# Patient Record
Sex: Female | Born: 1950
Health system: Southern US, Community
[De-identification: ages and names within clinical notes are randomized; demographics above are authoritative.]

## PROBLEM LIST (undated history)

## (undated) DIAGNOSIS — K529 Noninfective gastroenteritis and colitis, unspecified: Secondary | ICD-10-CM

## (undated) DIAGNOSIS — G4730 Sleep apnea, unspecified: Secondary | ICD-10-CM

## (undated) DIAGNOSIS — E079 Disorder of thyroid, unspecified: Secondary | ICD-10-CM

## (undated) DIAGNOSIS — K589 Irritable bowel syndrome without diarrhea: Secondary | ICD-10-CM

## (undated) DIAGNOSIS — T4145XA Adverse effect of unspecified anesthetic, initial encounter: Secondary | ICD-10-CM

## (undated) DIAGNOSIS — K219 Gastro-esophageal reflux disease without esophagitis: Secondary | ICD-10-CM

## (undated) DIAGNOSIS — E119 Type 2 diabetes mellitus without complications: Secondary | ICD-10-CM

## (undated) DIAGNOSIS — F32A Depression, unspecified: Secondary | ICD-10-CM

## (undated) DIAGNOSIS — F419 Anxiety disorder, unspecified: Secondary | ICD-10-CM

## (undated) DIAGNOSIS — J45909 Unspecified asthma, uncomplicated: Secondary | ICD-10-CM

## (undated) DIAGNOSIS — M542 Cervicalgia: Secondary | ICD-10-CM

## (undated) DIAGNOSIS — Z9889 Other specified postprocedural states: Secondary | ICD-10-CM

## (undated) DIAGNOSIS — R51 Headache: Secondary | ICD-10-CM

## (undated) DIAGNOSIS — G2581 Restless legs syndrome: Secondary | ICD-10-CM

## (undated) DIAGNOSIS — M1990 Unspecified osteoarthritis, unspecified site: Secondary | ICD-10-CM

## (undated) DIAGNOSIS — T8859XA Other complications of anesthesia, initial encounter: Secondary | ICD-10-CM

## (undated) DIAGNOSIS — G8929 Other chronic pain: Secondary | ICD-10-CM

## (undated) DIAGNOSIS — E785 Hyperlipidemia, unspecified: Secondary | ICD-10-CM

## (undated) DIAGNOSIS — I209 Angina pectoris, unspecified: Secondary | ICD-10-CM

## (undated) DIAGNOSIS — R112 Nausea with vomiting, unspecified: Secondary | ICD-10-CM

## (undated) DIAGNOSIS — M549 Dorsalgia, unspecified: Secondary | ICD-10-CM

## (undated) DIAGNOSIS — F329 Major depressive disorder, single episode, unspecified: Secondary | ICD-10-CM

## (undated) HISTORY — DX: Noninfective gastroenteritis and colitis, unspecified: K52.9

## (undated) HISTORY — PX: HERNIA REPAIR: SHX51

## (undated) HISTORY — PX: ABDOMINAL HYSTERECTOMY: SHX81

## (undated) HISTORY — PX: SPINE SURGERY: SHX786

## (undated) HISTORY — DX: Irritable bowel syndrome, unspecified: K58.9

## (undated) HISTORY — PX: DILATION AND CURETTAGE OF UTERUS: SHX78

## (undated) HISTORY — PX: BILATERAL CARPAL TUNNEL RELEASE: SHX6508

## (undated) HISTORY — PX: KNEE SURGERY: SHX244

## (undated) HISTORY — DX: Restless legs syndrome: G25.81

## (undated) HISTORY — PX: CHOLECYSTECTOMY: SHX55

## (undated) HISTORY — DX: Sleep apnea, unspecified: G47.30

## (undated) HISTORY — DX: Disorder of thyroid, unspecified: E07.9

## (undated) HISTORY — PX: CARDIAC CATHETERIZATION: SHX172

---

## 2004-10-16 ENCOUNTER — Ambulatory Visit: Payer: Self-pay | Admitting: Family Medicine

## 2004-12-19 ENCOUNTER — Inpatient Hospital Stay: Payer: Self-pay | Admitting: Internal Medicine

## 2004-12-19 ENCOUNTER — Other Ambulatory Visit: Payer: Self-pay

## 2005-07-04 ENCOUNTER — Ambulatory Visit: Payer: Self-pay | Admitting: Family Medicine

## 2005-11-13 ENCOUNTER — Emergency Department: Payer: Self-pay | Admitting: Emergency Medicine

## 2006-01-16 ENCOUNTER — Emergency Department: Payer: Self-pay | Admitting: Emergency Medicine

## 2006-09-07 ENCOUNTER — Emergency Department: Payer: Self-pay | Admitting: Emergency Medicine

## 2006-09-07 ENCOUNTER — Other Ambulatory Visit: Payer: Self-pay

## 2006-10-27 ENCOUNTER — Ambulatory Visit: Payer: Self-pay

## 2007-04-22 ENCOUNTER — Emergency Department: Payer: Self-pay | Admitting: Emergency Medicine

## 2007-05-21 ENCOUNTER — Ambulatory Visit: Payer: Self-pay | Admitting: Family Medicine

## 2007-06-03 ENCOUNTER — Ambulatory Visit: Payer: Self-pay | Admitting: Gastroenterology

## 2007-06-25 ENCOUNTER — Ambulatory Visit: Payer: Self-pay | Admitting: Unknown Physician Specialty

## 2008-04-27 ENCOUNTER — Ambulatory Visit: Payer: Self-pay | Admitting: Unknown Physician Specialty

## 2008-04-28 ENCOUNTER — Inpatient Hospital Stay: Payer: Self-pay | Admitting: Unknown Physician Specialty

## 2008-05-20 ENCOUNTER — Ambulatory Visit: Payer: Self-pay | Admitting: Unknown Physician Specialty

## 2008-06-14 ENCOUNTER — Emergency Department: Payer: Self-pay | Admitting: Emergency Medicine

## 2009-04-27 ENCOUNTER — Ambulatory Visit: Payer: Self-pay | Admitting: Family Medicine

## 2009-05-02 ENCOUNTER — Ambulatory Visit: Payer: Self-pay | Admitting: Family Medicine

## 2009-08-15 ENCOUNTER — Ambulatory Visit: Payer: Self-pay | Admitting: Unknown Physician Specialty

## 2009-08-21 ENCOUNTER — Ambulatory Visit: Payer: Self-pay | Admitting: Unknown Physician Specialty

## 2009-12-02 ENCOUNTER — Ambulatory Visit: Payer: Self-pay | Admitting: Unknown Physician Specialty

## 2009-12-08 ENCOUNTER — Ambulatory Visit: Payer: Self-pay | Admitting: Family Medicine

## 2010-03-18 LAB — HM COLONOSCOPY

## 2010-05-16 ENCOUNTER — Observation Stay: Payer: Self-pay | Admitting: Internal Medicine

## 2010-07-01 ENCOUNTER — Emergency Department: Payer: Self-pay | Admitting: Emergency Medicine

## 2010-07-16 ENCOUNTER — Emergency Department: Payer: Self-pay | Admitting: Emergency Medicine

## 2010-11-07 ENCOUNTER — Other Ambulatory Visit: Payer: Self-pay | Admitting: Orthopedic Surgery

## 2010-11-07 ENCOUNTER — Other Ambulatory Visit (HOSPITAL_COMMUNITY): Payer: Self-pay | Admitting: Orthopedic Surgery

## 2010-11-07 ENCOUNTER — Encounter (HOSPITAL_COMMUNITY)

## 2010-11-07 ENCOUNTER — Ambulatory Visit (HOSPITAL_COMMUNITY)
Admission: RE | Admit: 2010-11-07 | Discharge: 2010-11-07 | Disposition: A | Discharge status: Home / Self Care | Source: Ambulatory Visit | Attending: Orthopedic Surgery | Admitting: Orthopedic Surgery

## 2010-11-07 DIAGNOSIS — Z01811 Encounter for preprocedural respiratory examination: Secondary | ICD-10-CM | POA: Insufficient documentation

## 2010-11-07 DIAGNOSIS — Z01812 Encounter for preprocedural laboratory examination: Secondary | ICD-10-CM | POA: Insufficient documentation

## 2010-11-07 DIAGNOSIS — Z01810 Encounter for preprocedural cardiovascular examination: Secondary | ICD-10-CM | POA: Insufficient documentation

## 2010-11-07 DIAGNOSIS — Z01818 Encounter for other preprocedural examination: Secondary | ICD-10-CM

## 2010-11-07 LAB — CBC
HCT: 37.9 % (ref 36.0–46.0)
MCHC: 32.7 g/dL (ref 30.0–36.0)
MCV: 80.3 fL (ref 78.0–100.0)
Platelets: 219 10*3/uL (ref 150–400)
RDW: 15.8 % — ABNORMAL HIGH (ref 11.5–15.5)
WBC: 10.1 10*3/uL (ref 4.0–10.5)

## 2010-11-07 LAB — BASIC METABOLIC PANEL
BUN: 19 mg/dL (ref 6–23)
Creatinine, Ser: 0.93 mg/dL (ref 0.50–1.10)
GFR calc Af Amer: >60 mL/min (ref >60)
GFR calc non Af Amer: >60 mL/min (ref >60)
Glucose, Bld: 241 mg/dL — ABNORMAL HIGH (ref 70–99)

## 2010-11-07 LAB — URINALYSIS, ROUTINE W REFLEX MICROSCOPIC
Hgb urine dipstick: NEGATIVE
Nitrite: NEGATIVE
Urobilinogen, UA: 0.2 mg/dL (ref 0.0–1.0)

## 2010-11-07 LAB — SURGICAL PCR SCREEN: MRSA, PCR: NEGATIVE

## 2010-11-07 LAB — DIFFERENTIAL
Eosinophils Absolute: 0.7 10*3/uL (ref 0.0–0.7)
Eosinophils Relative: 7 % — ABNORMAL HIGH (ref 0–5)
Lymphs Abs: 2.4 10*3/uL (ref 0.7–4.0)
Monocytes Absolute: 0.4 10*3/uL (ref 0.1–1.0)

## 2010-11-07 LAB — PROTIME-INR
INR: 0.93 (ref 0.00–1.49)
Prothrombin Time: 12.7 seconds (ref 11.6–15.2)

## 2010-11-07 LAB — URINE MICROSCOPIC-ADD ON

## 2010-11-07 LAB — ABO/RH: ABO/RH(D): A NEG

## 2010-11-12 ENCOUNTER — Inpatient Hospital Stay (HOSPITAL_COMMUNITY)
Admission: RE | Admit: 2010-11-12 | Discharge: 2010-11-15 | DRG: 467 | Disposition: A | Discharge status: Skilled Nursing Facility | Source: Ambulatory Visit | Attending: Orthopedic Surgery | Admitting: Orthopedic Surgery

## 2010-11-12 DIAGNOSIS — Z01812 Encounter for preprocedural laboratory examination: Secondary | ICD-10-CM

## 2010-11-12 DIAGNOSIS — F3289 Other specified depressive episodes: Secondary | ICD-10-CM | POA: Diagnosis present

## 2010-11-12 DIAGNOSIS — L299 Pruritus, unspecified: Secondary | ICD-10-CM | POA: Diagnosis not present

## 2010-11-12 DIAGNOSIS — T84099A Other mechanical complication of unspecified internal joint prosthesis, initial encounter: Principal | ICD-10-CM | POA: Diagnosis present

## 2010-11-12 DIAGNOSIS — Y831 Surgical operation with implant of artificial internal device as the cause of abnormal reaction of the patient, or of later complication, without mention of misadventure at the time of the procedure: Secondary | ICD-10-CM | POA: Diagnosis present

## 2010-11-12 DIAGNOSIS — I10 Essential (primary) hypertension: Secondary | ICD-10-CM | POA: Diagnosis present

## 2010-11-12 DIAGNOSIS — Z6841 Body Mass Index (BMI) 40.0 and over, adult: Secondary | ICD-10-CM

## 2010-11-12 DIAGNOSIS — Z96659 Presence of unspecified artificial knee joint: Secondary | ICD-10-CM

## 2010-11-12 DIAGNOSIS — M1710 Unilateral primary osteoarthritis, unspecified knee: Secondary | ICD-10-CM | POA: Diagnosis present

## 2010-11-12 DIAGNOSIS — F329 Major depressive disorder, single episode, unspecified: Secondary | ICD-10-CM | POA: Diagnosis present

## 2010-11-12 DIAGNOSIS — K219 Gastro-esophageal reflux disease without esophagitis: Secondary | ICD-10-CM | POA: Diagnosis present

## 2010-11-12 DIAGNOSIS — E119 Type 2 diabetes mellitus without complications: Secondary | ICD-10-CM | POA: Diagnosis present

## 2010-11-12 LAB — GLUCOSE, CAPILLARY
Glucose-Capillary: 167 mg/dL — ABNORMAL HIGH (ref 70–99)
Glucose-Capillary: 164 mg/dL — ABNORMAL HIGH (ref 70–99)
Glucose-Capillary: 200 mg/dL — ABNORMAL HIGH (ref 70–99)
Glucose-Capillary: 136 mg/dL — ABNORMAL HIGH (ref 70–99)

## 2010-11-12 LAB — TYPE AND SCREEN
ABO/RH(D): A NEG
Antibody Screen: NEGATIVE

## 2010-11-13 LAB — CBC
Hemoglobin: 10.5 g/dL — ABNORMAL LOW (ref 12.0–15.0)
MCH: 26.3 pg (ref 26.0–34.0)
MCHC: 32.1 g/dL (ref 30.0–36.0)
RDW: 15.9 % — ABNORMAL HIGH (ref 11.5–15.5)

## 2010-11-13 LAB — GLUCOSE, CAPILLARY: Glucose-Capillary: 156 mg/dL — ABNORMAL HIGH (ref 70–99)

## 2010-11-13 LAB — BASIC METABOLIC PANEL
BUN: 16 mg/dL (ref 6–23)
Calcium: 8.8 mg/dL (ref 8.4–10.5)
GFR calc Af Amer: >60 mL/min (ref >60)
GFR calc non Af Amer: 54 mL/min — ABNORMAL LOW (ref >60)
Glucose, Bld: 157 mg/dL — ABNORMAL HIGH (ref 70–99)
Sodium: 137 mEq/L (ref 135–145)

## 2010-11-14 LAB — BASIC METABOLIC PANEL
BUN: 12 mg/dL (ref 6–23)
CO2: 32 mEq/L (ref 19–32)
Calcium: 9 mg/dL (ref 8.4–10.5)
Chloride: 100 mEq/L (ref 96–112)
Creatinine, Ser: 1.02 mg/dL (ref 0.50–1.10)
GFR calc Af Amer: >60 mL/min (ref >60)
GFR calc non Af Amer: 55 mL/min — ABNORMAL LOW (ref >60)
Glucose, Bld: 74 mg/dL (ref 70–99)
Potassium: 4 mEq/L (ref 3.5–5.1)
Sodium: 139 mEq/L (ref 135–145)

## 2010-11-14 LAB — CBC
HCT: 33.2 % — ABNORMAL LOW (ref 36.0–46.0)
Hemoglobin: 10.6 g/dL — ABNORMAL LOW (ref 12.0–15.0)
MCH: 26.3 pg (ref 26.0–34.0)
MCHC: 31.9 g/dL (ref 30.0–36.0)
MCV: 82.4 fL (ref 78.0–100.0)
Platelets: 238 10*3/uL (ref 150–400)
RBC: 4.03 MIL/uL (ref 3.87–5.11)
RDW: 15.9 % — ABNORMAL HIGH (ref 11.5–15.5)
WBC: 13.8 10*3/uL — ABNORMAL HIGH (ref 4.0–10.5)

## 2010-11-15 LAB — GLUCOSE, CAPILLARY
Glucose-Capillary: 70 mg/dL (ref 70–99)
Glucose-Capillary: 72 mg/dL (ref 70–99)

## 2010-11-16 NOTE — Discharge Summary (Signed)
Alyssa Perkins, Alyssa Perkins           ACCOUNT NO.:  [id number]  MEDICAL RECORD NO.:  [id number]  LOCATION:  1620                         FACILITY:  [facility]  PHYSICIAN:  Madlyn Frankel. Charlann Boxer, M.D.  DATE OF BIRTH:  1950/12/27  DATE OF ADMISSION:  11/12/2010 DATE OF DISCHARGE:  11/15/2010                        DISCHARGE SUMMARY - REFERRING   PROCEDURE:  Conversion of the left unilateral knee arthroplasty to a left total knee arthroplasty.  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  DISCHARGE DIAGNOSES: 1. Status post conversion of left unilateral knee arthroplasty to a     total knee arthroplasty. 2. Diabetes. 3. High blood pressure. 4. Gastroesophageal reflux disease. 5. Depression.  HISTORY OF PRESENT ILLNESS:  The patient is a 60 year old lady in no acute distress but the patient states that in February 2010 she had unilateral knee replacement done by Dr. Gavin Potters.  Since that time, the patient states that she has actually had more pain after surgery was done prior to.  The patient had seen Dr. Charlann Boxer in the clinic, various options were discussed.  The patient wishes to proceed with surgery. Risks, benefits, and expectations of the procedure were discussed with the patient.  The patient understands the risks, benefits, and expectations and wishes to proceed with conversion of the left unilateral knee arthroplasty to a total knee arthroplasty on November 12, 2010.  HOSPITAL COURSE:  The patient underwent the above stated procedure on November 12, 2010.  The patient tolerated the procedure well, was brought to the recovery room in good condition and subsequently to the floor.  Postop day #1, November 13, 2010, the patient was doing well, was complaining of some itching.  She was placed on Vistaril, which is helping.  The patient is afebrile, vital signs are stable, hemoglobin and hematocrit is 10.5 and 32.7.  Dressings are clean, dry, and intact. She is  distally and neurovascularly intact.  Hemovac was removed and the patient had physical therapy.  Postop day #2, November 14, 2010, the patient doing well complaining of less itching than prior.  The medicine was helping.  She was afebrile, vital signs stable, hemoglobin and hematocrit 10.6 and 33.2.  Dressing is good, clean, dry, and intact.  She is distally and neurovascularly in tact.  The patient again had physical therapy.  Postop day #3, November 15, 2010, the patient doing well, no events.  Pain was well-controlled, afebrile, vital signs stable.  No new labs were done.  The dressing is still good, clean, dry, and intact.  She is distally and neurovascularly intact.  The patient had physical therapy and was felt to be doing well enough to be discharged to a skilled nursing facility.  DISCHARGE CONDITION:  Good.  DISCHARGE INSTRUCTIONS:  The patient will be discharged to a skilled nursing facility today after physical therapy.  The patient will be weightbearing as tolerated.  She is to maintain a surgical dressing for 7 days after which time she will replace the dressing with gauze and tape.  This area should be kept dry and clean until follow up.  The patient follow up with Dr. Charlann Boxer, [facility] in 2 weeks.  The patient is to call with any questions or  concerns.  DISCHARGE MEDICATIONS: 1. Aspirin enteric-coated 325 mg 1 p.o. b.i.d. for 4 weeks. 2. Benadryl 25 mg 1 p.o. q.4 h. p.r.n. 3. Colace 100 mg 1 p.o. b.i.d., constipation. 4. Iron sulfate 325 mg 1 p.o. t.i.d. for 2-3 weeks. 5. Norco 7.5/325 one to two p.o. q.4-6 h p.r.n. pain. 6. Robaxin 500 mg 1 p.o. q.6 h. p.r.n. pain. 7. MiraLax 17 g 1 p.o. b.i.d., constipation. 8. Glipizide 10 mg 2 p.o. b.i.d. 9. Hydroxyzine 25 mg 1 p.o. p.r.n. itching. 10.Lantus 55 units subcu b.i.d. 11.Omeprazole 20 mg 1 p.o. q.a.m. 12.Quinapril 20 mg 1 p.o. q.a.m. 13.Sertraline 100 mg 1 p.o. q.a.m. 14.Simvastatin 40 mg 1 p.o.  q.h.s.    ______________________________ Lanney Gins, PA   ______________________________ Madlyn Frankel. Charlann Boxer, M.D.    MB/MEDQ  D:  11/15/2010  T:  11/15/2010  Job:  [id number]  Electronically Signed by Lanney Gins PA on 11/15/2010 11:26:30 AM Electronically Signed by Durene Romans M.D. on 11/16/2010 09:03:58 AM

## 2010-11-16 NOTE — Op Note (Signed)
NAMETASHONDA, PINKUS           ACCOUNT NO.:  [id number]  MEDICAL RECORD NO.:  [id number]  LOCATION:  1620                         FACILITY:  [facility]  PHYSICIAN:  Madlyn Frankel. Charlann Boxer, M.D.  DATE OF BIRTH:  06/25/1950  DATE OF PROCEDURE:  11/12/2010 DATE OF DISCHARGE:                              OPERATIVE REPORT   PREOPERATIVE DIAGNOSIS:  Failed left partial knee replacement aseptically with probable progression of degenerative changes in other parts of the knee.  POSTOPERATIVE DIAGNOSIS:  Failed left partial knee replacement aseptically with probable progression of degenerative changes in other parts of the knee.  PROCEDURE:  Conversion of previous left partial knee replacement to a total knee replacement revision utilizing DePuy component size 2 femoral component size 2.5 MBT revision tray, 12.5 insert, 35 patellar button.  SURGEON:  Madlyn Frankel. Charlann Boxer, M.D.  ASSISTANT:  Lanney Gins, PA  ANESTHESIA:  General plus a preoperative regional femoral nerve block.  SPECIMENS:  None.  DRAINS:  One Hemovac.  COMPLICATIONS:  None.  TOURNIQUET TIME:  44 minutes 250 mmHg.  ESTIMATED BLOOD LOSS:  Minimal.  INDICATIONS FOR PROCEDURE:  Ms. Elizarraraz is a 60 year old female who presented office for second opinion evaluation of the left knee.  She had a partial knee replacement done in February 2010, with persistent pain.  She had no wound complications.  Workup was negative for infection.  She had failed conservative measures including injections. Prior to my evaluation, she had significant reduction in overall quality of life  and wished to have her knee converted to a total knee replacement.  Risks and benefits were discussed including persistent pain, progression of her infection, DVT, need for revision surgery as well as a postoperative course and expectations.  Consent was obtained for benefit of pain relief.  PROCEDURE IN DETAIL:  The patient was brought to operative  theater. Once adequate anesthesia, preoperative antibiotics, Ancef were administered, she was positioned in supine with left thigh tourniquet placed.  Left lower extremity was then prepped and draped in sterile fashion with left foot placed in the [facility] leg holder.  A time-out was performed identifying the patient, planned procedure and extremity.  The patient's old medial based incision was used and extended slightly distally and then proximally above the patella.  Soft tissue plane was created, a median arthrotomy then made.  Following initial debridement of the proximal medial peel and exposure of the knee, attention was first directed to the patella.  Precut measurement was 22 mm, resected down to about 14 to 15 mm using 35 patellar button to restore height and cover the cut surface.  Lug holes were drilled.  A metal shim was placed to protect the patella from retractors and saw blades.  Attention was now directed to the femur.  Femoral canal was opened, drilled, irrigated to try to prevent fat emboli.  An intramedullary rod was passed and at 3 degrees of valgus resected 11 mm bone off the distal femur.  The lateral bone resected easily.  The medial bone resected around the prosthesis, then used an osteotome to remove the femoral component.  I then finished off this distal medial cut.  There was no significant bone loss in the distal aspect, posterior  defect noted.  At this point, the tibia was subluxated anteriorly and using extramedullary guide a measured resection of 10 mm of the proximal lateral tibia was carried out, this appeared to be just underneath the previously placed component.  Once this cut was made, the old component and bone were removed with only a residual amount of cement into the tibia noted.  This cut was confirmed to be perpendicular in the coronal plane and the gap was stable with at least a 10 mm insert medially and laterally.  At this point, I sized the  femur and initially it was a size 2.5 in the anterior and posterior dimension.  The rotation block was pinned in position, anterior reference using the C clamp set rotation.  Initially, the anterior and posterior chamfer cuts were made without difficulty, no notching.  No augments necessary.  The final box cut was made off the lateral aspect of the distal femur.  Once this was done, the tibia was subluxated again anteriorly.  The MBT revision tibial tray was pinned into position and drilled and then keel punched.  I did use an oscillating saw to help create the area of the keel to prevent fracture on the sclerotic bone medially.  Trial reduction was now carried out with 2.5 femur, 2.5 tibia, MBT revision tray and a 12.5 insert.  With this the knee came to full extension, was stable from extension to flexion.  I was a little bit worried about the overhang on the lateral side of the femur, based on the narrowness of her metaphyseal region. This downsized to a size 2.  The size 2 block was then pinned into position.  Using the joint to set the anterior reference I then dropped it down 2 mm to balance off the cut of the anterior and posterior. There was no evidence of any notching.  The chamfer cuts were revisited.  At this point, final components were opened, the cement was mixed.  I drilled the sclerotic bone on the medial side to accept more cement and digitation.  The knee was irrigated with normal saline solution pulse lavage.  The final components were cemented into position.  Using the 12.5 insert the knee was brought to extension and extruded cement was removed.  Once the cement had fully cured, excessive cement was removed throughout the knee, the final 12.5 insert was chosen and placed.  The knee came to full extension with excellent flexion.  The knee was re- irrigated, tourniquet had been let down after 44 minutes.  A medium Hemovac drain was placed deep.  The extensor mechanism  was then reapproximated with the knee in flexion using #1 Vicryl.  The remaining wound was closed with 2-0 Vicryl and running 4-0 Monocryl.  The knee was cleaned, dried, dressed sterilely using Dermabond and Aquacel dressing.  Drain site was dressed separately.  The patient was then brought to recovery room in stable condition tolerating the procedure well.     Madlyn Frankel Charlann Boxer, M.D.     MDO/MEDQ  D:  11/12/2010  T:  11/12/2010  Job:  [id number]  Electronically Signed by Durene Romans M.D. on 11/16/2010 09:03:54 AM

## 2010-11-21 LAB — GLUCOSE, CAPILLARY: Glucose-Capillary: 108 mg/dL — ABNORMAL HIGH (ref 70–99)

## 2011-06-25 ENCOUNTER — Other Ambulatory Visit: Payer: Self-pay | Admitting: Physical Medicine and Rehabilitation

## 2011-06-25 DIAGNOSIS — M1990 Unspecified osteoarthritis, unspecified site: Secondary | ICD-10-CM

## 2011-06-30 ENCOUNTER — Ambulatory Visit
Admission: RE | Admit: 2011-06-30 | Discharge: 2011-06-30 | Disposition: A | Discharge status: Home / Self Care | Source: Ambulatory Visit | Attending: Physical Medicine and Rehabilitation | Admitting: Physical Medicine and Rehabilitation

## 2011-06-30 DIAGNOSIS — M1990 Unspecified osteoarthritis, unspecified site: Secondary | ICD-10-CM

## 2011-12-05 ENCOUNTER — Emergency Department: Payer: Self-pay | Admitting: Emergency Medicine

## 2011-12-24 ENCOUNTER — Encounter (HOSPITAL_COMMUNITY): Payer: Self-pay | Admitting: Pharmacy Technician

## 2011-12-30 ENCOUNTER — Encounter (HOSPITAL_COMMUNITY)
Admission: RE | Admit: 2011-12-30 | Discharge: 2011-12-30 | Disposition: A | Discharge status: Home / Self Care | Source: Ambulatory Visit | Attending: Orthopedic Surgery | Admitting: Orthopedic Surgery

## 2011-12-30 ENCOUNTER — Ambulatory Visit (HOSPITAL_COMMUNITY)
Admission: RE | Admit: 2011-12-30 | Discharge: 2011-12-30 | Disposition: A | Discharge status: Home / Self Care | Source: Ambulatory Visit | Attending: Orthopedic Surgery | Admitting: Orthopedic Surgery

## 2011-12-30 ENCOUNTER — Encounter (HOSPITAL_COMMUNITY): Payer: Self-pay

## 2011-12-30 DIAGNOSIS — J45909 Unspecified asthma, uncomplicated: Secondary | ICD-10-CM | POA: Insufficient documentation

## 2011-12-30 DIAGNOSIS — Z01818 Encounter for other preprocedural examination: Secondary | ICD-10-CM | POA: Insufficient documentation

## 2011-12-30 DIAGNOSIS — M5030 Other cervical disc degeneration, unspecified cervical region: Secondary | ICD-10-CM | POA: Insufficient documentation

## 2011-12-30 DIAGNOSIS — Z01812 Encounter for preprocedural laboratory examination: Secondary | ICD-10-CM | POA: Insufficient documentation

## 2011-12-30 DIAGNOSIS — Z01810 Encounter for preprocedural cardiovascular examination: Secondary | ICD-10-CM | POA: Insufficient documentation

## 2011-12-30 DIAGNOSIS — M5412 Radiculopathy, cervical region: Secondary | ICD-10-CM | POA: Diagnosis present

## 2011-12-30 HISTORY — DX: Major depressive disorder, single episode, unspecified: F32.9

## 2011-12-30 HISTORY — DX: Unspecified asthma, uncomplicated: J45.909

## 2011-12-30 HISTORY — DX: Adverse effect of unspecified anesthetic, initial encounter: T41.45XA

## 2011-12-30 HISTORY — DX: Hyperlipidemia, unspecified: E78.5

## 2011-12-30 HISTORY — DX: Angina pectoris, unspecified: I20.9

## 2011-12-30 HISTORY — DX: Nausea with vomiting, unspecified: R11.2

## 2011-12-30 HISTORY — DX: Other specified postprocedural states: Z98.890

## 2011-12-30 HISTORY — DX: Gastro-esophageal reflux disease without esophagitis: K21.9

## 2011-12-30 HISTORY — DX: Anxiety disorder, unspecified: F41.9

## 2011-12-30 HISTORY — DX: Unspecified osteoarthritis, unspecified site: M19.90

## 2011-12-30 HISTORY — DX: Cervicalgia: M54.2

## 2011-12-30 HISTORY — DX: Other chronic pain: G89.29

## 2011-12-30 HISTORY — DX: Dorsalgia, unspecified: M54.9

## 2011-12-30 HISTORY — DX: Type 2 diabetes mellitus without complications: E11.9

## 2011-12-30 HISTORY — DX: Other complications of anesthesia, initial encounter: T88.59XA

## 2011-12-30 HISTORY — DX: Headache: R51

## 2011-12-30 HISTORY — DX: Depression, unspecified: F32.A

## 2011-12-30 LAB — CBC
HCT: 38 % (ref 36.0–46.0)
Hemoglobin: 12.3 g/dL (ref 12.0–15.0)
MCH: 25.7 pg — ABNORMAL LOW (ref 26.0–34.0)
MCHC: 32.4 g/dL (ref 30.0–36.0)
MCV: 79.3 fL (ref 78.0–100.0)

## 2011-12-30 LAB — BASIC METABOLIC PANEL
BUN: 17 mg/dL (ref 6–23)
Chloride: 102 mEq/L (ref 96–112)
Glucose, Bld: 219 mg/dL — ABNORMAL HIGH (ref 70–99)
Potassium: 4.2 mEq/L (ref 3.5–5.1)

## 2011-12-30 NOTE — Pre-Procedure Instructions (Signed)
[address] Rody Suderman  12/30/2011   Your procedure is scheduled on:  Thursday January 02, 2012  Report to [facility] at 10:30AM.  Call this number if you have problems the morning of surgery: [phone]   Remember:   Do not eat food or drink :After Midnight.      Take these medicines the morning of surgery with A SIP OF WATER: hydroxyzine, omeprazole   Do not wear jewelry, make-up or nail polish.  Do not wear lotions, powders, or perfumes.  Do not shave 48 hours prior to surgery. Men may shave face and neck.  Do not bring valuables to the hospital.  Contacts, dentures or bridgework may not be worn into surgery.  Leave suitcase in the car. After surgery it may be brought to your room.  For patients admitted to the hospital, checkout time is 11:00 AM the day of discharge.   Patients discharged the day of surgery will not be allowed to drive home.  Name and phone number of your driver: family / friend  Special Instructions: Shower using CHG 2 nights before surgery and the night before surgery.  If you shower the day of surgery use CHG.  Use special wash - you have one bottle of CHG for all showers.  You should use approximately 1/3 of the bottle for each shower.   Please read over the following fact sheets that you were given: Pain Booklet, Coughing and Deep Breathing, MRSA Information and Surgical Site Infection Prevention

## 2011-12-30 NOTE — H&P (Signed)
Alyssa Perkins 12/30/2011 8:21 AM Location: [facility] Patient #: [id number] DOB: 12/05/1950 Single / Language: [place] / Race: White Female   History of Present Illness(Randie Bloodgood J Marcelline Mates, PA-C; 12/30/2011 8:39 AM) The patient is a 61 year old female who comes in today for a preoperative History and Physical. The patient is scheduled for a ACDF C5-6, C6-7 (for cervical spondylotic radiculopathy) to be performed by Dr. Debria Garret D. Shon Baton, MD at [facility] on Thursday, January 02, 2012 at 1230 . Please see the hospital record for complete dictated history and physical.    Problem List/Past Medical(Rutha Melgoza J [facility], PA-C; 12/30/2011 8:40 AM) Cervical pain (723.1) Osteoarthritis, Lumbar (715.98) Radiculitis, Thoracic or Lumbar (724.4) Status post revision of Left total knee replacement (V43.65)   Allergies(Wylee Dorantes J Idora Brosious, PA-C; 12/30/2011 8:40 AM) Local Anesthetics (Amide - Procaine) No Known Drug Allergies. 09/20/2011   Family History(Izora Benn J Alexea Blase, PA-C; 12/30/2011 8:40 AM) Cerebrovascular Accident. grandmother fathers side Cancer. mother and brother Congestive Heart Failure. mother and brother Heart Disease. brother and grandmother mothers side Diabetes Mellitus. sister, brother and grandmother fathers side Heart disease in female family member before age 4 Hypertension. sister, brother and grandmother fathers side Heart disease in female family member before age 20   Social History(Jamail Cullers J [facility], PA-C; 12/30/2011 8:40 AM) Alcohol use. never consumed alcohol Children. 4 Drug/Alcohol Rehab (Currently). no Current work status. unemployed Pain Contract. no Exercise. Exercises weekly; does running / walking Drug/Alcohol Rehab (Previously). no Illicit drug use. no Marital status. married Living situation. live with spouse Tobacco use. Never smoker. former smoker; smoke(d) 1/2 pack(s) per day No alcohol use   Medication  History(Kaetlyn Noa J Theressa Piedra, PA-C; 12/30/2011 11:36 AM) Meloxicam (7.5MG  Tablet, 1 Oral two times daily, as needed, Taken starting 11/28/2010) Active. HydrOXYzine HCl (25MG  Tablet, 1 Oral prn) Active. GlipiZIDE (10MG  Tablet, 1 Oral daily) Active. Lantus (100UNIT/ML Solution, 60 units Subcutaneous BID) Active. Omeprazole (40MG  Capsule ER, 1 Oral daily) Active. Simvastatin ( Oral) Specific dose unknown - Active. Quinapril HCl (10MG  Tablet, 1 Oral daily) Active.   Pregnancy / Birth History(Nikalas Bramel J [facility], PA-C; 12/30/2011 8:40 AM) Pregnant. no   Past Surgical History(Marquel Pottenger J [facility], PA-C; 12/30/2011 8:40 AM) Hysterectomy. complete (cancerous) Gallbladder Surgery. laporoscopic Total Knee Replacement. left Carpal Tunnel Repair. bilateral Arthroscopy of Knee. left   Other Problems(Talley Kreiser J Jeffie Widdowson, PA-C; 12/30/2011 8:40 AM) Peripheral Neuropathy Osteoporosis Irritable bowel syndrome Hypercholesterolemia Migraine Headache Osteoarthritis Oophorectomy. bilateral High blood pressure Asthma Allergic Urticaria Chronic Pain Gastroesophageal Reflux Disease Diabetes Mellitus, Type II   Review of Systems(Anthone Prieur J Bruna Dills, PA-C; 12/30/2011 8:40 AM) General:Present- Fatigue. Not Present- Chills, Fever, Night Sweats, Appetite Loss, Feeling sick, Weight Gain and Weight Loss. Skin:Present- Itching and Psoriasis. Not Present- Rash, Skin Color Changes, Ulcer and Change in Hair or Nails. HEENT:Present- Sensitivity to light and Hearing problems. Not Present- Nose Bleed and Ringing in the Ears. Respiratory:Present- Snoring. Not Present- Chronic Cough, Bloody sputum and Dyspnea. Cardiovascular:Present- Swelling of Extremities and Leg Cramps. Not Present- Shortness of Breath, Chest Pain and Palpitations. Gastrointestinal:Present- Heartburn and Incontinence of Stool. Not Present- Bloody Stool, Abdominal Pain, Vomiting and Nausea. Female Genitourinary:Present- Frequency. Not Present-  Blood in Urine, Menstrual Irregularities, Incontinence and Nocturia. Musculoskeletal:Present- Muscle Pain, Joint Swelling, Joint Pain and Back Pain. Not Present- Muscle Weakness and Joint Stiffness. Neurological:Present- Tingling, Numbness, Burning and Headaches. Not Present- Tremor and Dizziness. Psychiatric:Present- Memory Loss. Not Present- Anxiety and Depression. Endocrine:Present- Heat Intolerance, Excessive hunger and Excessive Thirst. Not Present- Cold Intolerance. Hematology:Not Present- Abnormal Bleeding, Anemia, Blood  Clots and Easy Bruising.   Vitals(Lori W Lamb; 12/30/2011 10:34 AM) 12/30/2011 10:28 AM Weight: 231 lb Height: 63.5 in Body Surface Area: 2.17 m Body Mass Index: 40.28 kg/m Pulse: 81 (Regular) BP: 132/78 (Sitting, Left Arm, Standard)    Physical Exam([place] J Emily Forse, PA-C; 12/30/2011 11:55 AM) The physical exam findings are as follows:  Note: She is alert and oriented in NAD. She ambulates without assistance with a normal gait pattern. She is able to heel-walk, toe-walk and heel-toe-walk without difficulty. On visual inspection there is no obvious deformity or rash. Neck is supple without lymphadenopathy. Heart and lungs are clear to auscultation. Abdomen is soft and without tenderness. Positive bowel sounds. She has decreased and painful ROM of the cervical spine. Strength is equal in the bilateral UE. DTR reflexes are symmetric. Sensation and peripheral pulses are intact. Negative Babinski. Negative Hoffman's. No shoulder pain with joint ROM.   Assessment & Plan(Bodi Palmeri J [facility], PA-C; 12/30/2011 11:46 AM) Cervical pain (723.1)  Note: Unfortunately conservative measures consisting of observation, activity modification, physical therapy, oral pain medications and injections have failed to alleviate her symptoms and given the ongoing nature of her pain and the significant decrease in her quality of life, she wishes to proceed with  surgery. Risks/benefits/alternatives to the procedure/expectations following the procedure have been reviewed with the patient by Dr. Shon Baton. She understands that the goal is to reduce not eliminate her pain. She understands.  MRI of the cervical spine dated 11/14/11 demonstrates C5-6: mild flattening of the ventral thecal sac and minimal flattening of the ventral cord C6-7: central, right paracentral and right lateral recess disc extrusion. Mild to moderate flattening of the ventral thecal sac and mild flattening of the ventral cord. Mild central canal stenosis. Moderate to severe right foraminal stenosis. Please see the report for the remaining specifics.   She has been medically cleared for the procedure by Dr. Marlana Salvage (please see the scanned documentation in the patients office chart). She has been fitted for an ASPEN collar and she knows to bring this with her the morning of surgery. All of her questions have been encouraged, addressed and answered. Plan, at this time is to proceed with surgery as scheduled.   Signed electronically by Gwinda Maine, PA-C (12/30/2011 11:56 AM)   Alyssa Perkins 12/20/2011 9:08 AM Location: [facility] Patient #: [id number] DOB: 05-Apr-1950 Single / Language: [place] / Race: White Female   History of Present Illness(Sharon Gillian Shields; 12/20/2011 9:10 AM) The patient is a 61 year old female who presents today for follow up of their neck. The patient is being followed for their right-sided neck pain. The patient feels that they are doing poorly and report their pain level to be 6 / 10. The following medication has been used for pain control: Ultram. The patient presents today following ESI. The patient has not gotten any relief of their symptoms with Cortisone injections.    Subjective Transcription(DAHARI Sheela Stack, MD; 12/23/2011 3:53 PM)  Alyssa Perkins returns today for a followup. She states that after the cervical injection  she had about four days of significant improvement in her neck and right arm (C7) radicular pain. However, after the injection she started to notice significant low back, right buttock and right leg pain. That pain got so severe that she went to the emergency room at [facility] and was given pain injections. She states the neck and arm pain have returned, the low back and buttock pain has somewhat improved but is still an  issue. At this point she has also noticed she is having symptoms on the left side. She describes this somewhat more on the anterior biceps and in the flexor surface of the forearm. That has gotten significantly worse since the last time I saw her.      At this point in time, after reviewing the MRI I am concerned the 5-6 level, which has a left sided hard disk osteophyte may now be symptomatic. The patient has expressed a desire to have both levels addressed.    Allergies(Sharon Gillian Shields; 12/20/2011 9:11 AM) No Known Drug Allergies. 09/20/2011 Local Anesthetics (Amide - Procaine)   Social History(Sharon Gillian Shields; 12/20/2011 9:11 AM) Tobacco use. Never smoker. former smoker; smoke(d) 1/2 pack(s) per day   Medication History(Sharon Gillian Shields; 12/20/2011 9:11 AM) TraMADol HCl (50MG  Tablet, 1 (one) Tablet Oral q 8hrs prn pain, Taken starting 11/22/2011) Active. Valium (5MG  Tablet, 1 Tablet Oral daily, Taken starting 09/20/2011) Active. (take 1 tablet 1 hour prior to MRI; take second tablet 30 minutes prior to MRI) Lidoderm (5% Patch, External APPLY PATCH TO AREA FOR 12 HRS ON THEN OFF FOR 12 HRS, Taken starting 12/28/2010) Active. Meloxicam (7.5MG  Tablet, 1 Oral two times daily, as needed, Taken starting 11/28/2010) Active. Hydrocodone-Acetaminophen (5-325MG  Tablet, Oral) Active. Robaxin (500MG  Tablet, Oral) Active. Aspirin EC (325MG  Tablet DR, Oral) Active. HydrOXYzine HCl (10MG /5ML Syrup, Oral) Active. Sertraline HCl ( Oral) Specific dose unknown -  Active. GlipiZIDE ( Oral) Specific dose unknown - Active. HydrOXYzine HCl ( Oral) Specific dose unknown - Active. Lantus (100UNIT/ML Solution, Subcutaneous) Active. Omeprazole ( Oral) Specific dose unknown - Active. Quinapril HCl ( Oral) Specific dose unknown - Active. Simvastatin ( Oral) Specific dose unknown - Active.   Objective Transcription(DAHARI D BROOKS, MD; 12/23/2011 3:53 PM)  I do think the right arm pain, which is still in the C7 distribution needs to be addressed surgically. We had a long talk about the adjacent segment. Even though radiographically there is a bone spur, and it is causing some mild to moderate disease, clinically, she is indicating to me that it is a significant issue.    Assessment & Plan(DAHARI D BROOKS, MD; 12/20/2011 9:43 AM) Cervical pain (723.1)   Plans Transcription(DAHARI D BROOKS, MD; 12/23/2011 3:53 PM)  Given the fact that she is having worsening clinical findings consistent with left C6 radiculopathy, I do not think it is unreasonable to proceed with an ACDF of both levels. I have reviewed the risks with the patient and her sister. These include infection, bleeding, nerve damage, death, stroke, paralysis ongoing or worsening pain. The level above can break down and require more surgery. Throat pain, swallowing difficulties and hoarseness in her voice were discussed. She has expressed an understanding of the risks. She is aware that the goal of surgery is reduction and not elimination of her pain and improvement in her quality of life.    We will plan on proceeding with a 2-level ACDF in the very near future at her request.    The patient was present for the dictation.    Miscellaneous Transcription(DAHARI Sheela Stack, MD; 12/23/2011 3:53 PM)  Debria Garret D. Shon Baton, MD/jgc    T: 12-23-11  D: 12-20-11    Signed electronically by Alvy Beal, MD (12/23/2011 11:32 AM)

## 2012-01-01 MED ORDER — CEFAZOLIN SODIUM-DEXTROSE 2-3 GM-% IV SOLR
2.0000 g | INTRAVENOUS | Status: AC
  Administered 2012-01-02: 2 g via INTRAVENOUS
  Filled 2012-01-01: qty 50

## 2012-01-01 MED ORDER — LACTATED RINGERS IV SOLN
INTRAVENOUS | Status: DC
  Administered 2012-01-02: 12:00:00 via INTRAVENOUS

## 2012-01-02 ENCOUNTER — Encounter (HOSPITAL_COMMUNITY)
Admission: RE | Disposition: A | Discharge status: Home / Self Care | Payer: Self-pay | Source: Ambulatory Visit | Attending: Orthopedic Surgery

## 2012-01-02 ENCOUNTER — Inpatient Hospital Stay (HOSPITAL_COMMUNITY)
Admission: RE | Admit: 2012-01-02 | Discharge: 2012-01-03 | DRG: 473 | Disposition: A | Discharge status: Home / Self Care | Source: Ambulatory Visit | Attending: Orthopedic Surgery | Admitting: Orthopedic Surgery

## 2012-01-02 ENCOUNTER — Encounter (HOSPITAL_COMMUNITY): Payer: Self-pay | Admitting: *Deleted

## 2012-01-02 ENCOUNTER — Ambulatory Visit (HOSPITAL_COMMUNITY): Admitting: Anesthesiology

## 2012-01-02 ENCOUNTER — Encounter (HOSPITAL_COMMUNITY): Payer: Self-pay | Admitting: Anesthesiology

## 2012-01-02 ENCOUNTER — Inpatient Hospital Stay (HOSPITAL_COMMUNITY)

## 2012-01-02 ENCOUNTER — Ambulatory Visit (HOSPITAL_COMMUNITY)

## 2012-01-02 DIAGNOSIS — E785 Hyperlipidemia, unspecified: Secondary | ICD-10-CM | POA: Diagnosis present

## 2012-01-02 DIAGNOSIS — Z794 Long term (current) use of insulin: Secondary | ICD-10-CM

## 2012-01-02 DIAGNOSIS — K589 Irritable bowel syndrome without diarrhea: Secondary | ICD-10-CM | POA: Diagnosis present

## 2012-01-02 DIAGNOSIS — I2510 Atherosclerotic heart disease of native coronary artery without angina pectoris: Secondary | ICD-10-CM | POA: Diagnosis present

## 2012-01-02 DIAGNOSIS — F3289 Other specified depressive episodes: Secondary | ICD-10-CM | POA: Diagnosis present

## 2012-01-02 DIAGNOSIS — M1990 Unspecified osteoarthritis, unspecified site: Secondary | ICD-10-CM | POA: Diagnosis present

## 2012-01-02 DIAGNOSIS — Z87891 Personal history of nicotine dependence: Secondary | ICD-10-CM

## 2012-01-02 DIAGNOSIS — M47812 Spondylosis without myelopathy or radiculopathy, cervical region: Principal | ICD-10-CM | POA: Diagnosis present

## 2012-01-02 DIAGNOSIS — M810 Age-related osteoporosis without current pathological fracture: Secondary | ICD-10-CM | POA: Diagnosis present

## 2012-01-02 DIAGNOSIS — Z96659 Presence of unspecified artificial knee joint: Secondary | ICD-10-CM

## 2012-01-02 DIAGNOSIS — Z79899 Other long term (current) drug therapy: Secondary | ICD-10-CM

## 2012-01-02 DIAGNOSIS — E7800 Pure hypercholesterolemia, unspecified: Secondary | ICD-10-CM | POA: Diagnosis present

## 2012-01-02 DIAGNOSIS — M5412 Radiculopathy, cervical region: Secondary | ICD-10-CM | POA: Diagnosis present

## 2012-01-02 DIAGNOSIS — F329 Major depressive disorder, single episode, unspecified: Secondary | ICD-10-CM | POA: Diagnosis present

## 2012-01-02 DIAGNOSIS — K219 Gastro-esophageal reflux disease without esophagitis: Secondary | ICD-10-CM | POA: Diagnosis present

## 2012-01-02 DIAGNOSIS — G609 Hereditary and idiopathic neuropathy, unspecified: Secondary | ICD-10-CM | POA: Diagnosis present

## 2012-01-02 DIAGNOSIS — E119 Type 2 diabetes mellitus without complications: Secondary | ICD-10-CM | POA: Diagnosis present

## 2012-01-02 DIAGNOSIS — J45909 Unspecified asthma, uncomplicated: Secondary | ICD-10-CM | POA: Diagnosis present

## 2012-01-02 HISTORY — PX: ANTERIOR CERVICAL DECOMP/DISCECTOMY FUSION: SHX1161

## 2012-01-02 LAB — GLUCOSE, CAPILLARY
Glucose-Capillary: 161 mg/dL — ABNORMAL HIGH (ref 70–99)
Glucose-Capillary: 200 mg/dL — ABNORMAL HIGH (ref 70–99)
Glucose-Capillary: 271 mg/dL — ABNORMAL HIGH (ref 70–99)

## 2012-01-02 SURGERY — ANTERIOR CERVICAL DECOMPRESSION/DISCECTOMY FUSION 2 LEVELS
Anesthesia: General | Site: Neck | Wound class: Clean

## 2012-01-02 MED ORDER — ACETAMINOPHEN 10 MG/ML IV SOLN
1000.0000 mg | Freq: Once | INTRAVENOUS | Status: DC
  Filled 2012-01-02: qty 100

## 2012-01-02 MED ORDER — THROMBIN 20000 UNITS EX SOLR
CUTANEOUS | Status: AC
  Filled 2012-01-02: qty 20000

## 2012-01-02 MED ORDER — INSULIN ASPART 100 UNIT/ML ~~LOC~~ SOLN
0.0000 [IU] | SUBCUTANEOUS | Status: DC
  Administered 2012-01-02: 8 [IU] via SUBCUTANEOUS
  Administered 2012-01-03 (×2): 2 [IU] via SUBCUTANEOUS
  Administered 2012-01-03: 5 [IU] via SUBCUTANEOUS

## 2012-01-02 MED ORDER — CEFAZOLIN SODIUM 1-5 GM-% IV SOLN
1.0000 g | Freq: Three times a day (TID) | INTRAVENOUS | Status: AC
  Administered 2012-01-03: 1 g via INTRAVENOUS
  Filled 2012-01-02 (×2): qty 50

## 2012-01-02 MED ORDER — LIDOCAINE HCL (CARDIAC) 20 MG/ML IV SOLN
INTRAVENOUS | Status: DC | PRN
  Administered 2012-01-02: 60 mg via INTRAVENOUS

## 2012-01-02 MED ORDER — PHENOL 1.4 % MT LIQD: 1.0000 | OROMUCOSAL | Status: DC | PRN

## 2012-01-02 MED ORDER — ARTIFICIAL TEARS OP OINT
TOPICAL_OINTMENT | OPHTHALMIC | Status: DC | PRN
  Administered 2012-01-02: 1 via OPHTHALMIC

## 2012-01-02 MED ORDER — ACETAMINOPHEN 10 MG/ML IV SOLN
1000.0000 mg | Freq: Once | INTRAVENOUS | Status: AC
  Administered 2012-01-02: 1000 mg via INTRAVENOUS
  Filled 2012-01-02: qty 100

## 2012-01-02 MED ORDER — THROMBIN 20000 UNITS EX SOLR
OROMUCOSAL | Status: DC | PRN
  Administered 2012-01-02: 14:00:00 via TOPICAL

## 2012-01-02 MED ORDER — MIDAZOLAM HCL 2 MG/2ML IJ SOLN: 1.0000 mg | INTRAMUSCULAR | Status: DC | PRN

## 2012-01-02 MED ORDER — NEOSTIGMINE METHYLSULFATE 1 MG/ML IJ SOLN
INTRAMUSCULAR | Status: DC | PRN
  Administered 2012-01-02: 5 mg via INTRAVENOUS

## 2012-01-02 MED ORDER — LIDOCAINE HCL 4 % MT SOLN
OROMUCOSAL | Status: DC | PRN
  Administered 2012-01-02: 4 mL via TOPICAL

## 2012-01-02 MED ORDER — EPHEDRINE SULFATE 50 MG/ML IJ SOLN
INTRAMUSCULAR | Status: DC | PRN
  Administered 2012-01-02: 5 mg via INTRAVENOUS
  Administered 2012-01-02: 10 mg via INTRAVENOUS

## 2012-01-02 MED ORDER — GLYCOPYRROLATE 0.2 MG/ML IJ SOLN
INTRAMUSCULAR | Status: DC | PRN
  Administered 2012-01-02: 0.6 mg via INTRAVENOUS

## 2012-01-02 MED ORDER — MORPHINE SULFATE 2 MG/ML IJ SOLN
1.0000 mg | INTRAMUSCULAR | Status: DC | PRN
  Administered 2012-01-03: 2 mg via INTRAVENOUS
  Filled 2012-01-02: qty 1

## 2012-01-02 MED ORDER — METHOCARBAMOL 100 MG/ML IJ SOLN
500.0000 mg | Freq: Four times a day (QID) | INTRAVENOUS | Status: DC | PRN
  Filled 2012-01-02: qty 5

## 2012-01-02 MED ORDER — SODIUM CHLORIDE 0.9 % IJ SOLN
3.0000 mL | Freq: Two times a day (BID) | INTRAMUSCULAR | Status: DC
  Administered 2012-01-02: 3 mL via INTRAVENOUS

## 2012-01-02 MED ORDER — HYDROMORPHONE HCL PF 1 MG/ML IJ SOLN
INTRAMUSCULAR | Status: AC
  Administered 2012-01-02: 0.5 mg via INTRAVENOUS
  Filled 2012-01-02: qty 1

## 2012-01-02 MED ORDER — ONDANSETRON HCL 4 MG/2ML IJ SOLN
INTRAMUSCULAR | Status: DC | PRN
  Administered 2012-01-02: 4 mg via INTRAVENOUS

## 2012-01-02 MED ORDER — ACETAMINOPHEN 10 MG/ML IV SOLN
INTRAVENOUS | Status: AC
  Filled 2012-01-02: qty 100

## 2012-01-02 MED ORDER — MIDAZOLAM HCL 5 MG/5ML IJ SOLN
INTRAMUSCULAR | Status: DC | PRN
  Administered 2012-01-02: 2 mg via INTRAVENOUS

## 2012-01-02 MED ORDER — HYDROMORPHONE HCL PF 1 MG/ML IJ SOLN
0.2500 mg | INTRAMUSCULAR | Status: DC | PRN
  Administered 2012-01-02 (×2): 0.5 mg via INTRAVENOUS

## 2012-01-02 MED ORDER — ZOLPIDEM TARTRATE 5 MG PO TABS: 5.0000 mg | ORAL_TABLET | Freq: Every evening | ORAL | Status: DC | PRN

## 2012-01-02 MED ORDER — PROPOFOL 10 MG/ML IV BOLUS
INTRAVENOUS | Status: DC | PRN
  Administered 2012-01-02: 150 mg via INTRAVENOUS

## 2012-01-02 MED ORDER — ACETAMINOPHEN 10 MG/ML IV SOLN
1000.0000 mg | Freq: Four times a day (QID) | INTRAVENOUS | Status: DC
  Administered 2012-01-03 (×3): 1000 mg via INTRAVENOUS
  Filled 2012-01-02 (×4): qty 100

## 2012-01-02 MED ORDER — SODIUM CHLORIDE 0.9 % IJ SOLN: 3.0000 mL | INTRAMUSCULAR | Status: DC | PRN

## 2012-01-02 MED ORDER — SUFENTANIL CITRATE 50 MCG/ML IV SOLN
INTRAVENOUS | Status: DC | PRN
  Administered 2012-01-02: 5 ug via INTRAVENOUS
  Administered 2012-01-02: 15 ug via INTRAVENOUS
  Administered 2012-01-02: 5 ug via INTRAVENOUS
  Administered 2012-01-02 (×2): 10 ug via INTRAVENOUS

## 2012-01-02 MED ORDER — LACTATED RINGERS IV SOLN
INTRAVENOUS | Status: DC
  Administered 2012-01-03: 85 mL/h via INTRAVENOUS

## 2012-01-02 MED ORDER — LACTATED RINGERS IV SOLN
INTRAVENOUS | Status: DC | PRN
  Administered 2012-01-02 (×2): via INTRAVENOUS

## 2012-01-02 MED ORDER — DOCUSATE SODIUM 100 MG PO CAPS
100.0000 mg | ORAL_CAPSULE | Freq: Two times a day (BID) | ORAL | Status: DC
  Administered 2012-01-02 – 2012-01-03 (×2): 100 mg via ORAL
  Filled 2012-01-02 (×2): qty 1

## 2012-01-02 MED ORDER — ONDANSETRON HCL 4 MG/2ML IJ SOLN: 4.0000 mg | INTRAMUSCULAR | Status: DC | PRN

## 2012-01-02 MED ORDER — FENTANYL CITRATE 0.05 MG/ML IJ SOLN: 50.0000 ug | Freq: Once | INTRAMUSCULAR | Status: DC

## 2012-01-02 MED ORDER — ROCURONIUM BROMIDE 100 MG/10ML IV SOLN
INTRAVENOUS | Status: DC | PRN
  Administered 2012-01-02: 50 mg via INTRAVENOUS

## 2012-01-02 MED ORDER — MENTHOL 3 MG MT LOZG: 1.0000 | LOZENGE | OROMUCOSAL | Status: DC | PRN

## 2012-01-02 MED ORDER — VECURONIUM BROMIDE 10 MG IV SOLR
INTRAVENOUS | Status: DC | PRN
  Administered 2012-01-02: 2 mg via INTRAVENOUS
  Administered 2012-01-02: 1 mg via INTRAVENOUS

## 2012-01-02 MED ORDER — OXYCODONE HCL 5 MG PO TABS: 10.0000 mg | ORAL_TABLET | ORAL | Status: DC | PRN

## 2012-01-02 MED ORDER — GLIPIZIDE 10 MG PO TABS
10.0000 mg | ORAL_TABLET | Freq: Two times a day (BID) | ORAL | Status: DC
  Administered 2012-01-03: 10 mg via ORAL
  Filled 2012-01-02 (×3): qty 1

## 2012-01-02 MED ORDER — METHOCARBAMOL 500 MG PO TABS: 500.0000 mg | ORAL_TABLET | Freq: Four times a day (QID) | ORAL | Status: DC | PRN

## 2012-01-02 MED ORDER — DEXAMETHASONE SODIUM PHOSPHATE 4 MG/ML IJ SOLN
INTRAMUSCULAR | Status: DC | PRN
  Administered 2012-01-02: 4 mg via INTRAVENOUS

## 2012-01-02 MED ORDER — OXYCODONE HCL 5 MG PO TABS: 5.0000 mg | ORAL_TABLET | Freq: Once | ORAL | Status: DC | PRN

## 2012-01-02 MED ORDER — PROMETHAZINE HCL 25 MG/ML IJ SOLN: 6.2500 mg | INTRAMUSCULAR | Status: DC | PRN

## 2012-01-02 MED ORDER — FLEET ENEMA 7-19 GM/118ML RE ENEM: 1.0000 | ENEMA | Freq: Once | RECTAL | Status: AC | PRN

## 2012-01-02 MED ORDER — OXYCODONE HCL 5 MG/5ML PO SOLN: 5.0000 mg | Freq: Once | ORAL | Status: DC | PRN

## 2012-01-02 MED ORDER — SODIUM CHLORIDE 0.9 % IV SOLN: 250.0000 mL | INTRAVENOUS | Status: DC

## 2012-01-02 MED ORDER — BUPIVACAINE-EPINEPHRINE 0.25% -1:200000 IJ SOLN
INTRAMUSCULAR | Status: DC | PRN
  Administered 2012-01-02: 3 mL

## 2012-01-02 MED ORDER — INSULIN GLARGINE 100 UNIT/ML ~~LOC~~ SOLN
60.0000 [IU] | Freq: Two times a day (BID) | SUBCUTANEOUS | Status: DC
  Administered 2012-01-02 – 2012-01-03 (×2): 60 [IU] via SUBCUTANEOUS

## 2012-01-02 MED ORDER — BUPIVACAINE-EPINEPHRINE PF 0.25-1:200000 % IJ SOLN
INTRAMUSCULAR | Status: AC
  Filled 2012-01-02: qty 30

## 2012-01-02 SURGICAL SUPPLY — 70 items
BLADE SURG ROTATE 9660 (MISCELLANEOUS) IMPLANT
BUR EGG ELITE 4.0 (BURR) IMPLANT
BUR MATCHSTICK NEURO 3.0 LAGG (BURR) IMPLANT
CANISTER SUCTION 2500CC (MISCELLANEOUS) ×2 IMPLANT
CLOTH BEACON ORANGE TIMEOUT ST (SAFETY) ×2 IMPLANT
CLSR STERI-STRIP ANTIMIC 1/2X4 (GAUZE/BANDAGES/DRESSINGS) ×2 IMPLANT
COLLAR CERV LO CONTOUR FIRM DE (SOFTGOODS) IMPLANT
CORDS BIPOLAR (ELECTRODE) ×2 IMPLANT
COVER SURGICAL LIGHT HANDLE (MISCELLANEOUS) ×4 IMPLANT
CRADLE DONUT ADULT HEAD (MISCELLANEOUS) ×2 IMPLANT
DERMABOND ADVANCED (GAUZE/BANDAGES/DRESSINGS) ×1
DERMABOND ADVANCED .7 DNX12 (GAUZE/BANDAGES/DRESSINGS) ×1 IMPLANT
DEVICE ENDSKLTN MED 6 7MM (Orthopedic Implant) ×1 IMPLANT
DEVICE ENDSKLTN TC MED 8MM (Orthopedic Implant) ×1 IMPLANT
DRAPE C-ARM 42X72 X-RAY (DRAPES) ×2 IMPLANT
DRAPE POUCH INSTRU U-SHP 10X18 (DRAPES) ×2 IMPLANT
DRAPE SURG 17X23 STRL (DRAPES) ×2 IMPLANT
DRAPE U-SHAPE 47X51 STRL (DRAPES) ×2 IMPLANT
DRILL BIT VECTRA 12MM (BIT) ×2 IMPLANT
DRSG MEPILEX BORDER 4X4 (GAUZE/BANDAGES/DRESSINGS) ×2 IMPLANT
DURAPREP 26ML APPLICATOR (WOUND CARE) ×2 IMPLANT
ELECT COATED BLADE 2.86 ST (ELECTRODE) ×2 IMPLANT
ELECT REM PT RETURN 9FT ADLT (ELECTROSURGICAL) ×2
ELECTRODE REM PT RTRN 9FT ADLT (ELECTROSURGICAL) ×1 IMPLANT
ENDOSKELETON MED 6 7MM (Orthopedic Implant) ×2 IMPLANT
ENDOSKELTON TC IMPLANT 8MM MED (Orthopedic Implant) ×2 IMPLANT
GLOVE BIOGEL PI IND STRL 6.5 (GLOVE) ×1 IMPLANT
GLOVE BIOGEL PI IND STRL 8.5 (GLOVE) ×1 IMPLANT
GLOVE BIOGEL PI INDICATOR 6.5 (GLOVE) ×1
GLOVE BIOGEL PI INDICATOR 8.5 (GLOVE) ×1
GLOVE ECLIPSE 6.0 STRL STRAW (GLOVE) ×2 IMPLANT
GLOVE ECLIPSE 8.5 STRL (GLOVE) ×2 IMPLANT
GOWN PREVENTION PLUS XXLARGE (GOWN DISPOSABLE) ×2 IMPLANT
GOWN STRL NON-REIN LRG LVL3 (GOWN DISPOSABLE) ×4 IMPLANT
KIT BASIN OR (CUSTOM PROCEDURE TRAY) ×2 IMPLANT
KIT ROOM TURNOVER OR (KITS) ×2 IMPLANT
NEEDLE SPNL 18GX3.5 QUINCKE PK (NEEDLE) ×2 IMPLANT
NS IRRIG 1000ML POUR BTL (IV SOLUTION) ×2 IMPLANT
PACK ORTHO CERVICAL (CUSTOM PROCEDURE TRAY) ×2 IMPLANT
PACK UNIVERSAL I (CUSTOM PROCEDURE TRAY) ×2 IMPLANT
PAD ARMBOARD 7.5X6 YLW CONV (MISCELLANEOUS) ×4 IMPLANT
PIN DISTRACTION 14 (PIN) ×4 IMPLANT
PIN RETAINER PRODISC 14 MM (PIN) ×4 IMPLANT
PLATE VECTRA 34MM (Plate) ×2 IMPLANT
PUTTY BONE DBX 5CC MIX (Putty) ×2 IMPLANT
SCREW 14MMX25 (Screw) ×4 IMPLANT
SCREW 4.0X14MM (Screw) ×1 IMPLANT
SCREW 4.0X16MM (Screw) ×2 IMPLANT
SCREW 4.5X16MM (Screw) ×3 IMPLANT
SCREW BN 14X4XSLF DRL VA SLF (Screw) ×1 IMPLANT
SCREW BN 16X4.5XSLF DRL VA (Screw) ×3 IMPLANT
SPONGE INTESTINAL PEANUT (DISPOSABLE) ×2 IMPLANT
SPONGE LAP 4X18 X RAY DECT (DISPOSABLE) ×2 IMPLANT
SPONGE SURGIFOAM ABS GEL 100 (HEMOSTASIS) ×2 IMPLANT
STRIP CLOSURE SKIN 1/2X4 (GAUZE/BANDAGES/DRESSINGS) ×2 IMPLANT
SURGIFLO TRUKIT (HEMOSTASIS) IMPLANT
SUT MNCRL AB 3-0 PS2 18 (SUTURE) ×2 IMPLANT
SUT SILK 2 0 (SUTURE) ×1
SUT SILK 2-0 18XBRD TIE 12 (SUTURE) ×1 IMPLANT
SUT VIC AB 2-0 CT1 18 (SUTURE) ×2 IMPLANT
SUT VIC AB 3-0 54X BRD REEL (SUTURE) IMPLANT
SUT VIC AB 3-0 BRD 54 (SUTURE)
SYR BULB IRRIGATION 50ML (SYRINGE) ×2 IMPLANT
SYR CONTROL 10ML LL (SYRINGE) ×2 IMPLANT
TAPE CLOTH 4X10 WHT NS (GAUZE/BANDAGES/DRESSINGS) ×2 IMPLANT
TAPE UMBILICAL COTTON 1/8X30 (MISCELLANEOUS) ×2 IMPLANT
TOWEL OR 17X24 6PK STRL BLUE (TOWEL DISPOSABLE) ×2 IMPLANT
TOWEL OR 17X26 10 PK STRL BLUE (TOWEL DISPOSABLE) ×2 IMPLANT
TRAY FOLEY CATH 14FR (SET/KITS/TRAYS/PACK) IMPLANT
WATER STERILE IRR 1000ML POUR (IV SOLUTION) ×2 IMPLANT

## 2012-01-02 NOTE — H&P (Signed)
H+P REVIEWED NO CHANGE IN CLINICAL EXAM 

## 2012-01-02 NOTE — Preoperative (Signed)
Beta Blockers   Reason not to administer Beta Blockers:Not Applicable 

## 2012-01-02 NOTE — Anesthesia Postprocedure Evaluation (Signed)
  Anesthesia Post-op Note  Patient: Alyssa Perkins  Procedure(s) Performed: Procedure(s) (LRB) with comments: ANTERIOR CERVICAL DECOMPRESSION/DISCECTOMY FUSION 2 LEVELS (N/A) - ACDF C5-7  Patient Location: PACU  Anesthesia Type: General  Level of Consciousness: awake and alert   Airway and Oxygen Therapy: Patient Spontanous Breathing  Post-op Pain: mild  Post-op Assessment: Post-op Vital signs reviewed, Patient's Cardiovascular Status Stable, Respiratory Function Stable, Patent Airway, No signs of Nausea or vomiting and Pain level controlled  Post-op Vital Signs: stable  Complications: No apparent anesthesia complications

## 2012-01-02 NOTE — Brief Op Note (Signed)
01/02/2012  4:17 PM  PATIENT:  Alyssa Perkins  61 y.o. female  PRE-OPERATIVE DIAGNOSIS:  CERVICAL SPONDYLOTIC RADICULOPATHY  POST-OPERATIVE DIAGNOSIS:  CERVICAL SPONDYLOTIC RADICULOPATHY  PROCEDURE:  Procedure(s) (LRB) with comments: ANTERIOR CERVICAL DECOMPRESSION/DISCECTOMY FUSION 2 LEVELS (N/A) - ACDF C5-7  SURGEON:  Surgeon(s) and Role:    * Venita Lick, MD - Primary  PHYSICIAN ASSISTANT:   ASSISTANTS: Norval Gable   ANESTHESIA:   general  EBL:  Total I/O In: 1500 [I.V.:1500] Out: -   BLOOD ADMINISTERED: none  DRAINS: none   LOCAL MEDICATIONS USED:  MARCAINE     SPECIMEN:  No Specimen  DISPOSITION OF SPECIMEN:  N/A  COUNTS:  YES  TOURNIQUET:  * No tourniquets in log *  DICTATION: .Other Dictation: Dictation Number [id number]  PLAN OF CARE: Admit to inpatient   PATIENT DISPOSITION:  PACU - hemodynamically stable.

## 2012-01-02 NOTE — Anesthesia Procedure Notes (Signed)
Procedure Name: Intubation Date/Time: 01/02/2012 1:01 PM Performed by: Lovie Chol Pre-anesthesia Checklist: Patient identified, Emergency Drugs available, Suction available, Patient being monitored and Timeout performed Patient Re-evaluated:Patient Re-evaluated prior to inductionOxygen Delivery Method: Circle system utilized Preoxygenation: Pre-oxygenation with 100% oxygen Intubation Type: IV induction Ventilation: Mask ventilation without difficulty Laryngoscope Size: Miller and 2 Grade View: Grade I Tube type: Oral Tube size: 7.5 mm Number of attempts: 1 Airway Equipment and Method: Stylet and LTA kit utilized Placement Confirmation: ETT inserted through vocal cords under direct vision,  positive ETCO2 and breath sounds checked- equal and bilateral Secured at: 21 cm Tube secured with: Tape Dental Injury: Teeth and Oropharynx as per pre-operative assessment

## 2012-01-02 NOTE — Anesthesia Preprocedure Evaluation (Addendum)
Anesthesia Evaluation  Patient identified by MRN, date of birth, ID band Patient awake    Reviewed: Allergy & Precautions, H&P , NPO status , Patient's Chart, lab work & pertinent test results  History of Anesthesia Complications (+) PONV  Airway Mallampati: II TM Distance: >3 FB Neck ROM: Full    Dental  (+) Teeth Intact and Dental Advisory Given   Pulmonary asthma , former smoker,  breath sounds clear to auscultation        Cardiovascular + angina + CAD Rhythm:Regular Rate:Normal     Neuro/Psych  Headaches, Anxiety Depression  Neuromuscular disease    GI/Hepatic GERD-  Medicated and Controlled,  Endo/Other  diabetes, Type 2, Insulin Dependent and Oral Hypoglycemic AgentsMorbid obesity  Renal/GU      Musculoskeletal   Abdominal (+) + obese,   Peds  Hematology   Anesthesia Other Findings   Reproductive/Obstetrics                          Anesthesia Physical Anesthesia Plan  ASA: III  Anesthesia Plan: General   Post-op Pain Management:    Induction: Intravenous  Airway Management Planned: Oral ETT  Additional Equipment:   Intra-op Plan:   Post-operative Plan: Extubation in OR  Informed Consent: I have reviewed the patients History and Physical, chart, labs and discussed the procedure including the risks, benefits and alternatives for the proposed anesthesia with the patient or authorized representative who has indicated his/her understanding and acceptance.     Plan Discussed with: CRNA and Surgeon  Anesthesia Plan Comments:         Anesthesia Quick Evaluation

## 2012-01-02 NOTE — Transfer of Care (Signed)
Immediate Anesthesia Transfer of Care Note  Patient: Alyssa Perkins  Procedure(s) Performed: Procedure(s) (LRB) with comments: ANTERIOR CERVICAL DECOMPRESSION/DISCECTOMY FUSION 2 LEVELS (N/A) - ACDF C5-7  Patient Location: PACU  Anesthesia Type: General  Level of Consciousness: awake, oriented and sedated  Airway & Oxygen Therapy: Patient Spontanous Breathing and Patient connected to nasal cannula oxygen  Post-op Assessment: Report given to PACU RN and Post -op Vital signs reviewed and stable  Post vital signs: Reviewed  Complications: No apparent anesthesia complications

## 2012-01-03 ENCOUNTER — Encounter (HOSPITAL_COMMUNITY): Payer: Self-pay | Admitting: Orthopedic Surgery

## 2012-01-03 LAB — GLUCOSE, CAPILLARY
Glucose-Capillary: 98 mg/dL (ref 70–99)
Glucose-Capillary: 128 mg/dL — ABNORMAL HIGH (ref 70–99)

## 2012-01-03 LAB — HEMOGLOBIN A1C
Hgb A1c MFr Bld: 7 % — ABNORMAL HIGH (ref <5.7)
Mean Plasma Glucose: 154 mg/dL — ABNORMAL HIGH (ref <117)

## 2012-01-03 MED ORDER — ONDANSETRON HCL 4 MG PO TABS: 4.0000 mg | ORAL_TABLET | Freq: Three times a day (TID) | ORAL | Status: DC | PRN

## 2012-01-03 MED ORDER — METHOCARBAMOL 500 MG PO TABS: 500.0000 mg | ORAL_TABLET | Freq: Three times a day (TID) | ORAL | Status: DC

## 2012-01-03 MED ORDER — OXYCODONE-ACETAMINOPHEN 10-325 MG PO TABS: 1.0000 | ORAL_TABLET | Freq: Four times a day (QID) | ORAL | Status: DC | PRN

## 2012-01-03 MED ORDER — POLYETHYLENE GLYCOL 3350 17 G PO PACK: 17.0000 g | PACK | Freq: Every day | ORAL | Status: DC

## 2012-01-03 NOTE — Discharge Summary (Signed)
Patient ID: Alyssa Perkins MRN: [id number] DOB/AGE: Apr 28, 1950 61 y.o.  Admit date: 01/02/2012 Discharge date: 01/03/2012  Admission Diagnoses:  Active Problems:  Cervical radiculopathy   Discharge Diagnoses:  Active Problems:  Cervical radiculopathy  status post Procedure(s): ANTERIOR CERVICAL DECOMPRESSION/DISCECTOMY FUSION 2 LEVELS  Past Medical History  Diagnosis Date  . Complication of anesthesia     "difficulty waking up from surgery"  . PONV (postoperative nausea and vomiting)   . Anginal pain   . Depression     has OCD, states counts constantly  . Anxiety     not on any medication at this time  . Pneumonia     hx of  . Bronchitis     hx of  . Diabetes mellitus without complication   . Asthma     "sports asthma or exercised induced"seen at [facility] for primary  . GERD (gastroesophageal reflux disease)   . Headache     hx of migraines  . Neuromuscular disorder     past hx of carpal tunnel bilaterally  . Arthritis   . Chronic neck pain   . Chronic back pain   . Hyperlipidemia     Surgeries: Procedure(s): ANTERIOR CERVICAL DECOMPRESSION/DISCECTOMY FUSION 2 LEVELS on 01/02/2012   Consultants: none  Discharged Condition: Improved  Hospital Course: Alyssa Perkins is an 61 y.o. female who was admitted 01/02/2012 for operative treatment of cervical radiculopathy. Patient failed conservative treatments (please see the history and physical for the specifics) and had severe unremitting pain that affects sleep, daily activities and work/hobbies. After pre-op clearance, the patient was taken to the operating room on 01/02/2012 and underwent  Procedure(s): ANTERIOR CERVICAL DECOMPRESSION/DISCECTOMY FUSION 2 LEVELS.    Patient was given perioperative antibiotics: Anti-infectives     Start     Dose/Rate Route Frequency Ordered Stop   01/02/12 1845   ceFAZolin (ANCEF) IVPB 1 g/50 mL premix        1 g 100 mL/hr over 30 Minutes  Intravenous Every 8 hours 01/02/12 1836 01/03/12 1044   01/01/12 1409   ceFAZolin (ANCEF) IVPB 2 g/50 mL premix        2 g 100 mL/hr over 30 Minutes Intravenous 60 min pre-op 01/01/12 1409 01/02/12 1255           Patient was given sequential compression devices and early ambulation to prevent DVT.   Patient benefited maximally from hospital stay and there were no complications. At the time of discharge, the patient was urinating/moving their bowels without difficulty, tolerating a regular diet, pain is controlled with oral pain medications and they have been cleared by PT/OT.   Recent vital signs: Patient Vitals for the past 24 hrs:  BP Temp Temp src Pulse Resp SpO2 Height  01/03/12 0600 126/56 mmHg 98.4 F (36.9 C) - 59  16  96 % -  01/02/12 2200 132/60 mmHg 97.9 F (36.6 C) - 86  16  97 % -  01/02/12 1800 140/66 mmHg 98.4 F (36.9 C) Oral 66  18  98 % 5\' 3"  (1.6 m)  01/02/12 1740 139/69 mmHg 98 F (36.7 C) - 58  22  98 % -  01/02/12 1725 148/75 mmHg - - 64  21  99 % -  01/02/12 1715 - - - 71  19  98 % -  01/02/12 1700 146/67 mmHg - - 72  28  97 % -  01/02/12 1640 148/79 mmHg 98.2 F (36.8 C) - 74  29  100 % -  01/02/12 0958 149/74 mmHg 97.5 F (36.4 C) Oral 72  16  98 % -     Recent laboratory studies: No results found for this basename: WBC:2,HGB:2,HCT:2,PLT:2,NA:2,K:2,CL:2,CO2:2,BUN:2,CREATININE:2,GLUCOSE:2,PT:2,INR:2,CALCIUM,2: in the last 72 hours   Discharge Medications:     Medication List     As of 01/03/2012  7:32 AM    STOP taking these medications         meloxicam 15 MG tablet   Commonly known as: MOBIC      TAKE these medications         glipiZIDE 10 MG tablet   Commonly known as: GLUCOTROL   Take 10 mg by mouth 2 (two) times daily before a meal.      hydrOXYzine 25 MG tablet   Commonly known as: ATARAX/VISTARIL   Take 25 mg by mouth daily as needed. For itching      insulin glargine 100 UNIT/ML injection   Commonly known as: LANTUS   Inject  60 Units into the skin 2 (two) times daily. Takes twice daily per patient      methocarbamol 500 MG tablet   Commonly known as: ROBAXIN   Take 1 tablet (500 mg total) by mouth 3 (three) times daily. MAX 3 pills daily      omeprazole 20 MG capsule   Commonly known as: PRILOSEC   Take 20 mg by mouth daily.      ondansetron 4 MG tablet   Commonly known as: ZOFRAN   Take 1 tablet (4 mg total) by mouth every 8 (eight) hours as needed for nausea. MAX 3 pills daily      oxyCODONE-acetaminophen 10-325 MG per tablet   Commonly known as: PERCOCET   Take 1 tablet by mouth every 6 (six) hours as needed for pain. MAX 4 pills daily      polyethylene glycol packet   Commonly known as: MIRALAX / GLYCOLAX   Take 17 g by mouth daily. Take 1 packet daily until bowels become regular      quinapril 20 MG tablet   Commonly known as: ACCUPRIL   Take 20 mg by mouth at bedtime.      simvastatin 40 MG tablet   Commonly known as: ZOCOR   Take 40 mg by mouth at bedtime.        Diagnostic Studies: Dg Chest 2 View  12/30/2011  *RADIOLOGY REPORT*  Clinical Data: Preop neck surgery.  History of asthma.  CHEST - 2 VIEW  Comparison: 11/07/2010  Findings: Heart and mediastinal contours are within normal limits. No focal opacities or effusions.  No acute bony abnormality.  IMPRESSION: No active cardiopulmonary disease.   Original Report Authenticated By: Cyndie Chime, M.D.    Dg Cervical Spine 2-3 Views  01/02/2012  *RADIOLOGY REPORT*  Clinical Data: Status post spinal fusion.  CERVICAL SPINE - 2-3 VIEW  Comparison: Intraoperative radiographs from 01/02/2012.  Findings: Three views of the cervical spine demonstrates postoperative changes of a ACDF at C5-C7 with interbody cages at C5- C6 and C6-C7.  Alignment appears anatomic.  IMPRESSION: 1.  Status post ACDF fat C5-C7, as above.   Original Report Authenticated By: Florencia Reasons, M.D.    Dg Cervical Spine 2-3 Views  01/02/2012  *RADIOLOGY REPORT*   Clinical Data: ACDF.  DG C-ARM GT 120 MIN,CERVICAL SPINE - 2-3 VIEW  Comparison: 12/30/2011  Findings: Three intraoperative spot images demonstrate changes of two level fusion within the mid and lower cervical spine.  Lower cervical spine  is difficult to visualize on the lateral view due to overlying shoulders.  On the AP view, the surgical levels appear to be C5-C7.  IMPRESSION: ACDF as above.   Original Report Authenticated By: Cyndie Chime, M.D.    Dg Cervical Spine 2-3 Views  12/30/2011  *RADIOLOGY REPORT*  Clinical Data: Neck pain  CERVICAL SPINE - 2-3 VIEW  Comparison: None.  Findings: There is advanced disc space narrowing at C6-C7 with anterior spurring.  The alignment is anatomic.  No worrisome osseous destructive lesions.  IMPRESSION: C6-C7 degenerative disc disease.   Original Report Authenticated By: Elsie Stain, M.D.    Dg C-arm Gt 120 Min  01/02/2012  *RADIOLOGY REPORT*  Clinical Data: ACDF.  DG C-ARM GT 120 MIN,CERVICAL SPINE - 2-3 VIEW  Comparison: 12/30/2011  Findings: Three intraoperative spot images demonstrate changes of two level fusion within the mid and lower cervical spine.  Lower cervical spine is difficult to visualize on the lateral view due to overlying shoulders.  On the AP view, the surgical levels appear to be C5-C7.  IMPRESSION: ACDF as above.   Original Report Authenticated By: Cyndie Chime, M.D.         Discharge Orders    Future Orders Please Complete By Expires   Diet - low sodium heart healthy      Call MD / Call 911      Comments:   If you experience chest pain or shortness of breath, CALL 911 and be transported to the hospital emergency room.  If you develope a fever above 101 F, pus (white drainage) or increased drainage or redness at the wound, or calf pain, call your surgeon's office.   Constipation Prevention      Comments:   Drink plenty of fluids.  Prune juice may be helpful.  You may use a stool softener, such as Colace (over the counter) 100  mg twice a day.  Use MiraLax (over the counter) for constipation as needed.   Increase activity slowly as tolerated      Discharge instructions      Comments:   Keep incision clean and dry.  Leave steri strips in place.  May shower 5 days from surgery; pat to dry following shower.  May redress with clean, dry dressing if you would like.  Do not apply any lotion/cream/ointment to the incision.   Driving restrictions      Comments:   No driving for 2 weeks.  Dr Shon Baton will discuss addition driving restrictions at your first post-op visit in 2 weeks.   Lifting restrictions      Comments:   No lifting anything greater than 5 pounds.  DO NOT reach overhead (above shoulder height).  NO bending, stooping or squatting.  Dr. Shon Baton will discuss additional lifting restrictions at your first post-op visit in 2 weeks.      Follow-up Information    Follow up with Alvy Beal, MD. In 2 weeks.   Contact information:   [facility] [address] [place] [address] [phone]          Discharge Plan:  discharge to home  Disposition: stable at the time of discharge    Signed: Gwinda Maine for Dr. Venita Lick [facility] [phone] 01/03/2012, 7:32 AM

## 2012-01-03 NOTE — Progress Notes (Signed)
UR COMPLETED  

## 2012-01-03 NOTE — Progress Notes (Signed)
Orthopedic Tech Progress Note Patient Details:  Alyssa Perkins 1951-01-07 [id number] Upon verification of Ortho orders Tech discovered patient had already been provided with Aspen collar. No action needed at this time.  Ortho Devices Type of Ortho Device: Aspen cervical collar Ortho Device/Splint Interventions: Ordered   Asia R Thompson 01/03/2012, 9:54 AM

## 2012-01-03 NOTE — Evaluation (Signed)
I agree with the following treatment note after reviewing documentation.   Johnston, Buell Parcel Brynn   OTR/L Pager: [phone] Office: [phone] .   

## 2012-01-03 NOTE — Progress Notes (Signed)
Occupational Therapy Discharge Patient Details Name: Alyssa Perkins MRN: [id number] DOB: Jan 06, 1951 Today's Date: 01/03/2012 Time: 9629-5284 OT Time Calculation (min): 25 min  Patient discharged from OT services secondary to Pt. educated on cervical precautions, don/doff brace, and AE equipment to (A) with LB bathing and dressing. Pt. independent to don/doff brace and demonstrates ADL's at supervision level. No further acute OT needs at this time.   Please see latest therapy progress note for current level of functioning and progress toward goals.    Progress and discharge plan discussed with patient and/or caregiver: Patient/Caregiver agrees with plan  GO     Cleora Fleet 01/03/2012, 9:01 AM

## 2012-01-03 NOTE — Progress Notes (Addendum)
Referral received for SNF. Chart reviewed and CSW has spoken with RNCM who indicates that patient is for DC to home with no HH/DME needs identified.  Patient is 62 years old- will not require SNF placement. DC home today.  CSW to sign off. Please re-consult if CSW needs arise.  Lorri Frederick. [place]  [phone]

## 2012-01-03 NOTE — Op Note (Signed)
NAMECACHE, Alyssa Perkins           ACCOUNT NO.:  [id number]  MEDICAL RECORD NO.:  [id number]  LOCATION:  5N06C                        FACILITY:  [facility]  PHYSICIAN:  Alvy Beal, MD    DATE OF BIRTH:  1951/01/13  DATE OF PROCEDURE: DATE OF DISCHARGE:                              OPERATIVE REPORT   PREOPERATIVE DIAGNOSIS:  Cervical spondylotic radiculopathy.  POSTOPERATIVE DIAGNOSIS:  Cervical spondylotic radiculopathy.  OPERATIVE PROCEDURE:  Anterior cervical diskectomy and fusion, C5-6, C6- 7.  COMPLICATIONS:  None.  CONDITION:  Stable.  INSTRUMENTATION SYSTEM USED:  Synthes anterior cervical Vectra plate with 16 mm Rescue screws into the body of C5, 14 mm screws into the bodies of 6 and 7, 8 mm lordotic titanium medium cage at C6-7, 7 mm lordotic Titan intervertebral cage at C5-6, both packed with DBX mixture.  FIRST ASSISTANT:  Norval Gable, PA  HISTORY:  This is a very pleasant elderly woman who has been having progressive debilitating neck and radicular right arm pain.  Attempts at conservative management have failed to alleviate her symptoms.  As a result of this, she elected to proceed with a surgical solution to her pain.  All appropriate risks, benefits, and alternatives were discussed with the patient.  Consent was obtained.  OPERATIVE NOTE:  The patient was brought to the operating room, placed supine on the operating table.  After successful induction of general anesthesia and endotracheal intubation, TEDs, SCDs were applied, and rolled towel was placed between the shoulder blades and the shoulders themselves were taped down for visualization.  The anterior cervical spine was prepped and draped in a standard fashion.  Because of her body habitus, it was quite difficult to visualize the C6-7 disk space. However, I was able in an oblique shot visualize the appropriate levels. Once I knew I could see the appropriate levels, the neck was then prepped and draped  in a standard fashion.  Appropriate time-out was done to confirm patient, procedure, and all other pertinent important data. Once this was complete, a transverse incision site was mapped starting from the midline, going to the left and it was infiltrated with 0.25% Marcaine.  Incision was made and a standard Smith-Robinson approach was taken to the anterior cervical spine via the left transverse incision. I dissected along the internervous plane.  I dissected down to and through the platysma.  I then identified the sternocleidomastoid.  I then mobilized along the medial border of this, sweeping the omohyoid medially and dissecting in the internervous plane.  I then identified the trachea and the esophagus and protected them and retracted them to the right with an appendiceal retractor.  I palpated and visualized the carotid sheath and protected this with my finger.  At this point, I was able to dissect bluntly through the remainder of the deep cervical and prevertebral fascia down to the anterior longitudinal ligament.  Once this was done, I then exposed the C5-6 and C6-7 disk spaces.  I then placed a needle into the 5-6 disk space and took a lateral x-ray to confirm that I was at the appropriate level.  Once this was done, bipolar electrocautery was used to mobilize the longus coli muscles from the midbody of  C5 to the midbody of C8.  Double-action Leksell rongeur as well as a Theatre manager was used to remove the significant anterior osteophytes (exostosis) at the 5-6 and 6-7 levels.  Once this was completed, I then placed self-retaining retractors underneath the longus coli muscles bilaterally, deflated the endotracheal cuff, expanded the retractors to the appropriate width and then reinflated the endotracheal cuff.  I then placed distraction pins into the bodies of C6 and C7 and gently distracted the space.  A #15 blade scalpel was used to perform an annulotomy.  Using  pituitary rongeurs, curettes, and Kerrison rongeurs, I removed all the disk material from C6-7 disk space.  I then used a micro nerve hook to develop a plane underneath the posterior anulus and posterior longitudinal ligament.  I then used a small nerve curette to exploit this plane and release the posterior longitudinal ligament.  Once this was completed, I was able to get my 1 mm Kerrison underneath the posterior longitudinal ligament and resected.  This allowed me to directly decompress the thecal sac underneath the uncovertebral joints to decompress the nerve root.  At this point, I rasped the endplates of bleeding subchondral bone and then I trialed a 7 and 8 mm intervertebral device.  I felt as though the 8 medium provided the best fit and so this was obtained.  I packed it with DBS mix and malleted it to the appropriate depth.  I had excellent fixation with the intervertebral graft.  Distraction pins were removed from C7 and repositioned to C5 and I repositioned the self-retaining retractors.  Using the same technique that I had used at the 6-7 level, I performed a diskectomy at the 5-6 level.  Again the posterior longitudinal ligament was taken down in order to adequately decompress the thecal sac and the nerve root.  I rasped the endplates and at this time I used a 7 mm lordotic medium cage.  Once both cages were in place, I removed the distraction pins and then contoured an anterior cervical plate.  I fixed it with 16 mm screws into the body of C5, 14 mm screws into the body of C6, and 14 mm screws into the body of C6.  All screws had excellent purchase.  I then irrigated the wound copiously with normal saline.  I then checked to ensure all screws were locked to the plate and the esophagus did not become entrapped beneath the plate.  Once I confirmed this, I returned the trachea and esophagus to midline after I obtained hemostasis using bipolar electrocautery.  The platysma  was reapproximated using 2-0 Vicryl and then I closed the skin with 3-0 Monocryl.  Steri-Strips and dry dressing were applied, and the patient was extubated and transferred to the PACU without incident.  At the end of the case, all needle and sponge counts were correct.  First assistant Norval Gable, PA.  No intraoperative complications.     Alvy Beal, MD     DDB/MEDQ  D:  01/02/2012  T:  01/03/2012  Job:  [id number]

## 2012-01-03 NOTE — Evaluation (Signed)
Occupational Therapy Evaluation Patient Details Name: Alyssa Perkins MRN: [id number] DOB: December 09, 1950 Today's Date: 01/03/2012 Time: 1308-6578 OT Time Calculation (min): 25 min  OT Assessment / Plan / Recommendation Clinical Impression  Pt. 61 yo female s/p ACDF. Pt doing very well and ready to return home. Education provided on cervical precautions, don/doff aspen collar, and AE for LB dressing. Pt. is able to perform ADL's at supervision level and don/doff brace independently. No further acute OT needs at this time. OT to sign off     OT Assessment  Patient does not need any further OT services    Follow Up Recommendations  No OT follow up    Barriers to Discharge      Equipment Recommendations  None recommended by OT    Recommendations for Other Services    Frequency       Precautions / Restrictions Precautions Precautions: Cervical Required Braces or Orthoses: Cervical Brace Restrictions Weight Bearing Restrictions: No   Pertinent Vitals/Pain No pain reported by patient    ADL  Eating/Feeding: Performed;Supervision/safety Where Assessed - Eating/Feeding: Bed level Grooming: Performed;Wash/dry hands;Wash/dry face;Teeth care;Brushing hair;Supervision/safety Where Assessed - Grooming: Unsupported standing Lower Body Bathing: Simulated;Supervision/safety Where Assessed - Lower Body Bathing: Unsupported sit to stand Lower Body Dressing: Performed;Supervision/safety Where Assessed - Lower Body Dressing: Unsupported sit to stand Toilet Transfer: Simulated;Supervision/safety Toilet Transfer Method: Sit to Barista: Raised toilet seat with arms (or 3-in-1 over toilet) Toileting - Clothing Manipulation and Hygiene: Performed;Supervision/safety Where Assessed - Glass blower/designer Manipulation and Hygiene: Sit to stand from 3-in-1 or toilet Tub/Shower Transfer: Simulated;Supervision/safety Tub/Shower Transfer Method: Land: Walk in shower Equipment Used: Gait belt Transfers/Ambulation Related to ADLs: Pt. performs transfers and ambulation in room with supervision for safety.  ADL Comments: Educated pt. on cervical precautions and gave handout. Pt. able to don/doff aspen collar independently and educated on proper cleaning techniques and how to change out extra pads. Pt completed grooming tasks at sink level with supervision to ensure maintaining of cervical precautions. Performed LB dressing sit to stand from bed with supervision. Educated on use of AE to (A) with dressing, pt. stated she already has all of the equipment from a previous knee surgery and understands how to use it.Instructed pt on log roll technique for bed mobility, demonstrates supervision level to ensure cervical precautions.       OT Diagnosis:    OT Problem List:   OT Treatment Interventions:     OT Goals    Visit Information  Last OT Received On: 01/03/12 Assistance Needed: +1    Subjective Data  Subjective: I am ready to go home  Patient Stated Goal: To get dressed, and go home.   Prior Functioning     Home Living Lives With: Spouse;Other (Comment) (grandson) Available Help at Discharge: Available 24 hours/day;Family Type of Home: House Home Access: Ramped entrance Home Layout: Two level;Able to live on main level with bedroom/bathroom Alternate Level Stairs-Number of Steps: 14 Alternate Level Stairs-Rails: Left Bathroom Shower/Tub: Health visitor: Handicapped height Bathroom Accessibility: Yes How Accessible: Accessible via walker Home Adaptive Equipment: Shower chair with back;Walker - rolling;Straight cane;Wheelchair - manual;Grab bars in shower;Grab bars around toilet;Art gallery manager Prior Function Level of Independence: Independent Able to Take Stairs?: Yes Driving: Yes Vocation: Unemployed Communication Communication: No difficulties Dominant Hand: Right         Vision/Perception      Cognition  Overall Cognitive Status: Appears within functional limits for tasks assessed/performed Arousal/Alertness:  Awake/alert Orientation Level: Appears intact for tasks assessed Behavior During Session: [facility] for tasks performed    Extremity/Trunk Assessment Right Upper Extremity Assessment RUE ROM/Strength/Tone: [facility] for tasks assessed Left Upper Extremity Assessment LUE ROM/Strength/Tone: [facility] for tasks assessed     Mobility Bed Mobility Bed Mobility: Rolling Left;Left Sidelying to Sit;Sit to Sidelying Left Rolling Left: 5: Supervision Left Sidelying to Sit: 5: Supervision;HOB elevated (Pt. states she sleeps propped up in bed, never lays flat) Sit to Sidelying Left: 5: Supervision;HOB elevated Details for Bed Mobility Assistance: Cues for correct technique while adhering to cervical precautions Transfers Transfers: Sit to Stand;Stand to Sit Sit to Stand: 5: Supervision;With upper extremity assist;From bed Stand to Sit: 5: Supervision;With upper extremity assist;To bed                    End of Session OT - End of Session Equipment Utilized During Treatment: Cervical collar Activity Tolerance: Patient tolerated treatment well Patient left: in bed;with call bell/phone within reach Nurse Communication: Mobility status;Precautions  GO     Cleora Fleet 01/03/2012, 9:00 AM

## 2012-01-03 NOTE — Progress Notes (Signed)
    Subjective: Procedure(s) (LRB): ANTERIOR CERVICAL DECOMPRESSION/DISCECTOMY FUSION 2 LEVELS (N/A) 1 Day Post-Op  Patient reports pain as 3 on 0-10 scale.  Reports decreased arm pain reports incisional neck pain   Positive void Negative bowel movement Positive flatus Negative chest pain or shortness of breath  Objective: Vital signs in last 24 hours: Temp:  [97.5 F (36.4 C)-98.4 F (36.9 C)] 98.4 F (36.9 C) (10/18 0600) Pulse Rate:  [58-86] 59  (10/18 0600) Resp:  [16-29] 16  (10/18 0600) BP: (126-149)/(56-79) 126/56 mmHg (10/18 0600) SpO2:  [96 %-100 %] 96 % (10/18 0600) FiO2 (%):  [2 %] 2 % (10/17 2200)  Intake/Output from previous day: 10/17 0701 - 10/18 0700 In: 1750 [I.V.:1750] Out: -   Labs: No results found for this basename: WBC:2,RBC:2,HCT:2,PLT:2 in the last 72 hours No results found for this basename: NA:2,K:2,CL:2,CO2:2,BUN:2,CREATININE:2,GLUCOSE:2,CALCIUM:2 in the last 72 hours No results found for this basename: LABPT:2,INR:2 in the last 72 hours  Physical Exam: Neurologically intact ABD soft Neurovascular intact Incision: dressing C/D/I and no drainage Compartment soft  Assessment/Plan: Patient stable  xrays satisfactory Mobilization with physical therapy Encourage incentive spirometry Continue care  Advance diet Up with therapy D/C IV fluids D/c to home this afternoon  Venita Lick, MD [facility] [phone]

## 2012-01-03 NOTE — Progress Notes (Signed)
I agree with the following treatment note after reviewing documentation.   Johnston, Sanel Stemmer Brynn   OTR/L Pager: [phone] Office: [phone] .   

## 2012-01-03 NOTE — Progress Notes (Signed)
Physical Therapy Evaluation Patient Details Name: Alyssa Perkins MRN: [id number] DOB: Jul 14, 1950 Today's Date: 01/03/2012 Time: 4098-1191 PT Time Calculation (min): 24 min  PT Assessment / Plan / Recommendation Clinical Impression  Pt is a 61 y/o female s/p cervical fusion.  Pt is independent with all mobility and presents with no further acute PT need.      PT Assessment  Patent does not need any further PT services    Follow Up Recommendations  No PT follow up    Does the patient have the potential to tolerate intense rehabilitation      Barriers to Discharge        Equipment Recommendations  None recommended by PT    Recommendations for Other Services     Frequency      Precautions / Restrictions Precautions Precautions: Cervical Required Braces or Orthoses: Cervical Brace Restrictions Weight Bearing Restrictions: No   Pertinent Vitals/Pain C/o 2/10 pain in neck      Mobility  Bed Mobility Bed Mobility: Rolling Left;Left Sidelying to Sit;Sit to Sidelying Left Rolling Left: 5: Supervision Left Sidelying to Sit: 5: Supervision;HOB elevated Sit to Sidelying Left: 5: Supervision;HOB elevated Details for Bed Mobility Assistance: Cues for technique.   Transfers Transfers: Sit to Stand;Stand to Sit;Stand Pivot Transfers Sit to Stand: 6: Modified independent (Device/Increase time);From chair/3-in-1;From bed;With upper extremity assist Stand to Sit: 5: Supervision;With upper extremity assist;To bed Stand Pivot Transfers: 5: Supervision Details for Transfer Assistance: Supervision for safety.  Ambulation/Gait Ambulation/Gait Assistance: 7: Independent Ambulation Distance (Feet): 150 Feet Assistive device: None Gait Pattern: Within Functional Limits Stairs: Yes Wheelchair Mobility Wheelchair Mobility: No    Shoulder Instructions     Exercises     PT Diagnosis:    PT Problem List:   PT Treatment Interventions:     PT Goals Acute Rehab PT Goals PT  Goal Formulation: With patient/family  Visit Information  Last PT Received On: 01/03/12 Assistance Needed: +1    Subjective Data  Subjective: agree to PT eval.     Prior Functioning  Home Living Lives With: Spouse;Other (Comment) Available Help at Discharge: Available 24 hours/day;Family Type of Home: House Home Access: Ramped entrance Home Layout: Two level;Able to live on main level with bedroom/bathroom Alternate Level Stairs-Number of Steps: 14 Alternate Level Stairs-Rails: Left Bathroom Shower/Tub: Health visitor: Handicapped height Bathroom Accessibility: Yes How Accessible: Accessible via walker Home Adaptive Equipment: Shower chair with back;Walker - rolling;Straight cane;Wheelchair - manual;Grab bars in shower;Grab bars around toilet;Art gallery manager Prior Function Level of Independence: Independent Able to Take Stairs?: Yes Driving: Yes Vocation: Unemployed Communication Communication: No difficulties Dominant Hand: Right    Cognition  Overall Cognitive Status: Appears within functional limits for tasks assessed/performed Arousal/Alertness: Awake/alert Orientation Level: Appears intact for tasks assessed Behavior During Session: [facility] for tasks performed    Extremity/Trunk Assessment Right Upper Extremity Assessment RUE ROM/Strength/Tone: [facility] for tasks assessed Left Upper Extremity Assessment LUE ROM/Strength/Tone: WFL for tasks assessed Right Lower Extremity Assessment RLE ROM/Strength/Tone: Within functional levels Left Lower Extremity Assessment LLE ROM/Strength/Tone: Within functional levels   Balance Balance Balance Assessed: No  End of Session PT - End of Session Equipment Utilized During Treatment: Gait belt Patient left: in chair;with call bell/phone within reach Nurse Communication: Mobility status  GP     Alyssa Perkins 01/03/2012, 1:18 PM Alyssa Perkins DPT [phone]

## 2012-01-03 NOTE — Progress Notes (Signed)
CARE MANAGEMENT NOTE 01/03/2012  Patient:  Alyssa Perkins, Alyssa Perkins   Account Number:  [id number]  Date Initiated:  01/03/2012  Documentation initiated by:  Vance Peper  Subjective/Objective Assessment:   61 yr old female s/p C5-6,6-7 ACDF.     Action/Plan:   No home health needs identified by therapists.   Anticipated DC Date:  01/03/2012   Anticipated DC Plan:  HOME/SELF CARE      DC Planning Services  CM consult      Choice offered to / List presented to:             Status of service:  Completed, signed off Medicare Important Message given?   (If response is "NO", the following Medicare IM given date fields will be blank) Date Medicare IM given:   Date Additional Medicare IM given:    Discharge Disposition:  HOME/SELF CARE  Per UR Regulation:    If discussed at Long Length of Stay Meetings, dates discussed:    Comments:

## 2012-01-03 NOTE — Progress Notes (Signed)
01/03/12 0900  OT G-codes **NOT FOR INPATIENT CLASS**  Functional Assessment Tool Used clinical judgement  Functional Limitation Self care (clinical judgement)  Self Care Current Status (N8295) CI  Self Care Goal Status (A2130) CI  Self Care Discharge Status (Q6578) CI  OT General Charges  $OT Visit 1 Procedure  OT Evaluation  $Initial OT Evaluation Tier I 1 Procedure  OT Treatments  $Self Care/Home Management  8-22 mins    Gcode : OT Evaluation  Lucile Shutters   OTR/L Pager: [phone] Office: [phone] .

## 2012-03-18 LAB — HM MAMMOGRAPHY: HM Mammogram: NORMAL (ref 0–4)

## 2013-02-26 ENCOUNTER — Other Ambulatory Visit: Payer: Self-pay | Admitting: Family Medicine

## 2013-02-26 LAB — BASIC METABOLIC PANEL
Anion Gap: 3 — ABNORMAL LOW (ref 7–16)
Co2: 31 mmol/L (ref 21–32)
Creatinine: 1.01 mg/dL (ref 0.60–1.30)
EGFR (African American): >60
EGFR (Non-African Amer.): 60 — ABNORMAL LOW
Glucose: 109 mg/dL — ABNORMAL HIGH (ref 65–99)
Potassium: 4.2 mmol/L (ref 3.5–5.1)

## 2013-02-26 LAB — LIPID PANEL
Cholesterol: 129 mg/dL (ref 0–200)
Ldl Cholesterol, Calc: 49 mg/dL (ref 0–100)

## 2013-03-22 ENCOUNTER — Ambulatory Visit: Payer: Self-pay | Admitting: Family Medicine

## 2013-04-20 ENCOUNTER — Emergency Department: Payer: Self-pay | Admitting: Emergency Medicine

## 2013-07-09 ENCOUNTER — Other Ambulatory Visit: Payer: Self-pay | Admitting: Family Medicine

## 2013-07-09 LAB — TSH: Thyroid Stimulating Horm: 3.14 u[IU]/mL

## 2014-06-30 ENCOUNTER — Emergency Department: Admit: 2014-06-30 | Disposition: A | Discharge status: Home / Self Care | Payer: Self-pay | Admitting: Internal Medicine

## 2014-09-01 ENCOUNTER — Ambulatory Visit: Admitting: Family Medicine

## 2014-09-09 ENCOUNTER — Ambulatory Visit: Admitting: Family Medicine

## 2014-09-16 ENCOUNTER — Encounter: Payer: Self-pay | Admitting: Family Medicine

## 2014-09-16 ENCOUNTER — Ambulatory Visit (INDEPENDENT_AMBULATORY_CARE_PROVIDER_SITE_OTHER): Admitting: Family Medicine

## 2014-09-16 VITALS — BP 128/72 | HR 87 | Temp 98.0°F | Resp 16 | Ht 63.0 in | Wt 241.0 lb

## 2014-09-16 DIAGNOSIS — E1169 Type 2 diabetes mellitus with other specified complication: Secondary | ICD-10-CM | POA: Insufficient documentation

## 2014-09-16 DIAGNOSIS — E1142 Type 2 diabetes mellitus with diabetic polyneuropathy: Secondary | ICD-10-CM | POA: Diagnosis not present

## 2014-09-16 DIAGNOSIS — E119 Type 2 diabetes mellitus without complications: Secondary | ICD-10-CM

## 2014-09-16 DIAGNOSIS — E785 Hyperlipidemia, unspecified: Secondary | ICD-10-CM | POA: Diagnosis not present

## 2014-09-16 DIAGNOSIS — I10 Essential (primary) hypertension: Secondary | ICD-10-CM

## 2014-09-16 DIAGNOSIS — K219 Gastro-esophageal reflux disease without esophagitis: Secondary | ICD-10-CM

## 2014-09-16 LAB — POCT GLYCOSYLATED HEMOGLOBIN (HGB A1C): HEMOGLOBIN A1C: 10.3

## 2014-09-16 MED ORDER — INSULIN LISPRO PROT & LISPRO (75-25 MIX) 100 UNIT/ML ~~LOC~~ SUSP
SUBCUTANEOUS | Status: DC
Start: 2014-09-16 — End: 2015-01-24

## 2014-09-16 MED ORDER — GABAPENTIN 100 MG PO CAPS: ORAL_CAPSULE | ORAL | Status: DC

## 2014-09-16 NOTE — Patient Instructions (Signed)
Get a glucometer and check blood sugars twice a day.  Discussed reduction of sugars and starches to reduce weight.

## 2014-09-16 NOTE — Progress Notes (Signed)
Name: Alyssa Perkins   MRN: [id number]    DOB: Jun 20, 1950   Date:09/16/2014       Progress Note  Subjective  Chief Complaint  Chief Complaint  Patient presents with  . Diabetes    not checking BS    HPI  Here for f/u of DM, HBP and elevated lipids.  She does not check BSs at home.  She is not following a diabetic diet.  Aic is 10.3 today. /o restless leg syndrome at hight.  Wants to restart Gabapentin again.  Having Neuroathic pain in feet.  Past Medical History  Diagnosis Date  . Complication of anesthesia     "difficulty waking up from surgery"  . PONV (postoperative nausea and vomiting)   . Anginal pain   . Depression     has OCD, states counts constantly  . Anxiety     not on any medication at this time  . Pneumonia     hx of  . Bronchitis     hx of  . Diabetes mellitus without complication   . Asthma     "sports asthma or exercised induced"seen at [facility] for primary  . GERD (gastroesophageal reflux disease)   . Headache(784.0)     hx of migraines  . Neuromuscular disorder     past hx of carpal tunnel bilaterally  . Arthritis   . Chronic neck pain   . Chronic back pain   . Hyperlipidemia     Past Surgical History  Procedure Laterality Date  . Cardiac catheterization      done approx. 4- 5 years ago, Dr. Leanor Kail, does not see cardi. at this time.  . Dilation and curettage of uterus      "several"  . Hernia repair    . Abdominal hysterectomy      also had oophorectomy  . Cholecystectomy    . Knee surgery      1 partial left, 1 total left  . Anterior cervical decomp/discectomy fusion  01/02/2012    Procedure: ANTERIOR CERVICAL DECOMPRESSION/DISCECTOMY FUSION 2 LEVELS;  Surgeon: Melina Schools, MD;  Location: [facility];  Service: Orthopedics;  Laterality: N/A;  ACDF C5-7    History reviewed. No pertinent family history.  History   Social History  . Marital Status: Married    Spouse Name: N/A  . Number of Children: N/A  .  Years of Education: N/A   Occupational History  . Not on file.   Social History Main Topics  . Smoking status: Former Smoker    Types: Cigarettes    Quit date: 07/17/1994  . Smokeless tobacco: Not on file     Comment: stopped 18 years ago  . Alcohol Use: No  . Drug Use: No  . Sexual Activity: Not on file   Other Topics Concern  . Not on file   Social History Narrative     Current outpatient prescriptions:  .  hydrOXYzine (ATARAX/VISTARIL) 25 MG tablet, Take 25 mg by mouth daily as needed. For itching, Disp: , Rfl:  .  insulin lispro protamine-lispro (HUMALOG 75/25 MIX) (75-25) 100 UNIT/ML SUSP injection, Inject 75 Units into the skin 2 (two) times daily with a meal., Disp: , Rfl:  .  omeprazole (PRILOSEC) 20 MG capsule, Take 20 mg by mouth daily., Disp: , Rfl:  .  quinapril (ACCUPRIL) 20 MG tablet, Take 20 mg by mouth at bedtime., Disp: , Rfl:  .  simvastatin (ZOCOR) 40 MG tablet, Take 40 mg by mouth  at bedtime., Disp: , Rfl:  .  glipiZIDE (GLUCOTROL) 10 MG tablet, Take 10 mg by mouth 2 (two) times daily before a meal., Disp: , Rfl:  .  insulin glargine (LANTUS) 100 UNIT/ML injection, Inject 60 Units into the skin 2 (two) times daily. Takes twice daily per patient, Disp: , Rfl:  .  insulin regular (NOVOLIN R,HUMULIN R) 100 units/mL injection, Inject 75 Units into the skin 3 (three) times daily before meals., Disp: , Rfl:  .  methocarbamol (ROBAXIN) 500 MG tablet, Take 1 tablet (500 mg total) by mouth 3 (three) times daily. MAX 3 pills daily (Patient not taking: Reported on 09/16/2014), Disp: 42 tablet, Rfl: 0 .  ondansetron (ZOFRAN) 4 MG tablet, Take 1 tablet (4 mg total) by mouth every 8 (eight) hours as needed for nausea. MAX 3 pills daily (Patient not taking: Reported on 09/16/2014), Disp: 30 tablet, Rfl: 0 .  oxyCODONE-acetaminophen (PERCOCET) 10-325 MG per tablet, Take 1 tablet by mouth every 6 (six) hours as needed for pain. MAX 4 pills daily (Patient not taking: Reported on  09/16/2014), Disp: 56 tablet, Rfl: 0 .  polyethylene glycol (MIRALAX / GLYCOLAX) packet, Take 17 g by mouth daily. Take 1 packet daily until bowels become regular (Patient not taking: Reported on 09/16/2014), Disp: 14 each, Rfl: 0  Allergies  Allergen Reactions  . Codeine Itching     Review of Systems  Constitutional: Negative.  Negative for fever, chills and malaise/fatigue.  HENT: Negative.  Negative for nosebleeds.   Eyes: Negative.  Negative for blurred vision and double vision.  Respiratory: Negative.  Negative for cough, shortness of breath and wheezing.   Cardiovascular: Negative.  Negative for chest pain, palpitations, orthopnea and leg swelling.  Gastrointestinal: Negative.  Negative for heartburn, nausea, vomiting, abdominal pain, diarrhea and blood in stool.  Genitourinary: Negative.  Negative for dysuria and frequency.  Skin: Negative.  Negative for rash.  Neurological: Negative.  Negative for weakness and headaches.  Psychiatric/Behavioral: Negative.       Objective  Filed Vitals:   09/16/14 1131  BP: 128/72  Pulse: 87  Temp: 98 F (36.7 C)  Resp: 16  Height: 5\' 3"  (1.6 m)  Weight: 241 lb (109.317 kg)    Physical Exam  Constitutional: She is well-developed, well-nourished, and in no distress.  HENT:  Head: Normocephalic and atraumatic.  Eyes: Conjunctivae and EOM are normal. Pupils are equal, round, and reactive to light. No scleral icterus.  Neck: Normal range of motion. Neck supple. No thyromegaly present.  Cardiovascular: Normal rate, regular rhythm, normal heart sounds and intact distal pulses.  Exam reveals no gallop and no friction rub.   Pulmonary/Chest: Effort normal and breath sounds normal. No respiratory distress. She has no wheezes. She has no rales.  Abdominal: Bowel sounds are normal. She exhibits no mass. There is no tenderness.  Musculoskeletal: She exhibits no edema.  Lymphadenopathy:    She has no cervical adenopathy.  Vitals  reviewed.        Assessment & Plan  Problem List Items Addressed This Visit      Cardiovascular and Mediastinum   Hypertension     Digestive   Acid reflux     Endocrine   Diabetes   Relevant Medications   insulin regular (NOVOLIN R,HUMULIN R) 100 units/mL injection   insulin lispro protamine-lispro (HUMALOG 75/25 MIX) (75-25) 100 UNIT/ML SUSP injection   Other Relevant Orders   POCT HgB A1C     Other   Dyslipidemia  Other Visit Diagnoses    Type 2 diabetes mellitus without complication    -  Primary    Relevant Medications    insulin regular (NOVOLIN R,HUMULIN R) 100 units/mL injection    insulin lispro protamine-lispro (HUMALOG 75/25 MIX) (75-25) 100 UNIT/ML SUSP injection    Other Relevant Orders    POCT HgB A1C       Meds ordered this encounter  Medications  . insulin regular (NOVOLIN R,HUMULIN R) 100 units/mL injection    Sig: Inject 75 Units into the skin 3 (three) times daily before meals.  . insulin lispro protamine-lispro (HUMALOG 75/25 MIX) (75-25) 100 UNIT/ML SUSP injection    Sig: Inject 75 Units into the skin 2 (two) times daily with a meal.    1. Type 2 diabetes mellitus without complication  - POCT HgB A1C-10.3. - insulin lispro protamine-lispro (HUMALOG 75/25 MIX) (75-25) 100 UNIT/ML SUSP injection; Use 78 units subcutaneously twice a day.  Dispense: 10 mL; Refill: 12  2. Dyslipidemia -continue Simvastatin  3. Essential hypertension -Continue Quinapril  4. Type 2 diabetes mellitus with diabetic polyneuropathy  - gabapentin (NEURONTIN) 100 MG capsule; Take 3 capsules by mouth three times a day.  Dispense: 710 capsule; Refill: 3  5. Gastroesophageal reflux disease without esophagitis -cont. Omeprazole

## 2015-01-18 ENCOUNTER — Ambulatory Visit: Admitting: Family Medicine

## 2015-01-24 ENCOUNTER — Telehealth: Payer: Self-pay | Admitting: Family Medicine

## 2015-01-24 DIAGNOSIS — Z794 Long term (current) use of insulin: Principal | ICD-10-CM

## 2015-01-24 DIAGNOSIS — E119 Type 2 diabetes mellitus without complications: Secondary | ICD-10-CM

## 2015-01-24 MED ORDER — INSULIN LISPRO PROT & LISPRO (75-25 MIX) 100 UNIT/ML ~~LOC~~ SUSP: SUBCUTANEOUS | Status: DC

## 2015-01-24 NOTE — Telephone Encounter (Signed)
Pt  Requesting a 90 day supply of   novolin called into [place]  Pt called # is  [phone]

## 2015-01-24 NOTE — Telephone Encounter (Signed)
Called patient to find out when she was going to schedule f/u appt. Patient was seen 09/16/14 , she was suppose to be seen in 3 months. Patient is in [place] helping her daughter out for a while and not sure when she will be back. She is requesting rx Humalog be sent to [facility]  90 day supply.

## 2015-01-24 NOTE — Telephone Encounter (Signed)
You can send in Humalog supply for 90 days but without refills.  If she needs more, she would need to return here to see me or get a physician in [place].  Her dose would stay the same.

## 2015-03-14 ENCOUNTER — Other Ambulatory Visit: Payer: Self-pay | Admitting: Family Medicine

## 2015-03-14 ENCOUNTER — Telehealth: Payer: Self-pay | Admitting: Family Medicine

## 2015-03-14 MED ORDER — QUINAPRIL HCL 20 MG PO TABS: 20.0000 mg | ORAL_TABLET | Freq: Every day | ORAL | Status: DC

## 2015-03-14 MED ORDER — OMEPRAZOLE 20 MG PO CPDR: 20.0000 mg | DELAYED_RELEASE_CAPSULE | Freq: Every day | ORAL | Status: DC

## 2015-03-14 MED ORDER — HYDROXYZINE HCL 25 MG PO TABS: 25.0000 mg | ORAL_TABLET | Freq: Every day | ORAL | Status: DC | PRN

## 2015-03-14 NOTE — Telephone Encounter (Signed)
90 days ordered without refills.  Needs office appointment to get any more.-jh

## 2015-03-14 NOTE — Telephone Encounter (Signed)
Pt called requesting a refill on omeprazole 200 mg, hydroxyzine 25mg  HCL (per pt  She  Need HCL), quinapril 20mg   [place]  Pt  Call back # is  [phone]

## 2015-03-15 NOTE — Telephone Encounter (Signed)
Patient aware that 57 has been submitted and she will need an office visit before future refills.

## 2015-03-21 ENCOUNTER — Telehealth: Payer: Self-pay | Admitting: Family Medicine

## 2015-03-21 DIAGNOSIS — Z794 Long term (current) use of insulin: Principal | ICD-10-CM

## 2015-03-21 DIAGNOSIS — E119 Type 2 diabetes mellitus without complications: Secondary | ICD-10-CM

## 2015-03-21 MED ORDER — INSULIN LISPRO PROT & LISPRO (75-25 MIX) 100 UNIT/ML ~~LOC~~ SUSP: SUBCUTANEOUS | Status: DC

## 2015-03-21 NOTE — Telephone Encounter (Signed)
Pt needs a prescription for humalog 75/25 sent to [facility] by mail.  Her call back number is [phone].  She is totally out and had to by a generic at [facility] to get her through

## 2015-03-21 NOTE — Telephone Encounter (Signed)
rx submitted.  

## 2015-05-16 ENCOUNTER — Other Ambulatory Visit: Payer: Self-pay | Admitting: *Deleted

## 2015-05-16 ENCOUNTER — Encounter: Payer: Self-pay | Admitting: Family Medicine

## 2015-05-16 ENCOUNTER — Ambulatory Visit (INDEPENDENT_AMBULATORY_CARE_PROVIDER_SITE_OTHER): Admitting: Family Medicine

## 2015-05-16 VITALS — BP 127/86 | HR 89 | Temp 98.4°F | Resp 16 | Wt 240.6 lb

## 2015-05-16 DIAGNOSIS — R509 Fever, unspecified: Secondary | ICD-10-CM | POA: Diagnosis not present

## 2015-05-16 DIAGNOSIS — A0811 Acute gastroenteropathy due to Norwalk agent: Secondary | ICD-10-CM | POA: Diagnosis not present

## 2015-05-16 DIAGNOSIS — E119 Type 2 diabetes mellitus without complications: Secondary | ICD-10-CM

## 2015-05-16 DIAGNOSIS — E1142 Type 2 diabetes mellitus with diabetic polyneuropathy: Secondary | ICD-10-CM

## 2015-05-16 DIAGNOSIS — Z794 Long term (current) use of insulin: Secondary | ICD-10-CM

## 2015-05-16 LAB — POCT INFLUENZA A/B
INFLUENZA A, POC: NEGATIVE
INFLUENZA B, POC: NEGATIVE

## 2015-05-16 MED ORDER — INSULIN LISPRO PROT & LISPRO (75-25 MIX) 100 UNIT/ML ~~LOC~~ SUSP: SUBCUTANEOUS | Status: DC

## 2015-05-16 MED ORDER — GLIPIZIDE 10 MG PO TABS: 10.0000 mg | ORAL_TABLET | Freq: Two times a day (BID) | ORAL | Status: DC

## 2015-05-16 MED ORDER — RANITIDINE HCL 150 MG PO TABS: 150.0000 mg | ORAL_TABLET | Freq: Two times a day (BID) | ORAL | Status: DC

## 2015-05-16 MED ORDER — OMEPRAZOLE 20 MG PO CPDR: 20.0000 mg | DELAYED_RELEASE_CAPSULE | Freq: Every day | ORAL | Status: DC

## 2015-05-16 MED ORDER — SIMVASTATIN 40 MG PO TABS: 40.0000 mg | ORAL_TABLET | Freq: Every day | ORAL | Status: DC

## 2015-05-16 MED ORDER — QUINAPRIL HCL 20 MG PO TABS: 20.0000 mg | ORAL_TABLET | Freq: Every day | ORAL | Status: DC

## 2015-05-16 MED ORDER — GABAPENTIN 100 MG PO CAPS
ORAL_CAPSULE | ORAL | Status: DC
Start: 2015-05-16 — End: 2015-06-13

## 2015-05-16 NOTE — Patient Instructions (Signed)
Increase liquids.  Eat light.

## 2015-05-16 NOTE — Progress Notes (Signed)
Name: Alyssa Perkins   MRN: [id number]    DOB: 04/12/1950   Date:05/16/2015       Progress Note  Subjective  Chief Complaint  Chief Complaint  Patient presents with  . Nausea    Fever, chills, HA, chest pain that radiates to back, nausea and vomiting x3 days    HPI C/o Nausea with vomiting and diarrhea that occurred 1 week ago for 3 days.  Had a ? Fever 4 days ago.  Still  With some bloating and upper abdominal pain on and off.  Has had normal bowel movements past 2 days.  No blood or melena.  No problem-specific assessment & plan notes found for this encounter.   Past Medical History  Diagnosis Date  . Complication of anesthesia     "difficulty waking up from surgery"  . PONV (postoperative nausea and vomiting)   . Anginal pain ([place])   . Depression     has OCD, states counts constantly  . Anxiety     not on any medication at this time  . Pneumonia     hx of  . Bronchitis     hx of  . Diabetes mellitus without complication ([place])   . Asthma     "sports asthma or exercised induced"seen at [facility] for primary  . GERD (gastroesophageal reflux disease)   . Headache(784.0)     hx of migraines  . Neuromuscular disorder ([place])     past hx of carpal tunnel bilaterally  . Arthritis   . Chronic neck pain   . Chronic back pain   . Hyperlipidemia     Social History  Substance Use Topics  . Smoking status: Former Smoker    Types: Cigarettes    Quit date: 07/17/1994  . Smokeless tobacco: Not on file     Comment: stopped 18 years ago  . Alcohol Use: No     Current outpatient prescriptions:  .  gabapentin (NEURONTIN) 100 MG capsule, Take 3 capsules by mouth three times a day., Disp: 710 capsule, Rfl: 3 .  glipiZIDE (GLUCOTROL) 10 MG tablet, Take 10 mg by mouth 2 (two) times daily before a meal., Disp: , Rfl:  .  hydrOXYzine (ATARAX/VISTARIL) 25 MG tablet, Take 1 tablet (25 mg total) by mouth daily as needed for itching. For itching, Disp: 90 tablet,  Rfl: 0 .  insulin glargine (LANTUS) 100 UNIT/ML injection, Inject 60 Units into the skin 2 (two) times daily. Takes twice daily per patient, Disp: , Rfl:  .  insulin lispro protamine-lispro (HUMALOG 75/25 MIX) (75-25) 100 UNIT/ML SUSP injection, Use 78 units subcutaneously twice a day., Disp: 30 mL, Rfl: 0 .  insulin regular (NOVOLIN R,HUMULIN R) 100 units/mL injection, Inject 75 Units into the skin 3 (three) times daily before meals., Disp: , Rfl:  .  omeprazole (PRILOSEC) 20 MG capsule, Take 1 capsule (20 mg total) by mouth daily., Disp: 90 capsule, Rfl: 0 .  quinapril (ACCUPRIL) 20 MG tablet, Take 1 tablet (20 mg total) by mouth at bedtime., Disp: 90 tablet, Rfl: 0 .  methocarbamol (ROBAXIN) 500 MG tablet, Take 1 tablet (500 mg total) by mouth 3 (three) times daily. MAX 3 pills daily (Patient not taking: Reported on 09/16/2014), Disp: 42 tablet, Rfl: 0 .  ondansetron (ZOFRAN) 4 MG tablet, Take 1 tablet (4 mg total) by mouth every 8 (eight) hours as needed for nausea. MAX 3 pills daily (Patient not taking: Reported on 09/16/2014), Disp: 30 tablet, Rfl: 0 .  oxyCODONE-acetaminophen (PERCOCET) 10-325 MG per tablet, Take 1 tablet by mouth every 6 (six) hours as needed for pain. MAX 4 pills daily (Patient not taking: Reported on 09/16/2014), Disp: 56 tablet, Rfl: 0 .  polyethylene glycol (MIRALAX / GLYCOLAX) packet, Take 17 g by mouth daily. Take 1 packet daily until bowels become regular (Patient not taking: Reported on 09/16/2014), Disp: 14 each, Rfl: 0 .  simvastatin (ZOCOR) 40 MG tablet, Take 40 mg by mouth at bedtime. Reported on 05/16/2015, Disp: , Rfl:   Allergies  Allergen Reactions  . Codeine Itching    Review of Systems  Constitutional: Positive for malaise/fatigue. Negative for fever, chills and weight loss.  HENT: Negative for hearing loss.   Eyes: Negative for blurred vision and double vision.  Respiratory: Negative for cough, shortness of breath and wheezing.   Cardiovascular: Negative for  chest pain, palpitations and leg swelling.  Gastrointestinal: Positive for nausea (1 week ago), vomiting (1 week ago), abdominal pain and diarrhea (1 week ago). Negative for heartburn, constipation, blood in stool and melena.  Genitourinary: Negative for dysuria, urgency and frequency.  Skin: Negative for rash.  Neurological: Positive for weakness. Negative for headaches.      Objective  Filed Vitals:   05/16/15 1617  BP: 127/86  Pulse: 89  Temp: 98.4 F (36.9 C)  TempSrc: Oral  Resp: 16  Weight: 240 lb 9.6 oz (109.135 kg)     Physical Exam  Constitutional: She is oriented to person, place, and time and well-developed, well-nourished, and in no distress.  HENT:  Head: Normocephalic and atraumatic.  Cardiovascular: Normal rate, regular rhythm and normal heart sounds.  Exam reveals no gallop and no friction rub.   No murmur heard. Pulmonary/Chest: Effort normal and breath sounds normal. No respiratory distress. She has no wheezes. She has no rales.  Abdominal: Soft. She exhibits no distension and no mass. There is tenderness (diffuse mild tenderness).  Musculoskeletal: She exhibits no edema.  Neurological: She is alert and oriented to person, place, and time.  Vitals reviewed.     Recent Results (from the past 2160 hour(s))  POCT Influenza A/B     Status: Normal   Collection Time: 05/16/15  4:39 PM  Result Value Ref Range   Influenza A, POC Negative Negative   Influenza B, POC Negative Negative     Assessment & Plan  1. Chills with fever  - POCT Influenza A/B-neg for A and B  2. Viral gastroenteritis due to [place]-like agent  - ranitidine (ZANTAC) 150 MG tablet; Take 1 tablet (150 mg total) by mouth 2 (two) times daily.  Dispense: 30 tablet; Refill: 1

## 2015-06-13 ENCOUNTER — Ambulatory Visit: Payer: Self-pay | Admitting: Family Medicine

## 2015-06-13 ENCOUNTER — Encounter: Payer: Self-pay | Admitting: Family Medicine

## 2015-06-13 ENCOUNTER — Ambulatory Visit (INDEPENDENT_AMBULATORY_CARE_PROVIDER_SITE_OTHER): Admitting: Family Medicine

## 2015-06-13 VITALS — BP 118/70 | HR 93 | Temp 98.8°F | Resp 18 | Ht 63.0 in | Wt 240.0 lb

## 2015-06-13 DIAGNOSIS — E1140 Type 2 diabetes mellitus with diabetic neuropathy, unspecified: Secondary | ICD-10-CM | POA: Insufficient documentation

## 2015-06-13 DIAGNOSIS — E785 Hyperlipidemia, unspecified: Secondary | ICD-10-CM | POA: Diagnosis not present

## 2015-06-13 DIAGNOSIS — E08311 Diabetes mellitus due to underlying condition with unspecified diabetic retinopathy with macular edema: Secondary | ICD-10-CM | POA: Diagnosis not present

## 2015-06-13 DIAGNOSIS — E119 Type 2 diabetes mellitus without complications: Secondary | ICD-10-CM | POA: Diagnosis not present

## 2015-06-13 DIAGNOSIS — E1165 Type 2 diabetes mellitus with hyperglycemia: Secondary | ICD-10-CM

## 2015-06-13 DIAGNOSIS — I10 Essential (primary) hypertension: Secondary | ICD-10-CM

## 2015-06-13 DIAGNOSIS — E1149 Type 2 diabetes mellitus with other diabetic neurological complication: Secondary | ICD-10-CM | POA: Insufficient documentation

## 2015-06-13 DIAGNOSIS — Z794 Long term (current) use of insulin: Secondary | ICD-10-CM | POA: Diagnosis not present

## 2015-06-13 DIAGNOSIS — E1142 Type 2 diabetes mellitus with diabetic polyneuropathy: Secondary | ICD-10-CM

## 2015-06-13 LAB — POCT GLYCOSYLATED HEMOGLOBIN (HGB A1C): Hemoglobin A1C: 8.8

## 2015-06-13 MED ORDER — GABAPENTIN 100 MG PO CAPS: ORAL_CAPSULE | ORAL | Status: DC

## 2015-06-13 MED ORDER — INSULIN LISPRO PROT & LISPRO (75-25 MIX) 100 UNIT/ML ~~LOC~~ SUSP: SUBCUTANEOUS | Status: DC

## 2015-06-13 NOTE — Progress Notes (Signed)
Name: Alyssa Perkins   MRN: [id number]    DOB: 1950/08/29   Date:06/13/2015       Progress Note  Subjective  Chief Complaint  Chief Complaint  Patient presents with  . Diabetes    Follow up and A1c check    HPI Here to f/u DM.  She is confused about her meds.  We have listed Humalog, 75/25, but she thinks it is Novolin or Novolog.  She takes 78 units twice a dsy.  She does not check her sugars at home.  Does not have a working glucometer.  Gets meds from [facility].  Has gotten suppl of Glipizide in mail order.  Unsure of why.  She has not taken any.  She says that she still has tingling in feet.    No problem-specific assessment & plan notes found for this encounter.   Past Medical History  Diagnosis Date  . Complication of anesthesia     "difficulty waking up from surgery"  . PONV (postoperative nausea and vomiting)   . Anginal pain ([place])   . Depression     has OCD, states counts constantly  . Anxiety     not on any medication at this time  . Pneumonia     hx of  . Bronchitis     hx of  . Diabetes mellitus without complication ([place])   . Asthma     "sports asthma or exercised induced"seen at [facility] for primary  . GERD (gastroesophageal reflux disease)   . Headache(784.0)     hx of migraines  . Neuromuscular disorder ([place])     past hx of carpal tunnel bilaterally  . Arthritis   . Chronic neck pain   . Chronic back pain   . Hyperlipidemia     Past Surgical History  Procedure Laterality Date  . Cardiac catheterization      done approx. 4- 5 years ago, Dr. Leanor Kail, does not see cardi. at this time.  . Dilation and curettage of uterus      "several"  . Hernia repair    . Abdominal hysterectomy      also had oophorectomy  . Cholecystectomy    . Knee surgery      1 partial left, 1 total left  . Anterior cervical decomp/discectomy fusion  01/02/2012    Procedure: ANTERIOR CERVICAL DECOMPRESSION/DISCECTOMY FUSION 2 LEVELS;  Surgeon:  Melina Schools, MD;  Location: [facility];  Service: Orthopedics;  Laterality: N/A;  ACDF C5-7    History reviewed. No pertinent family history.  Social History   Social History  . Marital Status: Married    Spouse Name: N/A  . Number of Children: N/A  . Years of Education: N/A   Occupational History  . Not on file.   Social History Main Topics  . Smoking status: Former Smoker    Types: Cigarettes    Quit date: 07/17/1994  . Smokeless tobacco: Not on file     Comment: stopped 18 years ago  . Alcohol Use: No  . Drug Use: No  . Sexual Activity: Not on file   Other Topics Concern  . Not on file   Social History Narrative     Current outpatient prescriptions:  .  gabapentin (NEURONTIN) 100 MG capsule, Take 4 capsules by mouth three times a day., Disp: 480 capsule, Rfl: 3 .  hydrOXYzine (ATARAX/VISTARIL) 25 MG tablet, Take 1 tablet (25 mg total) by mouth daily as needed for itching. For itching,  Disp: 90 tablet, Rfl: 0 .  insulin lispro protamine-lispro (HUMALOG 75/25 MIX) (75-25) 100 UNIT/ML SUSP injection, Use 80 units subcutaneously twice a day., Disp: 30 mL, Rfl: 1 .  omeprazole (PRILOSEC) 20 MG capsule, Take 1 capsule (20 mg total) by mouth daily., Disp: 90 capsule, Rfl: 2 .  quinapril (ACCUPRIL) 20 MG tablet, Take 1 tablet (20 mg total) by mouth at bedtime., Disp: 90 tablet, Rfl: 1 .  simvastatin (ZOCOR) 40 MG tablet, Take 1 tablet (40 mg total) by mouth at bedtime. Reported on 05/16/2015, Disp: 30 tablet, Rfl: 1 .  glipiZIDE (GLUCOTROL) 10 MG tablet, Take 1 tablet (10 mg total) by mouth 2 (two) times daily before a meal. (Patient not taking: Reported on 06/13/2015), Disp: 60 tablet, Rfl: 3 .  methocarbamol (ROBAXIN) 500 MG tablet, Take 1 tablet (500 mg total) by mouth 3 (three) times daily. MAX 3 pills daily (Patient not taking: Reported on 09/16/2014), Disp: 42 tablet, Rfl: 0 .  ondansetron (ZOFRAN) 4 MG tablet, Take 1 tablet (4 mg total) by mouth every 8 (eight) hours as needed  for nausea. MAX 3 pills daily (Patient not taking: Reported on 09/16/2014), Disp: 30 tablet, Rfl: 0 .  oxyCODONE-acetaminophen (PERCOCET) 10-325 MG per tablet, Take 1 tablet by mouth every 6 (six) hours as needed for pain. MAX 4 pills daily (Patient not taking: Reported on 09/16/2014), Disp: 56 tablet, Rfl: 0 .  polyethylene glycol (MIRALAX / GLYCOLAX) packet, Take 17 g by mouth daily. Take 1 packet daily until bowels become regular (Patient not taking: Reported on 09/16/2014), Disp: 14 each, Rfl: 0 .  ranitidine (ZANTAC) 150 MG tablet, Take 1 tablet (150 mg total) by mouth 2 (two) times daily. (Patient not taking: Reported on 06/13/2015), Disp: 30 tablet, Rfl: 1  Allergies  Allergen Reactions  . Codeine Itching     Review of Systems  Constitutional: Negative for fever, chills, weight loss and malaise/fatigue.  HENT: Negative for hearing loss.   Eyes: Negative for blurred vision and double vision.  Respiratory: Negative for cough, shortness of breath and wheezing.   Cardiovascular: Negative for chest pain, palpitations and leg swelling.  Gastrointestinal: Negative for heartburn, abdominal pain and blood in stool.  Genitourinary: Negative for dysuria, urgency and frequency.  Musculoskeletal: Positive for joint pain.  Skin: Positive for itching and rash.  Neurological: Negative for tremors, weakness and headaches.      Objective  Filed Vitals:   06/13/15 1518  BP: 118/70  Pulse: 93  Temp: 98.8 F (37.1 C)  TempSrc: Oral  Resp: 18  Height: 5\' 3"  (1.6 m)  Weight: 240 lb (108.863 kg)    Physical Exam  Constitutional: She is oriented to person, place, and time and well-developed, well-nourished, and in no distress. No distress.  HENT:  Head: Normocephalic and atraumatic.  Eyes: Conjunctivae and EOM are normal. Pupils are equal, round, and reactive to light. No scleral icterus.  Neck: Normal range of motion. Neck supple. Carotid bruit is not present. No thyromegaly present.   Cardiovascular: Normal rate, regular rhythm and normal heart sounds.  Exam reveals no gallop and no friction rub.   No murmur heard. Pulmonary/Chest: Effort normal and breath sounds normal. No respiratory distress. She has no wheezes. She has no rales.  Abdominal: Soft. Bowel sounds are normal. She exhibits no distension, no abdominal bruit and no mass. There is no tenderness.  Musculoskeletal: She exhibits no edema.  Lymphadenopathy:    She has no cervical adenopathy.  Neurological: She is alert and  oriented to person, place, and time.  She reports numbness and tingling in bilateral feet and toes.  Vitals reviewed.      Recent Results (from the past 2160 hour(s))  POCT Influenza A/B     Status: Normal   Collection Time: 05/16/15  4:39 PM  Result Value Ref Range   Influenza A, POC Negative Negative   Influenza B, POC Negative Negative     Assessment & Plan  Problem List Items Addressed This Visit      Cardiovascular and Mediastinum   Hypertension     Endocrine   Diabetes ([place])   Relevant Medications   insulin lispro protamine-lispro (HUMALOG 75/25 MIX) (75-25) 100 UNIT/ML SUSP injection   gabapentin (NEURONTIN) 100 MG capsule   Diabetic peripheral neuropathy ([facility]) - Primary   Relevant Medications   insulin lispro protamine-lispro (HUMALOG 75/25 MIX) (75-25) 100 UNIT/ML SUSP injection   gabapentin (NEURONTIN) 100 MG capsule     Other   Dyslipidemia      Meds ordered this encounter  Medications  . insulin lispro protamine-lispro (HUMALOG 75/25 MIX) (75-25) 100 UNIT/ML SUSP injection    Sig: Use 80 units subcutaneously twice a day.    Dispense:  30 mL    Refill:  1  . gabapentin (NEURONTIN) 100 MG capsule    Sig: Take 4 capsules by mouth three times a day.    Dispense:  480 capsule    Refill:  3     3. Essential hypertension Cont meds 4. Dyslipidemia Cont. med  5. Type 2 diabetes mellitus without complication, with long-term current use of insulin  ([facility])  - insulin lispro protamine-lispro (HUMALOG 75/25 MIX) (75-25) 100 UNIT/ML SUSP injection; Use 80 units subcutaneously twice a day.  Dispense: 30 mL; Refill: 1 (increased from 78 units twice a day).  6. Type 2 diabetes mellitus with diabetic polyneuropathy, unspecified long term insulin use status ([facility])  - gabapentin (NEURONTIN) 100 MG capsule; Take 4 capsules by mouth three times a day.  Dispense: 480 capsule; Refill: 3 (increased from 3 caps 3 timeds a day.

## 2015-06-13 NOTE — Addendum Note (Signed)
Addended by: Frederich Cha D on: 06/13/2015 04:29 PM   Modules accepted: Orders

## 2015-06-13 NOTE — Patient Instructions (Signed)
Get Glucometer from [place].    Bring list of blood sugars on next return.

## 2015-07-25 ENCOUNTER — Telehealth: Payer: Self-pay | Admitting: Family Medicine

## 2015-07-25 MED ORDER — GLUCOSE BLOOD VI STRP: ORAL_STRIP | Status: DC

## 2015-07-25 MED ORDER — ONETOUCH LANCETS MISC: 1.0000 | Freq: Two times a day (BID) | Status: DC

## 2015-07-25 NOTE — Telephone Encounter (Signed)
Pt has a One Touch Ultra Mini meter and needs refill on strips and lancets sent to [facility].  Her call back number is [phone]

## 2015-08-17 ENCOUNTER — Ambulatory Visit: Admitting: Family Medicine

## 2015-09-06 ENCOUNTER — Telehealth: Payer: Self-pay | Admitting: Family Medicine

## 2015-09-06 DIAGNOSIS — Z794 Long term (current) use of insulin: Principal | ICD-10-CM

## 2015-09-06 DIAGNOSIS — E119 Type 2 diabetes mellitus without complications: Secondary | ICD-10-CM

## 2015-09-06 NOTE — Telephone Encounter (Signed)
Pt needs refills on humalog 75/25, simvastatin 40 mg and hydroxyzine.  She wants the hydroxzine in the pill for and not capsule.  Please fax refills to [facility].  Her call back number is [phone]

## 2015-09-06 NOTE — Telephone Encounter (Signed)
Called pt to schedule appointment she no Showed for 3 month follow up and denied to schedule any appointment she was out of town for 06/01 appointment , has family visiting from  [place] and can't schedule any appointment but just needs refill also advised pt that it is necessary to have 3 month follow up that way we can check all her levels but still denies for appointment.

## 2015-09-07 MED ORDER — HYDROXYZINE HCL 25 MG PO TABS: 25.0000 mg | ORAL_TABLET | Freq: Every day | ORAL | Status: DC | PRN

## 2015-09-07 MED ORDER — SIMVASTATIN 40 MG PO TABS: 40.0000 mg | ORAL_TABLET | Freq: Every day | ORAL | Status: DC

## 2015-09-07 MED ORDER — INSULIN LISPRO PROT & LISPRO (75-25 MIX) 100 UNIT/ML ~~LOC~~ SUSP: SUBCUTANEOUS | Status: DC

## 2015-09-07 NOTE — Telephone Encounter (Signed)
Called patient to notify rx refills submitted to  [facility]. Explained to her she must schedule office visit before future refill b/c Dr. Luan Pulling will NOT authorize any more. Explained to her he is only working M,T, Th so she does not need to wait until last minute to schedule. Patient verbalizes understanding.

## 2015-09-07 NOTE — Telephone Encounter (Signed)
OK to refill 90 day supply of each med needed today without refills.  She needs to be seen before she can get more medication.-jh

## 2015-09-30 ENCOUNTER — Emergency Department

## 2015-09-30 DIAGNOSIS — Z79899 Other long term (current) drug therapy: Secondary | ICD-10-CM | POA: Insufficient documentation

## 2015-09-30 DIAGNOSIS — Z87891 Personal history of nicotine dependence: Secondary | ICD-10-CM | POA: Insufficient documentation

## 2015-09-30 DIAGNOSIS — E785 Hyperlipidemia, unspecified: Secondary | ICD-10-CM | POA: Diagnosis not present

## 2015-09-30 DIAGNOSIS — G8929 Other chronic pain: Secondary | ICD-10-CM | POA: Insufficient documentation

## 2015-09-30 DIAGNOSIS — R51 Headache: Secondary | ICD-10-CM | POA: Insufficient documentation

## 2015-09-30 DIAGNOSIS — E119 Type 2 diabetes mellitus without complications: Secondary | ICD-10-CM | POA: Diagnosis not present

## 2015-09-30 DIAGNOSIS — F329 Major depressive disorder, single episode, unspecified: Secondary | ICD-10-CM | POA: Insufficient documentation

## 2015-09-30 DIAGNOSIS — M1990 Unspecified osteoarthritis, unspecified site: Secondary | ICD-10-CM | POA: Diagnosis not present

## 2015-09-30 DIAGNOSIS — Z794 Long term (current) use of insulin: Secondary | ICD-10-CM | POA: Diagnosis not present

## 2015-09-30 DIAGNOSIS — M542 Cervicalgia: Secondary | ICD-10-CM | POA: Diagnosis not present

## 2015-09-30 DIAGNOSIS — R0789 Other chest pain: Secondary | ICD-10-CM | POA: Diagnosis present

## 2015-09-30 DIAGNOSIS — Z7984 Long term (current) use of oral hypoglycemic drugs: Secondary | ICD-10-CM | POA: Insufficient documentation

## 2015-09-30 DIAGNOSIS — J45909 Unspecified asthma, uncomplicated: Secondary | ICD-10-CM | POA: Insufficient documentation

## 2015-09-30 LAB — BASIC METABOLIC PANEL
Anion gap: 9 (ref 5–15)
BUN: 13 mg/dL (ref 6–20)
CALCIUM: 8.2 mg/dL — AB (ref 8.9–10.3)
CO2: 22 mmol/L (ref 22–32)
CREATININE: 1.31 mg/dL — AB (ref 0.44–1.00)
Chloride: 107 mmol/L (ref 101–111)
GFR calc Af Amer: 49 mL/min — ABNORMAL LOW (ref >60)
GFR calc non Af Amer: 42 mL/min — ABNORMAL LOW (ref >60)
GLUCOSE: 353 mg/dL — AB (ref 65–99)
POTASSIUM: 4 mmol/L (ref 3.5–5.1)
SODIUM: 138 mmol/L (ref 135–145)

## 2015-09-30 LAB — CBC
HCT: 39.8 % (ref 35.0–47.0)
Hemoglobin: 13.3 g/dL (ref 12.0–16.0)
MCH: 27.9 pg (ref 26.0–34.0)
MCHC: 33.5 g/dL (ref 32.0–36.0)
MCV: 83.3 fL (ref 80.0–100.0)
PLATELETS: 251 10*3/uL (ref 150–440)
RBC: 4.77 MIL/uL (ref 3.80–5.20)
RDW: 14.8 % — ABNORMAL HIGH (ref 11.5–14.5)
WBC: 12.7 10*3/uL — ABNORMAL HIGH (ref 3.6–11.0)

## 2015-09-30 LAB — TROPONIN I

## 2015-09-30 NOTE — ED Notes (Signed)
Pt states onset of central chest pain radiating down right arm with shortness of breath this pm while driving. Pt appears in no acute distress. Skin pwd, resps unlabored. Pt states she became diaphoretic with onset of pain.

## 2015-10-01 ENCOUNTER — Emergency Department
Admission: EM | Admit: 2015-10-01 | Discharge: 2015-10-01 | Disposition: A | Discharge status: Home / Self Care | Attending: Emergency Medicine | Admitting: Emergency Medicine

## 2015-10-01 ENCOUNTER — Emergency Department

## 2015-10-01 ENCOUNTER — Encounter: Payer: Self-pay | Admitting: Emergency Medicine

## 2015-10-01 DIAGNOSIS — R079 Chest pain, unspecified: Secondary | ICD-10-CM

## 2015-10-01 LAB — TROPONIN I: Troponin I: <0.03 ng/mL (ref <0.03)

## 2015-10-01 LAB — FIBRIN DERIVATIVES D-DIMER (ARMC ONLY): FIBRIN DERIVATIVES D-DIMER (ARMC): 410 (ref 0–499)

## 2015-10-01 LAB — CK: CK TOTAL: 144 U/L (ref 38–234)

## 2015-10-01 MED ORDER — DIAZEPAM 5 MG/ML IJ SOLN
2.0000 mg | Freq: Once | INTRAMUSCULAR | Status: AC
  Administered 2015-10-01: 2 mg via INTRAVENOUS
  Filled 2015-10-01: qty 2

## 2015-10-01 MED ORDER — KETOROLAC TROMETHAMINE 30 MG/ML IJ SOLN
30.0000 mg | Freq: Once | INTRAMUSCULAR | Status: AC
  Administered 2015-10-01: 30 mg via INTRAVENOUS
  Filled 2015-10-01: qty 1

## 2015-10-01 MED ORDER — TRAMADOL HCL 50 MG PO TABS: 50.0000 mg | ORAL_TABLET | Freq: Four times a day (QID) | ORAL | Status: DC | PRN

## 2015-10-01 NOTE — ED Notes (Signed)
Patient returned to ED from ultrasound at this time. Awaiting results.

## 2015-10-01 NOTE — ED Notes (Signed)

## 2015-10-01 NOTE — ED Notes (Signed)
Patient to ultrasound at this time.

## 2015-10-01 NOTE — ED Provider Notes (Signed)
[facility] Emergency Department Provider Note   ____________________________________________  Time seen: Approximately 2:08 AM  I have reviewed the triage vital signs and the nursing notes.   HISTORY  Chief Complaint Chest Pain    HPI Alyssa Perkins is a 65 y.o. female who presents to the ED from home with a chief complain of chest pain. Patient has a history of diabetes, hyperlipidemia, prior anginal pain who states onset of pain approximately 3 PM after eating chicken and rice. Describes central chest pressure which radiated down her right arm with mild shortness of breath.Denies associated diaphoresis, nausea, vomiting or dizziness. Denies recent fever, chills, dysuria, diarrhea. Denies recent travel or trauma. Nothing makes her symptoms better. Movement makes her symptoms worse. Also complaining of chronic posterior neck and head pain with muscle spasms. Also notes she has had a two-week history of intermittent left calf pain.   Past Medical History  Diagnosis Date  . Complication of anesthesia     "difficulty waking up from surgery"  . PONV (postoperative nausea and vomiting)   . Anginal pain ([place])   . Depression     has OCD, states counts constantly  . Anxiety     not on any medication at this time  . Pneumonia     hx of  . Bronchitis     hx of  . Diabetes mellitus without complication ([place])   . Asthma     "sports asthma or exercised induced"seen at [facility] for primary  . GERD (gastroesophageal reflux disease)   . Headache(784.0)     hx of migraines  . Neuromuscular disorder ([place])     past hx of carpal tunnel bilaterally  . Arthritis   . Chronic neck pain   . Chronic back pain   . Hyperlipidemia     Patient Active Problem List   Diagnosis Date Noted  . Diabetic peripheral neuropathy ([place]) 06/13/2015  . Chills with fever 05/16/2015  . Viral gastroenteritis due to [place]-like agent 05/16/2015  .  Dyslipidemia 09/16/2014  . Hypertension 09/16/2014  . Diabetes ([place]) 09/16/2014  . Acid reflux 09/16/2014  . Cervical radiculopathy 12/30/2011    Past Surgical History  Procedure Laterality Date  . Cardiac catheterization      done approx. 4- 5 years ago, Dr. Leanor Kail, does not see cardi. at this time.  . Dilation and curettage of uterus      "several"  . Hernia repair    . Abdominal hysterectomy      also had oophorectomy  . Cholecystectomy    . Knee surgery      1 partial left, 1 total left  . Anterior cervical decomp/discectomy fusion  01/02/2012    Procedure: ANTERIOR CERVICAL DECOMPRESSION/DISCECTOMY FUSION 2 LEVELS;  Surgeon: Melina Schools, MD;  Location: [facility];  Service: Orthopedics;  Laterality: N/A;  ACDF C5-7    Current Outpatient Rx  Name  Route  Sig  Dispense  Refill  . gabapentin (NEURONTIN) 100 MG capsule      Take 4 capsules by mouth three times a day.   480 capsule   3   . glipiZIDE (GLUCOTROL) 10 MG tablet   Oral   Take 10 mg by mouth daily before breakfast.         . hydrOXYzine (ATARAX/VISTARIL) 25 MG tablet   Oral   Take 1 tablet (25 mg total) by mouth daily as needed for itching. For itching Patient taking differently: Take 25 mg by mouth  every 6 (six) hours as needed for anxiety or itching. For itching   90 tablet   0     Patient needs appointment to get more   . insulin lispro protamine-lispro (HUMALOG 75/25 MIX) (75-25) 100 UNIT/ML SUSP injection      Use 80 units subcutaneously twice a day.   90 mL   0     No Future Refills patient must schedule appointmen ...   . quinapril (ACCUPRIL) 20 MG tablet   Oral   Take 1 tablet (20 mg total) by mouth at bedtime.   90 tablet   1   . ranitidine (ZANTAC) 150 MG tablet   Oral   Take 150 mg by mouth 2 (two) times daily.         . simvastatin (ZOCOR) 40 MG tablet   Oral   Take 1 tablet (40 mg total) by mouth at bedtime. Reported on 05/16/2015   90 tablet   0     No Future  refills patient must schedule appointmen ...   . glucose blood test strip      Check Blood sugar twice daily. Dx. E11.9   100 each   12   . omeprazole (PRILOSEC) 20 MG capsule   Oral   Take 1 capsule (20 mg total) by mouth daily. Patient not taking: Reported on 10/01/2015   90 capsule   2   . ONE TOUCH LANCETS MISC   Does not apply   1 each by Does not apply route 2 (two) times daily. Dx E11.9   200 each   12   . oxyCODONE-acetaminophen (PERCOCET) 10-325 MG per tablet   Oral   Take 1 tablet by mouth every 6 (six) hours as needed for pain. MAX 4 pills daily Patient not taking: Reported on 09/16/2014   56 tablet   0     Allergies Codeine  History reviewed. No pertinent family history.  Social History Social History  Substance Use Topics  . Smoking status: Former Smoker    Types: Cigarettes    Quit date: 07/17/1994  . Smokeless tobacco: None     Comment: stopped 18 years ago  . Alcohol Use: No    Review of Systems  Constitutional: No fever/chills. Eyes: No visual changes. ENT: No sore throat. Cardiovascular: Positive for chest pain. Respiratory: Denies shortness of breath. Gastrointestinal: No abdominal pain.  No nausea, no vomiting.  No diarrhea.  No constipation. Genitourinary: Negative for dysuria. Musculoskeletal: Positive for chronic neck and head pain. Negative for back pain. Skin: Negative for rash. Neurological: Negative for headaches, focal weakness or numbness.  10-point ROS otherwise negative.  ____________________________________________   PHYSICAL EXAM:  VITAL SIGNS: ED Triage Vitals  Enc Vitals Group     BP 09/30/15 2048 144/68 mmHg     Pulse Rate 09/30/15 2048 96     Resp 09/30/15 2048 18     Temp 09/30/15 2048 98.2 F (36.8 C)     Temp Source 09/30/15 2048 Oral     SpO2 09/30/15 2048 96 %     Weight 09/30/15 2048 237 lb (107.502 kg)     Height 09/30/15 2048 5\' 3"  (1.6 m)     Head Cir --      Peak Flow --      Pain Score  09/30/15 2049 7     Pain Loc --      Pain Edu? --      Excl. in [place]? --     Constitutional: Alert  and oriented. Well appearing and in no acute distress. Eyes: Conjunctivae are normal. PERRL. EOMI. Head: Atraumatic. Nose: No congestion/rhinnorhea. Mouth/Throat: Mucous membranes are moist.  Oropharynx non-erythematous. Neck: No stridor.  No cervical spine tenderness to palpation.  Paraspinal cervical muscle spasms. Cardiovascular: Normal rate, regular rhythm. Grossly normal heart sounds.  Good peripheral circulation. Respiratory: Normal respiratory effort.  No retractions. Lungs CTAB. Anterior chest wall tender to palpation. Gastrointestinal: Soft and nontender. No distention. No abdominal bruits. No CVA tenderness. Musculoskeletal: No lower extremity tenderness nor edema.  No joint effusions. Neurologic:  Normal speech and language. No gross focal neurologic deficits are appreciated. No gait instability. Skin:  Skin is warm, dry and intact. No rash noted. Psychiatric: Mood and affect are normal. Speech and behavior are normal.  ____________________________________________   LABS (all labs ordered are listed, but only abnormal results are displayed)  Labs Reviewed  BASIC METABOLIC PANEL - Abnormal; Notable for the following:    Glucose, Bld 353 (*)    Creatinine, Ser 1.31 (*)    Calcium 8.2 (*)    GFR calc non Af Amer 42 (*)    GFR calc Af Amer 49 (*)    All other components within normal limits  CBC - Abnormal; Notable for the following:    WBC 12.7 (*)    RDW 14.8 (*)    All other components within normal limits  TROPONIN I  TROPONIN I  FIBRIN DERIVATIVES D-DIMER ([facility] ONLY)  CK   ____________________________________________  EKG  ED ECG REPORT I, Gleason Ardoin J, the attending physician, personally viewed and interpreted this ECG.   Date: 10/01/2015  EKG Time: 2045  Rate: 95  Rhythm: normal EKG, normal sinus rhythm  Axis: LAD  Intervals:none  ST&T Change:  Nonspecific  ____________________________________________  RADIOLOGY  Chest 2 view (view by me, interpreted per Dr. Melanee Spry): No active cardiopulmonary disease.  Left lower extremity venous Doppler ultrasound interpreted per Dr. Gerilyn Nestle: No evidence of deep venous thrombosis. ____________________________________________   PROCEDURES  Procedure(s) performed: None  Procedures  Critical Care performed: No  ____________________________________________   INITIAL IMPRESSION / ASSESSMENT AND PLAN / ED COURSE  Pertinent labs & imaging results that were available during my care of the patient were reviewed by me and considered in my medical decision making (see chart for details).  65 year old female who presents with chest pain 12 hours. 2 sets of troponins are negative. Will obtain Doppler ultrasound of left lower extremity for complaints of intermittent calf pain. Will also administer analgesia and muscle relaxer for chest pain and neck spasms.  ----------------------------------------- 5:08 AM on 10/01/2015 -----------------------------------------  Patient is feeling much better. Updated patient of ultrasound and laboratory results. Repeat troponin remains negative. Strict return precautions given. Patient verbalizes understanding and agrees with plan of care. ____________________________________________   FINAL CLINICAL IMPRESSION(S) / ED DIAGNOSES  Final diagnoses:  Chest pain, unspecified chest pain type      NEW MEDICATIONS STARTED DURING THIS VISIT:  New Prescriptions   No medications on file     Note:  This document was prepared using Dragon voice recognition software and may include unintentional dictation errors.    Paulette Blanch, MD 10/01/15 [phone]

## 2015-10-01 NOTE — Discharge Instructions (Signed)
Return to the ER for worsening symptoms, persistent vomiting, difficulty breathing or other concerns.  Nonspecific Chest Pain  Chest pain can be caused by many different conditions. There is always a chance that your pain could be related to something serious, such as a heart attack or a blood clot in your lungs. Chest pain can also be caused by conditions that are not life-threatening. If you have chest pain, it is very important to follow up with your health care provider. CAUSES  Chest pain can be caused by:  Heartburn.  Pneumonia or bronchitis.  Anxiety or stress.  Inflammation around your heart (pericarditis) or lung (pleuritis or pleurisy).  A blood clot in your lung.  A collapsed lung (pneumothorax). It can develop suddenly on its own (spontaneous pneumothorax) or from trauma to the chest.  Shingles infection (varicella-zoster virus).  Heart attack.  Damage to the bones, muscles, and cartilage that make up your chest wall. This can include:  Bruised bones due to injury.  Strained muscles or cartilage due to frequent or repeated coughing or overwork.  Fracture to one or more ribs.  Sore cartilage due to inflammation (costochondritis). RISK FACTORS  Risk factors for chest pain may include:  Activities that increase your risk for trauma or injury to your chest.  Respiratory infections or conditions that cause frequent coughing.  Medical conditions or overeating that can cause heartburn.  Heart disease or family history of heart disease.  Conditions or health behaviors that increase your risk of developing a blood clot.  Having had chicken pox (varicella zoster). SIGNS AND SYMPTOMS Chest pain can feel like:  Burning or tingling on the surface of your chest or deep in your chest.  Crushing, pressure, aching, or squeezing pain.  Dull or sharp pain that is worse when you move, cough, or take a deep breath.  Pain that is also felt in your back, neck, shoulder, or  arm, or pain that spreads to any of these areas. Your chest pain may come and go, or it may stay constant. DIAGNOSIS Lab tests or other studies may be needed to find the cause of your pain. Your health care provider may have you take a test called an ambulatory ECG (electrocardiogram). An ECG records your heartbeat patterns at the time the test is performed. You may also have other tests, such as:  Transthoracic echocardiogram (TTE). During echocardiography, sound waves are used to create a picture of all of the heart structures and to look at how blood flows through your heart.  Transesophageal echocardiogram (TEE).This is a more advanced imaging test that obtains images from inside your body. It allows your health care provider to see your heart in finer detail.  Cardiac monitoring. This allows your health care provider to monitor your heart rate and rhythm in real time.  Holter monitor. This is a portable device that records your heartbeat and can help to diagnose abnormal heartbeats. It allows your health care provider to track your heart activity for several days, if needed.  Stress tests. These can be done through exercise or by taking medicine that makes your heart beat more quickly.  Blood tests.  Imaging tests. TREATMENT  Your treatment depends on what is causing your chest pain. Treatment may include:  Medicines. These may include:  Acid blockers for heartburn.  Anti-inflammatory medicine.  Pain medicine for inflammatory conditions.  Antibiotic medicine, if an infection is present.  Medicines to dissolve blood clots.  Medicines to treat coronary artery disease.  Supportive care  for conditions that do not require medicines. This may include:  Resting.  Applying heat or cold packs to injured areas.  Limiting activities until pain decreases. HOME CARE INSTRUCTIONS  If you were prescribed an antibiotic medicine, finish it all even if you start to feel  better.  Avoid any activities that bring on chest pain.  Do not use any tobacco products, including cigarettes, chewing tobacco, or electronic cigarettes. If you need help quitting, ask your health care provider.  Do not drink alcohol.  Take medicines only as directed by your health care provider.  Keep all follow-up visits as directed by your health care provider. This is important. This includes any further testing if your chest pain does not go away.  If heartburn is the cause for your chest pain, you may be told to keep your head raised (elevated) while sleeping. This reduces the chance that acid will go from your stomach into your esophagus.  Make lifestyle changes as directed by your health care provider. These may include:  Getting regular exercise. Ask your health care provider to suggest some activities that are safe for you.  Eating a heart-healthy diet. A registered dietitian can help you to learn healthy eating options.  Maintaining a healthy weight.  Managing diabetes, if necessary.  Reducing stress. SEEK MEDICAL CARE IF:  Your chest pain does not go away after treatment.  You have a rash with blisters on your chest.  You have a fever. SEEK IMMEDIATE MEDICAL CARE IF:   Your chest pain is worse.  You have an increasing cough, or you cough up blood.  You have severe abdominal pain.  You have severe weakness.  You faint.  You have chills.  You have sudden, unexplained chest discomfort.  You have sudden, unexplained discomfort in your arms, back, neck, or jaw.  You have shortness of breath at any time.  You suddenly start to sweat, or your skin gets clammy.  You feel nauseous or you vomit.  You suddenly feel light-headed or dizzy.  Your heart begins to beat quickly, or it feels like it is skipping beats. These symptoms may represent a serious problem that is an emergency. Do not wait to see if the symptoms will go away. Get medical help right away.  Call your local emergency services (911 in the [place] Do not drive yourself to the hospital.   This information is not intended to replace advice given to you by your health care provider. Make sure you discuss any questions you have with your health care provider.   Document Released: 12/12/2004 Document Revised: 03/25/2014 Document Reviewed: 10/08/2013 Elsevier Interactive Patient Education [employer].

## 2015-11-14 ENCOUNTER — Ambulatory Visit (INDEPENDENT_AMBULATORY_CARE_PROVIDER_SITE_OTHER): Admitting: Family Medicine

## 2015-11-14 ENCOUNTER — Ambulatory Visit
Admission: RE | Admit: 2015-11-14 | Discharge: 2015-11-14 | Disposition: A | Discharge status: Home / Self Care | Source: Ambulatory Visit | Attending: Family Medicine | Admitting: Family Medicine

## 2015-11-14 ENCOUNTER — Encounter: Payer: Self-pay | Admitting: Family Medicine

## 2015-11-14 ENCOUNTER — Other Ambulatory Visit: Payer: Self-pay | Admitting: Family Medicine

## 2015-11-14 VITALS — BP 120/82 | HR 80 | Temp 98.7°F | Resp 16 | Ht 63.0 in | Wt 249.2 lb

## 2015-11-14 DIAGNOSIS — M654 Radial styloid tenosynovitis [de Quervain]: Secondary | ICD-10-CM

## 2015-11-14 DIAGNOSIS — M159 Polyosteoarthritis, unspecified: Secondary | ICD-10-CM

## 2015-11-14 DIAGNOSIS — I10 Essential (primary) hypertension: Secondary | ICD-10-CM

## 2015-11-14 DIAGNOSIS — E785 Hyperlipidemia, unspecified: Secondary | ICD-10-CM

## 2015-11-14 DIAGNOSIS — Z794 Long term (current) use of insulin: Secondary | ICD-10-CM | POA: Diagnosis not present

## 2015-11-14 DIAGNOSIS — E1142 Type 2 diabetes mellitus with diabetic polyneuropathy: Secondary | ICD-10-CM | POA: Diagnosis not present

## 2015-11-14 DIAGNOSIS — K219 Gastro-esophageal reflux disease without esophagitis: Secondary | ICD-10-CM

## 2015-11-14 DIAGNOSIS — E1165 Type 2 diabetes mellitus with hyperglycemia: Secondary | ICD-10-CM

## 2015-11-14 DIAGNOSIS — M150 Primary generalized (osteo)arthritis: Secondary | ICD-10-CM

## 2015-11-14 DIAGNOSIS — M25532 Pain in left wrist: Secondary | ICD-10-CM | POA: Insufficient documentation

## 2015-11-14 DIAGNOSIS — IMO0002 Reserved for concepts with insufficient information to code with codable children: Secondary | ICD-10-CM

## 2015-11-14 LAB — CBC WITH DIFFERENTIAL/PLATELET
BASOS ABS: 0 {cells}/uL (ref 0–200)
Basophils Relative: 0 %
EOS ABS: 356 {cells}/uL (ref 15–500)
Eosinophils Relative: 4 %
HEMATOCRIT: 41.6 % (ref 35.0–45.0)
Hemoglobin: 13.5 g/dL (ref 11.7–15.5)
LYMPHS PCT: 27 %
Lymphs Abs: 2403 cells/uL (ref 850–3900)
MCH: 27.5 pg (ref 27.0–33.0)
MCHC: 32.5 g/dL (ref 32.0–36.0)
MCV: 84.7 fL (ref 80.0–100.0)
MONO ABS: 534 {cells}/uL (ref 200–950)
MONOS PCT: 6 %
MPV: 9.4 fL (ref 7.5–12.5)
NEUTROS PCT: 63 %
Neutro Abs: 5607 cells/uL (ref 1500–7800)
PLATELETS: 236 10*3/uL (ref 140–400)
RBC: 4.91 MIL/uL (ref 3.80–5.10)
RDW: 15.1 % — AB (ref 11.0–15.0)
WBC: 8.9 10*3/uL (ref 3.8–10.8)

## 2015-11-14 LAB — POCT UA - MICROALBUMIN: Microalbumin Ur, POC: 0 mg/L

## 2015-11-14 LAB — POCT GLYCOSYLATED HEMOGLOBIN (HGB A1C): Hemoglobin A1C: 9.2

## 2015-11-14 MED ORDER — ALPHA-LIPOIC ACID 600 MG PO CAPS
1.0000 | ORAL_CAPSULE | Freq: Every day | ORAL | 3 refills | Status: DC
Start: 2015-11-14 — End: 2016-03-29

## 2015-11-14 MED ORDER — QUINAPRIL HCL 20 MG PO TABS: 20.0000 mg | ORAL_TABLET | Freq: Every day | ORAL | 3 refills | Status: DC

## 2015-11-14 MED ORDER — HYDROXYZINE HCL 25 MG PO TABS: 25.0000 mg | ORAL_TABLET | Freq: Every day | ORAL | 1 refills | Status: DC | PRN

## 2015-11-14 MED ORDER — MEDICAL COMPRESSION STOCKINGS MISC: 1.0000 | Freq: Every day | 0 refills | Status: AC

## 2015-11-14 MED ORDER — FREESTYLE LANCETS MISC: 3 refills | Status: DC

## 2015-11-14 MED ORDER — GLIPIZIDE 5 MG PO TABS: 5.0000 mg | ORAL_TABLET | Freq: Two times a day (BID) | ORAL | 3 refills | Status: DC

## 2015-11-14 MED ORDER — TRAMADOL HCL 50 MG PO TABS: 50.0000 mg | ORAL_TABLET | Freq: Three times a day (TID) | ORAL | 0 refills | Status: DC | PRN

## 2015-11-14 MED ORDER — NAPROXEN 500 MG PO TABS
500.0000 mg | ORAL_TABLET | Freq: Two times a day (BID) | ORAL | 1 refills | Status: DC
Start: 2015-11-14 — End: 2016-03-29

## 2015-11-14 MED ORDER — OMEPRAZOLE 20 MG PO CPDR: 20.0000 mg | DELAYED_RELEASE_CAPSULE | Freq: Every day | ORAL | 2 refills | Status: DC

## 2015-11-14 MED ORDER — FREESTYLE SYSTEM KIT: 1.0000 | PACK | 0 refills | Status: DC | PRN

## 2015-11-14 MED ORDER — GLUCOSE BLOOD VI STRP: ORAL_STRIP | 5 refills | Status: DC

## 2015-11-14 MED ORDER — INSULIN PEN NEEDLE 31G X 5 MM MISC: 1.0000 | Freq: Every day | 3 refills | Status: DC

## 2015-11-14 MED ORDER — INSULIN GLARGINE 300 UNIT/ML ~~LOC~~ SOPN
16.0000 [IU] | PEN_INJECTOR | Freq: Every day | SUBCUTANEOUS | 3 refills | Status: DC
Start: 2015-11-14 — End: 2016-06-06

## 2015-11-14 NOTE — Assessment & Plan Note (Addendum)
Start basal insulin. Toujeo at 16 with goal to titrate 2 units per 7 days.  Restart glipizide at 1/2 dosing due to insulin. Add alpha lipoic acid for neuropathy.  Foot exam UTD. UA micro done. On ACE for renal protection. Encouraged pt to check sugars daily.  Recheck 4 weeks for insulin titration.

## 2015-11-14 NOTE — Assessment & Plan Note (Signed)
Renew omeprazole.

## 2015-11-14 NOTE — Progress Notes (Signed)
Subjective:    Patient ID: [name], female    DOB: [birth year], 65 y.o.   MRN: [id number]  HPI: MINNIE LEGROS is a 65 y.o. female presenting on 11/14/2015 for Diabetes (follow up )   HPI  Pt presents for diabetes follow-up. She has not been here in some time. Last A1c was 8.8%- she is up to 9.2% today. Currently taking Insulin Lispro 75/25 twice daily. She is also taking Glucotrol 72m once daily. Is not checking sugars- her machine "quit"- Needs a one step meter. She is not taking glipizide. Cannot tolerate metformin because it gave her migraines.   Eye exam:  She is on an ACE for renal protection. BP is elevated today. Denies CP, SOB, or visual changes.  She has neuropathy of the feet- the compression stockings seem to help. She stopped gabapentin because it did not help. She is requesting tramadol for pain.  She also presents for L thumb pain- present for many months- is getting worse.  She ahs had a similar issue in the past- [facility] No injuries or trauma to the wrist. No known history of osteoporosis. Felt a popping sensation in the thumb.   Past Medical History:  Diagnosis Date  . [place] pain ([place]   . Anxiety    not on any medication at this time  . Arthritis   . Asthma    "sports asthma or exercised induced"[facility]  . Bronchitis    hx of  . Chronic back pain   . Chronic neck pain   . Complication of anesthesia    "difficulty waking up from surgery"  . Depression    has OCD, states counts constantly  . Diabetes mellitus without complication ([place]   . GERD (gastroesophageal reflux disease)   . Headache(784.0)    hx of migraines  . Hyperlipidemia   . Neuromuscular disorder ([place]    past hx of carpal tunnel bilaterally  . Pneumonia    hx of  . PONV (postoperative nausea and vomiting)     Current Outpatient Prescriptions on File Prior to Visit  Medication  Sig  . simvastatin (ZOCOR) 40 MG tablet Take 1 tablet (40 mg total) by mouth at bedtime. Reported on 05/16/2015   No current facility-administered medications on file prior to visit.     Review of Systems  Constitutional: Negative for chills and fever.  HENT: Negative.   Respiratory: Negative for cough, chest tightness and wheezing.   Cardiovascular: Negative for chest pain and leg swelling.  Gastrointestinal: Negative for abdominal pain, constipation, diarrhea, nausea and vomiting.  Endocrine: Negative.  Negative for cold intolerance, heat intolerance, polydipsia, polyphagia and polyuria.  Genitourinary: Negative for difficulty urinating and dysuria.  Musculoskeletal: Positive for arthralgias.  Neurological: Positive for numbness (bilateral feet. ). Negative for dizziness and light-headedness.  Psychiatric/Behavioral: Negative.    Per HPI unless specifically indicated above     Objective:    BP 120/82 (BP Location: Left Arm, Patient Position: Sitting, Cuff Size: Large)   Pulse 80   Temp 98.7 F (37.1 C) (Oral)   Resp 16   Ht 5' 3"  (1.6 m)   Wt 249 lb 3.2 oz (113 kg)   BMI 44.14 kg/m   Wt Readings from Last 3 Encounters:  11/14/15 249 lb 3.2 oz (113 kg)  09/30/15 237 lb (107.5 kg)  06/13/15 240 lb (108.9 kg)    Physical Exam  Constitutional: She is oriented to person, place, and time. She appears well-developed and well-nourished.  HENT:  Head: Normocephalic and atraumatic.  Neck: Neck supple.  Cardiovascular: Normal rate, regular rhythm and normal heart sounds.  Exam reveals no gallop and no friction rub.   No murmur heard. Pulmonary/Chest: Effort normal and breath sounds normal. She has no wheezes. She exhibits no tenderness.  Abdominal: Soft. Normal appearance and bowel sounds are normal. She exhibits no distension and no mass. There is no tenderness. There is no rebound and no guarding.  Musculoskeletal: Normal range of motion. She exhibits no edema.       Left hand:  She exhibits tenderness. Normal sensation noted. Decreased strength noted. She exhibits thumb/finger opposition.  +Finkelsteins test L thumb.   Lymphadenopathy:    She has no cervical adenopathy.  Neurological: She is alert and oriented to person, place, and time. A sensory deficit (bilateral plantar surface) is present.  Skin: Skin is warm and dry.     Diabetic Foot Exam - Simple   Simple Foot Form Diabetic Foot exam was performed with the following findings:  Yes 11/14/2015 11:07 AM  Visual Inspection No deformities, no ulcerations, no other skin breakdown bilaterally:  Yes Sensation Testing See comments:  Yes Pulse Check See comments:  Yes Comments +1 DP/PT.  Loss of sensation to monofilament bilateral feet.      Results for orders placed or performed in visit on 11/14/15  POCT HgB A1C  Result Value Ref Range   Hemoglobin A1C 9.2   POCT UA - Microalbumin  Result Value Ref Range   Microalbumin Ur, POC 0 mg/L   Creatinine, POC  mg/dL   Albumin/Creatinine Ratio, Urine, POC        Assessment & Plan:   Problem List Items Addressed This Visit      Cardiovascular and Mediastinum   Hypertension    Controlled. Continue ACE for renal protection.       Relevant Medications   quinapril (ACCUPRIL) 20 MG tablet     Digestive   Acid reflux    Renew omeprazole.       Relevant Medications   omeprazole (PRILOSEC) 20 MG capsule   Other Relevant Orders   CBC with Differential     Endocrine   DM (diabetes mellitus), type 2, uncontrolled w/neurologic complication ([place]) - Primary    Start basal insulin. Toujeo at 16 with goal to titrate 2 units per 7 days.  Restart glipizide at 1/2 dosing due to insulin. Add alpha lipoic acid for neuropathy.  Foot exam UTD. UA micro done. On ACE for renal protection. Encouraged pt to check sugars daily.  Recheck 4 weeks for insulin titration.       Relevant Medications   Insulin Glargine (TOUJEO SOLOSTAR) 300 UNIT/ML SOPN   glipiZIDE  (GLUCOTROL) 5 MG tablet   glucose monitoring kit (FREESTYLE) monitoring kit   Elastic Bandages & Supports (MEDICAL COMPRESSION STOCKINGS) MISC   Alpha-Lipoic Acid 600 MG CAPS   Insulin Pen Needle 31G X 5 MM MISC   glucose blood (FREESTYLE TEST STRIPS) test strip   Lancets (FREESTYLE) lancets   quinapril (ACCUPRIL) 20 MG tablet   Other Relevant Orders   COMPLETE METABOLIC PANEL WITH GFR   POCT HgB A1C (Completed)   POCT UA - Microalbumin (Completed)     Musculoskeletal and Integument   De Quervain's tenosynovitis, left    Naproxen BID. Thumb spike immobilizer recommended OTC. Tramadol for severe pain. Ice the wrist. Consider ortho if not improving.  Relevant Medications   naproxen (NAPROSYN) 500 MG tablet   traMADol (ULTRAM) 50 MG tablet     Other   Dyslipidemia    Recheck lipid panel.       Relevant Orders   Lipid Profile    Other Visit Diagnoses    Primary osteoarthritis involving multiple joints       Relevant Medications   naproxen (NAPROSYN) 500 MG tablet   traMADol (ULTRAM) 50 MG tablet      Meds ordered this encounter  Medications  . Insulin Glargine (TOUJEO SOLOSTAR) 300 UNIT/ML SOPN    Sig: Inject 16 Units into the skin daily.    Dispense:  7.5 mL    Refill:  3    Order Specific Question:   Supervising Provider    Answer:   Arlis Porta [[id number]]  . glipiZIDE (GLUCOTROL) 5 MG tablet    Sig: Take 1 tablet (5 mg total) by mouth 2 (two) times daily before a meal.    Dispense:  90 tablet    Refill:  3    Order Specific Question:   Supervising Provider    Answer:   Arlis Porta [phone]  . glucose monitoring kit (FREESTYLE) monitoring kit    Sig: 1 each by Does not apply route as needed for other. CHeck blood glucose once daily before insulin injections. For ICD 10 E11.65    Dispense:  1 each    Refill:  0    Order Specific Question:   Supervising Provider    Answer:   Arlis Porta [phone]  . Elastic Bandages & Supports (MEDICAL  COMPRESSION STOCKINGS) MISC    Sig: 1 each by Does not apply route daily.    Dispense:  2 each    Refill:  0    Order Specific Question:   Supervising Provider    Answer:   Arlis Porta [phone]  . Alpha-Lipoic Acid 600 MG CAPS    Sig: Take 1 capsule (600 mg total) by mouth daily.    Dispense:  90 each    Refill:  3    Order Specific Question:   Supervising Provider    Answer:   Arlis Porta [phone]  . naproxen (NAPROSYN) 500 MG tablet    Sig: Take 1 tablet (500 mg total) by mouth 2 (two) times daily with a meal.    Dispense:  30 tablet    Refill:  1    Order Specific Question:   Supervising Provider    Answer:   Arlis Porta [phone]  . traMADol (ULTRAM) 50 MG tablet    Sig: Take 1 tablet (50 mg total) by mouth every 8 (eight) hours as needed.    Dispense:  30 tablet    Refill:  0    Order Specific Question:   Supervising Provider    Answer:   Arlis Porta [phone]  . Insulin Pen Needle 31G X 5 MM MISC    Sig: 1 each by Does not apply route daily.    Dispense:  100 each    Refill:  3    Order Specific Question:   Supervising Provider    Answer:   Arlis Porta [phone]  . glucose blood (FREESTYLE TEST STRIPS) test strip    Sig: Use as instructed    Dispense:  100 each    Refill:  5    Order Specific Question:   Supervising Provider  Answer:   Arlis Porta [[id number]]  . Lancets (FREESTYLE) lancets    Sig: Use as instructed    Dispense:  100 each    Refill:  3    Order Specific Question:   Supervising Provider    Answer:   Arlis Porta [phone]  . quinapril (ACCUPRIL) 20 MG tablet    Sig: Take 1 tablet (20 mg total) by mouth at bedtime.    Dispense:  90 tablet    Refill:  3    Order Specific Question:   Supervising Provider    Answer:   Arlis Porta [phone]  . omeprazole (PRILOSEC) 20 MG capsule    Sig: Take 1 capsule (20 mg total) by mouth daily.    Dispense:  90 capsule    Refill:  2    Order Specific  Question:   Supervising Provider    Answer:   Arlis Porta [[id number]]  . hydrOXYzine (ATARAX/VISTARIL) 25 MG tablet    Sig: Take 1 tablet (25 mg total) by mouth daily as needed for itching. For itching    Dispense:  90 tablet    Refill:  1    Order Specific Question:   Supervising Provider    Answer:   Arlis Porta [[id number]]      Follow up plan: Return in about 4 weeks (around 12/12/2015) for Diabetes.

## 2015-11-14 NOTE — Assessment & Plan Note (Signed)
Controlled. Continue ACE for renal protection.

## 2015-11-14 NOTE — Assessment & Plan Note (Signed)
Naproxen BID. Thumb spike immobilizer recommended OTC. Tramadol for severe pain. Ice the wrist. Consider ortho if not improving.

## 2015-11-14 NOTE — Assessment & Plan Note (Signed)
Recheck lipid panel 

## 2015-11-14 NOTE — Patient Instructions (Addendum)
Diabetes: Your A1c is very uncontrolled. I would like you to stop taking the Insulin Lispro and start taking Toujeo 16 units each day. Add 2 units once a week until the AM blood sugar is 130mg /dl.  Check your sugars once per day.  Start taking Glipizide once daily with a meal- 5mg  once daily.   De Quervain Tenosynovitis Tendons attach muscles to bones. They also help with joint movements. When tendons become irritated or swollen, it is called tendinitis. The extensor pollicis brevis (EPB) tendon connects the EPB muscle to a bone that is near the base of the thumb. The EPB muscle helps to straighten and extend the thumb. De Quervain tenosynovitis is a condition in which the EPB tendon lining (sheath) becomes irritated, thickened, and swollen. This condition is sometimes called stenosing tenosynovitis. This condition causes pain on the thumb side of the back of the wrist. CAUSES Causes of this condition include:  Activities that repeatedly cause your thumb and wrist to extend.  A sudden increase in activity or change in activity that affects your wrist. RISK FACTORS: This condition is more likely to develop in:  Females.  People who have diabetes.  Women who have recently given birth.  People who are over 39 years of age.  People who do activities that involve repeated hand and wrist motions, such as tennis, racquetball, volleyball, gardening, and taking care of children.  People who do heavy labor.  People who have poor wrist strength and flexibility.  People who do not warm up properly before activities. SYMPTOMS Symptoms of this condition include:  Pain or tenderness over the thumb side of the back of the wrist when your thumb and wrist are not moving.  Pain that gets worse when you straighten your thumb or extend your thumb or wrist.  Pain when the injured area is touched.  Locking or catching of the thumb joint while you bend and straighten your thumb.  Decreased thumb  motion due to pain.  Swelling over the affected area. DIAGNOSIS This condition is diagnosed with a medical history and physical exam. Your health care provider will ask for details about your injury and ask about your symptoms. TREATMENT Treatment may include the use of icing and medicines to reduce pain and swelling. You may also be advised to wear a splint or brace to limit your thumb and wrist motion. In less severe cases, treatment may also include working with a physical therapist to strengthen your wrist and calm the irritation around your EPB tendon sheath. In severe cases, surgery may be needed. HOME CARE INSTRUCTIONS If You Have a Splint or Brace:  Wear it as told by your health care provider. Remove it only as told by your health care provider.  Loosen the splint or brace if your fingers become numb and tingle, or if they turn cold and blue.  Keep the splint or brace clean and dry. Managing Pain, Stiffness, and Swelling   If directed, apply ice to the injured area.  Put ice in a plastic bag.  Place a towel between your skin and the bag.  Leave the ice on for 20 minutes, 2-3 times per day.  Move your fingers often to avoid stiffness and to lessen swelling.  Raise (elevate) the injured area above the level of your heart while you are sitting or lying down. General Instructions  Return to your normal activities as told by your health care provider. Ask your health care provider what activities are safe for you.  Take over-the-counter and prescription medicines only as told by your health care provider.  Keep all follow-up visits as told by your health care provider. This is important.  Do not drive or operate heavy machinery while taking prescription pain medicine. SEEK MEDICAL CARE IF:  Your pain, tenderness, or swelling gets worse, even if you have had treatment.  You have numbness or tingling in your wrist, hand, or fingers on the injured side.   This information  is not intended to replace advice given to you by your health care provider. Make sure you discuss any questions you have with your health care provider.   Document Released: 03/04/2005 Document Revised: 11/23/2014 Document Reviewed: 05/10/2014 Elsevier Interactive Patient Education [employer].

## 2015-11-15 LAB — LIPID PANEL
CHOL/HDL RATIO: 3.4 ratio (ref <=5.0)
CHOLESTEROL: 133 mg/dL (ref 125–200)
HDL: 39 mg/dL — AB (ref >=46)
LDL Cholesterol: 51 mg/dL (ref <130)
TRIGLYCERIDES: 217 mg/dL — AB (ref <150)
VLDL: 43 mg/dL — AB (ref <30)

## 2015-11-15 LAB — COMPLETE METABOLIC PANEL WITH GFR
ALK PHOS: 105 U/L (ref 33–130)
ALT: 19 U/L (ref 6–29)
AST: 33 U/L (ref 10–35)
Albumin: 3.6 g/dL (ref 3.6–5.1)
BUN: 11 mg/dL (ref 7–25)
CALCIUM: 9.4 mg/dL (ref 8.6–10.4)
CHLORIDE: 101 mmol/L (ref 98–110)
CO2: 28 mmol/L (ref 20–31)
Creat: 0.93 mg/dL (ref 0.50–0.99)
GFR, EST AFRICAN AMERICAN: 75 mL/min (ref >=60)
GFR, EST NON AFRICAN AMERICAN: 65 mL/min (ref >=60)
Glucose, Bld: 148 mg/dL — ABNORMAL HIGH (ref 65–99)
POTASSIUM: 4.2 mmol/L (ref 3.5–5.3)
Sodium: 141 mmol/L (ref 135–146)
Total Bilirubin: 0.4 mg/dL (ref 0.2–1.2)
Total Protein: 6.7 g/dL (ref 6.1–8.1)

## 2015-11-23 DIAGNOSIS — H401130 Primary open-angle glaucoma, bilateral, stage unspecified: Secondary | ICD-10-CM | POA: Diagnosis not present

## 2015-11-23 DIAGNOSIS — E113293 Type 2 diabetes mellitus with mild nonproliferative diabetic retinopathy without macular edema, bilateral: Secondary | ICD-10-CM | POA: Diagnosis not present

## 2015-11-24 LAB — HM DIABETES EYE EXAM

## 2015-11-28 ENCOUNTER — Other Ambulatory Visit: Payer: Self-pay | Admitting: Family Medicine

## 2015-11-28 ENCOUNTER — Telehealth: Payer: Self-pay | Admitting: Family Medicine

## 2015-11-28 DIAGNOSIS — Z794 Long term (current) use of insulin: Principal | ICD-10-CM

## 2015-11-28 DIAGNOSIS — IMO0002 Reserved for concepts with insufficient information to code with codable children: Secondary | ICD-10-CM

## 2015-11-28 DIAGNOSIS — E1142 Type 2 diabetes mellitus with diabetic polyneuropathy: Secondary | ICD-10-CM

## 2015-11-28 DIAGNOSIS — E1165 Type 2 diabetes mellitus with hyperglycemia: Principal | ICD-10-CM

## 2015-11-28 MED ORDER — INSULIN PEN NEEDLE 31G X 5 MM MISC: 1.0000 | Freq: Every day | 0 refills | Status: DC

## 2015-11-28 NOTE — Telephone Encounter (Signed)
Left message 3 additional refills submitted [facility] on 11/14/15.

## 2015-11-28 NOTE — Telephone Encounter (Signed)
Pt.have requested needle for [employer] called in to  [facility]. Pt also wanted to know what  To do about her  Insulin  Pt. Call bak # [phone]

## 2015-11-28 NOTE — Telephone Encounter (Signed)
Patient's medication was mailed out [employer] today.

## 2015-12-13 ENCOUNTER — Ambulatory Visit: Admitting: Family Medicine

## 2016-01-25 ENCOUNTER — Other Ambulatory Visit: Payer: Self-pay | Admitting: Family Medicine

## 2016-01-25 MED ORDER — HYDROXYZINE HCL 25 MG PO TABS: 25.0000 mg | ORAL_TABLET | Freq: Every day | ORAL | 1 refills | Status: DC | PRN

## 2016-01-25 NOTE — Telephone Encounter (Signed)
Pt needs a refill for hydroxyzine 25 mg sent to [facility].  Her call back number is [phone]

## 2016-03-21 ENCOUNTER — Telehealth: Payer: Self-pay | Admitting: *Deleted

## 2016-03-21 NOTE — Telephone Encounter (Signed)
In regards to forms from [place] for back, knee brace patient did not ask for this. Please disregard all request. Patient has schedule

## 2016-03-25 ENCOUNTER — Encounter: Payer: Self-pay | Admitting: Family Medicine

## 2016-03-25 ENCOUNTER — Ambulatory Visit (INDEPENDENT_AMBULATORY_CARE_PROVIDER_SITE_OTHER): Payer: Medicare Other | Admitting: Family Medicine

## 2016-03-25 VITALS — BP 150/72 | HR 79 | Temp 98.4°F | Resp 16 | Ht 63.0 in | Wt 231.0 lb

## 2016-03-25 DIAGNOSIS — G5603 Carpal tunnel syndrome, bilateral upper limbs: Secondary | ICD-10-CM | POA: Diagnosis not present

## 2016-03-25 DIAGNOSIS — Z794 Long term (current) use of insulin: Secondary | ICD-10-CM

## 2016-03-25 DIAGNOSIS — I10 Essential (primary) hypertension: Secondary | ICD-10-CM

## 2016-03-25 DIAGNOSIS — M654 Radial styloid tenosynovitis [de Quervain]: Secondary | ICD-10-CM | POA: Diagnosis not present

## 2016-03-25 DIAGNOSIS — E1165 Type 2 diabetes mellitus with hyperglycemia: Secondary | ICD-10-CM | POA: Diagnosis not present

## 2016-03-25 DIAGNOSIS — E1140 Type 2 diabetes mellitus with diabetic neuropathy, unspecified: Secondary | ICD-10-CM

## 2016-03-25 DIAGNOSIS — IMO0002 Reserved for concepts with insufficient information to code with codable children: Secondary | ICD-10-CM

## 2016-03-25 LAB — POCT GLYCOSYLATED HEMOGLOBIN (HGB A1C): Hemoglobin A1C: 12.8

## 2016-03-25 NOTE — Patient Instructions (Signed)
Thank you for coming in to clinic today.  1. Diabetes is uncontrolled. A1c is up from 9.8 to 12.8% - Start with 20 units Toujeo tomorrow, keep at this dose for now - Once you start checking blood sugars (let our office know the name of your meter and if you need test strips), 3 times a day, fasting in morning, 2 hours after lunch, 2 hours after dinner, write down your readings - If morning fasting blood sugar is >150 on average (at least 4 times in 1 week) then increase Toujeo by 1 unit every week until you reach 30 units - STart taking Glipizide 5mg  WITH MEAL, twice a day - Start eating breakfast everyday, cheerios with milk, no more toast only in breakfast  Check for Gabapentin at home - If 100mg  pills, then .Marland KitchenMarland KitchenStart Gabapentin 100mg  capsules, take at night for 2-3 nights only, and then increase to 2 times a day for a few days, and then may increase to 3 times a day, it may make you drowsy. - In the future if needed, we can significantly increase the dose if tolerated well, some common doses are 300mg  three times a day up to 600mg  three times a day, usually it takes several weeks or months to get to higher doses  Reminder - go ahead and contact your preferred Orthopedics office, [facility], [place], or other. Or locally could be [facility]. If need new referral, then let us know.  The 5 Minute Rule of Exercise - Promise yourself to at least do 5 minutes of exercise (make sure you time it), and if at the end of 5 minutes (this is the hardest part of the work-out), if you still feel like you want stop (or not motivated to continue) then allow yourself to stop. Otherwise, more often than not you will feel encouraged that you can continue for a little while longer or even more!  Diet Recommendations for Diabetes   Reduce Starchy (carb) foods include: Bread, rice, pasta, potatoes, corn, crackers, bagels, muffins, all baked goods.   Protein foods include: Meat, fish, poultry, eggs,  dairy foods, and beans such as pinto and kidney beans (beans also provide carbohydrate).   1. Eat at least 3 meals and 1-2 snacks per day. Never go more than 4-5 hours while awake without eating.   2. Limit starchy foods to TWO per meal and ONE per snack. ONE portion of a starchy  food is equal to the following:   - ONE slice of bread (or its equivalent, such as half of a hamburger bun).   - 1/2 cup of a "scoopable" starchy food such as potatoes or rice.   - 1 OUNCE (28 grams) of starchy snacks (crackers or pretzels, look on label).   - 15 grams of carbohydrate as shown on food label.   3. Both lunch and dinner should include a protein food, a carb food, and vegetables.   - Obtain twice as many veg's as protein or carbohydrate foods for both lunch and dinner.   - Try to keep frozen veg's on hand for a quick vegetable serving.     - Fresh or frozen veg's are best.   4. Breakfast should always include protein.    Please schedule a follow-up appointment with Dr. Parks Ranger in 6 weeks to 3 months - for Diabetes, A1c, Neuropathy  If you have any other questions or concerns, please feel free to call the clinic or send a message through [place]. You may also schedule  an earlier appointment if necessary.  Nobie Putnam, DO [place]

## 2016-03-25 NOTE — Progress Notes (Signed)
Subjective:    Patient ID: Alyssa Perkins, female    DOB: 1950/09/13, 66 y.o.   MRN: [id number]  Alyssa Perkins is a 66 y.o. female presenting on 03/25/2016 for Diabetes (pt has some arm and hand pain onset 6 month getting worst now)   HPI   CHRONIC DM, Type 2: Reports recently has had problems with controlling her blood sugar, states she recently moved and her glucometer may be misplaced, she needs to find this and her test supplies, unsure type of meter does not want re order, but thinks it is an Accu-Chek, will let us know if needs test strips. Also problems with recent stress with holidays, poor dietary choices. CBGs: Last checked >3 months ago. Has no readings today. Meds: Toujeo 16 units daily (takes mid morning) (she had increased up to 24 units, then recently reduced back to 16 after reading her rx box stating 16u over past 1 month). Taking Glipizide 5mg  bedtime without meal (not BID with meals). - Unable to take Metformin due to migraines. Previously tried Lantus insulin Currently on ACEi Lifestyle: Diet (poor choices recently with holidays, and admits stress eating, not limiting carb intake, does skip breakfast most days, sometimes has toast in morning, erratic eating schedule, worse with recent stress). No regular exercise but will walk occasionally to [place] some early burning and tingling in feet, see symptoms in hands below Denies hypoglycemia, polyuria, visual changes  CHRONIC HTN: Reports no concerns but states BP previously better controlled, with some pain recently with hands Current Meds - Quinapril 20mg  daily, did not take today. Tolerating well. Denies CP, dyspnea, HA, edema, dizziness / lightheadedness  History of Bilateral Carpal Tunnel S/p Surgery / Left hand Pain and Numbness  - Reports prior complex history of both hands with prior chronic recurrent pain and some neuropathic symptoms with numbness and tingling bilateral, Left worst than Right,  had been followed by various orthopedics over the past 10+ years, including [facility] (performed carpal tunnel surgery by report 6-7 years ago, has also seen [facility] for R knee and R hand, and [facility] for L knee and C-spine disc repair) - Now some worsening pain over few months, no recent injury or problem - States she takes Gabapentin at home for restless leg syndrome, but unsure dose, and this rx not available on her current list, did not bring meds - Admits stiffness and difficulty with pain and some weakness due to pain - Denies redness, swelling, injury   Social History  Substance Use Topics  . Smoking status: Former Smoker    Types: Cigarettes    Quit date: 07/17/1994  . Smokeless tobacco: Former Systems developer     Comment: stopped 18 years ago  . Alcohol use No    Review of Systems Per HPI unless specifically indicated above     Objective:    BP (!) 150/72 (BP Location: Left Arm, Patient Position: Sitting, Cuff Size: Normal)   Pulse 79   Temp 98.4 F (36.9 C) (Oral)   Resp 16   Ht 5\' 3"  (1.6 m)   Wt 231 lb (104.8 kg)   BMI 40.92 kg/m   Wt Readings from Last 3 Encounters:  03/25/16 231 lb (104.8 kg)  11/14/15 249 lb 3.2 oz (113 kg)  09/30/15 237 lb (107.5 kg)    Physical Exam  Constitutional: She appears well-developed and well-nourished. No distress.  Well-appearing, comfortable, cooperative, obese  HENT:  Head: Normocephalic and atraumatic.  Mouth/Throat: Oropharynx is  clear and moist.  Eyes: Conjunctivae are normal.  Neck: Normal range of motion. Neck supple. No thyromegaly present.  Cardiovascular: Normal rate, regular rhythm, normal heart sounds and intact distal pulses.   No murmur heard. Pulmonary/Chest: Effort normal and breath sounds normal. No respiratory distress. She has no wheezes. She has no rales.  Musculoskeletal: She exhibits no edema.  Bilateral Hand/Wrist Inspection: Normal appearance, symmetrical, no bulky MCP joints, no edema  or erythema. Palpation: Moderate tenderness to palpation L > R base of thumb CMC joint and over APL / EPB tendons radially. Non tender fingers, mild tender over palmar wrist and some discomfort over carpal bone. No distinct anatomical snuff box or scaphoid tenderness. ROM: Limited active wrist ROM flex / ext due to hand pain (LEFT), Right has some improved preserved ROM. Left hand pain with thumb opposition flexion, mostly preserved extension. Pain with radial deviation Special Testing: Finkelstein's test positive with moderate pain. Bilateral Tinel's median nerve pain with some radicular symptoms bilateral proximally up forearm Strength: Right hand 5/5 grip, Right hand 4/5 due to pain Neurovascular: distally intact to light touch, pulses intact  Lymphadenopathy:    She has no cervical adenopathy.  Neurological: She is alert.  Skin: Skin is warm and dry. No rash noted. She is not diaphoretic. No erythema.  Psychiatric: Her behavior is normal.  Nursing note and vitals reviewed.  Results for orders placed or performed in visit on 03/25/16  POCT HgB A1C  Result Value Ref Range   Hemoglobin A1C 12.8       Assessment & Plan:   Problem List Items Addressed This Visit    Hypertension    Elevated BP today, manual re-check still above goal. Did not take med today  Plan: 1. Check outside BP, keep log 2. Lifestyle modifications with diet, exercise 3. Follow-up re-check BP 3 months, likely may need additional agent      DM (diabetes mellitus), type 2, uncontrolled w/neurologic complication (HCC) - Primary    Significant worsening DM control in past 4 months, A1c 12.8 today (up from 9.8), non-adherent to lifestyle changes, now notable poor dietary choices and recent lifestyle, inc stress, inc portion size, not limiting carbs, no exercise. Also non adherent to insulin dosing, with recent confusion over past month on following Toujeo 16u on refill package instead of advised titrated dose up to 24  as she was previously on - Complication with peripheral neuropathy, feet and likely contributing to hand symptoms also with complication of carpal tunnel  Plan: 1. Increase Toujeo up to 20u daily starting tomorrow (up from 16), then titrate up by 1 unit every 1 week if fasting CBG avg >150 in AM, up to max dose 30, then notify office. 2. Start taking Glipizide 5mg  BID with meals (as prescribed, she was non adherent taking 5mg  bedtime without meal) 3. Start checking CBG, notify us if need new glucometer or test supplies, need name of meter, or can try [employer] brand otc, bring in CBG log TID checking, or limited ability to titrate her insulin safely as advised 4. Need to eat breakfast daily, regular 3 meal daily, advised low carb diet, changes, start regular walking exercise 5. Start gabapentin at home titrate up for neuropathy, notify if need new rx, likely will need up to 300mg  TID eventually 6. Follow-up 3 months DM A1c, sooner if abnormal CBGs or other concerns       Relevant Orders   POCT HgB A1C (Completed)   De Quervain's tenosynovitis, left  Possibly contributing additional etiology to L > R hand thumb pain, with known carpal tunnel, DM neuropathy - Limited options with diabetes uncontrolled, avoid steroids, already on treatment - Follow-up with ortho as planned      Bilateral carpal tunnel syndrome    Suspected primary etiology with bilateral L > R hand pain and tingling with some numbness and neuropathic symptoms, also radiating pain up forearms, exam is positive for carpal tunnel. Concern with some weakness due to this pain. - Complex history of prior carpal tunnel surgery bilateral >6 years ago, reportedly at [facility], she has seen about 3+ different ortho practices for multiple joints and other problems, including hips, knees, neck C-spine  Plan: 1. Given possible component with DM neuropathy, need to control blood sugar, see A&P 2. Resume Gabapentin at home, titrate up  dose as advised slowly, ideally may need up to 300mg  TID or more 3. Can continue splint for support with activity and at night 4. Advised limited options given the duration and severity of her suspected carpal tunnel, recommend that she follow-up with her prior Orthopedics of choice, will likely need imaging and nerve conduction study, she states does not need referral today, will notify us if she does ([facility]) 5. Follow-up as needed         No orders of the defined types were placed in this encounter.     Follow up plan: Return in about 3 months (around 06/23/2016) for diabetes A1c.  Nobie Putnam, [facility] 03/26/2016, 9:08 AM

## 2016-03-26 ENCOUNTER — Encounter: Payer: Self-pay | Admitting: Family Medicine

## 2016-03-26 DIAGNOSIS — G5603 Carpal tunnel syndrome, bilateral upper limbs: Secondary | ICD-10-CM | POA: Insufficient documentation

## 2016-03-26 NOTE — Assessment & Plan Note (Signed)
Significant worsening DM control in past 4 months, A1c 12.8 today (up from 9.8), non-adherent to lifestyle changes, now notable poor dietary choices and recent lifestyle, inc stress, inc portion size, not limiting carbs, no exercise. Also non adherent to insulin dosing, with recent confusion over past month on following Toujeo 16u on refill package instead of advised titrated dose up to 24 as she was previously on - Complication with peripheral neuropathy, feet and likely contributing to hand symptoms also with complication of carpal tunnel  Plan: 1. Increase Toujeo up to 20u daily starting tomorrow (up from 16), then titrate up by 1 unit every 1 week if fasting CBG avg >150 in AM, up to max dose 30, then notify office. 2. Start taking Glipizide 5mg  BID with meals (as prescribed, she was non adherent taking 5mg  bedtime without meal) 3. Start checking CBG, notify us if need new glucometer or test supplies, need name of meter, or can try [employer] brand otc, bring in CBG log TID checking, or limited ability to titrate her insulin safely as advised 4. Need to eat breakfast daily, regular 3 meal daily, advised low carb diet, changes, start regular walking exercise 5. Start gabapentin at home titrate up for neuropathy, notify if need new rx, likely will need up to 300mg  TID eventually 6. Follow-up 3 months DM A1c, sooner if abnormal CBGs or other concerns

## 2016-03-26 NOTE — Assessment & Plan Note (Signed)
Suspected primary etiology with bilateral L > R hand pain and tingling with some numbness and neuropathic symptoms, also radiating pain up forearms, exam is positive for carpal tunnel. Concern with some weakness due to this pain. - Complex history of prior carpal tunnel surgery bilateral >6 years ago, reportedly at [facility], she has seen about 3+ different ortho practices for multiple joints and other problems, including hips, knees, neck C-spine  Plan: 1. Given possible component with DM neuropathy, need to control blood sugar, see A&P 2. Resume Gabapentin at home, titrate up dose as advised slowly, ideally may need up to 300mg  TID or more 3. Can continue splint for support with activity and at night 4. Advised limited options given the duration and severity of her suspected carpal tunnel, recommend that she follow-up with her prior Orthopedics of choice, will likely need imaging and nerve conduction study, she states does not need referral today, will notify us if she does ([employer] [facility]) 5. Follow-up as needed

## 2016-03-26 NOTE — Assessment & Plan Note (Signed)
Elevated BP today, manual re-check still above goal. Did not take med today  Plan: 1. Check outside BP, keep log 2. Lifestyle modifications with diet, exercise 3. Follow-up re-check BP 3 months, likely may need additional agent

## 2016-03-26 NOTE — Assessment & Plan Note (Signed)
Possibly contributing additional etiology to L > R hand thumb pain, with known carpal tunnel, DM neuropathy - Limited options with diabetes uncontrolled, avoid steroids, already on treatment - Follow-up with ortho as planned

## 2016-03-29 ENCOUNTER — Telehealth: Payer: Self-pay | Admitting: Family Medicine

## 2016-03-29 DIAGNOSIS — M654 Radial styloid tenosynovitis [de Quervain]: Secondary | ICD-10-CM

## 2016-03-29 DIAGNOSIS — IMO0002 Reserved for concepts with insufficient information to code with codable children: Secondary | ICD-10-CM

## 2016-03-29 DIAGNOSIS — E1165 Type 2 diabetes mellitus with hyperglycemia: Principal | ICD-10-CM

## 2016-03-29 DIAGNOSIS — E1142 Type 2 diabetes mellitus with diabetic polyneuropathy: Secondary | ICD-10-CM

## 2016-03-29 DIAGNOSIS — Z794 Long term (current) use of insulin: Principal | ICD-10-CM

## 2016-03-29 MED ORDER — NAPROXEN 500 MG PO TABS: 500.0000 mg | ORAL_TABLET | Freq: Two times a day (BID) | ORAL | 2 refills | Status: DC

## 2016-03-29 MED ORDER — SIMVASTATIN 40 MG PO TABS: 40.0000 mg | ORAL_TABLET | Freq: Every day | ORAL | 3 refills | Status: DC

## 2016-03-29 MED ORDER — GABAPENTIN 100 MG PO CAPS: ORAL_CAPSULE | ORAL | 1 refills | Status: DC

## 2016-03-29 MED ORDER — FREESTYLE LANCETS MISC: 3 refills | Status: DC

## 2016-03-29 MED ORDER — GLUCOSE BLOOD VI STRP: ORAL_STRIP | 3 refills | Status: DC

## 2016-03-29 MED ORDER — ALPHA-LIPOIC ACID 600 MG PO CAPS: 1.0000 | ORAL_CAPSULE | Freq: Every day | ORAL | 3 refills | Status: DC

## 2016-03-29 NOTE — Telephone Encounter (Signed)
Patient told me during last visit that she has Gabapentin at home, I recommended that she resume this for wrist pain and increase dose start 100mg  capsule nightly then every few days as tolerated increase slowly up to 100mg  capsules 3 times daily, and even can go higher doses in future up to max 300mg  TID. Also ordered Naproxen 500mg  twice daily with food for anti-inflammatory for wrist. Sent to [place].  Alyssa Perkins, [facility] 03/29/2016, 5:06 PM

## 2016-03-29 NOTE — Telephone Encounter (Signed)
R/T call to patient: She called to confirm medications. Simvastatin, Alpha Lipoic acid and strip/lancets for meter were renewed.  She is requesting something for wrist pain as she will not be able to get in with ortho any time soon.

## 2016-03-29 NOTE — Telephone Encounter (Signed)
Pt. Called requesting  Nurse call her back  [phone]

## 2016-06-06 ENCOUNTER — Telehealth: Payer: Self-pay | Admitting: Family Medicine

## 2016-06-06 ENCOUNTER — Encounter: Payer: Self-pay | Admitting: Nurse Practitioner

## 2016-06-06 ENCOUNTER — Other Ambulatory Visit: Payer: Self-pay | Admitting: Family Medicine

## 2016-06-06 ENCOUNTER — Ambulatory Visit (INDEPENDENT_AMBULATORY_CARE_PROVIDER_SITE_OTHER): Payer: Medicare Other | Admitting: Nurse Practitioner

## 2016-06-06 VITALS — BP 115/71 | HR 92 | Temp 98.3°F | Resp 16 | Ht 63.0 in | Wt 218.0 lb

## 2016-06-06 DIAGNOSIS — K580 Irritable bowel syndrome with diarrhea: Secondary | ICD-10-CM

## 2016-06-06 DIAGNOSIS — E1140 Type 2 diabetes mellitus with diabetic neuropathy, unspecified: Secondary | ICD-10-CM | POA: Diagnosis not present

## 2016-06-06 DIAGNOSIS — Z794 Long term (current) use of insulin: Secondary | ICD-10-CM

## 2016-06-06 DIAGNOSIS — E1165 Type 2 diabetes mellitus with hyperglycemia: Secondary | ICD-10-CM | POA: Diagnosis not present

## 2016-06-06 DIAGNOSIS — G5603 Carpal tunnel syndrome, bilateral upper limbs: Secondary | ICD-10-CM

## 2016-06-06 DIAGNOSIS — R51 Headache: Secondary | ICD-10-CM

## 2016-06-06 DIAGNOSIS — F3289 Other specified depressive episodes: Secondary | ICD-10-CM | POA: Diagnosis not present

## 2016-06-06 DIAGNOSIS — M5412 Radiculopathy, cervical region: Secondary | ICD-10-CM

## 2016-06-06 DIAGNOSIS — Z8669 Personal history of other diseases of the nervous system and sense organs: Secondary | ICD-10-CM | POA: Diagnosis not present

## 2016-06-06 DIAGNOSIS — E1142 Type 2 diabetes mellitus with diabetic polyneuropathy: Secondary | ICD-10-CM

## 2016-06-06 DIAGNOSIS — R519 Headache, unspecified: Secondary | ICD-10-CM

## 2016-06-06 DIAGNOSIS — IMO0002 Reserved for concepts with insufficient information to code with codable children: Secondary | ICD-10-CM

## 2016-06-06 DIAGNOSIS — M654 Radial styloid tenosynovitis [de Quervain]: Secondary | ICD-10-CM

## 2016-06-06 LAB — GLUCOSE, POCT (MANUAL RESULT ENTRY): POC Glucose: 388 mg/dl — AB (ref 70–99)

## 2016-06-06 MED ORDER — PSYLLIUM 28 % PO PACK: PACK | ORAL | 3 refills | Status: DC

## 2016-06-06 MED ORDER — PROBIOTIC PO CAPS: 1.0000 | ORAL_CAPSULE | Freq: Every day | ORAL | 3 refills | Status: DC

## 2016-06-06 MED ORDER — SERTRALINE HCL 50 MG PO TABS: 50.0000 mg | ORAL_TABLET | Freq: Every day | ORAL | 3 refills | Status: DC

## 2016-06-06 MED ORDER — INSULIN GLARGINE 300 UNIT/ML ~~LOC~~ SOPN: 16.0000 [IU] | PEN_INJECTOR | Freq: Every day | SUBCUTANEOUS | 3 refills | Status: DC

## 2016-06-06 MED ORDER — GABAPENTIN 100 MG PO CAPS: ORAL_CAPSULE | ORAL | 3 refills | Status: DC

## 2016-06-06 NOTE — Progress Notes (Signed)
Subjective:    Patient ID: Alyssa Perkins, female    DOB: 03/18/1951, 66 y.o.   MRN: [id number]  XIAO GRAUL is a 66 y.o. female presenting on 06/06/2016 for Diarrhea (as per pt ongoing from year can't control bowel movement it's hard for her to go out in [place] or grocery store ) and Emesis (day before yesterday gets nausaous and HA sharp pain in middle which rediate to neck and back)   HPI Diarrhea/GI upset Alyssa Perkins reports having "diarrhea so bad it is hard to go to a restaurant or grocery store".  Rarely leaves the house, but has trouble when going shopping.  Alyssa Perkins has the same symptoms at home, but always has access to a bathroom so Alyssa Perkins isn't as worried about her symptoms since Alyssa Perkins has access to a bathroom.  Diarrhea is characterized by urgency, explosiveness.  Her bowel pattern includes a soft BM at times, but Alyssa Perkins almost always has diarrhea occurring 2-3 times per day diarrhea all week.  Diarrhea is classified as having pieces of soft stool most of the time and is sometimes watery. Alyssa Perkins admits to feeling completely empty after going.    There is a pattern to symptoms depending on what Alyssa Perkins eats.  When Alyssa Perkins goes out to eat Alyssa Perkins has trouble because Alyssa Perkins eats things like potatoes w/ gravy/butter/eggs/bacon.  Her stomach gurgles and Alyssa Perkins gets bad cramps after eating.  The only food Alyssa Perkins says does not cause cramping and diarrhea after eating is cereal with milk.  Alyssa Perkins has used anti diarrhea: 3-4 times per month  Uses it when Alyssa Perkins leaves home, but it doesn't seem to help.  Alyssa Perkins is not taking anything else for her IBS symptoms. Alyssa Perkins states a "doctor told me once I have IBS, but I haven't ever taken anything for it."  Alyssa Perkins has a poor appetite that started 1 mo ago.  Yesterday breakfast was the last time Alyssa Perkins ate.  Everything is making her nauseated.  When Alyssa Perkins does eat Alyssa Perkins can eat potatoes with gravy and a roll with butter and feels really full.  Alyssa Perkins also describes epigastric pressure.  Alyssa Perkins thinks  that Alyssa Perkins has had a hiatal hernia and thinks it was surgically fixed, but her memory about this is poor.  Depression Alyssa Perkins admits to having difficulty sleeping and takes hydroxizine (Alyssa Perkins admits is for itching, but makes her sleepy so Alyssa Perkins takes it for sleep), 2 OTC "sleeping pills" to sleep every night.  Sometimes it doesn't work.    Wants to "cry/smash stuff."  Feels like a burden.  Son notices Alyssa Perkins lives "so depressed with closed curtains."  "I could sit in a dark room all day and not care about nothing."    Headaches First one was 3 weeks ago.  Pressure squeezing on head  To top of head burning needles on head then down neck (known disk dz in neck).  L eye swells with h/a.  Pain below eyes  (needle-like pain in eye) sometimes happens with headache.   Time is only treatment.  h/a lasts 30 min - 3 hours.  Migraine history - not the same headache.  2 x per day.  At least 1x everyday.  Worrying makes it worse.    Takes ibuprofen for other aches - does not help headache.  Happens at any time of day, and wakes up at night. Lightheaded.    Dizzy/balance issues.  Fall 2 Sundays ago.  And had difficulty getting up.  Left side hurt -  Still at the L shoulder.  Doesn't remember the fall.  Sister says Alyssa Perkins tripped.  Alyssa Perkins didn't check her CBG to see if it was low when Alyssa Perkins fell.      Social History  Substance Use Topics  . Smoking status: Former Smoker    Types: Cigarettes    Quit date: 07/17/1994  . Smokeless tobacco: Former Systems developer     Comment: stopped 18 years ago  . Alcohol use No    Review of Systems   Per HPI unless specifically indicated above     Objective:    BP 115/71   Pulse 92   Temp 98.3 F (36.8 C) (Oral)   Resp 16   Ht 5\' 3"  (1.6 m)   Wt 218 lb (98.9 kg)   BMI 38.62 kg/m   Wt Readings from Last 3 Encounters:  06/06/16 218 lb (98.9 kg)  03/25/16 231 lb (104.8 kg)  11/14/15 249 lb 3.2 oz (113 kg)   Depression screen [facility] 2/9 06/07/2016 06/06/2016 06/06/2016 05/16/2015    Decreased Interest 3 3 0 0  Down, Depressed, Hopeless 3 3 0 0  PHQ - 2 Score 6 6 0 0  Altered sleeping 3 3 - -  Tired, decreased energy 3 3 - -  Change in appetite 3 3 - -  Feeling bad or failure about yourself  3 3 - -  Trouble concentrating 3 3 - -  Moving slowly or fidgety/restless 0 0 - -  Suicidal thoughts 3 1 - -  PHQ-9 Score 24 22 - -  Difficult doing work/chores - Somewhat difficult - -      Physical Exam  Constitutional: Alyssa Perkins is oriented to person, place, and time. Alyssa Perkins appears well-developed and well-nourished. No distress.  HENT:  Head: Normocephalic and atraumatic.  Mouth/Throat: Oropharynx is clear and moist.  Eyes: Conjunctivae and EOM are normal. Pupils are equal, round, and reactive to light.  Neck: Normal range of motion. Neck supple.  Cardiovascular: Normal rate, regular rhythm and normal heart sounds.   Pulmonary/Chest: Effort normal and breath sounds normal. No respiratory distress. Alyssa Perkins has no wheezes.  Abdominal: Soft. Normal appearance and bowel sounds are normal. There is no hepatosplenomegaly. There is tenderness in the right upper quadrant, epigastric area and left upper quadrant. There is guarding. There is no rebound and no CVA tenderness.  Musculoskeletal:       Right knee: Alyssa Perkins exhibits decreased range of motion and swelling.       Left knee: Alyssa Perkins exhibits decreased range of motion and swelling.  Gait ataxic.  Alyssa Perkins has difficulty standing from seated and requires assistance getting to the exam table.      Lymphadenopathy:    Alyssa Perkins has no cervical adenopathy.  Neurological: Alyssa Perkins is alert and oriented to person, place, and time. Alyssa Perkins has normal strength and normal reflexes. No cranial nerve deficit or sensory deficit. Alyssa Perkins displays a negative Romberg sign.  Poor balance with heel-to toe walking. Needs help to maintain balance.  Skin: Skin is warm and dry.  Psychiatric: Her speech is normal and behavior is normal. Judgment and thought content normal. Her affect  is labile. Cognition and memory are normal. Alyssa Perkins exhibits a depressed mood. Alyssa Perkins expresses no suicidal ideation. Alyssa Perkins expresses no suicidal plans.  Seems to seek attention   Results for orders placed or performed in visit on 06/06/16  POCT Glucose (CBG)  Result Value Ref Range   POC Glucose 388 (A) 70 - 99 mg/dl      Assessment &  Plan:   Problem List Items Addressed This Visit    DM (diabetes mellitus), type 2, uncontrolled w/neurologic complication ([place])   Relevant Orders   1. POCT Glucose (CBG) (Completed) 2. Follow-up with Dr. Raliegh Ip as scheduled.     Other Visit Diagnoses    Irritable bowel syndrome with diarrhea    -  Primary   Relevant Medications   1. START Probiotic CAPS one capsule daily.   2. START psyllium (METAMUCIL SMOOTH TEXTURE) 28 % packet 1/4 packet twice daily for 1 week.  Then take 1/2 packet twice daily. 3. Encourage fluids. 4. Side effects reviewed and cautioned to stop taking psyllium if experienced additional gas and bloating.   Other depression       Relevant Medications   1. sertraline (ZOLOFT) 50 MG tablet take one tablet daily. 2. Follow-up with Dr. Raliegh Ip as scheduled.   Frequent headaches       1. Take ibuprofen 600 mg 2 times per day as needed.  Can alternate with acetaminophen 650 two tablets twice per day as needed for up to two weeks. 2. Check CBG daily for evaluation of contributing factors.         Meds ordered this encounter  Medications  . sertraline (ZOLOFT) 50 MG tablet    Sig: Take 1 tablet (50 mg total) by mouth daily.    Dispense:  30 tablet    Refill:  3    Order Specific Question:   Supervising Provider    Answer:   [name] [[id number]]  . Probiotic CAPS    Sig: Take 1 capsule by mouth daily.    Dispense:  90 capsule    Refill:  3    Order Specific Question:   Supervising Provider    Answer:   [name] [[id number]]  . psyllium (METAMUCIL SMOOTH TEXTURE) 28 % packet    Sig: Take 1/4 packet by mouth 2 (two) times per  day for 2 weeks.  Take 1/2 packet 2(two) times per day after.    Dispense:  30 packet    Refill:  3    Order Specific Question:   Supervising Provider    Answer:   [name] [[id number]]      Follow up plan: Return in about 2 weeks (around 06/20/2016), or if symptoms worsen or fail to improve.     Cassell Smiles, DNP, AGNP-BC Adult Gerontology Nurse Practitioner [facility] 06/06/2016, 5:26 PM

## 2016-06-06 NOTE — Telephone Encounter (Signed)
error 

## 2016-06-06 NOTE — Patient Instructions (Signed)
Alyssa Perkins, Thank you for coming in to clinic today.  1. For your diarrhea.  Start taking probiotics 1 capsule once daily.  Start taking psyllium 1/4 packet two times per day.  Mix in water and drink plenty of water when you take psyllium.  If you have extra bloating, gas, distention it is ok to stop taking it and let us know you couldn't take it.  2. For your headaches.  Your neurological exam is normal.  You can taking ibuprofen 600 mg 2 times per day for your headache.  Acetaminophen extra strength 2 tablets 2 times per day. Alternate ibuprofen and acetaminophen.  Do not take both medicines for more than two weeks.   3. For your depression: start taking sertraline 50 mg once per day.  It may take 4-6 weeks for your mood to get better.  Continue activities you enjoy.  If you have suicidal thoughts, please get seen here as soon as possible in the clinic, urgent care, or emergency room.  Please schedule a follow-up appointment with Cassell Smiles, AGNP or Dr. Raliegh Ip in 2 weeks as needed if headaches do not get better or get worse.  If you have any other questions or concerns, please feel free to call the clinic or send a message through [place]. You may also schedule an earlier appointment if necessary.  Cassell Smiles, DNP, AGNP-BC Adult Gerontology Nurse Practitioner [facility], [facility] Probiotics What are probiotics? Probiotics are the good bacteria and yeasts that live in your body and keep you and your digestive system healthy. Probiotics also help your body's defense (immune) system and protect your body against bad bacterial growth. Certain foods contain probiotics, such as yogurt. Probiotics can also be purchased as a supplement. As with any supplement or drug, it is important to discuss its use with your health care provider. What affects the balance of bacteria in my body? The balance of bacteria in your body can be affected by:  Antibiotic medicines. Antibiotics are  sometimes necessary to treat infection. Unfortunately, they may kill good or friendly bacteria in your body as well as the bad bacteria. This may lead to stomach problems like diarrhea, gas, and cramping.  Disease. Some conditions are the result of an overgrowth of bad bacteria, yeasts, parasites, or fungi. These conditions include:  Infectious diarrhea.  Stomach and respiratory infections.  Skin infections.  Irritable bowel syndrome (IBS).  Inflammatory bowel diseases.  Ulcer due to Helicobacter pylori (H. pylori) infection.  Tooth decay and periodontal disease.  Vaginal infections. Stress and poor diet may also lower the good bacteria in your body. What type of probiotic is right for me? Probiotics are available over the counter at your local pharmacy, health food, or grocery store. They come in many different forms, combinations of strains, and dosing strengths. Some may need to be refrigerated. Always read the label for storage and usage instructions. Specific strains have been shown to be more effective for certain conditions. Ask your health care provider what option is best for you. Why would I need probiotics? There are many reasons your health care provider might recommend a probiotic supplement, including:  Diarrhea.  Constipation.  IBS.  Respiratory infections.  Yeast infections.  Acne, eczema, and other skin conditions.  Frequent urinary tract infections (UTIs). Are there side effects of probiotics? Some people experience mild side effects when taking probiotics. Side effects are usually temporary and may include:  Gas.  Bloating.  Cramping. Rarely, serious side effects, such as infection  or immune system changes, may occur. What else do I need to know about probiotics?  There are many different strains of probiotics. Certain strains may be more effective depending on your condition. Probiotics are available in varying doses. Ask your health care provider  which probiotic you should use and how often.  If you are taking probiotics along with antibiotics, it is generally recommended to wait at least 2 hours between taking the antibiotic and taking the probiotic. For more information: [facility] [email or web address] This information is not intended to replace advice given to you by your health care provider. Make sure you discuss any questions you have with your health care provider. Document Released: 09/29/2013 Document Revised: 01/30/2016 Document Reviewed: 06/01/2013 Elsevier Interactive Patient Education  2017 [place] = Psyllium oral capsule What is this medicine? PSYLLIUM (SIL i yum) is a bulk-forming fiber laxative. This medicine is used to treat constipation. Increasing fiber in the diet may also help lower cholesterol and promote heart health for some people. This medicine may be used for other purposes; ask your health care provider or pharmacist if you have questions. COMMON BRAND NAME(S): GenFiber, Konsyl, Metamucil, Metamucil MultiHealth, Natural Fiber Laxative, Reguloid What should I tell my health care provider before I take this medicine? They need to know if you have any of these conditions: -change in bowel habits for more than 14 days -blocked intestines or bowel -stomach pain, nausea, or vomiting -trouble swallowing -an unusual or allergic reaction to psyllium, tartrazine dye, other medicines, dyes, or preservatives -pregnant or trying or get pregnant -breast-feeding How should I use this medicine? Take this medicine by mouth with a full glass of water. Follow the directions on the package labeling, or take as directed by your health care professional. Take your medicine at regular intervals. Do not take your medicine more often than directed. Talk to your pediatrician regarding the use of this medicine in children. While this drug may be prescribed for  children as young as 27 years old for selected conditions, precautions do apply. Overdosage: If you think you have taken too much of this medicine contact a poison control center or emergency room at once. NOTE: This medicine is only for you. Do not share this medicine with others. What if I miss a dose? If you miss a dose, take it as soon as you can. If it is almost time for your next dose, take only that dose. Do not take double or extra doses. What may interact with this medicine? Interactions are not expected. This list may not describe all possible interactions. Give your health care provider a list of all the medicines, herbs, non-prescription drugs, or dietary supplements you use. Also tell them if you smoke, drink alcohol, or use illegal drugs. Some items may interact with your medicine. What should I watch for while using this medicine? This medicine can take up to 3 days to work. Check with your doctor or health care professional if your symptoms do not start to get better or if they get worse. See your doctor if you have to treat your constipation for more than 1 week. Avoid taking other medicines within 2 hours of taking this medicine. Drink several glasses of water a day while you are taking this medicine. This will help to relieve constipation and prevent dehydration. What side effects may I notice from receiving this medicine? Side effects that you should report to your doctor or health care professional  as soon as possible: -allergic reactions like skin rash, itching or hives, swelling of the face, lips, or tongue -breathing problems -chest pain -nausea, vomiting -rectal bleeding -trouble swallowing Side effects that usually do not require medical attention (report to your doctor or health care professional if they continue or are bothersome): -bloated or 'gassy' feeling -diarrhea -headache -stomach cramps This list may not describe all possible side effects. Call your doctor  for medical advice about side effects. You may report side effects to FDA at 1-800-FDA-1088. Where should I keep my medicine? Keep out of the reach of children. Store at room temperature between 15 and 30 degrees C (59 and 86 degrees F). Protect from moisture. Throw away any unused medicine after the expiration date. NOTE: This sheet is a summary. It may not cover all possible information. If you have questions about this medicine, talk to your doctor, pharmacist, or health care provider.  2018 Elsevier/Gold Standard (2015-01-11 08:31:27)   Sertraline tablets What is this medicine? SERTRALINE (SER tra leen) is used to treat depression. It may also be used to treat obsessive compulsive disorder, panic disorder, post-trauma stress, premenstrual dysphoric disorder (PMDD) or social anxiety. This medicine may be used for other purposes; ask your health care provider or pharmacist if you have questions. COMMON BRAND NAME(S): Zoloft What should I tell my health care provider before I take this medicine? They need to know if you have any of these conditions: -bleeding disorders -bipolar disorder or a family history of bipolar disorder -glaucoma -heart disease -high blood pressure -history of irregular heartbeat -history of low levels of calcium, magnesium, or potassium in the blood -if you often drink alcohol -liver disease -receiving electroconvulsive therapy -seizures -suicidal thoughts, plans, or attempt; a previous suicide attempt by you or a family member -take medicines that treat or prevent blood clots -thyroid disease -an unusual or allergic reaction to sertraline, other medicines, foods, dyes, or preservatives -pregnant or trying to get pregnant -breast-feeding How should I use this medicine? Take this medicine by mouth with a glass of water. Follow the directions on the prescription label. You can take it with or without food. Take your medicine at regular intervals. Do not take  your medicine more often than directed. Do not stop taking this medicine suddenly except upon the advice of your doctor. Stopping this medicine too quickly may cause serious side effects or your condition may worsen. A special MedGuide will be given to you by the pharmacist with each prescription and refill. Be sure to read this information carefully each time. Talk to your pediatrician regarding the use of this medicine in children. While this drug may be prescribed for children as young as 7 years for selected conditions, precautions do apply. Overdosage: If you think you have taken too much of this medicine contact a poison control center or emergency room at once. NOTE: This medicine is only for you. Do not share this medicine with others. What if I miss a dose? If you miss a dose, take it as soon as you can. If it is almost time for your next dose, take only that dose. Do not take double or extra doses. What may interact with this medicine? Do not take this medicine with any of the following medications: -cisapride -dofetilide -dronedarone -linezolid -MAOIs like Carbex, Eldepryl, Marplan, Nardil, and Parnate -methylene blue (injected into a vein) -pimozide -thioridazine This medicine may also interact with the following medications: -alcohol -amphetamines -aspirin and aspirin-like medicines -certain medicines for depression, anxiety,  or psychotic disturbances -certain medicines for fungal infections like ketoconazole, fluconazole, posaconazole, and itraconazole -certain medicines for irregular heart beat like flecainide, quinidine, propafenone -certain medicines for migraine headaches like almotriptan, eletriptan, frovatriptan, naratriptan, rizatriptan, sumatriptan, zolmitriptan -certain medicines for sleep -certain medicines for seizures like carbamazepine, valproic acid, phenytoin -certain medicines that treat or prevent blood clots like warfarin, enoxaparin,  dalteparin -cimetidine -digoxin -diuretics -fentanyl -isoniazid -lithium -NSAIDs, medicines for pain and inflammation, like ibuprofen or naproxen -other medicines that prolong the QT interval (cause an abnormal heart rhythm) -rasagiline -safinamide -supplements like St. John's wort, kava kava, valerian -tolbutamide -tramadol -tryptophan This list may not describe all possible interactions. Give your health care provider a list of all the medicines, herbs, non-prescription drugs, or dietary supplements you use. Also tell them if you smoke, drink alcohol, or use illegal drugs. Some items may interact with your medicine. What should I watch for while using this medicine? Tell your doctor if your symptoms do not get better or if they get worse. Visit your doctor or health care professional for regular checks on your progress. Because it may take several weeks to see the full effects of this medicine, it is important to continue your treatment as prescribed by your doctor. Patients and their families should watch out for new or worsening thoughts of suicide or depression. Also watch out for sudden changes in feelings such as feeling anxious, agitated, panicky, irritable, hostile, aggressive, impulsive, severely restless, overly excited and hyperactive, or not being able to sleep. If this happens, especially at the beginning of treatment or after a change in dose, call your health care professional. Dennis Bast may get drowsy or dizzy. Do not drive, use machinery, or do anything that needs mental alertness until you know how this medicine affects you. Do not stand or sit up quickly, especially if you are an older patient. This reduces the risk of dizzy or fainting spells. Alcohol may interfere with the effect of this medicine. Avoid alcoholic drinks. Your mouth may get dry. Chewing sugarless gum or sucking hard candy, and drinking plenty of water may help. Contact your doctor if the problem does not go away or  is severe. What side effects may I notice from receiving this medicine? Side effects that you should report to your doctor or health care professional as soon as possible: -allergic reactions like skin rash, itching or hives, swelling of the face, lips, or tongue -anxious -black, tarry stools -changes in vision -confusion -elevated mood, decreased need for sleep, racing thoughts, impulsive behavior -eye pain -fast, irregular heartbeat -feeling faint or lightheaded, falls -feeling agitated, angry, or irritable -hallucination, loss of contact with reality -loss of balance or coordination -loss of memory -painful or prolonged erections -restlessness, pacing, inability to keep still -seizures -stiff muscles -suicidal thoughts or other mood changes -trouble sleeping -unusual bleeding or bruising -unusually weak or tired -vomiting Side effects that usually do not require medical attention (report to your doctor or health care professional if they continue or are bothersome): -change in appetite or weight -change in sex drive or performance -diarrhea -increased sweating -indigestion, nausea -tremors This list may not describe all possible side effects. Call your doctor for medical advice about side effects. You may report side effects to FDA at 1-800-FDA-1088. Where should I keep my medicine? Keep out of the reach of children. Store at room temperature between 15 and 30 degrees C (59 and 86 degrees F). Throw away any unused medicine after the expiration date. NOTE: This sheet is  a summary. It may not cover all possible information. If you have questions about this medicine, talk to your doctor, pharmacist, or health care provider.  2018 Elsevier/Gold Standard (2016-03-08 14:17:49)

## 2016-06-07 NOTE — Progress Notes (Signed)
I have reviewed this encounter including the documentation in this note and/or discussed this patient with the provider, Cassell Smiles, AGPCNP-BC. I am certifying that I agree with the content of this note as supervising physician.  Nobie Putnam, [facility] 06/07/2016, 5:22 PM

## 2016-06-10 ENCOUNTER — Telehealth: Payer: Self-pay

## 2016-06-10 NOTE — Telephone Encounter (Signed)
-----   Message from Mikey College, NP sent at 06/10/2016  9:44 AM EDT ----- Regarding: Schedule follow up I thought Alyssa Perkins had an appointment already with Dr. Raliegh Ip when I saw her last Thursday, but she doesn't.  I started a new medicine and would like to see her back in the clinic for follow-up for "depression/new Zoloft" in about 1 month from her appointment on 3/22.  She can choose to see me or Dr. Raliegh Ip, since he is her PCP.

## 2016-06-17 ENCOUNTER — Telehealth: Payer: Self-pay | Admitting: Family Medicine

## 2016-06-17 NOTE — Telephone Encounter (Signed)
Pt is out of toujeo and asked if a refill request was faxed to [facility].  Her call back number is [phone]

## 2016-06-17 NOTE — Telephone Encounter (Signed)
Notified patient that refill was sent on 06/06/16. Patient will call and get ship date then call our office back to let us know is it needs to be sent to local [place] until mail order arrives.

## 2016-07-02 ENCOUNTER — Emergency Department
Admission: EM | Admit: 2016-07-02 | Discharge: 2016-07-02 | Disposition: A | Discharge status: Home / Self Care | Payer: Medicare Other | Attending: Emergency Medicine | Admitting: Emergency Medicine

## 2016-07-02 ENCOUNTER — Emergency Department: Payer: Medicare Other

## 2016-07-02 ENCOUNTER — Telehealth: Payer: Self-pay | Admitting: *Deleted

## 2016-07-02 DIAGNOSIS — W010XXA Fall on same level from slipping, tripping and stumbling without subsequent striking against object, initial encounter: Secondary | ICD-10-CM | POA: Insufficient documentation

## 2016-07-02 DIAGNOSIS — I10 Essential (primary) hypertension: Secondary | ICD-10-CM | POA: Insufficient documentation

## 2016-07-02 DIAGNOSIS — R51 Headache: Secondary | ICD-10-CM | POA: Diagnosis not present

## 2016-07-02 DIAGNOSIS — W19XXXA Unspecified fall, initial encounter: Secondary | ICD-10-CM

## 2016-07-02 DIAGNOSIS — M542 Cervicalgia: Secondary | ICD-10-CM | POA: Insufficient documentation

## 2016-07-02 DIAGNOSIS — Z79899 Other long term (current) drug therapy: Secondary | ICD-10-CM | POA: Diagnosis not present

## 2016-07-02 DIAGNOSIS — Y939 Activity, unspecified: Secondary | ICD-10-CM | POA: Diagnosis not present

## 2016-07-02 DIAGNOSIS — Z008 Encounter for other general examination: Secondary | ICD-10-CM | POA: Diagnosis not present

## 2016-07-02 DIAGNOSIS — Y999 Unspecified external cause status: Secondary | ICD-10-CM | POA: Insufficient documentation

## 2016-07-02 DIAGNOSIS — E119 Type 2 diabetes mellitus without complications: Secondary | ICD-10-CM | POA: Diagnosis not present

## 2016-07-02 DIAGNOSIS — Z794 Long term (current) use of insulin: Secondary | ICD-10-CM | POA: Diagnosis not present

## 2016-07-02 DIAGNOSIS — Y92481 Parking lot as the place of occurrence of the external cause: Secondary | ICD-10-CM | POA: Insufficient documentation

## 2016-07-02 DIAGNOSIS — S199XXA Unspecified injury of neck, initial encounter: Secondary | ICD-10-CM | POA: Diagnosis not present

## 2016-07-02 DIAGNOSIS — J45909 Unspecified asthma, uncomplicated: Secondary | ICD-10-CM | POA: Diagnosis not present

## 2016-07-02 DIAGNOSIS — Z87891 Personal history of nicotine dependence: Secondary | ICD-10-CM | POA: Insufficient documentation

## 2016-07-02 DIAGNOSIS — S0990XA Unspecified injury of head, initial encounter: Secondary | ICD-10-CM | POA: Diagnosis not present

## 2016-07-02 MED ORDER — CYCLOBENZAPRINE HCL 5 MG PO TABS: 2.5000 mg | ORAL_TABLET | Freq: Three times a day (TID) | ORAL | 0 refills | Status: AC | PRN

## 2016-07-02 NOTE — ED Triage Notes (Signed)
Tripped in parking lot on Sunday.  Today patient presents c/o itching and burning to right face and arm, also c/o right knee and foot pain.    Ambulates into triage independently, without difficulty.  Moving all extremities equally and strong.  NAD

## 2016-07-02 NOTE — Telephone Encounter (Signed)
Patient called and stated she fell on Sunday. She hit her head and states head hurts, eye feels funny and whole left side hurting.  Patient was advised to go to ED. She may need CT with head injury. Patient verbalizes understanding.

## 2016-07-02 NOTE — ED Provider Notes (Signed)
[facility] Emergency Department Provider Note  ____________________________________________  Time seen: Approximately 5:10 PM  I have reviewed the triage vital signs and the nursing notes.   HISTORY  Chief Complaint Fall    HPI VANESA RENIER is a 66 y.o. female that presents to emergency department with headache and neck pain [place] Patient hit the right side of her head during fall. No loss of consciousness. She states after the fall the right side of her face has been itching. She is also having pain that begins in her neck and goes down her back and occasionally into her arms. She states that both elbows and both knees hurt. She has been walking normally but "whole body hurts." She is wondering if she can have a full body scan like she had several years ago. She states that she does not think that anything is broken. She states that she has uncontrolled diabetes and sugars generally run in the upper 300s. She states that her doctor knows that her blood sugars are uncontrolled and she thinks her sugars are so high because of stress. She denies visual changes, shortness of breath, chest pain, nausea, vomiting, abdominal pain.   Past Medical History:  Diagnosis Date  . Anginal pain ([place])   . Anxiety    not on any medication at this time  . Arthritis   . Asthma    "sports asthma or exercised induced"seen at [facility] for primary  . Bronchitis    hx of  . Chronic back pain   . Chronic neck pain   . Complication of anesthesia    "difficulty waking up from surgery"  . Depression    has OCD, states counts constantly  . Diabetes mellitus without complication ([place])   . GERD (gastroesophageal reflux disease)   . Headache(784.0)    hx of migraines  . Hyperlipidemia   . IBS (irritable bowel syndrome)   . Pneumonia    hx of  . PONV (postoperative nausea and vomiting)   . RLS (restless  legs syndrome)   . Sleep apnea   . Thyroid disease     Patient Active Problem List   Diagnosis Date Noted  . Bilateral carpal tunnel syndrome 03/26/2016  . De Quervain's tenosynovitis, left 11/14/2015  . DM (diabetes mellitus), type 2, uncontrolled w/neurologic complication ([place]) 50/27/7412  . Chills with fever 05/16/2015  . Viral gastroenteritis due to [place]-like agent 05/16/2015  . Dyslipidemia 09/16/2014  . Hypertension 09/16/2014  . Acid reflux 09/16/2014  . Cervical radiculopathy 12/30/2011    Past Surgical History:  Procedure Laterality Date  . ABDOMINAL HYSTERECTOMY     also had oophorectomy  . ANTERIOR CERVICAL DECOMP/DISCECTOMY FUSION  01/02/2012   Procedure: ANTERIOR CERVICAL DECOMPRESSION/DISCECTOMY FUSION 2 LEVELS;  Surgeon: Melina Schools, MD;  Location: [facility];  Service: Orthopedics;  Laterality: N/A;  ACDF C5-7  . BILATERAL CARPAL TUNNEL RELEASE    . CARDIAC CATHETERIZATION     done approx. 4- 5 years ago, Dr. Leanor Kail, does not see cardi. at this time.  . CHOLECYSTECTOMY    . DILATION AND CURETTAGE OF UTERUS     "several"  . HERNIA REPAIR    . KNEE SURGERY     1 partial left, 1 total left  . SPINE SURGERY      Prior to Admission medications   Medication Sig Start Date End Date Taking? Authorizing Provider  Alpha-Lipoic Acid 600 MG  CAPS Take 1 capsule (600 mg total) by mouth daily. 03/29/16   [name], DO  cyclobenzaprine (FLEXERIL) 5 MG tablet Take 0.5 tablets (2.5 mg total) by mouth 3 (three) times daily as needed for muscle spasms. 07/02/16 07/09/16  Laban Emperor, PA-C  Elastic Bandages & Supports (MEDICAL COMPRESSION STOCKINGS) MISC 1 each by Does not apply route daily. 11/14/15   Amy Overton Mam, NP  gabapentin (NEURONTIN) 100 MG capsule Take 4 capsules in AM and 4 capsules in PM 06/06/16   [name], DO  glipiZIDE (GLUCOTROL) 5 MG tablet Take 1 tablet (5 mg total) by mouth 2 (two) times daily before a meal. 11/14/15   Amy  Overton Mam, NP  glucose blood (FREESTYLE TEST STRIPS) test strip Use as instructed 03/29/16   [name], DO  glucose monitoring kit (FREESTYLE) monitoring kit 1 each by Does not apply route as needed for other. CHeck blood glucose once daily before insulin injections. For ICD 10 E11.65 11/14/15   Amy Overton Mam, NP  hydrOXYzine (ATARAX/VISTARIL) 25 MG tablet Take 1 tablet (25 mg total) by mouth daily as needed for itching. For itching 01/25/16   Arlis Porta., MD  Insulin Glargine (TOUJEO SOLOSTAR) 300 UNIT/ML SOPN Inject 16 Units into the skin daily. 06/06/16   [name], DO  Insulin Pen Needle 31G X 5 MM MISC 1 each by Does not apply route daily. Use to inject Toujeo daily. Dx:E11.9 Patient taking differently: 1 each by Does not apply route daily. Use to inject Toujeo daily. Dx:E11.9 11/28/15   Amy Overton Mam, NP  Lancets (FREESTYLE) lancets Use as instructed 03/29/16   [name], DO  naproxen (NAPROSYN) 500 MG tablet Take 1 tablet (500 mg total) by mouth 2 (two) times daily with a meal. For 2-4 weeks then as needed Patient not taking: Reported on 06/06/2016 03/29/16   [name], DO  omeprazole (PRILOSEC) 20 MG capsule Take 1 capsule (20 mg total) by mouth daily. 11/14/15   Amy Overton Mam, NP  Probiotic CAPS Take 1 capsule by mouth daily. 06/06/16   Mikey College, NP  psyllium (METAMUCIL SMOOTH TEXTURE) 28 % packet Take 1/4 packet by mouth 2 (two) times per day for 2 weeks.  Take 1/2 packet 2(two) times per day after. 06/06/16   Mikey College, NP  quinapril (ACCUPRIL) 20 MG tablet Take 1 tablet (20 mg total) by mouth at bedtime. 11/14/15   Amy Overton Mam, NP  sertraline (ZOLOFT) 50 MG tablet Take 1 tablet (50 mg total) by mouth daily. 06/06/16   Mikey College, NP  simvastatin (ZOCOR) 40 MG tablet Take 1 tablet (40 mg total) by mouth at bedtime. Reported on 05/16/2015 03/29/16   [name], DO     Allergies Codeine  Family History  Problem Relation Age of Onset  . Heart disease Mother   . Osteoporosis Mother   . Osteoporosis Sister   . Cancer Brother     sinus, bone  . Osteoporosis Brother     Social History Social History  Substance Use Topics  . Smoking status: Former Smoker    Types: Cigarettes    Quit date: 07/17/1994  . Smokeless tobacco: Former Systems developer     Comment: stopped 18 years ago  . Alcohol use No     Review of Systems  Constitutional: No fever/chills ENT: No upper respiratory complaints. Cardiovascular: No chest pain. Respiratory:  No SOB. Gastrointestinal: No abdominal pain.  No nausea,  no vomiting.  Musculoskeletal: Positive for musculoskeletal pain. Skin: Negative for rash, abrasions, lacerations, ecchymosis. Neurological: Negative for numbness or tingling. Positive for headache.   ____________________________________________   PHYSICAL EXAM:  VITAL SIGNS: ED Triage Vitals  Enc Vitals Group     BP 07/02/16 1550 130/70     Pulse --      Resp --      Temp --      Temp src --      SpO2 --      Weight 07/02/16 1547 218 lb (98.9 kg)     Height 07/02/16 1547 5' 3"  (1.6 m)     Head Circumference --      Peak Flow --      Pain Score 07/02/16 1547 9     Pain Loc --      Pain Edu? --      Excl. in [place]? --      Constitutional: Alert and oriented. Well appearing and in no acute distress. Eyes: Conjunctivae are normal. PERRL. EOMI. Head: Atraumatic. ENT:      Ears:      Nose: No congestion/rhinnorhea.      Mouth/Throat: Mucous membranes are moist.  Neck: No stridor. No cervical spine tenderness to palpation. Tenderness to palpation over trapezius muscle. Cardiovascular: Normal rate, regular rhythm.  Good peripheral circulation. Respiratory: Normal respiratory effort without tachypnea or retractions. Lungs CTAB. Good air entry to the bases with no decreased or absent breath sounds. Gastrointestinal: Bowel sounds 4 quadrants. Soft and  nontender to palpation. No guarding or rigidity. No palpable masses. No distention. Musculoskeletal: Full range of motion to all extremities. No gross deformities appreciated. Diffuse tenderness to palpation over arms, back, legs. No focal tenderness. Neurologic:  Normal speech and language. No gross focal neurologic deficits are appreciated.  Skin:  Skin is warm, dry and intact. No rash noted.   ____________________________________________   LABS (all labs ordered are listed, but only abnormal results are displayed)  Labs Reviewed - No data to display ____________________________________________  EKG   ____________________________________________  RADIOLOGY   Ct Head Wo Contrast  Result Date: 07/02/2016 CLINICAL DATA:  Fall 2 days ago with burning and itching of right face and arm. EXAM: CT HEAD WITHOUT CONTRAST CT CERVICAL SPINE WITHOUT CONTRAST TECHNIQUE: Multidetector CT imaging of the head and cervical spine was performed following the standard protocol without intravenous contrast. Multiplanar CT image reconstructions of the cervical spine were also generated. COMPARISON:  Head CT 07/16/2010 FINDINGS: CT HEAD FINDINGS Brain: Ventricles, cisterns and other CSF spaces are within normal. There is no mass, mass effect, shift of midline structures or acute hemorrhage. No evidence of acute infarction. Vascular: Minimal calcified plaque over the cavernous segment of the internal carotid arteries. Skull: Within normal. Sinuses/Orbits: Within normal. Other: None. CT CERVICAL SPINE FINDINGS Alignment: Within normal. Skull base and vertebrae: Anterior fusion hardware intact from C5-C7 with intervertebral cages at the intervening disc spaces. Mild spondylosis throughout the cervical spine. Minimal uncovertebral joint spurring. Mild facet arthropathy. Mild left-sided neural foraminal narrowing at the C2-3 level with moderate right-sided neural foraminal narrowing at the C3-4 level. Moderate  right-sided neural foraminal narrowing at the C4-5 level. Minimal right-sided neural foraminal narrowing at the C6-7 level. No acute fracture. Soft tissues and spinal canal: Prevertebral soft tissues are within normal. Spinal canal is unremarkable. Disc levels:  Intervertebral cages at the C5-6 and C6-7 levels. Upper chest: Within normal. Other: None. IMPRESSION: No acute intracranial findings. No acute cervical spine injury. Mild  spondylosis throughout the cervical spine with bilateral neural foraminal narrowing as described at multiple levels. Anterior fusion hardware intact from C5-C7. Electronically Signed   By: Marin Olp M.D.   On: 07/02/2016 17:54   Ct Cervical Spine Wo Contrast  Result Date: 07/02/2016 CLINICAL DATA:  Fall 2 days ago with burning and itching of right face and arm. EXAM: CT HEAD WITHOUT CONTRAST CT CERVICAL SPINE WITHOUT CONTRAST TECHNIQUE: Multidetector CT imaging of the head and cervical spine was performed following the standard protocol without intravenous contrast. Multiplanar CT image reconstructions of the cervical spine were also generated. COMPARISON:  Head CT 07/16/2010 FINDINGS: CT HEAD FINDINGS Brain: Ventricles, cisterns and other CSF spaces are within normal. There is no mass, mass effect, shift of midline structures or acute hemorrhage. No evidence of acute infarction. Vascular: Minimal calcified plaque over the cavernous segment of the internal carotid arteries. Skull: Within normal. Sinuses/Orbits: Within normal. Other: None. CT CERVICAL SPINE FINDINGS Alignment: Within normal. Skull base and vertebrae: Anterior fusion hardware intact from C5-C7 with intervertebral cages at the intervening disc spaces. Mild spondylosis throughout the cervical spine. Minimal uncovertebral joint spurring. Mild facet arthropathy. Mild left-sided neural foraminal narrowing at the C2-3 level with moderate right-sided neural foraminal narrowing at the C3-4 level. Moderate right-sided neural  foraminal narrowing at the C4-5 level. Minimal right-sided neural foraminal narrowing at the C6-7 level. No acute fracture. Soft tissues and spinal canal: Prevertebral soft tissues are within normal. Spinal canal is unremarkable. Disc levels:  Intervertebral cages at the C5-6 and C6-7 levels. Upper chest: Within normal. Other: None. IMPRESSION: No acute intracranial findings. No acute cervical spine injury. Mild spondylosis throughout the cervical spine with bilateral neural foraminal narrowing as described at multiple levels. Anterior fusion hardware intact from C5-C7. Electronically Signed   By: Marin Olp M.D.   On: 07/02/2016 17:54    ____________________________________________    PROCEDURES  Procedure(s) performed:    Procedures    Medications - No data to display   ____________________________________________   INITIAL IMPRESSION / ASSESSMENT AND PLAN / ED COURSE  Pertinent labs & imaging results that were available during my care of the patient were reviewed by me and considered in my medical decision making (see chart for details).  Review of the [place] CSRS was performed in accordance of the [place] prior to dispensing any controlled drugs.     Patient's diagnosis is consistent with musculoskeletal pain after fall. Vital signs and exam are reassuring. Head CT and cervical CT negative for acute processes. Patient appears well and is up walking around the room without difficulty. She is laughing and using extreme hand and arm motions with friend that is in the room. Patient will be discharged home with prescriptions for a low dose of Flexeril. Patient is to follow up with PCP as directed. Patient is given ED precautions to return to the ED for any worsening or new symptoms.     ____________________________________________  FINAL CLINICAL IMPRESSION(S) / ED DIAGNOSES  Final diagnoses:  Fall, initial encounter      NEW MEDICATIONS STARTED DURING THIS  VISIT:  Discharge Medication List as of 07/02/2016  6:42 PM    START taking these medications   Details  cyclobenzaprine (FLEXERIL) 5 MG tablet Take 0.5 tablets (2.5 mg total) by mouth 3 (three) times daily as needed for muscle spasms., Starting Tue 07/02/2016, Until Tue 07/09/2016, Print            This chart was dictated using voice recognition software/Dragon. Despite  best efforts to proofread, errors can occur which can change the meaning. Any change was purely unintentional.    Laban Emperor, PA-C 07/02/16 [place], MD 07/03/16 1538

## 2016-07-08 ENCOUNTER — Encounter: Payer: Self-pay | Admitting: Family Medicine

## 2016-07-08 ENCOUNTER — Ambulatory Visit (INDEPENDENT_AMBULATORY_CARE_PROVIDER_SITE_OTHER): Payer: Medicare Other | Admitting: Family Medicine

## 2016-07-08 VITALS — BP 109/72 | HR 71 | Temp 98.4°F | Resp 16 | Ht 63.0 in | Wt 221.0 lb

## 2016-07-08 DIAGNOSIS — Z794 Long term (current) use of insulin: Secondary | ICD-10-CM

## 2016-07-08 DIAGNOSIS — E1140 Type 2 diabetes mellitus with diabetic neuropathy, unspecified: Secondary | ICD-10-CM

## 2016-07-08 DIAGNOSIS — E1165 Type 2 diabetes mellitus with hyperglycemia: Secondary | ICD-10-CM

## 2016-07-08 DIAGNOSIS — W19XXXA Unspecified fall, initial encounter: Secondary | ICD-10-CM

## 2016-07-08 DIAGNOSIS — M25511 Pain in right shoulder: Secondary | ICD-10-CM | POA: Diagnosis not present

## 2016-07-08 DIAGNOSIS — G2581 Restless legs syndrome: Secondary | ICD-10-CM | POA: Insufficient documentation

## 2016-07-08 DIAGNOSIS — IMO0002 Reserved for concepts with insufficient information to code with codable children: Secondary | ICD-10-CM

## 2016-07-08 LAB — POCT GLYCOSYLATED HEMOGLOBIN (HGB A1C): HEMOGLOBIN A1C: 13.5

## 2016-07-08 NOTE — Patient Instructions (Signed)
Thank you for coming in to clinic today.  1. Diabetes is uncontrolled. A1c is up from 12 to 13.5% - Start with 20 units Toujeo - Once you start checking blood sugars (let our office know the name of your meter and if you need test strips), 3 times a day, fasting in morning, 2 hours after lunch, 2 hours after dinner, write down your readings - If morning fasting blood sugar is >150 on average (at least 4 times in 1 week) then increase Toujeo by 1 unit every week until you reach 30 units - Continue taking Glipizide 5mg  WITH MEAL, twice a day - Start eating breakfast everyday, cheerios with milk, no more toast only in breakfast  Referral to [facility] [address] [place], [place] Phone: [phone]  The 5 Minute Rule of Exercise - Promise yourself to at least do 5 minutes of exercise (make sure you time it), and if at the end of 5 minutes (this is the hardest part of the work-out), if you still feel like you want stop (or not motivated to continue) then allow yourself to stop. Otherwise, more often than not you will feel encouraged that you can continue for a little while longer or even more!  Take Flexeril for your R shoulder, this will help the muscles  Diet Recommendations for Diabetes   Reduce Starchy (carb) foods include: Bread, rice, pasta, potatoes, corn, crackers, bagels, muffins, all baked goods.   Protein foods include: Meat, fish, poultry, eggs, dairy foods, and beans such as pinto and kidney beans (beans also provide carbohydrate).   1. Eat at least 3 meals and 1-2 snacks per day. Never go more than 4-5 hours while awake without eating.   2. Limit starchy foods to TWO per meal and ONE per snack. ONE portion of a starchy  food is equal to the following:   - ONE slice of bread (or its equivalent, such as half of a hamburger bun).   - 1/2 cup of a "scoopable" starchy food such as potatoes or rice.   - 1 OUNCE (28 grams) of starchy  snacks (crackers or pretzels, look on label).   - 15 grams of carbohydrate as shown on food label.   3. Both lunch and dinner should include a protein food, a carb food, and vegetables.   - Obtain twice as many veg's as protein or carbohydrate foods for both lunch and dinner.   - Try to keep frozen veg's on hand for a quick vegetable serving.     - Fresh or frozen veg's are best.   4. Breakfast should always include protein.    Please schedule a follow-up appointment with Dr. Parks Ranger in 3 months - for Diabetes, A1c, Neuropathy  If you have any other questions or concerns, please feel free to call the clinic or send a message through [place]. You may also schedule an earlier appointment if necessary.  Nobie Putnam, DO [place]

## 2016-07-08 NOTE — Progress Notes (Signed)
Subjective:    Patient ID: Alyssa Perkins, female    DOB: 16-Jun-1950, 66 y.o.   MRN: [id number]  Alyssa Perkins is a 66 y.o. female presenting on 07/08/2016 for Diabetes and Fall ([place]. Patient hit the right side of her head during fall knee pain shoulder pain and neck pain rediate down to back pain)   HPI   CHRONIC DM, Type 2: - Last visit with me for DM 03/25/16, she had notable worsening A1c control, and had Toujeo increased also she was non adhering to Glipizide, increased this dose - Today she reports that she was off Toujeo for 9 days waiting on medicine to arrive through [facility], she also admits to taking 16 units again for while instead of increasing back to 20 units as discussed in office visit, did not titrate up beyond 20. She seems to be taking Glipizide back BID - Now A1c still significantly worse now >13%. Attributes this to lack of Toujeo and also very poor dietary habits, admits eating sweet foods, late at night CBGs: Infrequent CBG checking, does not have readings today Meds: Toujeo 16 units daily (takes mid morning) (she had increased up to 20 units, then recently reduced back to 16 after reading her rx box stating 16u over past 1 month), Glipizide BID - Unable to take Metformin due to migraines. 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 She was given rx for Flexeril but has not started this yet due to mail order pharmacy   Social History  Substance Use Topics  . Smoking status: Former Smoker    Types: Cigarettes    Quit date: 07/17/1994  . Smokeless tobacco: Former Systems developer     Comment: stopped 18 years ago  . Alcohol use No    Review of Systems Per HPI unless specifically indicated above     Objective:    BP 109/72   Pulse 71   Temp 98.4 F (36.9 C) (Oral)   Resp 16   Ht 5\' 3"  (1.6 m)   Wt 221 lb (100.2 kg)   BMI 39.15 kg/m   Wt Readings from Last 3 Encounters:  07/08/16 221 lb (100.2 kg)  07/02/16 218 lb (98.9 kg)  06/06/16 218 lb (98.9 kg)    Physical Exam  Constitutional: She appears well-developed and well-nourished. No distress.  Well-appearing, comfortable, cooperative, obese  HENT:  Head: Normocephalic and atraumatic.  Mouth/Throat: Oropharynx is clear and moist.  Eyes: Conjunctivae are normal.  Neck: Normal range of motion. Neck supple.  Cardiovascular: Normal rate, regular rhythm, normal heart sounds and intact distal pulses.   No murmur heard. Pulmonary/Chest: Effort normal and breath sounds normal. No respiratory distress. She has no wheezes. She has no rales.  Musculoskeletal: She exhibits no edema.  Right Shoulder Inspection: Normal appearance bilateral symmetrical Palpation: Non-tender to palpation over anterior, lateral. Mild  tender to palpation over R posterior shoulder and trapezius muscles with some hypertonicity and spasm. ROM: Mostly full forward flexion, limited with reduced abduction above shoulder and internal rotation behind back due to pain Special Testing: Rotator cuff testing negative for weakness with supraspinatus full can and empty can test. Hawkin's AC impingement positive for pain on Right Strength: Normal strength 5/5 flex/ext, ext rot / int rot, grip, rotator cuff str testing. Neurovascular: Distally intact pulses,  sensation to light touch  Neurological: She is alert.  Skin: Skin is warm and dry. No rash noted. She is not diaphoretic. No erythema.  Psychiatric: She has a normal mood and affect. Her behavior is normal.  Nursing note and vitals reviewed.  I have personally reviewed the radiology report from Head / C Spine CT 07/02/16.  CLINICAL DATA:  Fall 2 days ago with burning and itching of right face and arm.  EXAM: CT HEAD WITHOUT CONTRAST  CT CERVICAL SPINE WITHOUT CONTRAST  TECHNIQUE: Multidetector CT imaging of the head and cervical spine was performed following the standard protocol without intravenous contrast. Multiplanar CT image reconstructions of the cervical spine were also generated.  COMPARISON:  Head CT 07/16/2010  FINDINGS: CT HEAD FINDINGS  Brain: Ventricles, cisterns and other CSF spaces are within normal. There is no mass, mass effect, shift of midline structures or acute hemorrhage. No evidence of acute infarction.  Vascular: Minimal calcified plaque over the cavernous segment of the internal carotid arteries.  Skull: Within normal.  Sinuses/Orbits: Within normal.  Other: None.  CT CERVICAL SPINE FINDINGS  Alignment: Within normal.  Skull base and vertebrae: Anterior fusion hardware intact from C5-C7 with intervertebral cages at the intervening disc spaces. Mild spondylosis throughout the cervical spine. Minimal uncovertebral joint spurring. Mild facet arthropathy. Mild left-sided neural foraminal narrowing at the C2-3 level with moderate right-sided neural foraminal narrowing at the C3-4 level. Moderate right-sided neural foraminal narrowing at the C4-5 level. Minimal right-sided neural foraminal narrowing at the C6-7 level. No acute fracture.  Soft tissues and spinal canal: Prevertebral soft tissues are within normal. Spinal canal is unremarkable.  Disc levels:  Intervertebral cages at the C5-6 and C6-7 levels.  Upper chest: Within  normal.  Other: None.  IMPRESSION: No acute intracranial findings.  No acute cervical spine injury.  Mild spondylosis throughout the cervical spine with bilateral neural foraminal narrowing as described at multiple levels. Anterior fusion hardware intact from C5-C7.   Electronically Signed   By: Marin Olp M.D.   On: 07/02/2016 17:54  Results for orders placed or performed in visit on 07/08/16  POCT HgB A1C  Result Value Ref Range   Hemoglobin A1C 13.5       Assessment & Plan:   Problem List Items Addressed This Visit    DM (diabetes mellitus), type 2, uncontrolled w/neurologic complication ([place]) - Primary    Still significant worsening DM control in 3 months - A1c from 12.8 now to 13.5%, again variety of reasons non adherence to insulin titration, delayed received med from pharmacy, poor lifestyle no DM diet and no regular exercise - Complication with peripheral neuropathy  Plan: 1. Discussion on management of DM given gradual worsening despite our management and follow-up she needs additional help, advised referral to Endocrinology, declines local [facility], request [facility], referral placed, will likely need additional DM education and med management / assistance, improved diet and lifestyle to improve A1c control 2. Advised to titrate Toujeo up now beyond 20 units, can go to max  30, up by 1 every 1 week if fasting CBG avg >150 in AM, continue GLipizide BID 3. Start checking CBG, notify us if need new glucometer or test supplies, need name of meter, or can try [employer] brand otc, bring in CBG log TID checking, or limited ability to titrate her insulin safely as advised 4. Need to eat breakfast daily, regular 3 meal daily, advised low carb diet, changes, start regular walking exercise 5. Continue Gabapentin for now - will need to adjust in future if not helping 6. Follow-up with Endocrine for A1c management, if needed can return here for  neuropathy or other related issues       Relevant Orders   POCT HgB A1C (Completed)   Ambulatory referral to Endocrinology    Other Visit Diagnoses    Acute pain of right shoulder       Fall, initial encounter      Improved R shoulder MSK pain following recent mechanical fall, without symptoms triggering her to fall.  - Reviewed CT imaging in ED negative for acute injury but known C-spine DJD disease - Advised to continue Flexeril once receives it, work on ROM exercises, no evidence of rotator tendinopathy - Follow-up as needed       No orders of the defined types were placed in this encounter.   Follow up plan: Return in about 3 months (around 10/07/2016) for diabetes, A1c.  Nobie Putnam, [facility] 07/09/2016, 7:04 AM

## 2016-07-09 NOTE — Assessment & Plan Note (Signed)
Still significant worsening DM control in 3 months - A1c from 12.8 now to 13.5%, again variety of reasons non adherence to insulin titration, delayed received med from pharmacy, poor lifestyle no DM diet and no regular exercise - Complication with peripheral neuropathy  Plan: 1. Discussion on management of DM given gradual worsening despite our management and follow-up she needs additional help, advised referral to Endocrinology, declines local [facility], request [facility] Endocrinology, referral placed, will likely need additional DM education and med management / assistance, improved diet and lifestyle to improve A1c control 2. Advised to titrate Toujeo up now beyond 20 units, can go to max 30, up by 1 every 1 week if fasting CBG avg >150 in AM, continue GLipizide BID 3. Start checking CBG, notify us if need new glucometer or test supplies, need name of meter, or can try [employer] brand otc, bring in CBG log TID checking, or limited ability to titrate her insulin safely as advised 4. Need to eat breakfast daily, regular 3 meal daily, advised low carb diet, changes, start regular walking exercise 5. Continue Gabapentin for now - will need to adjust in future if not helping 6. Follow-up with Endocrine for A1c management, if needed can return here for neuropathy or other related issues

## 2016-07-25 ENCOUNTER — Encounter: Payer: Self-pay | Admitting: Endocrinology

## 2016-09-11 ENCOUNTER — Encounter: Payer: Self-pay | Admitting: Family Medicine

## 2016-09-11 ENCOUNTER — Ambulatory Visit (INDEPENDENT_AMBULATORY_CARE_PROVIDER_SITE_OTHER): Payer: Medicare Other | Admitting: Family Medicine

## 2016-09-11 VITALS — BP 125/59 | HR 84 | Ht 63.0 in | Wt 220.0 lb

## 2016-09-11 DIAGNOSIS — G8929 Other chronic pain: Secondary | ICD-10-CM

## 2016-09-11 DIAGNOSIS — M5412 Radiculopathy, cervical region: Secondary | ICD-10-CM | POA: Diagnosis not present

## 2016-09-11 DIAGNOSIS — M542 Cervicalgia: Secondary | ICD-10-CM

## 2016-09-11 DIAGNOSIS — M546 Pain in thoracic spine: Secondary | ICD-10-CM

## 2016-09-11 DIAGNOSIS — M549 Dorsalgia, unspecified: Secondary | ICD-10-CM

## 2016-09-11 MED ORDER — BACLOFEN 10 MG PO TABS: 5.0000 mg | ORAL_TABLET | Freq: Three times a day (TID) | ORAL | 1 refills | Status: DC | PRN

## 2016-09-11 NOTE — Progress Notes (Signed)
Subjective:    Patient ID: Alyssa Perkins, female    DOB: [birth year], 66 y.o.   MRN: [id number]  Alyssa Perkins is a 66 y.o. female presenting on 09/11/2016 for Back Pain (right side back pain that starts in the upper part of the back and radiates downx 4 days)  Patient presents for a same day appointment.  HPI   ACUTE on chronic BACK PAIN, R Thoracic / R neck pain / History of chronic cervical neck pain s/p surgery Reports symptoms started 4 days ago with R sided back pain with worsening from R neck and radiating down her back and across her R flank. Describes pain mostly constant moderate to severe, without improvement, worse with left rotation movements. Additionally has some chronic R sided upper extremity arm pains and tingling from her Neck, prior surgery, s/p discectomy, some radiating pain down R arm, previously followed by [facility], has not been back since her surgery - Other recent history history of fall 06/2016, last visit, ED, see last office visit. She also had another fall end of April 2018 on Left side of body - Tried a few vicodin from her daughter without relief - Taking Ibuprofen 800mg  twice daily for few days without any improvement - Prior history on muscle relaxant, would like new rx - Denies any fevers/chills, numbness, weakness, loss of control bladder/bowel incontinence or retention, unintentional wt loss, night sweats  Additional history with OSA reports she had prior sleep study at [facility] 2-3 years ago ordered by prior PCP, she was never given results except told she had sleep apnea and advised to do next sleep study likely CPAP titration, but this was never arranged.   Social History  Substance Use Topics  . Smoking status: Former Smoker    Types: Cigarettes    Quit date: 07/17/1994  . Smokeless tobacco: Former Systems developer     Comment: stopped 18 years ago  . Alcohol use No    Review of Systems Per HPI unless specifically indicated above       Objective:    BP (!) 125/59 (BP Location: Right Arm, Patient Position: Sitting, Cuff Size: Large)   Pulse 84   Ht 5\' 3"  (1.6 m)   Wt 220 lb (99.8 kg)   BMI 38.97 kg/m   Wt Readings from Last 3 Encounters:  09/11/16 220 lb (99.8 kg)  07/08/16 221 lb (100.2 kg)  07/02/16 218 lb (98.9 kg)    Physical Exam  Constitutional: She is oriented to person, place, and time. She appears well-developed and well-nourished. No distress.  Well-appearing, but uncomfortable with R back pain, cooperative  HENT:  Head: Normocephalic and atraumatic.  Eyes: Conjunctivae are normal. Right eye exhibits no discharge. Left eye exhibits no discharge.  Neck: Neck supple.  Neck Inspection: mostly normal old healed anterior surgical scar Palpation: mild hypertonicity R>L paraspinal spasm ROM: slightly limited flex/ext but mostly normal Special Testing: did not re-test spurling's today Strength: distal 5/5 upper ext Neurovascular: intact  Cardiovascular: Normal rate.   Pulmonary/Chest: Effort normal and breath sounds normal. No respiratory distress. She has no wheezes. She has no rales.  Musculoskeletal: She exhibits no edema.  Low Back Inspection: Normal appearance, Large body habitus, no spinal deformity, symmetrical. Palpation: No tenderness over spinous processes. R>L  Mid to lower thoracic and upper lumbar paraspinal muscles tender and with hypertonicity/spasm. ROM: Full active ROM forward flex / back extension, with limited Left rotation due to stiffness and pain Special Testing: Seated SLR negative for  radicular pain bilaterally Strength: Bilateral hip flex/ext 5/5, knee flex/ext 5/5, ankle dorsiflex/plantarflex 5/5 Neurovascular: intact distal sensation to light touch  Neurological: She is alert and oriented to person, place, and time.  Skin: Skin is warm and dry. No rash noted. She is not diaphoretic. No erythema.  Psychiatric: She has a normal mood and affect. Her behavior is normal.  Nursing  note and vitals reviewed.      Assessment & Plan:   Problem List Items Addressed This Visit    Chronic neck pain   Relevant Medications   baclofen (LIORESAL) 10 MG tablet   Chronic back pain - Primary   Relevant Medications   baclofen (LIORESAL) 10 MG tablet   Cervical radiculopathy   Relevant Medications   baclofen (LIORESAL) 10 MG tablet   Acute on chronic R LBP without associated sciatica. Suspect likely due to muscle spasm/strain, without known injury or trauma. In setting of known chronic back and neck pain with DJD, prior C-spine surgerysurgery. - No red flag symptoms. Negative SLR for radiculopathy - Not responding to current therapy NSAIDs, some opiates  Plan: 1. Start muscle relaxant with Baclofen 10mg  tabs - take 5-10mg  up to TID PRN, titrate up as tolerated 2. May use Tylenol PRN for breakthrough 3. Encouraged use of heating pad 1-2x daily for now then PRN 4. Since no injury or trauma - will defer repeat x-rays 5. Follow-up 4-6 weeks if not improved, re-evaluation. If not improved consider X-ray imaging given >6 weeks. Consider trial of PT for strengthening. Regardless, encouraged her to schedule follow-up with [facility] regarding her C-spine and concern with some radiculopathy.      Meds ordered this encounter  Medications  . baclofen (LIORESAL) 10 MG tablet    Sig: Take 0.5-1 tablets (5-10 mg total) by mouth 3 (three) times daily as needed for muscle spasms.    Dispense:  30 each    Refill:  1      Follow up plan: Return in about 4 weeks (around 10/09/2016), or if symptoms worsen or fail to improve, for Back pain.  Nobie Putnam, [facility] 09/11/2016, 9:50 PM

## 2016-09-11 NOTE — Patient Instructions (Addendum)
Thank you for coming to the clinic today.  1. For your Back Pain - I think that this is due to Muscle Spasms or strain  3. Start Baclofen (Lioresal) 10mg  tablets - cut in half for 5mg  at night for muscle relaxant - may make you sedated or sleepy (be careful driving or working on this) if tolerated you can take every 8 hours, half or whole tab  4. May use Tylenol Extra Str 500mg  tabs - may take 1-2 tablets every 6 hours as needed  5. Recommend to start using heating pad on your lower back 1-2x daily for few weeks  This pain may take weeks to months to fully resolve, but hopefully it will respond to the medicine initially. All back injuries (small or serious) are slow to heal since we use our back muscles every day. Be careful with turning, twisting, lifting, sitting / standing for prolonged periods, and avoid re-injury.  If your symptoms significantly worsen with more pain, or new symptoms with weakness in one or both legs, new or different shooting leg pains, numbness in legs or groin, loss of control or retention of urine or bowel movements, please call back for advice and you may need to go directly to the Emergency Department.  Schedule to follow-up with your [facility] doctor, have them fax [place] the record  Please schedule a Follow-up Appointment to: Return in about 4 weeks (around 10/09/2016), or if symptoms worsen or fail to improve, for Back pain.  If you have any other questions or concerns, please feel free to call the clinic or send a message through [place]. You may also schedule an earlier appointment if necessary.  Additionally, you may be receiving a survey about your experience at our clinic within a few days to 1 week by e-mail or mail. We value your feedback.  Nobie Putnam, DO [place]

## 2016-09-12 ENCOUNTER — Telehealth: Payer: Self-pay

## 2016-09-12 NOTE — Telephone Encounter (Signed)
-----   Message from [name], DO sent at 09/11/2016  1:58 PM EDT ----- Regarding: [place] Sleep Study Patient was seen today 09/11/16 for acute back pain, but also had complaint of asking about her sleep study. She reportedly had a sleep study for OSA 2-3 years ago at [facility] and she was never told the result. She was supposed to have a follow-up CPAP titration or 2nd sleep study, but was never notified.  Could you check with [facility] to find any status of this report or how to proceed? I do not see any result in her chart. I do not know if she needs to repeat the initial sleep study or if we can proceed with CPAP titration.  Thanks

## 2016-09-12 NOTE — Telephone Encounter (Signed)
I called over to [facility] to find out the status on the pt previous sleep study. They informed me that the pt had a baseline PSG study done w/ positive results, but since it's been (3) three years she will have to repeat this again. I faxed over the referral for a Baseline PSG.

## 2016-09-12 NOTE — Telephone Encounter (Deleted)
-----   Message from [name], DO sent at 09/11/2016  1:58 PM EDT ----- Regarding: [place] Sleep Study Patient was seen today 09/11/16 for acute back pain, but also had complaint of asking about her sleep study. She reportedly had a sleep study for OSA 2-3 years ago at [facility] and she was never told the result. She was supposed to have a follow-up CPAP titration or 2nd sleep study, but was never notified.  Could you check with [facility] to find any status of this report or how to proceed? I do not see any result in her chart. I do not know if she needs to repeat the initial sleep study or if we can proceed with CPAP titration.  Thanks

## 2016-09-13 ENCOUNTER — Encounter: Payer: Self-pay | Admitting: Emergency Medicine

## 2016-09-13 ENCOUNTER — Emergency Department: Payer: Medicare Other

## 2016-09-13 ENCOUNTER — Emergency Department
Admission: EM | Admit: 2016-09-13 | Discharge: 2016-09-13 | Disposition: A | Discharge status: Home / Self Care | Payer: Medicare Other | Attending: Student in an Organized Health Care Education/Training Program | Admitting: Student in an Organized Health Care Education/Training Program

## 2016-09-13 DIAGNOSIS — E119 Type 2 diabetes mellitus without complications: Secondary | ICD-10-CM | POA: Diagnosis not present

## 2016-09-13 DIAGNOSIS — M542 Cervicalgia: Secondary | ICD-10-CM | POA: Diagnosis not present

## 2016-09-13 DIAGNOSIS — M545 Low back pain, unspecified: Secondary | ICD-10-CM

## 2016-09-13 DIAGNOSIS — Z79899 Other long term (current) drug therapy: Secondary | ICD-10-CM | POA: Diagnosis not present

## 2016-09-13 DIAGNOSIS — J45909 Unspecified asthma, uncomplicated: Secondary | ICD-10-CM | POA: Insufficient documentation

## 2016-09-13 DIAGNOSIS — Z794 Long term (current) use of insulin: Secondary | ICD-10-CM | POA: Insufficient documentation

## 2016-09-13 DIAGNOSIS — Z87891 Personal history of nicotine dependence: Secondary | ICD-10-CM | POA: Insufficient documentation

## 2016-09-13 DIAGNOSIS — G8929 Other chronic pain: Secondary | ICD-10-CM | POA: Diagnosis not present

## 2016-09-13 DIAGNOSIS — M50321 Other cervical disc degeneration at C4-C5 level: Secondary | ICD-10-CM | POA: Diagnosis not present

## 2016-09-13 DIAGNOSIS — I10 Essential (primary) hypertension: Secondary | ICD-10-CM | POA: Diagnosis not present

## 2016-09-13 DIAGNOSIS — M549 Dorsalgia, unspecified: Secondary | ICD-10-CM | POA: Diagnosis present

## 2016-09-13 MED ORDER — METHYLPREDNISOLONE SODIUM SUCC 125 MG IJ SOLR
125.0000 mg | Freq: Once | INTRAMUSCULAR | Status: AC
  Administered 2016-09-13: 125 mg via INTRAMUSCULAR
  Filled 2016-09-13: qty 2

## 2016-09-13 MED ORDER — ORPHENADRINE CITRATE 30 MG/ML IJ SOLN
60.0000 mg | Freq: Two times a day (BID) | INTRAMUSCULAR | Status: DC
  Administered 2016-09-13: 60 mg via INTRAMUSCULAR
  Filled 2016-09-13: qty 2

## 2016-09-13 MED ORDER — PREDNISONE 50 MG PO TABS: ORAL_TABLET | ORAL | 0 refills | Status: DC

## 2016-09-13 NOTE — ED Provider Notes (Signed)
[facility] Emergency Department Provider Note  ____________________________________________  Time seen: Approximately 10:24 PM  I have reviewed the triage vital signs and the nursing notes.   HISTORY  Chief Complaint Back Pain    HPI Alyssa Perkins is a 66 y.o. female presenting to the emergency department with 8/10 chronic low back and neck pain that has acutely worsened in intensity. She states that her back pain is constant throughout the day and is not relieved by supine or standing position. Patient states that she has experienced one fall since last seeking care for back pain. Patient denies weakness, radiculopathy, or bowel or bladder incontinence. Patient denies changes in sensation of the lower extremities bilaterally. Patient states that her primary care provider has exhausted their resources for her chronic pain management and recommended that she seek care at the emergency department.    Past Medical History:  Diagnosis Date  . Anginal pain ([place])   . Anxiety    not on any medication at this time  . Arthritis   . Asthma    "sports asthma or exercised induced"seen at [place] for primary  . Chronic back pain   . Chronic neck pain   . Complication of anesthesia    "difficulty waking up from surgery"  . Depression    has OCD, states counts constantly  . Diabetes mellitus without complication ([place])   . GERD (gastroesophageal reflux disease)   . Headache(784.0)    hx of migraines  . Hyperlipidemia   . IBS (irritable bowel syndrome)   . PONV (postoperative nausea and vomiting)   . RLS (restless legs syndrome)   . Sleep apnea   . Thyroid disease     Patient Active Problem List   Diagnosis Date Noted  . Chronic back pain 09/11/2016  . Chronic neck pain 09/11/2016  . RLS (restless legs syndrome) 07/08/2016  . Bilateral carpal tunnel syndrome 03/26/2016  . De Quervain's tenosynovitis, left 11/14/2015  . DM (diabetes  mellitus), type 2, uncontrolled w/neurologic complication ([place]) 14/97/0263  . Dyslipidemia 09/16/2014  . Hypertension 09/16/2014  . Acid reflux 09/16/2014  . Cervical radiculopathy 12/30/2011    Past Surgical History:  Procedure Laterality Date  . ABDOMINAL HYSTERECTOMY     also had oophorectomy  . ANTERIOR CERVICAL DECOMP/DISCECTOMY FUSION  01/02/2012   Procedure: ANTERIOR CERVICAL DECOMPRESSION/DISCECTOMY FUSION 2 LEVELS;  Surgeon: Melina Schools, MD;  Location: [place];  Service: Orthopedics;  Laterality: N/A;  ACDF C5-7  . BILATERAL CARPAL TUNNEL RELEASE    . CARDIAC CATHETERIZATION     done approx. 4- 5 years ago, Dr. Leanor Kail, does not see cardi. at this time.  . CHOLECYSTECTOMY    . DILATION AND CURETTAGE OF UTERUS     "several"  . HERNIA REPAIR    . KNEE SURGERY     1 partial left, 1 total left  . SPINE SURGERY      Prior to Admission medications   Medication Sig Start Date End Date Taking? Authorizing Provider  Alpha-Lipoic Acid 600 MG CAPS Take 1 capsule (600 mg total) by mouth daily. 03/29/16   Karamalegos, Devonne Doughty, DO  baclofen (LIORESAL) 10 MG tablet Take 0.5-1 tablets (5-10 mg total) by mouth 3 (three) times daily as needed for muscle spasms. 09/11/16   [name], DO  Elastic Bandages & Supports (MEDICAL COMPRESSION STOCKINGS) [place] 1 each by Does not apply route daily. 11/14/15   Luciana Axe, NP  gabapentin (NEURONTIN) 100 MG capsule  Take 4 capsules in AM and 4 capsules in PM 06/06/16   Karamalegos, Devonne Doughty, DO  glipiZIDE (GLUCOTROL) 5 MG tablet Take 1 tablet (5 mg total) by mouth 2 (two) times daily before a meal. 11/14/15   Krebs, Genevie Cheshire, NP  glucose blood (FREESTYLE TEST STRIPS) test strip Use as instructed 03/29/16   Parks Ranger, Devonne Doughty, DO  glucose monitoring kit (FREESTYLE) monitoring kit 1 each by Does not apply route as needed for other. CHeck blood glucose once daily before insulin injections. For ICD 10 E11.65 11/14/15    Luciana Axe, NP  hydrOXYzine (ATARAX/VISTARIL) 25 MG tablet Take 1 tablet (25 mg total) by mouth daily as needed for itching. For itching 01/25/16   Arlis Porta., MD  Insulin Glargine (TOUJEO SOLOSTAR) 300 UNIT/ML SOPN Inject 16 Units into the skin daily. 06/06/16   Karamalegos, Devonne Doughty, DO  Insulin Pen Needle 31G X 5 MM MISC 1 each by Does not apply route daily. Use to inject Toujeo daily. Dx:E11.9 Patient taking differently: 1 each by Does not apply route daily. Use to inject Toujeo daily. Dx:E11.9 11/28/15   Luciana Axe, NP  Lancets (FREESTYLE) lancets Use as instructed 03/29/16   Parks Ranger, Devonne Doughty, DO  omeprazole (PRILOSEC) 20 MG capsule Take 1 capsule (20 mg total) by mouth daily. 11/14/15   Luciana Axe, NP  Probiotic CAPS Take 1 capsule by mouth daily. 06/06/16   Mikey College, NP  psyllium (METAMUCIL SMOOTH TEXTURE) 28 % packet Take 1/4 packet by mouth 2 (two) times per day for 2 weeks.  Take 1/2 packet 2(two) times per day after. Patient not taking: Reported on 09/11/2016 06/06/16   Mikey College, NP  quinapril (ACCUPRIL) 20 MG tablet Take 1 tablet (20 mg total) by mouth at bedtime. 11/14/15   Luciana Axe, NP  sertraline (ZOLOFT) 50 MG tablet Take 1 tablet (50 mg total) by mouth daily. 06/06/16   Mikey College, NP  simvastatin (ZOCOR) 40 MG tablet Take 1 tablet (40 mg total) by mouth at bedtime. Reported on 05/16/2015 03/29/16   [name], DO    Allergies Codeine  Family History  Problem Relation Age of Onset  . Heart disease Mother   . Osteoporosis Mother   . Osteoporosis Sister   . Cancer Brother        sinus, bone  . Osteoporosis Brother     Social History Social History  Substance Use Topics  . Smoking status: Former Smoker    Types: Cigarettes    Quit date: 07/17/1994  . Smokeless tobacco: Former Systems developer     Comment: stopped 18 years ago  . Alcohol use No     Review of Systems  Constitutional: No  fever/chills Eyes: No visual changes. No discharge ENT: No upper respiratory complaints. Cardiovascular: no chest pain. Respiratory: no cough. No SOB. Gastrointestinal: No abdominal pain.  No nausea, no vomiting.  No diarrhea.  No constipation. Musculoskeletal: Patient has chronic low back pain and neck pain.  Skin: Negative for rash, abrasions, lacerations, ecchymosis. Neurological: Negative for headaches, focal weakness or numbness.   ____________________________________________   PHYSICAL EXAM:  VITAL SIGNS: ED Triage Vitals [09/13/16 2042]  Enc Vitals Group     BP 109/80     Pulse Rate 64     Resp 18     Temp 98.8 F (37.1 C)     Temp Source Oral     SpO2 99 %     Weight  218 lb (98.9 kg)     Height _0  (1.6 m)     Head Circumference      Peak Flow      Pain Score 10     Pain Loc      Pain Edu?      Excl. in [place]?    Constitutional: Alert and oriented. Patient is talkative and engaged.  Eyes: Palpebral and bulbar conjunctiva are nonerythematous bilaterally. PERRL. EOMI.  Head: Atraumatic. ENT:      Ears: Tympanic membranes are pearly bilaterally without bloody effusion visualized.       Nose: Nasal septum is midline.      Mouth/Throat: Mucous membranes are moist. Uvula is midline. Neck: Full range of motion. No pain with neck flexion. No pain with palpation of the cervical spine.  Cardiovascular: No pain with palpation over the anterior and posterior chest wall. Normal rate, regular rhythm. Normal S1 and S2. No murmurs, gallops or rubs auscultated.  Respiratory: Trachea is midline. Resonant and symmetric percussion tones bilaterally. On auscultation, adventitious sounds are absent.  Musculoskeletal: Patient has 5/5 strength in the upper and lower extremities bilaterally. Full range of motion at the shoulder, elbow and wrist bilaterally. Full range of motion at the hip, knee and ankle bilaterally. No changes in gait. Palpable radial, ulnar and dorsalis pedis pulses  bilaterally and symmetrically. Neurologic: Normal speech and language. No gross focal neurologic deficits are appreciated. Cranial nerves: 2-10 normal as tested. Cerebellar: Finger-nose-finger WNL, heel to shin WNL. Sensorimotor: No sensory loss or abnormal reflexes. Vision: No visual field deficts noted to confrontation.  Speech: No dysarthria or expressive aphasia.  Skin:  Skin is warm, dry and intact. No rash or bruising noted.  Psychiatric: Mood and affect are normal for age. Speech and behavior are normal.    ____________________________________________   LABS (all labs ordered are listed, but only abnormal results are displayed)  Labs Reviewed - No data to display ____________________________________________  EKG   ____________________________________________  RADIOLOGY Unk Pinto, personally viewed and evaluated these images (plain radiographs) as part of my medical decision making, as well as reviewing the written report by the radiologist.  DG Cervical Spine: DG Lumbar Spine:  No acute bony abnormality.  No results found.  ____________________________________________    PROCEDURES  Procedure(s) performed:    Procedures    Medications - No data to display   ____________________________________________   INITIAL IMPRESSION / ASSESSMENT AND PLAN / ED COURSE  Pertinent labs & imaging results that were available during my care of the patient were reviewed by me and considered in my medical decision making (see chart for details).  Review of the [place] CSRS was performed in accordance of the [place] prior to dispensing any controlled drugs.    Assessment and Plan: Chronic Neck Pain: Chronic Back Pain:  Patient's diagnosis is consistent with  chronic low back pain and chronic neck pain. Patient was given an injection of Solu-Medrol and norflex in the emergency department. Patient will be discharged home with prescriptions for tapered prednisone. Patient  is to follow up with primary care as needed or otherwise directed. Patient is given ED precautions to return to the ED for any worsening or new symptoms. All patient questions were answered.      ____________________________________________  FINAL CLINICAL IMPRESSION(S) / ED DIAGNOSES  Final diagnoses:  None      NEW MEDICATIONS STARTED DURING THIS VISIT:  New Prescriptions   No medications on file  This chart was dictated using voice recognition software/Dragon. Despite best efforts to proofread, errors can occur which can change the meaning. Any change was purely unintentional.    Lannie Fields, PA-C 09/14/16 1656    Merlyn Lot, MD 09/14/16 1745

## 2016-09-13 NOTE — ED Triage Notes (Signed)
Pt states that she has chronic back pain and the last four days her back has started hurting worse than usual. Pt reports that her primary MD has told her that they cannot do any thing else at this time. Pt is ambulatory to triage with NAD noted at this time.

## 2016-09-16 DIAGNOSIS — S161XXA Strain of muscle, fascia and tendon at neck level, initial encounter: Secondary | ICD-10-CM | POA: Diagnosis not present

## 2016-09-16 DIAGNOSIS — M5441 Lumbago with sciatica, right side: Secondary | ICD-10-CM | POA: Diagnosis not present

## 2016-09-16 DIAGNOSIS — M5442 Lumbago with sciatica, left side: Secondary | ICD-10-CM | POA: Diagnosis not present

## 2016-09-20 ENCOUNTER — Ambulatory Visit (INDEPENDENT_AMBULATORY_CARE_PROVIDER_SITE_OTHER): Payer: Medicare Other

## 2016-09-20 ENCOUNTER — Ambulatory Visit (INDEPENDENT_AMBULATORY_CARE_PROVIDER_SITE_OTHER): Payer: Medicare Other | Admitting: Podiatry

## 2016-09-20 DIAGNOSIS — M722 Plantar fascial fibromatosis: Secondary | ICD-10-CM

## 2016-09-20 DIAGNOSIS — E0842 Diabetes mellitus due to underlying condition with diabetic polyneuropathy: Secondary | ICD-10-CM | POA: Diagnosis not present

## 2016-09-24 NOTE — Progress Notes (Signed)
   Subjective: Patient presents today for sharp, shooting, burning pain and tenderness in the bilateral medial feet, left worse than right, that began 7-8 months ago. She reports associated numbness in her toes. Walking and standing first thing in the morning increase her pain. She has been taking Ibuprofen with minimal relief. Patient presents today for further treatment and evaluation  Objective: Physical Exam General: The patient is alert and oriented x3 in no acute distress.  Dermatology: Skin is warm, dry and supple bilateral lower extremities. Negative for open lesions or macerations bilateral.   Vascular: Dorsalis Pedis and Posterior Tibial pulses palpable bilateral.  Capillary fill time is immediate to all digits.  Neurological: Epicritic and protective threshold intact bilateral.   Musculoskeletal: Tenderness to palpation at the medial calcaneal tubercale and through the insertion of the plantar fascia of the left foot. All other joints range of motion within normal limits bilateral. Strength 5/5 in all groups bilateral.   Radiographic exam:   Normal osseous mineralization. Joint spaces preserved. No fracture/dislocation/boney destruction. Calcaneal spur present with mild thickening of plantar fascia left. No other soft tissue abnormalities or radiopaque foreign bodies.   Assessment: 1. Plantar fasciitis left foot 2. DM with neuropathy  Plan of Care:   1. Patient evaluated. Xrays reviewed.   2. Pt is going to [place], [place] to get diabetic shoes. 3. Continue taking Gabapentin per PCP. 4. Return to clinic when necessary.    Edrick Kins, DPM [facility]  Dr. Edrick Kins, [address]                                        [place], [place] [address]                Office [phone]  Fax [phone]

## 2016-10-15 ENCOUNTER — Ambulatory Visit (INDEPENDENT_AMBULATORY_CARE_PROVIDER_SITE_OTHER): Payer: Medicare Other | Admitting: Family Medicine

## 2016-10-15 ENCOUNTER — Encounter: Payer: Self-pay | Admitting: Family Medicine

## 2016-10-15 VITALS — BP 136/62 | HR 80 | Temp 98.3°F | Resp 16 | Ht 63.0 in | Wt 223.0 lb

## 2016-10-15 DIAGNOSIS — E1169 Type 2 diabetes mellitus with other specified complication: Secondary | ICD-10-CM | POA: Diagnosis not present

## 2016-10-15 DIAGNOSIS — M542 Cervicalgia: Secondary | ICD-10-CM | POA: Diagnosis not present

## 2016-10-15 DIAGNOSIS — G8929 Other chronic pain: Secondary | ICD-10-CM

## 2016-10-15 DIAGNOSIS — Z1231 Encounter for screening mammogram for malignant neoplasm of breast: Secondary | ICD-10-CM | POA: Diagnosis not present

## 2016-10-15 DIAGNOSIS — R635 Abnormal weight gain: Secondary | ICD-10-CM | POA: Diagnosis not present

## 2016-10-15 DIAGNOSIS — F3289 Other specified depressive episodes: Secondary | ICD-10-CM | POA: Diagnosis not present

## 2016-10-15 DIAGNOSIS — Z1239 Encounter for other screening for malignant neoplasm of breast: Secondary | ICD-10-CM

## 2016-10-15 DIAGNOSIS — IMO0002 Reserved for concepts with insufficient information to code with codable children: Secondary | ICD-10-CM

## 2016-10-15 DIAGNOSIS — R946 Abnormal results of thyroid function studies: Secondary | ICD-10-CM | POA: Diagnosis not present

## 2016-10-15 DIAGNOSIS — E1140 Type 2 diabetes mellitus with diabetic neuropathy, unspecified: Secondary | ICD-10-CM | POA: Diagnosis not present

## 2016-10-15 DIAGNOSIS — G4733 Obstructive sleep apnea (adult) (pediatric): Secondary | ICD-10-CM | POA: Diagnosis not present

## 2016-10-15 DIAGNOSIS — E785 Hyperlipidemia, unspecified: Secondary | ICD-10-CM | POA: Diagnosis not present

## 2016-10-15 DIAGNOSIS — Z794 Long term (current) use of insulin: Secondary | ICD-10-CM | POA: Diagnosis not present

## 2016-10-15 DIAGNOSIS — F32A Depression, unspecified: Secondary | ICD-10-CM | POA: Insufficient documentation

## 2016-10-15 DIAGNOSIS — I10 Essential (primary) hypertension: Secondary | ICD-10-CM | POA: Diagnosis not present

## 2016-10-15 DIAGNOSIS — E1165 Type 2 diabetes mellitus with hyperglycemia: Secondary | ICD-10-CM | POA: Diagnosis not present

## 2016-10-15 DIAGNOSIS — M5412 Radiculopathy, cervical region: Secondary | ICD-10-CM | POA: Diagnosis not present

## 2016-10-15 DIAGNOSIS — F329 Major depressive disorder, single episode, unspecified: Secondary | ICD-10-CM | POA: Insufficient documentation

## 2016-10-15 DIAGNOSIS — R7989 Other specified abnormal findings of blood chemistry: Secondary | ICD-10-CM

## 2016-10-15 LAB — TSH: TSH: 3.56 mIU/L

## 2016-10-15 LAB — CBC WITH DIFFERENTIAL/PLATELET
BASOS PCT: 0 %
Basophils Absolute: 0 cells/uL (ref 0–200)
EOS ABS: 249 {cells}/uL (ref 15–500)
Eosinophils Relative: 3 %
HEMATOCRIT: 43.8 % (ref 35.0–45.0)
Hemoglobin: 14.3 g/dL (ref 11.7–15.5)
LYMPHS PCT: 26 %
Lymphs Abs: 2158 cells/uL (ref 850–3900)
MCH: 28 pg (ref 27.0–33.0)
MCHC: 32.6 g/dL (ref 32.0–36.0)
MCV: 85.9 fL (ref 80.0–100.0)
MONO ABS: 498 {cells}/uL (ref 200–950)
MPV: 10 fL (ref 7.5–12.5)
Monocytes Relative: 6 %
NEUTROS ABS: 5395 {cells}/uL (ref 1500–7800)
Neutrophils Relative %: 65 %
Platelets: 251 10*3/uL (ref 140–400)
RBC: 5.1 MIL/uL (ref 3.80–5.10)
RDW: 14.7 % (ref 11.0–15.0)
WBC: 8.3 10*3/uL (ref 3.8–10.8)

## 2016-10-15 LAB — POCT GLYCOSYLATED HEMOGLOBIN (HGB A1C): Hemoglobin A1C: 13.8 — AB (ref ?–5.7)

## 2016-10-15 MED ORDER — SERTRALINE HCL 50 MG PO TABS: 50.0000 mg | ORAL_TABLET | Freq: Every day | ORAL | 3 refills | Status: DC

## 2016-10-15 MED ORDER — BACLOFEN 10 MG PO TABS: 5.0000 mg | ORAL_TABLET | Freq: Three times a day (TID) | ORAL | 1 refills | Status: DC | PRN

## 2016-10-15 MED ORDER — INSULIN GLARGINE 300 UNIT/ML ~~LOC~~ SOPN: 35.0000 [IU] | PEN_INJECTOR | Freq: Every day | SUBCUTANEOUS | 3 refills | Status: DC

## 2016-10-15 NOTE — Progress Notes (Addendum)
Subjective:    Patient ID: Alyssa Perkins, female    DOB: 1950-03-19, 66 y.o.   MRN: [id number]  Alyssa Perkins is a 66 y.o. female presenting on 10/15/2016 for Hypertension and Diabetes   HPI   CHRONIC DM, Type 2: - Last visit with me for DM 07/08/16, with progressive worsening A1c control over past 1 year, had Toujeo increased also she was non adhering to Glipizide. See prior note for details - Today again worsening A1c >13.8%. Attributed to her poor lifestyle and diet, also some questions med non adherence CBGs: Infrequent CBG checking, does not have readings today, recalls often readings >200 Meds: Toujeo 35 units daily (takes mid morning) (she has self titrated but declines increasing dose further), Glipizide BID - Unable to take Metformin due to migraines. Previously tried Lantus insulin Currently on ACEi Lifestyle: - Diet (poor diet, not following low carb and unfamiliar with what carb foods are despite prior counseling, admits eating a lot of cheerios, and sugar free foods, late night foods and stress eating, sometimes erratic eating schedule) - Exercise: (no regular exercise) - Admits some early burning and tingling in feet, complicated by DM neuropathy vs restless leg syndrome Denies hypoglycemia, polyuria, visual changes  Additional history/PMH Depression/Insomnia - request refill Sertraline, had been on this for while but recently few month ago ran out and did not request refill  OSA - Prior history of OSA with prior sleep study [facility] 2-3 years ago, she was diagnosed with OSA but never started on CPAP as she never followed through with CPAP titration sleep study - Complains of symptoms of daytime sleepiness, and fatigue, poor sleep. No obvious witnessed apnea symptoms  Health Maintenance: - Due for mammogram breast cancer screening, no prior abnormal, requesting order  Depression screen [facility] 2/9 10/15/2016 06/07/2016 06/06/2016  Decreased Interest 3 3 3   Down,  Depressed, Hopeless 1 3 3   PHQ - 2 Score 4 6 6   Altered sleeping 1 3 3   Tired, decreased energy 1 3 3   Change in appetite 1 3 3   Feeling bad or failure about yourself  3 3 3   Trouble concentrating 1 3 3   Moving slowly or fidgety/restless 2 0 0  Suicidal thoughts 0 3 1  PHQ-9 Score 13 24 22   Difficult doing work/chores - - Somewhat difficult    Social History  Substance Use Topics  . Smoking status: Former Smoker    Types: Cigarettes    Quit date: 07/17/1994  . Smokeless tobacco: Former Systems developer     Comment: stopped 18 years ago  . Alcohol use No    Review of Systems Per HPI unless specifically indicated above     Objective:    BP 136/62   Pulse 80   Temp 98.3 F (36.8 C) (Oral)   Resp 16   Ht 5' 3"  (1.6 m)   Wt 223 lb (101.2 kg)   BMI 39.50 kg/m   Wt Readings from Last 3 Encounters:  10/15/16 223 lb (101.2 kg)  09/13/16 218 lb (98.9 kg)  09/11/16 220 lb (99.8 kg)    Physical Exam  Constitutional: She is oriented to person, place, and time. She appears well-developed and well-nourished. No distress.  Well-appearing, comfortable, cooperative, obese  HENT:  Head: Normocephalic and atraumatic.  Mouth/Throat: Oropharynx is clear and moist.  Eyes: Conjunctivae are normal. Right eye exhibits no discharge. Left eye exhibits no discharge.  Neck: Normal range of motion. Neck supple. No thyromegaly present.  Cardiovascular: Normal rate, regular rhythm,  normal heart sounds and intact distal pulses.   No murmur heard. Pulmonary/Chest: Effort normal and breath sounds normal. No respiratory distress. She has no wheezes. She has no rales.  Musculoskeletal: She exhibits no edema.  Lymphadenopathy:    She has no cervical adenopathy.  Neurological: She is alert and oriented to person, place, and time.  Skin: Skin is warm and dry. No rash noted. She is not diaphoretic. No erythema.  Psychiatric: She has a normal mood and affect. Her behavior is normal.  Well groomed, good eye  contact, normal speech and thoughts  Nursing note and vitals reviewed.    Recent Labs  03/25/16 1105 07/08/16 1123 10/15/16 1108  HGBA1C 12.8 13.5 13.8*    Results for orders placed or performed in visit on 10/15/16  COMPLETE METABOLIC PANEL WITH GFR  Result Value Ref Range   Sodium 139 135 - 146 mmol/L   Potassium 4.8 3.5 - 5.3 mmol/L   Chloride 103 98 - 110 mmol/L   CO2 24 20 - 31 mmol/L   Glucose, Bld 313 (H) 65 - 99 mg/dL   BUN 19 7 - 25 mg/dL   Creat 0.87 0.50 - 0.99 mg/dL   Total Bilirubin 0.5 0.2 - 1.2 mg/dL   Alkaline Phosphatase 119 33 - 130 U/L   AST 20 10 - 35 U/L   ALT 15 6 - 29 U/L   Total Protein 6.4 6.1 - 8.1 g/dL   Albumin 3.9 3.6 - 5.1 g/dL   Calcium 9.1 8.6 - 10.4 mg/dL   GFR, Est African American 81 >=60 mL/min   GFR, Est Non African American 70 >=60 mL/min  Lipid panel  Result Value Ref Range   Cholesterol 150 <200 mg/dL   Triglycerides 257 (H) <150 mg/dL   HDL 37 (L) >50 mg/dL   Total CHOL/HDL Ratio 4.1 <5.0 Ratio   VLDL 51 (H) <30 mg/dL   LDL Cholesterol 62 <100 mg/dL  CBC with Differential/Platelet  Result Value Ref Range   WBC 8.3 3.8 - 10.8 K/uL   RBC 5.10 3.80 - 5.10 MIL/uL   Hemoglobin 14.3 11.7 - 15.5 g/dL   HCT 43.8 35.0 - 45.0 %   MCV 85.9 80.0 - 100.0 fL   MCH 28.0 27.0 - 33.0 pg   MCHC 32.6 32.0 - 36.0 g/dL   RDW 14.7 11.0 - 15.0 %   Platelets 251 140 - 400 K/uL   MPV 10.0 7.5 - 12.5 fL   Neutro Abs 5,395 1,500 - 7,800 cells/uL   Lymphs Abs 2,158 850 - 3,900 cells/uL   Monocytes Absolute 498 200 - 950 cells/uL   Eosinophils Absolute 249 15 - 500 cells/uL   Basophils Absolute 0 0 - 200 cells/uL   Neutrophils Relative % 65 %   Lymphocytes Relative 26 %   Monocytes Relative 6 %   Eosinophils Relative 3 %   Basophils Relative 0 %   Smear Review Criteria for review not met   TSH  Result Value Ref Range   TSH 3.56 mIU/L  POCT HgB A1C  Result Value Ref Range   Hemoglobin A1C 13.8 (A) 5.7      Assessment & Plan:   Problem  List Items Addressed This Visit    OSA (obstructive sleep apnea)    Last sleep study [facility] Severe OSA 10/2016 per [facility] - Next step for CPAP titration      Hypertension    Slightly improved BP on manual re-check No change to treatment today Did not adhere to  BP log at home Check chemistry today Follow-up      Relevant Orders   COMPLETE METABOLIC PANEL WITH GFR (Completed)   CBC with Differential/Platelet (Completed)   Hyperlipidemia associated with type 2 diabetes mellitus ([place])    Due for repeat lipids Prior abnormal High risk with uncontrolled DM      Relevant Medications   Insulin Glargine (TOUJEO SOLOSTAR) 300 UNIT/ML SOPN   Other Relevant Orders   Lipid panel (Completed)   DM (diabetes mellitus), type 2, uncontrolled w/neurologic complication ([place]) - Primary    Still significant worsening DM control A1c now 13.8%, has been completely uncontrolled for >6 months, similar to last time variety of reasons non adherence to insulin titration, delayed received med from pharmacy, poor lifestyle no DM diet and no regular exercise - Complication with peripheral neuropathy  Plan: 1. Last visit she was referred to [facility] Endocrinology but she never followed up with them to establish, she does not recall. Regardless, due to financial concerns, agree to consider switch referral now today to [facility] Endocrinology she is okay with travel, and may benefit from financial aid / research opportunities for medications - need Endocrinology assistance with uncontrolled DM despite medication adjustments 2. Continue Toujeo for now 35 u advised would likely need to continue titrate up, given fasting sugar still elevated but again do not have any CBG results difficult to titrate med 3. Continue Glipizide 4. Did not bring glucose log today 5. Improve regular diet schedule and diet options with low carb changes, her diet is non adherent 6. Continue Gabapentin for now - will need to adjust in future if not  helping 7. Follow-up with [facility] Endocrine for A1c management, if needed can return here for neuropathy or other related issues      Relevant Medications   Insulin Glargine (TOUJEO SOLOSTAR) 300 UNIT/ML SOPN   Other Relevant Orders   POCT HgB A1C (Completed)   COMPLETE METABOLIC PANEL WITH GFR (Completed)   Ambulatory referral to Endocrinology   Depression    Off Sertraline, uncertain why ran out, non adherent with notifying office that she ran out of med Refilled today      Relevant Medications   sertraline (ZOLOFT) 50 MG tablet   Chronic neck pain   Relevant Medications   sertraline (ZOLOFT) 50 MG tablet   baclofen (LIORESAL) 10 MG tablet   Cervical radiculopathy    Refilled Baclofen for chronic neck and back pain      Relevant Medications   sertraline (ZOLOFT) 50 MG tablet   baclofen (LIORESAL) 10 MG tablet    Other Visit Diagnoses    Screening for breast cancer       Relevant Orders   MM DIGITAL SCREENING BILATERAL   Abnormal weight gain       Relevant Orders   TSH (Completed)   Elevated TSH       Relevant Orders   TSH (Completed)      Meds ordered this encounter  Medications  . Insulin Glargine (TOUJEO SOLOSTAR) 300 UNIT/ML SOPN    Sig: Inject 35 Units into the skin daily.    Dispense:  7.5 mL    Refill:  3    Please dispense quantity sufficient for 90 day supply with 3 refills  . sertraline (ZOLOFT) 50 MG tablet    Sig: Take 1 tablet (50 mg total) by mouth daily.    Dispense:  90 tablet    Refill:  3  . baclofen (LIORESAL) 10 MG tablet  Sig: Take 0.5-1 tablets (5-10 mg total) by mouth 3 (three) times daily as needed for muscle spasms.    Dispense:  30 each    Refill:  1    Follow up plan: Return in about 3 months (around 01/15/2017) for HTN, Depression, med follow-up.  Nobie Putnam, [facility] 11/05/2016, 12:16 PM

## 2016-10-15 NOTE — Assessment & Plan Note (Signed)
Off Sertraline, uncertain why ran out, non adherent with notifying office that she ran out of med Refilled today

## 2016-10-15 NOTE — Assessment & Plan Note (Signed)
Refilled Baclofen for chronic neck and back pain

## 2016-10-15 NOTE — Assessment & Plan Note (Signed)
Due for repeat lipids Prior abnormal High risk with uncontrolled DM

## 2016-10-15 NOTE — Assessment & Plan Note (Signed)
Slightly improved BP on manual re-check No change to treatment today Did not adhere to BP log at home Check chemistry today Follow-up

## 2016-10-15 NOTE — Assessment & Plan Note (Signed)
Still significant worsening DM control A1c now 13.8%, has been completely uncontrolled for >6 months, similar to last time variety of reasons non adherence to insulin titration, delayed received med from pharmacy, poor lifestyle no DM diet and no regular exercise - Complication with peripheral neuropathy  Plan: 1. Last visit she was referred to [facility] Endocrinology but she never followed up with them to establish, she does not recall. Regardless, due to financial concerns, agree to consider switch referral now today to [facility] Endocrinology she is okay with travel, and may benefit from financial aid / research opportunities for medications - need Endocrinology assistance with uncontrolled DM despite medication adjustments 2. Continue Toujeo for now 35 u advised would likely need to continue titrate up, given fasting sugar still elevated but again do not have any CBG results difficult to titrate med 3. Continue Glipizide 4. Did not bring glucose log today 5. Improve regular diet schedule and diet options with low carb changes, her diet is non adherent 6. Continue Gabapentin for now - will need to adjust in future if not helping 7. Follow-up with [facility] Endocrine for A1c management, if needed can return here for neuropathy or other related issues

## 2016-10-15 NOTE — Patient Instructions (Addendum)
Thank you for coming to the clinic today.  1.  Refilled Toujeo up to 35units  May need more and other medicines in future, now concern with uncontrolled A1c referring you to [facility] Diabetes specialist  [facility]  Medical clinic in [place], [place]  Address: [address] [place], [place] [address]  Phone: [phone]  Consider asking about any financial aid or research opportunities to get discount or free medications.  For Mammogram screening for breast cancer   Call the [place] below anytime to schedule your own appointment now that order has been placed.  [facility] [place], [place] [address] Phone: [phone]  DUE for FASTING BLOOD WORK (no food or drink after midnight before the lab appointment, only water or coffee without cream/sugar on the morning of)  TODAY  For Lab Results, once available within 2-3 days of blood draw, you can can log in to MyChart online to view your results and a brief explanation. Also, we can discuss results at next follow-up visit.   Please schedule a Follow-up Appointment to: Return in about 3 months (around 01/15/2017) for HTN, Depression, med follow-up.  If you have any other questions or concerns, please feel free to call the clinic or send a message through [place]. You may also schedule an earlier appointment if necessary.  Additionally, you may be receiving a survey about your experience at our clinic within a few days to 1 week by e-mail or mail. We value your feedback.  Nobie Putnam, DO [place]

## 2016-10-16 LAB — LIPID PANEL
CHOL/HDL RATIO: 4.1 ratio (ref <5.0)
CHOLESTEROL: 150 mg/dL (ref <200)
HDL: 37 mg/dL — ABNORMAL LOW (ref >50)
LDL Cholesterol: 62 mg/dL (ref <100)
TRIGLYCERIDES: 257 mg/dL — AB (ref <150)
VLDL: 51 mg/dL — ABNORMAL HIGH (ref <30)

## 2016-10-16 LAB — COMPLETE METABOLIC PANEL WITH GFR
ALT: 15 U/L (ref 6–29)
AST: 20 U/L (ref 10–35)
Albumin: 3.9 g/dL (ref 3.6–5.1)
Alkaline Phosphatase: 119 U/L (ref 33–130)
BUN: 19 mg/dL (ref 7–25)
CHLORIDE: 103 mmol/L (ref 98–110)
CO2: 24 mmol/L (ref 20–31)
Calcium: 9.1 mg/dL (ref 8.6–10.4)
Creat: 0.87 mg/dL (ref 0.50–0.99)
GFR, EST AFRICAN AMERICAN: 81 mL/min (ref >=60)
GFR, Est Non African American: 70 mL/min (ref >=60)
Glucose, Bld: 313 mg/dL — ABNORMAL HIGH (ref 65–99)
POTASSIUM: 4.8 mmol/L (ref 3.5–5.3)
Sodium: 139 mmol/L (ref 135–146)
Total Bilirubin: 0.5 mg/dL (ref 0.2–1.2)
Total Protein: 6.4 g/dL (ref 6.1–8.1)

## 2016-10-17 ENCOUNTER — Ambulatory Visit: Payer: Medicare Other | Attending: Family Medicine

## 2016-10-17 DIAGNOSIS — G4761 Periodic limb movement disorder: Secondary | ICD-10-CM | POA: Diagnosis not present

## 2016-10-17 DIAGNOSIS — G4733 Obstructive sleep apnea (adult) (pediatric): Secondary | ICD-10-CM | POA: Diagnosis not present

## 2016-10-17 DIAGNOSIS — F5101 Primary insomnia: Secondary | ICD-10-CM | POA: Insufficient documentation

## 2016-10-21 ENCOUNTER — Encounter: Payer: Self-pay | Admitting: Family Medicine

## 2016-10-21 DIAGNOSIS — G4733 Obstructive sleep apnea (adult) (pediatric): Secondary | ICD-10-CM | POA: Insufficient documentation

## 2016-10-21 DIAGNOSIS — Z9989 Dependence on other enabling machines and devices: Secondary | ICD-10-CM

## 2016-11-05 NOTE — Assessment & Plan Note (Signed)
Last sleep study PSG Severe OSA 10/2016 per [facility] - Next step for CPAP titration

## 2016-11-06 ENCOUNTER — Telehealth: Payer: Self-pay | Admitting: Family Medicine

## 2016-11-06 NOTE — Telephone Encounter (Signed)
This is the second notice on this request. They received the office note from 07/08/16 last time, that does not mention CPAP details. However, the most recent office note is from 10/15/16 that does discuss CPAP in detail and provide the information that they should need. This should have been printed and faxed already as it is no longer in my outbox.  Could you re-fax the office note from 10/15/16 to ensure that they receive it? Thanks.  Nobie Putnam, [facility] 11/06/2016, 5:41 PM

## 2016-11-06 NOTE — Telephone Encounter (Signed)
Alyssa Perkins said pt needs a new Cpap machine and they need office notes documenting pt's OSA symptoms faxed to [phone].  The notes they have do not discuss the OSA.  Michelle's call back number is [phone]

## 2016-11-07 NOTE — Telephone Encounter (Signed)
LM for [place].

## 2016-11-19 DIAGNOSIS — Z96652 Presence of left artificial knee joint: Secondary | ICD-10-CM | POA: Diagnosis not present

## 2016-12-05 ENCOUNTER — Telehealth: Payer: Self-pay | Admitting: Family Medicine

## 2016-12-05 DIAGNOSIS — G8929 Other chronic pain: Secondary | ICD-10-CM

## 2016-12-05 DIAGNOSIS — F3289 Other specified depressive episodes: Secondary | ICD-10-CM

## 2016-12-05 DIAGNOSIS — Z794 Long term (current) use of insulin: Secondary | ICD-10-CM

## 2016-12-05 DIAGNOSIS — I10 Essential (primary) hypertension: Secondary | ICD-10-CM

## 2016-12-05 DIAGNOSIS — E1165 Type 2 diabetes mellitus with hyperglycemia: Secondary | ICD-10-CM

## 2016-12-05 DIAGNOSIS — M5412 Radiculopathy, cervical region: Secondary | ICD-10-CM

## 2016-12-05 DIAGNOSIS — IMO0002 Reserved for concepts with insufficient information to code with codable children: Secondary | ICD-10-CM

## 2016-12-05 DIAGNOSIS — M542 Cervicalgia: Secondary | ICD-10-CM

## 2016-12-05 DIAGNOSIS — E1140 Type 2 diabetes mellitus with diabetic neuropathy, unspecified: Secondary | ICD-10-CM

## 2016-12-05 DIAGNOSIS — E1142 Type 2 diabetes mellitus with diabetic polyneuropathy: Secondary | ICD-10-CM

## 2016-12-05 DIAGNOSIS — K219 Gastro-esophageal reflux disease without esophagitis: Secondary | ICD-10-CM

## 2016-12-05 MED ORDER — SERTRALINE HCL 50 MG PO TABS: 50.0000 mg | ORAL_TABLET | Freq: Every day | ORAL | 3 refills | Status: DC

## 2016-12-05 MED ORDER — OMEPRAZOLE 20 MG PO CPDR: 20.0000 mg | DELAYED_RELEASE_CAPSULE | Freq: Every day | ORAL | 2 refills | Status: DC

## 2016-12-05 MED ORDER — OMEPRAZOLE 20 MG PO CPDR: 20.0000 mg | DELAYED_RELEASE_CAPSULE | Freq: Every day | ORAL | 3 refills | Status: DC

## 2016-12-05 MED ORDER — INSULIN GLARGINE 300 UNIT/ML ~~LOC~~ SOPN: 35.0000 [IU] | PEN_INJECTOR | Freq: Every day | SUBCUTANEOUS | 0 refills | Status: DC

## 2016-12-05 MED ORDER — SIMVASTATIN 40 MG PO TABS: 40.0000 mg | ORAL_TABLET | Freq: Every day | ORAL | 3 refills | Status: DC

## 2016-12-05 MED ORDER — GLIPIZIDE 5 MG PO TABS: 5.0000 mg | ORAL_TABLET | Freq: Two times a day (BID) | ORAL | 3 refills | Status: DC

## 2016-12-05 MED ORDER — QUINAPRIL HCL 20 MG PO TABS: 20.0000 mg | ORAL_TABLET | Freq: Every day | ORAL | 3 refills | Status: DC

## 2016-12-05 MED ORDER — HYDROXYZINE HCL 25 MG PO TABS: 25.0000 mg | ORAL_TABLET | Freq: Every day | ORAL | 1 refills | Status: DC | PRN

## 2016-12-05 NOTE — Telephone Encounter (Signed)
Please call prescriptions to [facility] - except gabapentin she has plenty of that.  Please call hydroxyzine to [facility].  Her call back number is [phone]

## 2016-12-05 NOTE — Telephone Encounter (Signed)
Refilled meds. Except baclofen as PRN. Also only filled 90 day supply Toujeo, as she should have established now with [facility] End[facility] for Diabetes. I am no longer planning to manage this further.  Nobie Putnam, [facility] 12/05/2016, 5:11 PM

## 2016-12-12 ENCOUNTER — Telehealth: Payer: Self-pay

## 2016-12-12 NOTE — Telephone Encounter (Signed)
Called patient about endocrinologist referral and they had tried calling her multiple time and unable to contact, as per patient her cell phone number was not working. The number for [facility] endocrinology were given to the pt [phone] as per pt she will call and schedule appointment with them.

## 2016-12-20 DIAGNOSIS — Z471 Aftercare following joint replacement surgery: Secondary | ICD-10-CM | POA: Diagnosis not present

## 2016-12-20 DIAGNOSIS — M25562 Pain in left knee: Secondary | ICD-10-CM | POA: Diagnosis not present

## 2016-12-20 DIAGNOSIS — Z96652 Presence of left artificial knee joint: Secondary | ICD-10-CM | POA: Diagnosis not present

## 2017-01-15 ENCOUNTER — Encounter: Payer: Self-pay | Admitting: Family Medicine

## 2017-01-15 ENCOUNTER — Ambulatory Visit (INDEPENDENT_AMBULATORY_CARE_PROVIDER_SITE_OTHER): Payer: Medicare Other | Admitting: Family Medicine

## 2017-01-15 VITALS — BP 138/61 | HR 76 | Temp 98.5°F | Resp 16 | Ht 63.0 in | Wt 220.6 lb

## 2017-01-15 DIAGNOSIS — Z6839 Body mass index (BMI) 39.0-39.9, adult: Secondary | ICD-10-CM | POA: Diagnosis not present

## 2017-01-15 DIAGNOSIS — Z1231 Encounter for screening mammogram for malignant neoplasm of breast: Secondary | ICD-10-CM | POA: Diagnosis not present

## 2017-01-15 DIAGNOSIS — E1149 Type 2 diabetes mellitus with other diabetic neurological complication: Secondary | ICD-10-CM | POA: Diagnosis not present

## 2017-01-15 DIAGNOSIS — M545 Low back pain: Secondary | ICD-10-CM | POA: Diagnosis not present

## 2017-01-15 DIAGNOSIS — G4733 Obstructive sleep apnea (adult) (pediatric): Secondary | ICD-10-CM | POA: Diagnosis not present

## 2017-01-15 DIAGNOSIS — I10 Essential (primary) hypertension: Secondary | ICD-10-CM | POA: Diagnosis not present

## 2017-01-15 DIAGNOSIS — R519 Headache, unspecified: Secondary | ICD-10-CM

## 2017-01-15 DIAGNOSIS — E1140 Type 2 diabetes mellitus with diabetic neuropathy, unspecified: Secondary | ICD-10-CM | POA: Diagnosis not present

## 2017-01-15 DIAGNOSIS — M79673 Pain in unspecified foot: Secondary | ICD-10-CM | POA: Diagnosis not present

## 2017-01-15 DIAGNOSIS — E11319 Type 2 diabetes mellitus with unspecified diabetic retinopathy without macular edema: Secondary | ICD-10-CM | POA: Diagnosis not present

## 2017-01-15 DIAGNOSIS — Z23 Encounter for immunization: Secondary | ICD-10-CM

## 2017-01-15 DIAGNOSIS — E1165 Type 2 diabetes mellitus with hyperglycemia: Secondary | ICD-10-CM

## 2017-01-15 DIAGNOSIS — R51 Headache: Secondary | ICD-10-CM

## 2017-01-15 DIAGNOSIS — F331 Major depressive disorder, recurrent, moderate: Secondary | ICD-10-CM | POA: Diagnosis not present

## 2017-01-15 DIAGNOSIS — G8929 Other chronic pain: Secondary | ICD-10-CM

## 2017-01-15 DIAGNOSIS — Z9989 Dependence on other enabling machines and devices: Secondary | ICD-10-CM

## 2017-01-15 DIAGNOSIS — Z1239 Encounter for other screening for malignant neoplasm of breast: Secondary | ICD-10-CM

## 2017-01-15 DIAGNOSIS — IMO0002 Reserved for concepts with insufficient information to code with codable children: Secondary | ICD-10-CM

## 2017-01-15 DIAGNOSIS — E669 Obesity, unspecified: Secondary | ICD-10-CM | POA: Diagnosis not present

## 2017-01-15 DIAGNOSIS — E785 Hyperlipidemia, unspecified: Secondary | ICD-10-CM | POA: Diagnosis not present

## 2017-01-15 DIAGNOSIS — E1142 Type 2 diabetes mellitus with diabetic polyneuropathy: Secondary | ICD-10-CM | POA: Diagnosis not present

## 2017-01-15 DIAGNOSIS — Z794 Long term (current) use of insulin: Secondary | ICD-10-CM | POA: Diagnosis not present

## 2017-01-15 DIAGNOSIS — K219 Gastro-esophageal reflux disease without esophagitis: Secondary | ICD-10-CM | POA: Diagnosis not present

## 2017-01-15 MED ORDER — TRAMADOL HCL 50 MG PO TABS: 50.0000 mg | ORAL_TABLET | Freq: Three times a day (TID) | ORAL | 0 refills | Status: DC | PRN

## 2017-01-15 MED ORDER — TRAZODONE HCL 50 MG PO TABS: 50.0000 mg | ORAL_TABLET | Freq: Every day | ORAL | 1 refills | Status: DC

## 2017-01-15 NOTE — Progress Notes (Signed)
Subjective:    Patient ID: Alyssa Perkins, female    DOB: 04-21-1950, 66 y.o.   MRN: [id number]  [name] is a 66 y.o. female presenting on 01/15/2017 for Hypertension   HPI   CHRONIC DM, Type 2: - Last visit with me 10/15/16, for same problem, treated with continued Toujeo 35u given titrate up advise, on Glipizide, and referral to [facility] Endocrinology due to difficulty controlling A1c, see prior notes for background information. - Interval update with now patient established today, earlier this morning at [facility] Endocrinology with Dr Joneen Caraway, will review full office visit report when available in [place], she was increased on Toujeo and given titrate up instructions and discontinued off of Glipizide. They will see her in follow-up within 6 weeks by PharmD for discussion and start GLP1 agent - Today patient reports no new concerns. - She is due for DM eye exam as well - They recommended DM Nutrition education class, and she requested to go to [facility] locally instead of [place] - History of neuropathy with some tingling and burning in feet Denies hypoglycemia, polyuria, visual changes, numbness.  Major Depression / Anxiety / Insomnia - Chronic problem, not discussed previously before, has been on Sertraline regularly did run out recently but this was refilled, now she feels like this is not helping her, see scores PHQ and GAD below. Problem with several people living at home she cannot get quiet time, difficulty with mood and anxiety and other life stressors. Would like to adjust medication - Also regarding sleep, it has still been poor recently see below on OSA, problems with CPAP - Denies suicidal or homicidal ideation  Severe OSA on CPAP - Last visit with me 10/15/16, reviewed this, she had prior initial PSG dx Severe OSA from [facility] and was only missing CPAP titration, see prior notes for background information. - Interval update with completed CPAP titration and now  has CPAP machine, but problems with adhering to it due to discomfort with nasal prongs and then switch to "nasal pillow" - Today patient reports that she is trying to use it nightly but is not tolerating it only wearing for half of most nights, will wake up "suffocating" and thinks the nasal device is not working right for her or causing discomfort. - She is requesting rx for full face mask sent to [place], she cannot get new one from supplier  Headaches / Chronic Pain / OA/DJD - Reports a chronic history of pain, see prior chart for background information - Recent complaint with refractory headaches, not improved with NSAIDs high dose ibuprofen, tried Tylenol but not taking regularly. Prior use of Baclofen limited relief, out of this now, not interested in refill, has Gabapentin for neuropathy nd back pain but limited relief for headache she was given Tramadol in past with relief - headache on R side, seems worse with poor sleep - Also chronic joint knee and back pain  Health Maintenance: - Due for Flu Shot, will receive today  - Last Mammogram 2014, due for repeat, prior negative. Requesting order for [facility]  Depression screen [facility] 2/9 01/15/2017 10/15/2016 06/07/2016  Decreased Interest 3 3 3   Down, Depressed, Hopeless 2 1 3   PHQ - 2 Score 5 4 6   Altered sleeping 3 1 3   Tired, decreased energy 3 1 3   Change in appetite 2 1 3   Feeling bad or failure about yourself  1 3 3   Trouble concentrating 3 1 3   Moving slowly or fidgety/restless  2 2 0  Suicidal thoughts 0 0 3  PHQ-9 Score 19 13 24   Difficult doing work/chores Not difficult at all - -   GAD 7 : Generalized Anxiety Score 01/15/2017  Nervous, Anxious, on Edge 2  Control/stop worrying 3  Worry too much - different things 3  Trouble relaxing 2  Restless 2  Easily annoyed or irritable 3  Afraid - awful might happen 2  Total GAD 7 Score 17  Anxiety Difficulty Somewhat difficult    Social History  Substance Use Topics    . Smoking status: Former Smoker    Types: Cigarettes    Quit date: 07/17/1994  . Smokeless tobacco: Former Systems developer     Comment: stopped 18 years ago  . Alcohol use No    Review of Systems Per HPI unless specifically indicated above     Objective:    BP 138/61   Pulse 76   Temp 98.5 F (36.9 C) (Oral)   Resp 16   Ht 5' 3"  (1.6 m)   Wt 220 lb 9.6 oz (100.1 kg)   BMI 39.08 kg/m   Wt Readings from Last 3 Encounters:  01/15/17 220 lb 9.6 oz (100.1 kg)  10/15/16 223 lb (101.2 kg)  09/13/16 218 lb (98.9 kg)    Physical Exam  Constitutional: She is oriented to person, place, and time. She appears well-developed and well-nourished. No distress.  Well-appearing, slightly uncomfortable with headache, cooperative, obese  HENT:  Head: Normocephalic and atraumatic.  Mouth/Throat: Oropharynx is clear and moist.  Eyes: Conjunctivae are normal. Right eye exhibits no discharge. Left eye exhibits no discharge.  Neck: Normal range of motion. Neck supple. No thyromegaly present.  Cardiovascular: Normal rate, regular rhythm, normal heart sounds and intact distal pulses.   No murmur heard. Pulmonary/Chest: Effort normal and breath sounds normal. No respiratory distress. She has no wheezes. She has no rales.  Musculoskeletal: Normal range of motion. She exhibits no edema.  Lymphadenopathy:    She has no cervical adenopathy.  Neurological: She is alert and oriented to person, place, and time.  Skin: Skin is warm and dry. No rash noted. She is not diaphoretic. No erythema.  Psychiatric: She has a normal mood and affect. Her behavior is normal.  Well groomed, good eye contact, normal speech and thoughts  Nursing note and vitals reviewed.  Diabetic Foot Exam - Simple   Simple Foot Form Diabetic Foot exam was performed with the following findings:  Yes 01/15/2017 11:00 AM  Visual Inspection No deformities, no ulcerations, no other skin breakdown bilaterally:  Yes Sensation Testing See comments:   Yes Pulse Check Posterior Tibialis and Dorsalis pulse intact bilaterally:  Yes Comments Mild reduced sensation to monofilament bilateral     Results for orders placed or performed in visit on 10/15/16  COMPLETE METABOLIC PANEL WITH GFR  Result Value Ref Range   Sodium 139 135 - 146 mmol/L   Potassium 4.8 3.5 - 5.3 mmol/L   Chloride 103 98 - 110 mmol/L   CO2 24 20 - 31 mmol/L   Glucose, Bld 313 (H) 65 - 99 mg/dL   BUN 19 7 - 25 mg/dL   Creat 0.87 0.50 - 0.99 mg/dL   Total Bilirubin 0.5 0.2 - 1.2 mg/dL   Alkaline Phosphatase 119 33 - 130 U/L   AST 20 10 - 35 U/L   ALT 15 6 - 29 U/L   Total Protein 6.4 6.1 - 8.1 g/dL   Albumin 3.9 3.6 - 5.1 g/dL  Calcium 9.1 8.6 - 10.4 mg/dL   GFR, Est African American 81 >=60 mL/min   GFR, Est Non African American 70 >=60 mL/min  Lipid panel  Result Value Ref Range   Cholesterol 150 <200 mg/dL   Triglycerides 257 (H) <150 mg/dL   HDL 37 (L) >50 mg/dL   Total CHOL/HDL Ratio 4.1 <5.0 Ratio   VLDL 51 (H) <30 mg/dL   LDL Cholesterol 62 <100 mg/dL  CBC with Differential/Platelet  Result Value Ref Range   WBC 8.3 3.8 - 10.8 K/uL   RBC 5.10 3.80 - 5.10 MIL/uL   Hemoglobin 14.3 11.7 - 15.5 g/dL   HCT 43.8 35.0 - 45.0 %   MCV 85.9 80.0 - 100.0 fL   MCH 28.0 27.0 - 33.0 pg   MCHC 32.6 32.0 - 36.0 g/dL   RDW 14.7 11.0 - 15.0 %   Platelets 251 140 - 400 K/uL   MPV 10.0 7.5 - 12.5 fL   Neutro Abs 5,395 1,500 - 7,800 cells/uL   Lymphs Abs 2,158 850 - 3,900 cells/uL   Monocytes Absolute 498 200 - 950 cells/uL   Eosinophils Absolute 249 15 - 500 cells/uL   Basophils Absolute 0 0 - 200 cells/uL   Neutrophils Relative % 65 %   Lymphocytes Relative 26 %   Monocytes Relative 6 %   Eosinophils Relative 3 %   Basophils Relative 0 %   Smear Review Criteria for review not met   TSH  Result Value Ref Range   TSH 3.56 mIU/L  POCT HgB A1C  Result Value Ref Range   Hemoglobin A1C 13.8 (A) 5.7      Assessment & Plan:   Problem List Items  Addressed This Visit    Chronic back pain    Trial on Tramadol short course for refractory headaches and joint pain - Reviewed only limited rx will need to return to discuss this in future - DC'd baclofen, caution on chronic NSAID      Relevant Medications   traMADol (ULTRAM) 50 MG tablet   Depression - Primary    Concern with chronic MDD recurrent, comorbid anxiety, stressors, and secondary insomnia, also worse with CPAP and chronic pain.  Plan: 1. Sertraline 66m seems ineffective - will likely titrate up in future, but for now add new Trazodone 54mtabs nightly for 2-4 weeks for mood and insomnia help initiate sleep, and may titrate up to 10096mf needed in future 2. Return 4 weeks review mood and med adjust likely titrate up Zoloft vs consider SNRI for pain as well      Relevant Medications   traZODone (DESYREL) 50 MG tablet   DM (diabetes mellitus), type 2, uncontrolled w/ne[place]rologic complication [place]  [facility] - Adjusted Toujeo, continue titrate up - Remain off Glipizide (discontinued today) - Follow-up with them PharmD in 6 weeks for med adjust, likely add GLP1 - Return to see Endocrine 3 months - Today referral sent by me to [facility] local DM nutrition      Relevant Orders   Ambulatory referral to diabetic education   OSA on CPAP    Suboptimally controlled severe OSA on CPAP now, problems adhering to CPAP nasal devices - Tries to adhere nightly, only tolerating half of night - Needs full face mask but states cannot get through her supplier [facility] - [facility]  Other Visit Diagnoses    Needs flu shot       Relevant Orders   Flu vaccine HIGH DOSE PF (Completed)   Chronic nonintractable headache, unspecified headache type       Trial on Tramadol for short term relief, has helped before, headache is refractory, seems more related to  chronic pain and poor sleep rather than migraine   Relevant Medications   traZODone (DESYREL) 50 MG tablet   traMADol (ULTRAM) 50 MG tablet   Screening for breast cancer       Relevant Orders   MM DIGITAL SCREENING BILATERAL      Meds ordered this encounter  Medications  .       . traZODone (DESYREL) 50 MG tablet    Sig: Take 1-2 tablets (50-100 mg total) by mouth at bedtime. Start 1 tab and if not helping by 2 to 4 weeks you can increase to 2 pills bedtime    Dispense:  90 tablet    Refill:  1  .               . traMADol (ULTRAM) 50 MG tablet    Sig: Take 1-2 tablets (50-100 mg total) by mouth every 8 (eight) hours as needed.    Dispense:  30 tablet    Refill:  0    Follow up plan: Return in about 4 weeks (around 02/12/2017) for Headaches, Chronic Pain med adjust, Insomnia / Mood/Anxiety PHQ/GAD.  Nobie Putnam, [facility] 01/15/2017, 1:24 PM

## 2017-01-15 NOTE — Assessment & Plan Note (Signed)
Followed by [facility] Endocrinology as of today 10/31 - Adjusted Toujeo, continue titrate up - Remain off Glipizide (discontinued today) - Follow-up with them PharmD in 6 weeks for med adjust, likely add GLP1 - Return to see Endocrine 3 months - Today referral sent by me to [place] for local DM nutrition

## 2017-01-15 NOTE — Assessment & Plan Note (Addendum)
Concern with chronic MDD recurrent, comorbid anxiety, stressors, and secondary insomnia, also worse with CPAP and chronic pain.  Plan: 1. Sertraline 50mg  seems ineffective - will likely titrate up in future, but for now add new Trazodone 50mg  tabs nightly for 2-4 weeks for mood and insomnia help initiate sleep, and may titrate up to 100mg  if needed in future 2. Return 4 weeks review mood and med adjust likely titrate up Zoloft vs consider SNRI for pain as well

## 2017-01-15 NOTE — Patient Instructions (Addendum)
Thank you for coming to the clinic today.  1. New rx Trazodone sleep med ordered, take one at bedtime, if not helping after 2-4 weeks can increase to 2 pills  Will fax order for CPAP facemask to [place] screening for breast cancer   Call the [place] below anytime to schedule your own appointment now that order has been placed.  [facility] [place], [place] [address] Phone: [phone]  Try Tramadol as needed for pain this is only temporary supply for now.  Recommend to start taking Tylenol Extra Strength 500mg  tabs - take 1 to 2 tabs per dose (max 1000mg ) every 6-8 hours for pain (take regularly, don't skip a dose for next 7 days), max 24 hour daily dose is 6 tablets or 3000mg . In the future you can repeat the same everyday Tylenol course for 1-2 weeks at a time.   May try Excedrin Migraine as needed  NUTRITION  [facility]   Address: [address] [place], [place], [place] [address] Phone: [phone]  Please schedule a Follow-up Appointment to: Return in about 4 weeks (around 02/12/2017) for Headaches, Chronic Pain med adjust, Insomnia / Mood/Anxiety PHQ/GAD.  If you have any other questions or concerns, please feel free to call the clinic or send a message through [place]. You may also schedule an earlier appointment if necessary.  Additionally, you may be receiving a survey about your experience at our clinic within a few days to 1 week by e-mail or mail. We value your feedback.  Nobie Putnam, DO [place]

## 2017-01-15 NOTE — Assessment & Plan Note (Signed)
Suboptimally controlled severe OSA on CPAP now, problems adhering to CPAP nasal devices - Tries to adhere nightly, only tolerating half of night - Needs full face mask but states cannot get through her supplier [facility] - Request order sent to [place], written rx for Full Face Mask to be faxed to [facility]

## 2017-01-15 NOTE — Assessment & Plan Note (Signed)
Trial on Tramadol short course for refractory headaches and joint pain - Reviewed only limited rx will need to return to discuss this in future - DC'd baclofen, caution on chronic NSAID

## 2017-01-28 ENCOUNTER — Ambulatory Visit: Admitting: *Deleted

## 2017-02-03 ENCOUNTER — Encounter: Payer: Medicare Other | Attending: Family Medicine | Admitting: *Deleted

## 2017-02-03 ENCOUNTER — Encounter: Payer: Self-pay | Admitting: *Deleted

## 2017-02-03 VITALS — BP 124/78 | Ht 62.5 in | Wt 222.1 lb

## 2017-02-03 DIAGNOSIS — Z713 Dietary counseling and surveillance: Secondary | ICD-10-CM | POA: Diagnosis not present

## 2017-02-03 DIAGNOSIS — E1149 Type 2 diabetes mellitus with other diabetic neurological complication: Secondary | ICD-10-CM | POA: Diagnosis not present

## 2017-02-03 DIAGNOSIS — Z794 Long term (current) use of insulin: Secondary | ICD-10-CM

## 2017-02-03 DIAGNOSIS — E1165 Type 2 diabetes mellitus with hyperglycemia: Secondary | ICD-10-CM | POA: Diagnosis not present

## 2017-02-03 NOTE — Progress Notes (Signed)
Diabetes Self-Management Education  Visit Type: First/Initial  Appt. Start Time: 1115 Appt. End Time: 1215  02/03/2017  Alyssa Perkins, identified by name and date of birth, is a 66 y.o. female with a diagnosis of Diabetes: Type 2.   ASSESSMENT  Blood pressure 124/78, height 5' 2.5" (1.588 m), weight 222 lb 1.6 oz (100.7 kg). Body mass index is 39.98 kg/m.  Diabetes Self-Management Education - 02/03/17 1252      Visit Information   Visit Type  First/Initial      Initial Visit   Diabetes Type  Type 2    Are you currently following a meal plan?  No    Are you taking your medications as prescribed?  Yes    Date Diagnosed  2005      Health Coping   How would you rate your overall health?  Fair      Psychosocial Assessment   Patient Belief/Attitude about Diabetes  Defeat/Burnout   "depressing"   Self-care barriers  None    Self-management support  Doctor's office;Family    Patient Concerns  Nutrition/Meal planning;Glycemic Control    Special Needs  None    Preferred Learning Style  Visual    Learning Readiness  Contemplating    How often do you need to have someone help you when you read instructions, pamphlets, or other written materials from your doctor or pharmacy?  1 - Never    What is the last grade level you completed in school?  12th       Pre-Education Assessment   Patient understands the diabetes disease and treatment process.  Needs Instruction    Patient understands incorporating nutritional management into lifestyle.  Needs Instruction    Patient undertands incorporating physical activity into lifestyle.  Needs Instruction    Patient understands using medications safely.  Needs Review    Patient understands monitoring blood glucose, interpreting and using results  Needs Review    Patient understands prevention, detection, and treatment of acute complications.  Needs Review    Patient understands prevention, detection, and treatment of chronic  complications.  Needs Review    Patient understands how to develop strategies to address psychosocial issues.  Needs Instruction    Patient understands how to develop strategies to promote health/change behavior.  Needs Instruction      Complications   Last HgB A1C per patient/outside source  12.3 % 01/15/17     How often do you check your blood sugar?  1-2 times/day    Fasting Blood glucose range (mg/dL)  180-200;>200    Have you had a dilated eye exam in the past 12 months?  No    Have you had a dental exam in the past 12 months?  No no teeth     Are you checking your feet?  Yes    How many days per week are you checking your feet?  1      Dietary Intake   Breakfast  skips    Lunch  cereal and milk; toast    Dinner  cereal and milk; beef, chicken, pork with potatoes, bread, rice, pasta, peas, green beans, corn brussel sprouts    Beverage(s)  water, unsweetened tea, coffee with SF creamer      Exercise   Exercise Type  ADL's      Patient Education   Previous Diabetes Education  No    Disease state   Explored patient's options for treatment of their diabetes    Nutrition management  Role of diet in the treatment of diabetes and the relationship between the three main macronutrients and blood glucose level;Reviewed blood glucose goals for pre and post meals and how to evaluate the patients' food intake on their blood glucose level.    Physical activity and exercise   Role of exercise on diabetes management, blood pressure control and cardiac health.    Medications  Taught/reviewed insulin injection, site rotation, insulin storage and needle disposal.;Reviewed patients medication for diabetes, action, purpose, timing of dose and side effects.    Monitoring  Purpose and frequency of SMBG.;Taught/discussed recording of test results and interpretation of SMBG.;Identified appropriate SMBG and/or A1C goals.    Acute complications  Taught treatment of hypoglycemia - the 15 rule.    Chronic  complications  Relationship between chronic complications and blood glucose control;Retinopathy and reason for yearly dilated eye exams    Psychosocial adjustment  Identified and addressed patients feelings and concerns about diabetes      Individualized Goals (developed by patient)   Reducing Risk  Improve blood sugars     Outcomes   Expected Outcomes  Other (comment) Demonstrated interest in learning. Expect minimal changes.      Future DMSE  4-6 wks       Individualized Plan for Diabetes Self-Management Training:   Learning Objective:  Patient will have a greater understanding of diabetes self-management. Patient education plan is to attend individual and/or group sessions per assessed needs and concerns.   Plan:   Patient Instructions  Check blood sugars 1 x day before breakfast or 2 hrs after one meal every day Bring blood sugar records to the next appointment Exercise: Begin walking  for 10-15  minutes   3 days a week and increase gradually as knee allows Eat 3 meals day, 1 snack a day Space meals 4-6 hours apart Don't skip meals Eat 1 serving of protein and 1 serving of carbohydrate instead of skipping Complete 3 Day Food Record and bring to next appt Make an eye doctor appointment Carry fast acting glucose and a snack at all times Rotate injection sites Return for appointment on:  Monday March 03, 2017 at 10:30 am with [facility] (dietitian)   Expected Outcomes:  Demonstrated interest in learning. Expect minimal changes.   Education material provided:  General Meal Planning Guidelines Simple Meal Plan 3 Day Food Record Symptoms, causes and treatments of Hypoglycemia  If problems or questions, patient to contact team via:  Johny Drilling, [place], CCM, CDE [phone]  Future DSME appointment: 4-6 wks  Patient reports that she is not able to get transportation to attend 3 Diabetes Classes. She agreed to attend the 2 Hour Refresher Program. Her next scheduled appointment  is March 03, 2017 with the dietitian.

## 2017-02-03 NOTE — Patient Instructions (Addendum)
Check blood sugars 1 x day before breakfast or 2 hrs after one meal every day Bring blood sugar records to the next appointment  Exercise: Begin walking  for 10-15  minutes   3 days a week and increase gradually as knee allows  Eat 3 meals day, 1 snack a day Space meals 4-6 hours apart Don't skip meals Eat 1 serving of protein and 1 serving of carbohydrate instead of skipping  Complete 3 Day Food Record and bring to next appt  Make an eye doctor appointment  Carry fast acting glucose and a snack at all times Rotate injection sites  Return for appointment on:  Monday March 03, 2017 at 10:30 am with [facility] (dietitian)

## 2017-02-11 ENCOUNTER — Ambulatory Visit (INDEPENDENT_AMBULATORY_CARE_PROVIDER_SITE_OTHER): Payer: Medicare Other

## 2017-02-11 VITALS — BP 140/74 | HR 78 | Temp 98.8°F | Resp 16 | Ht 63.0 in | Wt 220.8 lb

## 2017-02-11 DIAGNOSIS — Z0000 Encounter for general adult medical examination without abnormal findings: Secondary | ICD-10-CM | POA: Diagnosis not present

## 2017-02-11 NOTE — Progress Notes (Signed)
Subjective:   Alyssa Perkins is a 66 y.o. female who presents for Medicare Annual (Subsequent) preventive examination.  Review of Systems:   Cardiac Risk Factors include: advanced age (>56mn, >>17women);hypertension;diabetes mellitus;dyslipidemia;obesity (BMI >30kg/m2)     Objective:     Vitals: BP 140/74 (BP Location: Right Arm, Patient Position: Sitting)   Pulse 78   Temp 98.8 F (37.1 C) (Oral)   Resp 16   Ht 5' 3"  (1.6 m)   Wt 220 lb 12.8 oz (100.2 kg)   BMI 39.11 kg/m   Body mass index is 39.11 kg/m.   Tobacco Social History   Tobacco Use  Smoking Status Former Smoker  . Packs/day: 0.50  . Years: 15.00  . Pack years: 7.50  . Types: Cigarettes  . Last attempt to quit: 07/17/1994  . Years since quitting: 22.5  Smokeless Tobacco Never Used  Tobacco Comment   stopped 18 years ago     Counseling given: No Comment: stopped 18 years ago   Past Medical History:  Diagnosis Date  . Anginal pain ([facility]   . Anxiety    not on any medication at this time  . Arthritis   . Asthma    "sports asthma or exercised induced"[facility]  . Chronic back pain   . Chronic neck pain   . Complication of anesthesia    "difficulty waking up from surgery"  . Depression    has OCD, states counts constantly  . [place]   . GERD (gastroesophageal reflux disease)   . Headache(784.0)    hx of migraines  . Hyperlipidemia   . IBS (irritable bowel syndrome)   . PONV (postoperative nausea and vomiting)   . RLS (restless legs syndrome)   . Sleep apnea   . Thyroid disease    Past Surgical History:  Procedure Laterality Date  . ABDOMINAL HYSTERECTOMY     also had oophorectomy  . ANTERIOR CERVICAL DECOMP/DISCECTOMY FUSION  01/02/2012   Procedure: ANTERIOR CERVICAL DECOMPRESSION/DISCECTOMY FUSION 2 LEVELS;  Surgeon: DMelina Schools MD;  [facility]  Service: Orthopedics;  Laterality: N/A;  ACDF C5-7  .  BILATERAL CARPAL TUNNEL RELEASE    . CARDIAC CATHETERIZATION     done approx. 4- 5 years ago, Dr. HLeanor Kail does not see cardi. at this time.  . CHOLECYSTECTOMY    . DILATION AND CURETTAGE OF UTERUS     "several"  . HERNIA REPAIR    . KNEE SURGERY     1 partial left, 1 total left  . SPINE SURGERY     Family History  Problem Relation Age of Onset  . Heart disease Mother   . Osteoporosis Mother   . Osteoporosis Sister   . Diabetes Sister   . Cancer Brother        sinus, bone  . Osteoporosis Brother    Social History   Substance and Sexual Activity  Sexual Activity Not on file    Outpatient Encounter Medications as of 02/11/2017  Medication Sig  . Elastic Bandages & Supports (MEDICAL COMPRESSION STOCKINGS) MISC 1 each by Does not apply route daily.  .Marland Kitchengabapentin (NEURONTIN) 100 MG capsule Take 4 capsules in AM and 4 capsules in PM  . glucose blood (FREESTYLE TEST STRIPS) test strip Use as instructed  . hydrOXYzine (ATARAX/VISTARIL) 25 MG tablet Take 1 tablet (25 mg total) by mouth daily as needed for itching. For itching  . Insulin Glargine (TOUJEO SOLOSTAR)  300 UNIT/ML SOPN Inject 35 Units into the skin daily. (Patient taking differently: Inject 52 Units into the skin daily. )  . Insulin Pen Needle 31G X 5 MM MISC 1 each by Does not apply route daily. Use to inject Toujeo daily. Dx:E11.9 (Patient taking differently: 1 each by Does not apply route daily. Use to inject Toujeo daily. Dx:E11.9)  . Lancets (FREESTYLE) lancets Use as instructed  . omeprazole (PRILOSEC) 20 MG capsule Take 1 capsule (20 mg total) by mouth daily.  . quinapril (ACCUPRIL) 20 MG tablet Take 1 tablet (20 mg total) by mouth at bedtime.  . sertraline (ZOLOFT) 50 MG tablet Take 1 tablet (50 mg total) by mouth daily.  . simvastatin (ZOCOR) 40 MG tablet Take 1 tablet (40 mg total) by mouth at bedtime. Reported on 05/16/2015  . traMADol (ULTRAM) 50 MG tablet Take 1-2 tablets (50-100 mg total) by mouth every  8 (eight) hours as needed.  . traZODone (DESYREL) 50 MG tablet Take 1-2 tablets (50-100 mg total) by mouth at bedtime. Start 1 tab and if not helping by 2 to 4 weeks you can increase to 2 pills bedtime  . glucose monitoring kit (FREESTYLE) monitoring kit 1 each by Does not apply route as needed for other. CHeck blood glucose once daily before insulin injections. For ICD 10 E11.65  . [DISCONTINUED] Probiotic CAPS Take 1 capsule by mouth daily. (Patient not taking: Reported on 02/11/2017)   No facility-administered encounter medications on file as of 02/11/2017.     Activities of Daily Living In your present state of health, do you have any difficulty performing the following activities: 02/11/2017 03/25/2016  Hearing? Y N  Vision? Y N  Comment going to eye doctor soon  -  Difficulty concentrating or making decisions? Tempie Donning  Walking or climbing stairs? N Y  Comment - upstair because of her knee  Dressing or bathing? N N  Doing errands, shopping? N N  Preparing Food and eating ? N -  Using the Toilet? N -  In the past six months, have you accidently leaked urine? N -  Do you have problems with loss of bowel control? N -  Managing your Medications? N -  Managing your Finances? N -  Housekeeping or managing your Housekeeping? N -  Some recent data might be hidden    Patient Care Team: [name], DO as PCP - General (Family Medicine)    Assessment:     Exercise Activities and Dietary recommendations Current Exercise Habits: The patient does not participate in regular exercise at present, Exercise limited by: None identified  Goals    . DIET - INCREASE WATER INTAKE     Recommend drinking at least 6-8 glasses of water a day       Fall Risk Fall Risk  02/11/2017 02/03/2017 06/06/2016 05/16/2015  Falls in the past year? Yes Yes No No  Number falls in past yr: 1 2 or more - -  Injury with Fall? Yes Yes - -  Comment - Hit head on cement and hurt head -  seen in ER, CT  scan clear - -  Risk Factor Category  High Fall Risk - - -  Risk for fall due to : - Impaired balance/gait - -  Follow up Falls prevention discussed Falls prevention discussed;Education provided - -   Depression Screen PHQ 2/9 Scores 02/11/2017 02/03/2017 01/15/2017 10/15/2016  PHQ - 2 Score 2 4 5 4   PHQ- 9 Score 3 17 19  13  Cognitive Function     6CIT Screen 02/11/2017  What Year? 0 points  What month? 0 points  What time? 0 points  Count back from 20 0 points  Months in reverse 0 points  Repeat phrase 0 points  Total Score 0    Immunization History  Administered Date(s) Administered  . Influenza, High Dose Seasonal PF 01/15/2017   Screening Tests Health Maintenance  Topic Date Due  . MAMMOGRAM  03/18/2014  . OPHTHALMOLOGY EXAM  11/23/2016  . PNA vac Low Risk Adult (1 of 2 - PCV13) 03/18/2017 (Originally 12/08/2015)  . Hepatitis C Screening  09/15/2017 (Originally Jul 28, 1950)  . HEMOGLOBIN A1C  04/17/2017  . FOOT EXAM  01/15/2018  . COLONOSCOPY  03/18/2020  . TETANUS/TDAP  03/18/2022  . INFLUENZA VACCINE  Completed  . DEXA SCAN  Completed      Plan:      I have personally reviewed and addressed the Medicare Annual Wellness questionnaire and have noted the following in the patient's chart:  A. Medical and social history B. Use of alcohol, tobacco or illicit drugs  C. Current medications and supplements D. Functional ability and status E.  Nutritional status F.  Physical activity G. Advance directives H. List of other physicians I.  Hospitalizations, surgeries, and ER visits in previous 12 months J.  [place] such as hearing and vision if needed, cognitive and depression L. Referrals and appointments   In addition, I have reviewed and discussed with patient certain preventive protocols, quality metrics, and best practice recommendations. A written personalized care plan for preventive services as well as general preventive health recommendations  were provided to patient.   Signed,  Tyler Aas, LPN Nurse Health Advisor   Nurse Notes: none

## 2017-02-11 NOTE — Patient Instructions (Addendum)
Alyssa Perkins , Thank you for taking time to come for your Medicare Wellness Visit. I appreciate your ongoing commitment to your health goals. Please review the following plan we discussed and let me know if I can assist you in the future.   Screening recommendations/referrals: Colonoscopy: completed 03/18/2010, due 03/2020 Mammogram: due now Please call [phone] to schedule your mammogram.  Bone Density: completed 03/18/2012, no longer required Recommended yearly ophthalmology/optometry visit for glaucoma screening and checkup Recommended yearly dental visit for hygiene and checkup  Vaccinations: Influenza vaccine: up to date Pneumococcal vaccine: up to date Tdap vaccine: up to date  Shingles vaccine: due, check with your insurance company for coverage     Advanced directives: Advance directive discussed with you today. I have provided a copy for you to complete at home and have notarized. Once this is complete please bring a copy in to our office so we can scan it into your chart.  Conditions/risks identified: Recommend drinking at least 6-8 glasses of water a day   Next appointment: Follow up on 02/21/2017 at 10:40am with Dr.Karamalegos. Follow up in one year for your annual wellness exam.   Preventive Care 65 Years and Older, Female Preventive care refers to lifestyle choices and visits with your health care provider that can promote health and wellness. What does preventive care include?  A yearly physical exam. This is also called an annual well check.  Dental exams once or twice a year.  Routine eye exams. Ask your health care provider how often you should have your eyes checked.  Personal lifestyle choices, including:  Daily care of your teeth and gums.  Regular physical activity.  Eating a healthy diet.  Avoiding tobacco and drug use.  Limiting alcohol use.  Practicing safe sex.  Taking low-dose aspirin every day.  Taking vitamin and mineral supplements as  recommended by your health care provider. What happens during an annual well check? The services and screenings done by your health care provider during your annual well check will depend on your age, overall health, lifestyle risk factors, and family history of disease. Counseling  Your health care provider may ask you questions about your:  Alcohol use.  Tobacco use.  Drug use.  Emotional well-being.  Home and relationship well-being.  Sexual activity.  Eating habits.  History of falls.  Memory and ability to understand (cognition).  Work and work Statistician.  Reproductive health. Screening  You may have the following tests or measurements:  Height, weight, and BMI.  Blood pressure.  Lipid and cholesterol levels. These may be checked every 5 years, or more frequently if you are over 15 years old.  Skin check.  Lung cancer screening. You may have this screening every year starting at age 77 if you have a 30-pack-year history of smoking and currently smoke or have quit within the past 15 years.  Fecal occult blood test (FOBT) of the stool. You may have this test every year starting at age 3.  Flexible sigmoidoscopy or colonoscopy. You may have a sigmoidoscopy every 5 years or a colonoscopy every 10 years starting at age 67.  Hepatitis C blood test.  Hepatitis B blood test.  Sexually transmitted disease (STD) testing.  Diabetes screening. This is done by checking your blood sugar (glucose) after you have not eaten for a while (fasting). You may have this done every 1-3 years.  Bone density scan. This is done to screen for osteoporosis. You may have this done starting at age 39.  Mammogram. This may be done every 1-2 years. Talk to your health care provider about how often you should have regular mammograms. Talk with your health care provider about your test results, treatment options, and if necessary, the need for more tests. Vaccines  Your health care  provider may recommend certain vaccines, such as:  Influenza vaccine. This is recommended every year.  Tetanus, diphtheria, and acellular pertussis (Tdap, Td) vaccine. You may need a Td booster every 10 years.  Zoster vaccine. You may need this after age 38.  Pneumococcal 13-valent conjugate (PCV13) vaccine. One dose is recommended after age 91.  Pneumococcal polysaccharide (PPSV23) vaccine. One dose is recommended after age 29. Talk to your health care provider about which screenings and vaccines you need and how often you need them. This information is not intended to replace advice given to you by your health care provider. Make sure you discuss any questions you have with your health care provider. Document Released: 03/31/2015 Document Revised: 11/22/2015 Document Reviewed: 01/03/2015 Elsevier Interactive Patient Education  2017 [employer] Prevention in the Home Falls can cause injuries. They can happen to people of all ages. There are many things you can do to make your home safe and to help prevent falls. What can I do on the outside of my home?  Regularly fix the edges of walkways and driveways and fix any cracks.  Remove anything that might make you trip as you walk through a door, such as a raised step or threshold.  Trim any bushes or trees on the path to your home.  Use bright outdoor lighting.  Clear any walking paths of anything that might make someone trip, such as rocks or tools.  Regularly check to see if handrails are loose or broken. Make sure that both sides of any steps have handrails.  Any raised decks and porches should have guardrails on the edges.  Have any leaves, snow, or ice cleared regularly.  Use sand or salt on walking paths during winter.  Clean up any spills in your garage right away. This includes oil or grease spills. What can I do in the bathroom?  Use night lights.  Install grab bars by the toilet and in the tub and shower. Do  not use towel bars as grab bars.  Use non-skid mats or decals in the tub or shower.  If you need to sit down in the shower, use a plastic, non-slip stool.  Keep the floor dry. Clean up any water that spills on the floor as soon as it happens.  Remove soap buildup in the tub or shower regularly.  Attach bath mats securely with double-sided non-slip rug tape.  Do not have throw rugs and other things on the floor that can make you trip. What can I do in the bedroom?  Use night lights.  Make sure that you have a light by your bed that is easy to reach.  Do not use any sheets or blankets that are too big for your bed. They should not hang down onto the floor.  Have a firm chair that has side arms. You can use this for support while you get dressed.  Do not have throw rugs and other things on the floor that can make you trip. What can I do in the kitchen?  Clean up any spills right away.  Avoid walking on wet floors.  Keep items that you use a lot in easy-to-reach places.  If you need to reach  something above you, use a strong step stool that has a grab bar.  Keep electrical cords out of the way.  Do not use floor polish or wax that makes floors slippery. If you must use wax, use non-skid floor wax.  Do not have throw rugs and other things on the floor that can make you trip. What can I do with my stairs?  Do not leave any items on the stairs.  Make sure that there are handrails on both sides of the stairs and use them. Fix handrails that are broken or loose. Make sure that handrails are as long as the stairways.  Check any carpeting to make sure that it is firmly attached to the stairs. Fix any carpet that is loose or worn.  Avoid having throw rugs at the top or bottom of the stairs. If you do have throw rugs, attach them to the floor with carpet tape.  Make sure that you have a light switch at the top of the stairs and the bottom of the stairs. If you do not have them,  ask someone to add them for you. What else can I do to help prevent falls?  Wear shoes that:  Do not have high heels.  Have rubber bottoms.  Are comfortable and fit you well.  Are closed at the toe. Do not wear sandals.  If you use a stepladder:  Make sure that it is fully opened. Do not climb a closed stepladder.  Make sure that both sides of the stepladder are locked into place.  Ask someone to hold it for you, if possible.  Clearly mark and make sure that you can see:  Any grab bars or handrails.  First and last steps.  Where the edge of each step is.  Use tools that help you move around (mobility aids) if they are needed. These include:  Canes.  Walkers.  Scooters.  Crutches.  Turn on the lights when you go into a dark area. Replace any light bulbs as soon as they burn out.  Set up your furniture so you have a clear path. Avoid moving your furniture around.  If any of your floors are uneven, fix them.  If there are any pets around you, be aware of where they are.  Review your medicines with your doctor. Some medicines can make you feel dizzy. This can increase your chance of falling. Ask your doctor what other things that you can do to help prevent falls. This information is not intended to replace advice given to you by your health care provider. Make sure you discuss any questions you have with your health care provider. Document Released: 12/29/2008 Document Revised: 08/10/2015 Document Reviewed: 04/08/2014 Elsevier Interactive Patient Education  2017 [employer].

## 2017-02-17 ENCOUNTER — Other Ambulatory Visit: Payer: Self-pay

## 2017-02-17 DIAGNOSIS — Z794 Long term (current) use of insulin: Principal | ICD-10-CM

## 2017-02-17 DIAGNOSIS — IMO0002 Reserved for concepts with insufficient information to code with codable children: Secondary | ICD-10-CM

## 2017-02-17 DIAGNOSIS — E1165 Type 2 diabetes mellitus with hyperglycemia: Principal | ICD-10-CM

## 2017-02-17 DIAGNOSIS — E1140 Type 2 diabetes mellitus with diabetic neuropathy, unspecified: Secondary | ICD-10-CM

## 2017-02-17 MED ORDER — INSULIN GLARGINE 300 UNIT/ML ~~LOC~~ SOPN: 52.0000 [IU] | PEN_INJECTOR | Freq: Every day | SUBCUTANEOUS | 3 refills | Status: DC

## 2017-02-17 NOTE — Telephone Encounter (Signed)
Patient called stating she is on 52 units of Toujeo.  She needs a new rx sent into [facility].

## 2017-02-18 DIAGNOSIS — H04123 Dry eye syndrome of bilateral lacrimal glands: Secondary | ICD-10-CM | POA: Diagnosis not present

## 2017-02-18 DIAGNOSIS — E113393 Type 2 diabetes mellitus with moderate nonproliferative diabetic retinopathy without macular edema, bilateral: Secondary | ICD-10-CM | POA: Diagnosis not present

## 2017-02-18 DIAGNOSIS — H40013 Open angle with borderline findings, low risk, bilateral: Secondary | ICD-10-CM | POA: Diagnosis not present

## 2017-02-18 LAB — HM DIABETES EYE EXAM

## 2017-02-21 ENCOUNTER — Encounter: Payer: Self-pay | Admitting: Family Medicine

## 2017-02-21 ENCOUNTER — Ambulatory Visit (INDEPENDENT_AMBULATORY_CARE_PROVIDER_SITE_OTHER): Payer: Medicare Other | Admitting: Family Medicine

## 2017-02-21 VITALS — BP 139/64 | HR 81 | Temp 98.3°F | Resp 16 | Ht 63.0 in | Wt 222.2 lb

## 2017-02-21 DIAGNOSIS — F5101 Primary insomnia: Secondary | ICD-10-CM | POA: Diagnosis not present

## 2017-02-21 DIAGNOSIS — G44221 Chronic tension-type headache, intractable: Secondary | ICD-10-CM

## 2017-02-21 DIAGNOSIS — G8929 Other chronic pain: Secondary | ICD-10-CM

## 2017-02-21 DIAGNOSIS — M545 Low back pain: Secondary | ICD-10-CM

## 2017-02-21 DIAGNOSIS — J0110 Acute frontal sinusitis, unspecified: Secondary | ICD-10-CM | POA: Diagnosis not present

## 2017-02-21 DIAGNOSIS — R519 Headache, unspecified: Secondary | ICD-10-CM | POA: Insufficient documentation

## 2017-02-21 DIAGNOSIS — M542 Cervicalgia: Secondary | ICD-10-CM | POA: Diagnosis not present

## 2017-02-21 DIAGNOSIS — F331 Major depressive disorder, recurrent, moderate: Secondary | ICD-10-CM

## 2017-02-21 DIAGNOSIS — R51 Headache: Secondary | ICD-10-CM | POA: Diagnosis not present

## 2017-02-21 DIAGNOSIS — G4733 Obstructive sleep apnea (adult) (pediatric): Secondary | ICD-10-CM | POA: Diagnosis not present

## 2017-02-21 DIAGNOSIS — Z9989 Dependence on other enabling machines and devices: Secondary | ICD-10-CM | POA: Diagnosis not present

## 2017-02-21 DIAGNOSIS — G4700 Insomnia, unspecified: Secondary | ICD-10-CM | POA: Insufficient documentation

## 2017-02-21 MED ORDER — AMOXICILLIN-POT CLAVULANATE 875-125 MG PO TABS: 1.0000 | ORAL_TABLET | Freq: Two times a day (BID) | ORAL | 0 refills | Status: DC

## 2017-02-21 MED ORDER — TRAMADOL HCL 50 MG PO TABS
50.0000 mg | ORAL_TABLET | Freq: Three times a day (TID) | ORAL | 0 refills | Status: DC | PRN
Start: 2017-02-21 — End: 2017-09-02

## 2017-02-21 NOTE — Progress Notes (Signed)
Subjective:    Patient ID: Alyssa Perkins, female    DOB: Sep 12, 1950, 66 y.o.   MRN: [id number]  Alyssa Perkins is a 66 y.o. female presenting on 02/21/2017 for Headache and Insomnia   HPI   SINUSITIS / Headaches / Chronic Pain / OA/DJD - Last visit with me 01/15/17, for same problem, treated with trial on Tramadol PRN pain and night-time, see prior notes for background information. - Interval update with limited improvement - Today patient reports she has been taking tramadol in evening and thinks it "keeps" her awake, has not tried to take it earlier. Also has Trazodone tried for sleep but does not notice if helping as much since staying awake more. - Describes sinus pressure frontal and behind eyes, some neck pain and stiffness radiating down into shoulders L>R - Also sinus symptoms with congestion cough and recurrent worsening had prior URI then improved now worse again - Neck and back pain is worse with poor sleep - Not improved on NSAIDs high dose ibuprofen, tried Tylenol but not taking regularly. Prior use of Baclofen limited relief, out of this now, not interested in refill, has Gabapentin for neuropathy and back but limited relief for headaches - headache on R side, seems worse with poor sleep - Also chronic joint knee and back pain - She is awaiting new CPAP equipement  Major Depression / Anxiety / Insomnia - Chronic problem, reviewed last visit, has been on Sertraline regularly did run out recently but this was refilled, now she feels like this is not helping her, see scores PHQ and GAD below. Problem with several people living at home she cannot get quiet time, difficulty with mood and anxiety and other life stressors.  - Last visit added Trazodone limited relief, did noto adjust Sertraline yet - Also regarding sleep, it has still been poor recently see below on OSA, problems with CPAP - Denies suicidal or homicidal ideation  Additional update: - Last DM Eye exam  saw Dr Ellin Mayhew on 02/18/17, still difficulty with vision cannot see well, was told did not need glasses, only has reading glasses  Health Maintenance: - Last Mammogram 2014, due for repeat, prior negative. [facility] shows a mammogram on 11/14/15 do not have record from this. Requesting order for [facility]  Depression screen [facility] 2/9 02/21/2017 02/11/2017 02/03/2017  Decreased Interest 3 1 3   Down, Depressed, Hopeless 2 1 1   PHQ - 2 Score 5 2 4   Altered sleeping 3 0 3  Tired, decreased energy 3 1 3   Change in appetite 2 0 2  Feeling bad or failure about yourself  2 0 0  Trouble concentrating 2 0 3  Moving slowly or fidgety/restless 2 0 2  Suicidal thoughts 0 0 0  PHQ-9 Score 19 3 17   Difficult doing work/chores Not difficult at all Not difficult at all Not difficult at all   GAD 7 : Generalized Anxiety Score 01/15/2017  Nervous, Anxious, on Edge 2  Control/stop worrying 3  Worry too much - different things 3  Trouble relaxing 2  Restless 2  Easily annoyed or irritable 3  Afraid - awful might happen 2  Total GAD 7 Score 17  Anxiety Difficulty Somewhat difficult    Social History   Tobacco Use  . Smoking status: Former Smoker    Packs/day: 0.50    Years: 15.00    Pack years: 7.50    Types: Cigarettes    Last attempt to quit: 07/17/1994    Years since  quitting: 22.6  . Smokeless tobacco: Never Used  . Tobacco comment: stopped 18 years ago  Substance Use Topics  . Alcohol use: No  . Drug use: No    Review of Systems Per HPI unless specifically indicated above     Objective:    BP 139/64   Pulse 81   Temp 98.3 F (36.8 C) (Oral)   Resp 16   Ht 5' 3"  (1.6 m)   Wt 222 lb 3.2 oz (100.8 kg)   BMI 39.36 kg/m   Wt Readings from Last 3 Encounters:  02/21/17 222 lb 3.2 oz (100.8 kg)  02/11/17 220 lb 12.8 oz (100.2 kg)  02/03/17 222 lb 1.6 oz (100.7 kg)    Physical Exam  Constitutional: She is oriented to person, place, and time. She appears well-developed and  well-nourished. No distress.  Well-appearing, slightly uncomfortable with headache, cooperative, obese  HENT:  Head: Normocephalic and atraumatic.  Mouth/Throat: Oropharynx is clear and moist.  Frontal sinuses mild tender. Nares patent with some deeper turbinate edema without purulence. Bilateral TMs clear without erythema, effusion or bulging. Oropharynx clear without erythema, exudates, edema or asymmetry.  Poor dentition  Eyes: Conjunctivae are normal. Right eye exhibits no discharge. Left eye exhibits no discharge.  Neck: Normal range of motion. Neck supple. No thyromegaly present.  Neck Inspection: Mostly normal appearing, no obvious deformity Palpation: some muscle spasm and hypertonicity cervical spine paraspinal muscles L>R ROM: slightly limited rotation, intact flex/ext Special Testing: spurling's negative Strength: distal intact Neurovascular: distal intact  Cardiovascular: Normal rate, regular rhythm, normal heart sounds and intact distal pulses.  No murmur heard. Pulmonary/Chest: Effort normal and breath sounds normal. No respiratory distress. She has no wheezes. She has no rales.  Musculoskeletal: Normal range of motion. She exhibits no edema.  Lymphadenopathy:    She has no cervical adenopathy.  Neurological: She is alert and oriented to person, place, and time.  Skin: Skin is warm and dry. No rash noted. She is not diaphoretic. No erythema.  Psychiatric: Her behavior is normal.  Well groomed, good eye contact, normal speech and thoughts. Slightly anxious appearing. Mood declined since last visit.  Nursing note and vitals reviewed.  Results for orders placed or performed in visit on 10/15/16  COMPLETE METABOLIC PANEL WITH GFR  Result Value Ref Range   Sodium 139 135 - 146 mmol/L   Potassium 4.8 3.5 - 5.3 mmol/L   Chloride 103 98 - 110 mmol/L   CO2 24 20 - 31 mmol/L   Glucose, Bld 313 (H) 65 - 99 mg/dL   BUN 19 7 - 25 mg/dL   Creat 0.87 0.50 - 0.99 mg/dL   Total  Bilirubin 0.5 0.2 - 1.2 mg/dL   Alkaline Phosphatase 119 33 - 130 U/L   AST 20 10 - 35 U/L   ALT 15 6 - 29 U/L   Total Protein 6.4 6.1 - 8.1 g/dL   Albumin 3.9 3.6 - 5.1 g/dL   Calcium 9.1 8.6 - 10.4 mg/dL   GFR, Est African American 81 >=60 mL/min   GFR, Est Non African American 70 >=60 mL/min  Lipid panel  Result Value Ref Range   Cholesterol 150 <200 mg/dL   Triglycerides 257 (H) <150 mg/dL   HDL 37 (L) >50 mg/dL   Total CHOL/HDL Ratio 4.1 <5.0 Ratio   VLDL 51 (H) <30 mg/dL   LDL Cholesterol 62 <100 mg/dL  CBC with Differential/Platelet  Result Value Ref Range   WBC 8.3 3.8 - 10.8 K/uL  RBC 5.10 3.80 - 5.10 MIL/uL   Hemoglobin 14.3 11.7 - 15.5 g/dL   HCT 43.8 35.0 - 45.0 %   MCV 85.9 80.0 - 100.0 fL   MCH 28.0 27.0 - 33.0 pg   MCHC 32.6 32.0 - 36.0 g/dL   RDW 14.7 11.0 - 15.0 %   Platelets 251 140 - 400 K/uL   MPV 10.0 7.5 - 12.5 fL   Neutro Abs 5,395 1,500 - 7,800 cells/uL   Lymphs Abs 2,158 850 - 3,900 cells/uL   Monocytes Absolute 498 200 - 950 cells/uL   Eosinophils Absolute 249 15 - 500 cells/uL   Basophils Absolute 0 0 - 200 cells/uL   Neutrophils Relative % 65 %   Lymphocytes Relative 26 %   Monocytes Relative 6 %   Eosinophils Relative 3 %   Basophils Relative 0 %   Smear Review Criteria for review not met   TSH  Result Value Ref Range   TSH 3.56 mIU/L  POCT HgB A1C  Result Value Ref Range   Hemoglobin A1C 13.8 (A) 5.7      Assessment & Plan:   Problem List Items Addressed This Visit    Chronic back pain   Relevant Medications   traMADol (ULTRAM) 50 MG tablet   Chronic headaches   Relevant Medications   traMADol (ULTRAM) 50 MG tablet   Chronic neck pain   Relevant Medications   traMADol (ULTRAM) 50 MG tablet   Depression   Insomnia   OSA on CPAP - Primary    Other Visit Diagnoses    Acute non-recurrent frontal sinusitis       Relevant Medications   amoxicillin-clavulanate (AUGMENTIN) 875-125 MG tablet      Constellation of symptoms  seems interrelated, poor sleep with waiting on new CPAP equipment failed first set up, has persistent headaches, uncertain if stress or anxiety vs tension or sinus, think all related, now recurrent URI sinusitis. - Additionall mood worse with depression / anxiety  Plan: 1. Trial tramadol during day instead of night if keeping her awake, may improve headache and pain, one more short term rx given will discontinue if not helpful - For muscles demonstrated soft tissue massage technique for neck and upper back trapezius region, work on this at home and try to improve tension aspect of headaches neck pain 2. Take Trazodone nightly for sleep, and mood - if not effective can titrate up dose or follow-up and we can adjust sertraline 3. For OSA needs new equipment 4. For Sinus, trial on Augmentin antibiotic 5. Follow-up as planned return criteria given if worse   Meds ordered this encounter  Medications  . traMADol (ULTRAM) 50 MG tablet    Sig: Take 1-2 tablets (50-100 mg total) by mouth every 8 (eight) hours as needed.    Dispense:  30 tablet    Refill:  0  . amoxicillin-clavulanate (AUGMENTIN) 875-125 MG tablet    Sig: Take 1 tablet by mouth 2 (two) times daily.    Dispense:  20 tablet    Refill:  0    Follow up plan: Return in about 3 months (around 05/22/2017) for Headaches, Depression PHQ, Pain.  Nobie Putnam, [facility] 02/22/2017, 9:27 AM

## 2017-02-21 NOTE — Patient Instructions (Addendum)
Thank you for coming to the clinic today.  1. Refilled Toujeo on Monday, let me know if problems  2. We will re fax Tramadol rx to [facility] take during the day for neck pain and headaches  3. Try Trazodone at bedtime for sleep - take 1-2 pills as needed if improved  4. Try the message and muscle tissue techniques for neck and head as discussed  Try Augmentin for sinus infection for 10 days  Upcoming apt at [facility] Endocrinology  02/27/17 - Dr Pablo Ledger 03/03/17 - DIabetes Nutrition 04/18/16 -Dr Joneen Caraway  Providers    Pablo Ledger, Early Chars, CPP   Endocrinology   NPI: [id number]   [place] [address]      Phone: [phone]    Please schedule a Follow-up Appointment to: Return in about 3 months (around 05/22/2017) for Headaches, Depression PHQ, Pain.  If you have any other questions or concerns, please feel free to call the clinic or send a message through [place]. You may also schedule an earlier appointment if necessary.  Additionally, you may be receiving a survey about your experience at our clinic within a few days to 1 week by e-mail or mail. We value your feedback.  Nobie Putnam, DO [place]

## 2017-02-26 ENCOUNTER — Encounter: Payer: Self-pay | Admitting: Family Medicine

## 2017-02-26 DIAGNOSIS — E11319 Type 2 diabetes mellitus with unspecified diabetic retinopathy without macular edema: Secondary | ICD-10-CM | POA: Insufficient documentation

## 2017-03-03 ENCOUNTER — Ambulatory Visit: Admitting: Dietician

## 2017-03-20 ENCOUNTER — Encounter: Payer: Self-pay | Admitting: Family Medicine

## 2017-03-21 ENCOUNTER — Telehealth: Payer: Self-pay | Admitting: Dietician

## 2017-03-21 ENCOUNTER — Ambulatory Visit
Admission: RE | Admit: 2017-03-21 | Discharge: 2017-03-21 | Disposition: A | Discharge status: Home / Self Care | Payer: Medicare Other | Source: Ambulatory Visit | Attending: Family Medicine | Admitting: Family Medicine

## 2017-03-21 DIAGNOSIS — Z1231 Encounter for screening mammogram for malignant neoplasm of breast: Secondary | ICD-10-CM | POA: Diagnosis not present

## 2017-03-21 DIAGNOSIS — Z1239 Encounter for other screening for malignant neoplasm of breast: Secondary | ICD-10-CM

## 2017-03-21 NOTE — Telephone Encounter (Signed)
Called patient to reschedule appointment which she missed on 03/03/17; rescheduled for 03/31/17.

## 2017-03-28 ENCOUNTER — Telehealth: Payer: Self-pay | Admitting: Dietician

## 2017-03-28 NOTE — Telephone Encounter (Signed)
Called patient to reschedule her appointment on 03/31/17 due to pending winter weather; rescheduled to 04/07/17 at 11am and sent the date and time to patient by email per her request.

## 2017-03-31 ENCOUNTER — Ambulatory Visit: Admitting: Dietician

## 2017-04-07 ENCOUNTER — Ambulatory Visit: Admitting: Dietician

## 2017-04-17 ENCOUNTER — Encounter: Payer: Self-pay | Admitting: Dietician

## 2017-04-17 NOTE — Progress Notes (Signed)
Patient has missed 2 schedule appointments (a third due to office closing during inclement weather), and has not rescheduled at this time. Sent discharge letter to referring provider.

## 2017-04-18 ENCOUNTER — Encounter: Payer: Self-pay | Admitting: Family Medicine

## 2017-04-18 ENCOUNTER — Ambulatory Visit
Admission: RE | Admit: 2017-04-18 | Discharge: 2017-04-18 | Disposition: A | Discharge status: Home / Self Care | Payer: Medicare Other | Source: Ambulatory Visit | Attending: Family Medicine | Admitting: Family Medicine

## 2017-04-18 ENCOUNTER — Ambulatory Visit (INDEPENDENT_AMBULATORY_CARE_PROVIDER_SITE_OTHER): Payer: Medicare Other | Admitting: Family Medicine

## 2017-04-18 VITALS — BP 128/67 | HR 87 | Temp 98.7°F | Resp 16 | Ht 63.0 in | Wt 224.0 lb

## 2017-04-18 DIAGNOSIS — R05 Cough: Secondary | ICD-10-CM | POA: Diagnosis not present

## 2017-04-18 DIAGNOSIS — J029 Acute pharyngitis, unspecified: Secondary | ICD-10-CM | POA: Diagnosis not present

## 2017-04-18 DIAGNOSIS — R0602 Shortness of breath: Secondary | ICD-10-CM | POA: Diagnosis not present

## 2017-04-18 DIAGNOSIS — R059 Cough, unspecified: Secondary | ICD-10-CM

## 2017-04-18 DIAGNOSIS — R6883 Chills (without fever): Secondary | ICD-10-CM

## 2017-04-18 DIAGNOSIS — W19XXXA Unspecified fall, initial encounter: Secondary | ICD-10-CM

## 2017-04-18 DIAGNOSIS — R0600 Dyspnea, unspecified: Secondary | ICD-10-CM

## 2017-04-18 DIAGNOSIS — M25531 Pain in right wrist: Secondary | ICD-10-CM

## 2017-04-18 DIAGNOSIS — J0110 Acute frontal sinusitis, unspecified: Secondary | ICD-10-CM | POA: Diagnosis not present

## 2017-04-18 LAB — POCT INFLUENZA A/B
INFLUENZA B, POC: NEGATIVE
Influenza A, POC: NEGATIVE

## 2017-04-18 LAB — POCT RAPID STREP A (OFFICE): Rapid Strep A Screen: NEGATIVE

## 2017-04-18 MED ORDER — AMOXICILLIN 500 MG PO CAPS: 500.0000 mg | ORAL_CAPSULE | Freq: Two times a day (BID) | ORAL | 0 refills | Status: DC

## 2017-04-18 NOTE — Progress Notes (Signed)
Subjective:    Patient ID: Alyssa Perkins, female    DOB: [birth year], 67 y.o.   MRN: [id number]  Alyssa Perkins is a 67 y.o. female presenting on 04/18/2017 for Cough (sore throat ear pain bodyche grandson and daughter were sick onset 4 days)   HPI   SINUSITIS, Subacute / Headaches - Last visit with me 02/21/17, for nearly same problem with sinusitis and headaches, see prior note, treated with Augmentin antibiotics among other discussion for chronic conditions headaches and OSA CPAP, also already on Tramadol PRN for headaches, see prior notes for background information. - Interval update with she never took the Augmentin antibiotic now 2 months later, she attempted to fill it but was told cost $67, she did not pick it up, was told [facility] $4 list had other med similar likely amoxicillin but was unable to switch rx and she never called our office about this to notify us. She was given full face mask for CPAP machine and still had problem with it due to breathing. - Today patient reports still problem with sinuses and headache. Now worse over past 1-2 week ago with frontal central headaches, and then she fell after headaches started, see fall HPI below, made them worse, did not seek care. - Admits some thicker congestion, sometimes deeper in chest affecting her breathing feels short of breath on occasion, improved if could clear congestion, not taking any other OTC meds in past has been on mucinex did not get any now - Multiple sick contacts similar URI sinus at home, no one else has insurance they cannot be seen by doctor - Denies any fevers/chills, nausea vomiting, abdominal pain, diarrhea  FALL, R wrist pain injury, head injury Recent injury 1-2 weeks ago, fall at home. She was taking dog outside and she was trying to go out a ramp outside her house, and the dog jerked the leash and she fell and hit the Left side of wrist and left arm, had some bruising, and hit her R knee with some local  swelling. She stated her Left temple area of head hit a stump outside and hurt, but did not cause bleeding or laceration, no bruising or deformity, she had bruising of her wrists. - She did not seek care at hospital after fall, and figured she would "come in to doctor eventually if it didn't get better", she is limited by transportation  Additional concerns not focus of acute visit today: - Diabetes: she is followed by [facility] Endocr[facility]ology last seen 01/15/17, has been managed on Toujeo, has had problems with the pens being defective, has sent back, still needs higher amount, she has re-scheduled a few apt with their office.  Depression screen [facility] 2/9 02/21/2017 02/11/2017 02/03/2017  Decreased Interest 3 1 3   Down, Depressed, Hopeless 2 1 1   PHQ - 2 Score 5 2 4   Altered sleeping 3 0 3  Tired, decreased energy 3 1 3   Change in appetite 2 0 2  Feeling bad or failure about yourself  2 0 0  Trouble concentrating 2 0 3  Moving slowly or fidgety/restless 2 0 2  Suicidal thoughts 0 0 0  PHQ-9 Score 19 3 17   Difficult doing work/chores Not difficult at all Not difficult at all Not difficult at all    Social History   Tobacco Use  . Smoking status: Former Smoker    Packs/day: 0.50    Years: 15.00    Pack years: 7.50    Types: Cigarettes  Last attempt to quit: 07/17/1994    Years since quitting: 22.7  . Smokeless tobacco: Never Used  . Tobacco comment: stopped 18 years ago  Substance Use Topics  . Alcohol use: No  . Drug use: No    Review of Systems Per HPI unless specifically indicated above     Objective:    BP 128/67   Pulse 87   Temp 98.7 F (37.1 C) (Oral)   Resp 16   Ht 5\' 3"  (1.6 m)   Wt 224 lb (101.6 kg)   SpO2 99%   BMI 39.68 kg/m   Wt Readings from Last 3 Encounters:  04/18/17 224 lb (101.6 kg)  02/21/17 222 lb 3.2 oz (100.8 kg)  02/11/17 220 lb 12.8 oz (100.2 kg)    Physical Exam  Constitutional: She is oriented to person, place, and time. She appears  well-developed and well-nourished. No distress.  Mildly ill appearing, uncomfortable headache sinus pressure, cooperative  HENT:  Head: Normocephalic and atraumatic.  Mouth/Throat: Oropharynx is clear and moist.  No evidence of head trauma. Left temporal / parietal area of impact appears completely normal without ecchymosis, laceration or scab or deformity. Non tender.  Frontal / maxillary sinuses mild generalized tender. Nares with some turbinate edema without purulence. L TM with opaque effusion and some minimal erythema, R TM has some clear effusion, no erythema.  Oropharynx mostly clear without erythema, exudates, edema or asymmetry.  Eyes: Conjunctivae are normal. Right eye exhibits no discharge. Left eye exhibits no discharge.  Neck: Normal range of motion. Neck supple.  Cardiovascular: Normal rate, regular rhythm, normal heart sounds and intact distal pulses.  No murmur heard. Pulmonary/Chest: Effort normal. No respiratory distress. She has no wheezes. She has no rales.  Mild reduced air movement bilateral bases, some scattered rhonchi clear with cough. No obvious wheezing or focal crackles  Musculoskeletal: Normal range of motion. She exhibits no edema.  Right / Left Hand/Wrist Inspection: Slight deformity R wrist ulnar styloid region with resolving ecchymosis and increased fullness without obvious edema. L wrist seems less affected. Palpation: Localized tender to bilateral R>L ulnar styloid.  Non tender hand, carpal bones, including MCP, base of thumb. No distinct anatomical snuff box or scaphoid tenderness.  ROM: full active wrist ROM flex / ext, ulnar / radial deviation  Strength: 5/5 grip, thumb opposition, wrist flex/ext Neurovascular: distally intact  Lymphadenopathy:    She has no cervical adenopathy.  Neurological: She is alert and oriented to person, place, and time.  Skin: Skin is warm and dry. No rash noted. She is not diaphoretic. No erythema.  Psychiatric: She has a  normal mood and affect. Her behavior is normal.  Well groomed, good eye contact, normal speech and thoughts  Nursing note and vitals reviewed.  Results for orders placed or performed in visit on 04/18/17  POCT rapid strep A  Result Value Ref Range   Rapid Strep A Screen Negative Negative  POCT Influenza A/B  Result Value Ref Range   Influenza A, POC Negative Negative   Influenza B, POC Negative Negative      Assessment & Plan:   Problem List Items Addressed This Visit    None    Visit Diagnoses    Subacute frontal sinusitis    -  Primary   Relevant Medications   amoxicillin (AMOXIL) 500 MG capsule   Other Relevant Orders   DG Chest 2 View   Chills (without fever)       Relevant Orders   POCT  Influenza A/B (Completed)   Sore throat       Relevant Orders   POCT rapid strep A (Completed)   Needs flu shot       Fall, initial encounter       Relevant Orders   DG Wrist Complete Right   Right wrist pain       Relevant Orders   DG Wrist Complete Right   Cough       Relevant Orders   DG Chest 2 View   Dyspnea, unspecified type       Relevant Orders   DG Chest 2 View      For sinuses / headache, most consistent with persistent Sinusitis, similar to last visit 2 months ago, that went untreated since she could not afford augmentin and did not follow up or notify us. Has multiple sick contacts as well, constellation of symptoms now seems related to sinus pain and pressure. - RAPID FLU / STREP - NEGATIVE - Rx Amoxicillin 500mg  BID x 14 days - extended course instead of Augmentin - should be on $4 list at [facility], notify us if cannot get rx - Samples and coupon given for Mucinex OTC - Check STAT CXR as well today given her respiratory complaint of occasional dyspnea problem with CPAP, some reduced air movement - Improve hydration - May take PRN meds for headache, treat underlying problem  Fall / Injuries Concern R wrist injury with initial symptoms concerning for possible  fracture, now seems resolving but still painful, other potential injuries L head contusion and L knee among other joints seem to be improved without evidence of complication. Still advised patient she should have been checked out much sooner, whether at our office or at hospital ED. - Check STAT R wrist x-ray rule out fracture - Recommend splint for support vs ACE wrap as she is using - Ice/heat as needed - Follow-up if not improved  DM Advised she needs to contact [facility] Endocrinology regarding toujeo dosing and follow-up with them. If she is ultimately unable to continue following up with Endocrinology, I am not sure what other options we have available for her. As she has failed medical therapy through our office due to non adherence.   Meds ordered this encounter  Medications  . amoxicillin (AMOXIL) 500 MG capsule    Sig: Take 1 capsule (500 mg total) by mouth 2 (two) times daily. For 14 days    Dispense:  28 capsule    Refill:  0     Follow up plan: Return in about 2 weeks (around 05/02/2017), or if symptoms worsen or fail to improve, for sinusitis.  Nobie Putnam, [facility] 04/18/2017, 2:27 PM

## 2017-04-18 NOTE — Patient Instructions (Addendum)
Thank you for coming to the office today.  1. It sounds like you have a Sinusitis (Bacterial Infection) - this most likely started as an Upper Respiratory Virus that has settled into an infection.  - Start Amoxicillin1 pill twice daily (breakfast and dinner, with food and plenty of water) for 14 days, complete entire course, do not stop early even if feeling better -  Mucinex OTC to clear sinuses  - Improve hydration by drinking plenty of clear fluids (water, gatorade) to reduce secretions and thin congestion  - Congestion draining down throat can cause irritation. May try warm herbal tea with honey, cough drops  - Can take Tylenol or Ibuprofen as needed for fevers  Chest X-ray today - stay tuned  R wrist x-ray rule out any fracture  If you develop persistent fever >101F for at least 3 consecutive days, headaches with sinus pain or pressure or persistent earache, please schedule a follow-up evaluation within next few days to week.   Please schedule a Follow-up Appointment to: Return in about 2 weeks (around 05/02/2017), or if symptoms worsen or fail to improve, for sinusitis.    If you have any other questions or concerns, please feel free to call the office or send a message through [place]. You may also schedule an earlier appointment if necessary.  Additionally, you may be receiving a survey about your experience at our office within a few days to 1 week by e-mail or mail. We value your feedback.  Nobie Putnam, DO [place]

## 2017-05-09 ENCOUNTER — Ambulatory Visit
Admission: RE | Admit: 2017-05-09 | Discharge: 2017-05-09 | Disposition: A | Discharge status: Home / Self Care | Payer: Medicare Other | Source: Ambulatory Visit | Attending: Nurse Practitioner | Admitting: Nurse Practitioner

## 2017-05-09 ENCOUNTER — Ambulatory Visit (INDEPENDENT_AMBULATORY_CARE_PROVIDER_SITE_OTHER): Payer: Medicare Other | Admitting: Nurse Practitioner

## 2017-05-09 ENCOUNTER — Encounter: Payer: Self-pay | Admitting: Nurse Practitioner

## 2017-05-09 ENCOUNTER — Other Ambulatory Visit: Payer: Self-pay

## 2017-05-09 VITALS — BP 133/84 | HR 88 | Temp 98.4°F | Ht 63.0 in | Wt 224.6 lb

## 2017-05-09 DIAGNOSIS — M25531 Pain in right wrist: Secondary | ICD-10-CM | POA: Diagnosis not present

## 2017-05-09 DIAGNOSIS — M542 Cervicalgia: Secondary | ICD-10-CM

## 2017-05-09 DIAGNOSIS — M25562 Pain in left knee: Secondary | ICD-10-CM

## 2017-05-09 DIAGNOSIS — S59911A Unspecified injury of right forearm, initial encounter: Secondary | ICD-10-CM | POA: Diagnosis not present

## 2017-05-09 DIAGNOSIS — W19XXXA Unspecified fall, initial encounter: Secondary | ICD-10-CM

## 2017-05-09 DIAGNOSIS — M25561 Pain in right knee: Secondary | ICD-10-CM | POA: Diagnosis not present

## 2017-05-09 DIAGNOSIS — M79631 Pain in right forearm: Secondary | ICD-10-CM | POA: Diagnosis not present

## 2017-05-09 MED ORDER — BACLOFEN 10 MG PO TABS: 10.0000 mg | ORAL_TABLET | Freq: Every day | ORAL | 0 refills | Status: DC

## 2017-05-09 MED ORDER — DICLOFENAC SODIUM 75 MG PO TBEC: 75.0000 mg | DELAYED_RELEASE_TABLET | Freq: Two times a day (BID) | ORAL | 0 refills | Status: AC

## 2017-05-09 NOTE — Patient Instructions (Addendum)
[place], Thank you for coming in to clinic today.  1. You have multiple muscle strain. Likely caused by fall - Start taking Tylenol extra strength 1 to 2 tablets every 6-8 hours for aches or fever/chills for next few days as needed.  Do not take more than 3,000 mg in 24 hours from all medicines.  May take Ibuprofen as well if tolerated 200-400mg  every 8 hours as needed. May alternate tylenol and ibuprofen in same day.  Do not take ibuprofen with diclofenac.  You can start diclofenac 75 mg twice daily for 14 days.  - Use heat and ice.  Apply this for 15 minutes at a time 6-8 times per day.   - Muscle rub with lidocaine, lidocaine patch, Biofreeze, or tiger balm for topical pain relief.  Avoid using this with heat and ice to avoid burns. - START muscle relaxer baclofen 10 mg 1/2 -1 tablet at bedtime.  This can cause drowsiness or dizziness, so use caution.   Please schedule a follow-up appointment with Cassell Smiles, AGNP. Return 1-2 weeks if symptoms worsen or fail to improve.  If you have any other questions or concerns, please feel free to call the clinic or send a message through [place]. You may also schedule an earlier appointment if necessary.  You will receive a survey after today's visit either digitally by e-mail or paper by [employer]. Your experiences and feedback matter to [place].  Please respond so we know how we are doing as we provide care for you.   Cassell Smiles, DNP, AGNP-BC Adult Gerontology Nurse Practitioner [place]

## 2017-05-09 NOTE — Progress Notes (Signed)
Subjective:    Patient ID: Alyssa Perkins, female    DOB: 1950-10-03, 67 y.o.   MRN: [id number]  Alyssa Perkins is a 67 y.o. female presenting on 05/09/2017 for Fall (the pt reports she fell down x 6 days ago and hit her head. She reports getting tangled up in his leash and her feet came out from under her. Injury to the Rt arm with severe pain in the R wrist., bilateral knee pain.)   HPI Fall Wrist injury, bilateral knee pain.  1.5 weeks ago fell down her ramp because her dog pulled on the leash and she lost balance/strength. [place] forward onto her face and head hit gravel.  Had no scratches on her face per pt report.  6 days ago pt fell and hit her head on a kitchen cabinet.  Got tangled in a dog's leash and was tripped.  Has had some dizziness after second fall that has not yet fully resolved.  She also notes having sensation that her "wrist snapped" and her "ankles gave out."  - Denies LOC with both falls.  - Reports ongoing neck and right shoulder pain from hitting her head/shoulder. - Right wrist pain is now worse and is radiating into hand.  It was very swollen, and "grayish colored" for 2-3 days after the fall.  Pt describes persistent "excruciating pain."  Reports pain 9/10 at rest.  - Knee pain: has had chronic knee pain.  Since fall is worsening.  L > R.  When she sits she has to keep legs apart and brace inside of legs.  L knee s/p total joint replacement. - Ankle pain: R ankle "twisted badly."  Does not recall when it was injured (1.5 weeks or 6 days ago).  She reports it continues to hurt.  Wants to go to [facility] Dr. Alvan Dame, [place] as pt reports he did her L knee replacement.  Social History   Tobacco Use  . Smoking status: Former Smoker    Packs/day: 0.50    Years: 15.00    Pack years: 7.50    Types: Cigarettes    Last attempt to quit: 07/17/1994    Years since quitting: 22.8  . Smokeless tobacco: Never Used  . Tobacco comment: stopped 18 years ago   Substance Use Topics  . Alcohol use: No  . Drug use: No    Review of Systems Per HPI unless specifically indicated above     Objective:    BP 133/84 (BP Location: Right Arm, Patient Position: Sitting, Cuff Size: Normal)   Pulse 88   Temp 98.4 F (36.9 C) (Oral)   Ht 5\' 3"  (1.6 m)   Wt 224 lb 9.6 oz (101.9 kg)   BMI 39.79 kg/m   Wt Readings from Last 3 Encounters:  05/09/17 224 lb 9.6 oz (101.9 kg)  04/18/17 224 lb (101.6 kg)  02/21/17 222 lb 3.2 oz (100.8 kg)    Physical Exam  Constitutional: She is oriented to person, place, and time. She appears well-developed and well-nourished. No distress.  HENT:  Head: Normocephalic and atraumatic.  Non-tender to palpation of skull.  No visible edema/lacerations.  Neck: Carotid bruit is not present.  Cardiovascular: Normal rate, regular rhythm, S1 normal, S2 normal, normal heart sounds and intact distal pulses.  Pulmonary/Chest: Effort normal and breath sounds normal. No respiratory distress.  Musculoskeletal:       Right shoulder: Normal.       Left shoulder: Normal.       Right  elbow: Normal.      Left elbow: Normal.       Right wrist: She exhibits decreased range of motion, tenderness, bony tenderness and swelling. She exhibits no effusion, no crepitus, no deformity and no laceration.       Left wrist: Normal.       Right hip: Normal.       Left hip: Normal.       Right knee: She exhibits decreased range of motion and swelling. She exhibits no effusion, no ecchymosis, no erythema, normal alignment, no LCL laxity, normal patellar mobility, no bony tenderness, normal meniscus and no MCL laxity. Tenderness (generalized) found.       Left knee: She exhibits decreased range of motion and swelling. She exhibits no effusion, no deformity, no laceration, no erythema, normal alignment, no LCL laxity, no bony tenderness, normal meniscus and no MCL laxity. Tenderness (generalized) found.       Right ankle: Normal.       Left ankle: Normal.   Neurological: She is alert and oriented to person, place, and time.  Skin: Skin is warm and dry.  Psychiatric: She has a normal mood and affect. Her behavior is normal.  Vitals reviewed.    Results for orders placed or performed in visit on 04/18/17  POCT rapid strep A  Result Value Ref Range   Rapid Strep A Screen Negative Negative  POCT Influenza A/B  Result Value Ref Range   Influenza A, POC Negative Negative   Influenza B, POC Negative Negative      Assessment & Plan:   Problem List Items Addressed This Visit    None    Visit Diagnoses    Fall, initial encounter    -  Primary   Relevant Orders   DG Wrist Complete Right (Completed)   DG Forearm Right (Completed)   Ambulatory referral to Orthopedic Surgery   Right wrist pain       Relevant Medications   diclofenac (VOLTAREN) 75 MG EC tablet   Other Relevant Orders   DG Wrist Complete Right (Completed)   DG Forearm Right (Completed)   Ambulatory referral to Orthopedic Surgery   Acute pain of both knees       Relevant Medications   diclofenac (VOLTAREN) 75 MG EC tablet   Other Relevant Orders   Ambulatory referral to Orthopedic Surgery   Neck pain       Relevant Medications   diclofenac (VOLTAREN) 75 MG EC tablet   baclofen (LIORESAL) 10 MG tablet      Acute fall with injury and subacute fall with injury.  Pt has now fallen x 2 r/t getting tangled in dog leash or by brute force of dog dragging her down.   No acute bony injury is noted.  Pt has multiple contusions and edema.  Has had inadequate conservative therapy to date.  May also have muscle strain.  Plan:  1. Treat with OTC pain meds (acetaminopheb).  Discussed alternate dosing and max dosing with diclofenac. - START diclofenac 75 mg one tablet bid x 15 days then stop. 2. Apply heat and/or ice to affected area. 3. May also apply a muscle rub with lidocaine or lidocaine patch after heat or ice. 4. Take muscle relaxer baclofen 10 mg at bedtime.  Cautioned  drowsiness.  5. Follow up 1-2 weeks if no symptom improvement.   Pt declined PT today.  Meds ordered this encounter  Medications  . diclofenac (VOLTAREN) 75 MG EC tablet    Sig:  Take 1 tablet (75 mg total) by mouth 2 (two) times daily for 15 days.    Dispense:  30 tablet    Refill:  0    Order Specific Question:   Supervising Provider    Answer:   [name] [[id number]]  . baclofen (LIORESAL) 10 MG tablet    Sig: Take 1 tablet (10 mg total) by mouth at bedtime.    Dispense:  14 each    Refill:  0    Order Specific Question:   Supervising Provider    Answer:   [name] [[id number]]    Follow up plan: Return 1-2 weeks if symptoms worsen or fail to improve.  Cassell Smiles, DNP, AGPCNP-BC Adult Gerontology Primary Care Nurse Practitioner [facility] 05/09/2017, 10:23 AM

## 2017-06-10 ENCOUNTER — Encounter: Payer: Self-pay | Admitting: Nurse Practitioner

## 2017-09-02 ENCOUNTER — Emergency Department: Payer: Medicare Other

## 2017-09-02 ENCOUNTER — Other Ambulatory Visit: Payer: Self-pay

## 2017-09-02 ENCOUNTER — Inpatient Hospital Stay
Admission: EM | Admit: 2017-09-02 | Discharge: 2017-09-07 | DRG: 256 | Disposition: A | Discharge status: Home / Self Care | Payer: Medicare Other | Attending: Internal Medicine | Admitting: Internal Medicine

## 2017-09-02 DIAGNOSIS — Z833 Family history of diabetes mellitus: Secondary | ICD-10-CM

## 2017-09-02 DIAGNOSIS — G4700 Insomnia, unspecified: Secondary | ICD-10-CM | POA: Diagnosis present

## 2017-09-02 DIAGNOSIS — IMO0002 Reserved for concepts with insufficient information to code with codable children: Secondary | ICD-10-CM

## 2017-09-02 DIAGNOSIS — L97519 Non-pressure chronic ulcer of other part of right foot with unspecified severity: Secondary | ICD-10-CM | POA: Diagnosis present

## 2017-09-02 DIAGNOSIS — I10 Essential (primary) hypertension: Secondary | ICD-10-CM | POA: Diagnosis present

## 2017-09-02 DIAGNOSIS — E1152 Type 2 diabetes mellitus with diabetic peripheral angiopathy with gangrene: Secondary | ICD-10-CM | POA: Diagnosis not present

## 2017-09-02 DIAGNOSIS — Z79899 Other long term (current) drug therapy: Secondary | ICD-10-CM | POA: Diagnosis not present

## 2017-09-02 DIAGNOSIS — Z96652 Presence of left artificial knee joint: Secondary | ICD-10-CM | POA: Diagnosis present

## 2017-09-02 DIAGNOSIS — E1165 Type 2 diabetes mellitus with hyperglycemia: Secondary | ICD-10-CM | POA: Diagnosis present

## 2017-09-02 DIAGNOSIS — Z794 Long term (current) use of insulin: Secondary | ICD-10-CM | POA: Diagnosis not present

## 2017-09-02 DIAGNOSIS — G4733 Obstructive sleep apnea (adult) (pediatric): Secondary | ICD-10-CM | POA: Diagnosis not present

## 2017-09-02 DIAGNOSIS — Z9111 Patient's noncompliance with dietary regimen: Secondary | ICD-10-CM | POA: Diagnosis not present

## 2017-09-02 DIAGNOSIS — D72829 Elevated white blood cell count, unspecified: Secondary | ICD-10-CM | POA: Diagnosis not present

## 2017-09-02 DIAGNOSIS — M79604 Pain in right leg: Secondary | ICD-10-CM | POA: Diagnosis not present

## 2017-09-02 DIAGNOSIS — E785 Hyperlipidemia, unspecified: Secondary | ICD-10-CM | POA: Diagnosis present

## 2017-09-02 DIAGNOSIS — E1149 Type 2 diabetes mellitus with other diabetic neurological complication: Secondary | ICD-10-CM

## 2017-09-02 DIAGNOSIS — M7989 Other specified soft tissue disorders: Secondary | ICD-10-CM | POA: Diagnosis not present

## 2017-09-02 DIAGNOSIS — L03031 Cellulitis of right toe: Secondary | ICD-10-CM | POA: Diagnosis not present

## 2017-09-02 DIAGNOSIS — K219 Gastro-esophageal reflux disease without esophagitis: Secondary | ICD-10-CM | POA: Diagnosis present

## 2017-09-02 DIAGNOSIS — E11621 Type 2 diabetes mellitus with foot ulcer: Secondary | ICD-10-CM | POA: Diagnosis not present

## 2017-09-02 DIAGNOSIS — E1140 Type 2 diabetes mellitus with diabetic neuropathy, unspecified: Secondary | ICD-10-CM | POA: Diagnosis present

## 2017-09-02 DIAGNOSIS — L97511 Non-pressure chronic ulcer of other part of right foot limited to breakdown of skin: Secondary | ICD-10-CM | POA: Diagnosis not present

## 2017-09-02 DIAGNOSIS — L02611 Cutaneous abscess of right foot: Secondary | ICD-10-CM | POA: Diagnosis present

## 2017-09-02 DIAGNOSIS — F329 Major depressive disorder, single episode, unspecified: Secondary | ICD-10-CM | POA: Diagnosis present

## 2017-09-02 DIAGNOSIS — B9562 Methicillin resistant Staphylococcus aureus infection as the cause of diseases classified elsewhere: Secondary | ICD-10-CM | POA: Diagnosis present

## 2017-09-02 DIAGNOSIS — L03115 Cellulitis of right lower limb: Secondary | ICD-10-CM | POA: Diagnosis present

## 2017-09-02 DIAGNOSIS — Z885 Allergy status to narcotic agent status: Secondary | ICD-10-CM

## 2017-09-02 DIAGNOSIS — I96 Gangrene, not elsewhere classified: Secondary | ICD-10-CM | POA: Diagnosis not present

## 2017-09-02 DIAGNOSIS — F418 Other specified anxiety disorders: Secondary | ICD-10-CM | POA: Diagnosis not present

## 2017-09-02 DIAGNOSIS — G2581 Restless legs syndrome: Secondary | ICD-10-CM | POA: Diagnosis present

## 2017-09-02 DIAGNOSIS — M79674 Pain in right toe(s): Secondary | ICD-10-CM | POA: Diagnosis not present

## 2017-09-02 DIAGNOSIS — J45909 Unspecified asthma, uncomplicated: Secondary | ICD-10-CM | POA: Diagnosis not present

## 2017-09-02 DIAGNOSIS — Z87891 Personal history of nicotine dependence: Secondary | ICD-10-CM | POA: Diagnosis not present

## 2017-09-02 LAB — CBC WITH DIFFERENTIAL/PLATELET
BASOS ABS: 0.1 10*3/uL (ref 0–0.1)
BASOS PCT: 1 %
EOS ABS: 0.3 10*3/uL (ref 0–0.7)
EOS PCT: 2 %
HCT: 44.5 % (ref 35.0–47.0)
Hemoglobin: 14.8 g/dL (ref 12.0–16.0)
LYMPHS PCT: 15 %
Lymphs Abs: 2.6 10*3/uL (ref 1.0–3.6)
MCH: 27.1 pg (ref 26.0–34.0)
MCHC: 33.3 g/dL (ref 32.0–36.0)
MCV: 81.5 fL (ref 80.0–100.0)
MONO ABS: 0.8 10*3/uL (ref 0.2–0.9)
Monocytes Relative: 5 %
Neutro Abs: 13.6 10*3/uL — ABNORMAL HIGH (ref 1.4–6.5)
Neutrophils Relative %: 77 %
PLATELETS: 280 10*3/uL (ref 150–440)
RBC: 5.47 MIL/uL — AB (ref 3.80–5.20)
RDW: 14.7 % — AB (ref 11.5–14.5)
WBC: 17.5 10*3/uL — AB (ref 3.6–11.0)

## 2017-09-02 LAB — COMPREHENSIVE METABOLIC PANEL
ALBUMIN: 3.9 g/dL (ref 3.5–5.0)
ALK PHOS: 126 U/L (ref 38–126)
ALT: 16 U/L (ref 14–54)
AST: 24 U/L (ref 15–41)
Anion gap: 10 (ref 5–15)
BILIRUBIN TOTAL: 1 mg/dL (ref 0.3–1.2)
BUN: 11 mg/dL (ref 6–20)
CALCIUM: 8.9 mg/dL (ref 8.9–10.3)
CO2: 25 mmol/L (ref 22–32)
CREATININE: 0.94 mg/dL (ref 0.44–1.00)
Chloride: 103 mmol/L (ref 101–111)
GFR calc Af Amer: >60 mL/min (ref >60)
GLUCOSE: 248 mg/dL — AB (ref 65–99)
Potassium: 4.2 mmol/L (ref 3.5–5.1)
Sodium: 138 mmol/L (ref 135–145)
TOTAL PROTEIN: 7.9 g/dL (ref 6.5–8.1)

## 2017-09-02 LAB — GLUCOSE, CAPILLARY
Glucose-Capillary: 196 mg/dL — ABNORMAL HIGH (ref 65–99)
Glucose-Capillary: 261 mg/dL — ABNORMAL HIGH (ref 65–99)

## 2017-09-02 LAB — HEMOGLOBIN A1C
HEMOGLOBIN A1C: 10.9 % — AB (ref 4.8–5.6)
MEAN PLASMA GLUCOSE: 266.13 mg/dL

## 2017-09-02 MED ORDER — QUINAPRIL HCL 10 MG PO TABS
20.0000 mg | ORAL_TABLET | Freq: Every day | ORAL | Status: DC
  Administered 2017-09-02 – 2017-09-06 (×5): 20 mg via ORAL
  Filled 2017-09-02 (×6): qty 2

## 2017-09-02 MED ORDER — INSULIN ASPART 100 UNIT/ML ~~LOC~~ SOLN
0.0000 [IU] | Freq: Three times a day (TID) | SUBCUTANEOUS | Status: DC
  Administered 2017-09-02 – 2017-09-03 (×2): 2 [IU] via SUBCUTANEOUS
  Administered 2017-09-03: 5 [IU] via SUBCUTANEOUS
  Filled 2017-09-02 (×3): qty 1

## 2017-09-02 MED ORDER — ACETAMINOPHEN 325 MG PO TABS
650.0000 mg | ORAL_TABLET | Freq: Four times a day (QID) | ORAL | Status: DC | PRN
  Administered 2017-09-04: 650 mg via ORAL
  Filled 2017-09-02: qty 2

## 2017-09-02 MED ORDER — OXYCODONE-ACETAMINOPHEN 5-325 MG PO TABS
1.0000 | ORAL_TABLET | Freq: Four times a day (QID) | ORAL | Status: DC | PRN
Start: 2017-09-02 — End: 2017-09-05
  Administered 2017-09-02: 2 via ORAL
  Administered 2017-09-03: 1 via ORAL
  Administered 2017-09-03: 2 via ORAL
  Administered 2017-09-03: 1 via ORAL
  Administered 2017-09-03: 2 via ORAL
  Administered 2017-09-04 – 2017-09-05 (×2): 1 via ORAL
  Filled 2017-09-02 (×3): qty 1
  Filled 2017-09-02 (×3): qty 2
  Filled 2017-09-02: qty 1

## 2017-09-02 MED ORDER — SODIUM CHLORIDE 0.9 % IV SOLN
3.0000 g | Freq: Four times a day (QID) | INTRAVENOUS | Status: DC
  Administered 2017-09-02 – 2017-09-05 (×11): 3 g via INTRAVENOUS
  Filled 2017-09-02 (×14): qty 3

## 2017-09-02 MED ORDER — ONDANSETRON HCL 4 MG/2ML IJ SOLN
4.0000 mg | Freq: Once | INTRAMUSCULAR | Status: AC
  Administered 2017-09-02: 4 mg via INTRAVENOUS
  Filled 2017-09-02: qty 2

## 2017-09-02 MED ORDER — ONDANSETRON HCL 4 MG PO TABS
4.0000 mg | ORAL_TABLET | Freq: Four times a day (QID) | ORAL | Status: DC | PRN
  Administered 2017-09-02: 4 mg via ORAL
  Filled 2017-09-02: qty 1

## 2017-09-02 MED ORDER — FENTANYL CITRATE (PF) 100 MCG/2ML IJ SOLN
50.0000 ug | Freq: Once | INTRAMUSCULAR | Status: AC
  Administered 2017-09-02: 50 ug via INTRAVENOUS
  Filled 2017-09-02: qty 2

## 2017-09-02 MED ORDER — ENOXAPARIN SODIUM 40 MG/0.4ML ~~LOC~~ SOLN
40.0000 mg | SUBCUTANEOUS | Status: DC
  Administered 2017-09-02 – 2017-09-06 (×5): 40 mg via SUBCUTANEOUS
  Filled 2017-09-02 (×5): qty 0.4

## 2017-09-02 MED ORDER — GABAPENTIN 400 MG PO CAPS
400.0000 mg | ORAL_CAPSULE | Freq: Every day | ORAL | Status: DC
  Administered 2017-09-02 – 2017-09-06 (×5): 400 mg via ORAL
  Filled 2017-09-02 (×5): qty 1

## 2017-09-02 MED ORDER — ACETAMINOPHEN 650 MG RE SUPP: 650.0000 mg | Freq: Four times a day (QID) | RECTAL | Status: DC | PRN

## 2017-09-02 MED ORDER — SIMVASTATIN 20 MG PO TABS
40.0000 mg | ORAL_TABLET | Freq: Every day | ORAL | Status: DC
  Administered 2017-09-02 – 2017-09-06 (×5): 40 mg via ORAL
  Filled 2017-09-02 (×5): qty 2

## 2017-09-02 MED ORDER — PANTOPRAZOLE SODIUM 40 MG PO TBEC
40.0000 mg | DELAYED_RELEASE_TABLET | Freq: Every day | ORAL | Status: DC
  Administered 2017-09-02 – 2017-09-07 (×6): 40 mg via ORAL
  Filled 2017-09-02 (×7): qty 1

## 2017-09-02 MED ORDER — PIPERACILLIN-TAZOBACTAM 3.375 G IVPB: 3.3750 g | Freq: Three times a day (TID) | INTRAVENOUS | Status: DC

## 2017-09-02 MED ORDER — POLYETHYLENE GLYCOL 3350 17 G PO PACK
17.0000 g | PACK | Freq: Every day | ORAL | Status: DC | PRN
  Administered 2017-09-04 – 2017-09-05 (×2): 17 g via ORAL
  Filled 2017-09-02 (×2): qty 1

## 2017-09-02 MED ORDER — SODIUM CHLORIDE 0.9 % IV BOLUS
500.0000 mL | Freq: Once | INTRAVENOUS | Status: AC
  Administered 2017-09-02: 500 mL via INTRAVENOUS

## 2017-09-02 MED ORDER — HYDROXYZINE HCL 25 MG PO TABS
25.0000 mg | ORAL_TABLET | Freq: Every day | ORAL | Status: DC | PRN
  Administered 2017-09-03 – 2017-09-06 (×4): 25 mg via ORAL
  Filled 2017-09-02 (×5): qty 1

## 2017-09-02 MED ORDER — MUPIROCIN CALCIUM 2 % EX CREA
TOPICAL_CREAM | Freq: Every day | CUTANEOUS | Status: DC
  Administered 2017-09-02 – 2017-09-07 (×6): via TOPICAL
  Filled 2017-09-02: qty 15

## 2017-09-02 MED ORDER — INSULIN GLARGINE 100 UNIT/ML ~~LOC~~ SOLN
78.0000 [IU] | Freq: Every day | SUBCUTANEOUS | Status: DC
Start: 2017-09-03 — End: 2017-09-07
  Administered 2017-09-03 – 2017-09-07 (×5): 78 [IU] via SUBCUTANEOUS
  Filled 2017-09-02 (×5): qty 0.78

## 2017-09-02 MED ORDER — SENNA 8.6 MG PO TABS
1.0000 | ORAL_TABLET | Freq: Two times a day (BID) | ORAL | Status: DC
  Administered 2017-09-03 – 2017-09-07 (×9): 8.6 mg via ORAL
  Filled 2017-09-02 (×10): qty 1

## 2017-09-02 MED ORDER — ONDANSETRON HCL 4 MG/2ML IJ SOLN
4.0000 mg | Freq: Four times a day (QID) | INTRAMUSCULAR | Status: DC | PRN
  Administered 2017-09-03: 4 mg via INTRAVENOUS
  Filled 2017-09-02: qty 2

## 2017-09-02 MED ORDER — SERTRALINE HCL 50 MG PO TABS
50.0000 mg | ORAL_TABLET | Freq: Every day | ORAL | Status: DC
  Administered 2017-09-02 – 2017-09-07 (×6): 50 mg via ORAL
  Filled 2017-09-02 (×6): qty 1

## 2017-09-02 MED ORDER — VANCOMYCIN HCL IN DEXTROSE 1-5 GM/200ML-% IV SOLN
1000.0000 mg | Freq: Once | INTRAVENOUS | Status: DC
  Filled 2017-09-02: qty 200

## 2017-09-02 MED ORDER — PIPERACILLIN-TAZOBACTAM 3.375 G IVPB 30 MIN
3.3750 g | Freq: Once | INTRAVENOUS | Status: DC
  Administered 2017-09-02: 3.375 g via INTRAVENOUS
  Filled 2017-09-02: qty 50

## 2017-09-02 NOTE — H&P (Signed)
[facility] NAME: Alyssa Perkins    MR#:  [id number]  DATE OF BIRTH:  09-03-1950  DATE OF ADMISSION:  09/02/2017  PRIMARY CARE PHYSICIAN: [name], DO   REQUESTING/REFERRING PHYSICIAN: Dr Clearnce Hasten  CHIEF COMPLAINT:   Right second toe pain and change in color since Sunday HISTORY OF PRESENT ILLNESS:  Alyssa Perkins  is a 67 y.o. female with a known RLS, HL,, uncontrolled diabetes, depression comes to the emergency room after she noticed her right second toe turning black discharge from the tip of the toe. Patient states she started hurting her second toe a few days ago she buddy taped her toe and removed the tape after couple days and found her right second toe turn black with some discharge and surrounding cellulitis. She denies trauma to the toe. Seed IV vancomycin and Zosyn in the ER denies any fever   Xray of the foot shows soft tissue swelling. No evidence of osteomyelitis no foreign object.  PAST MEDICAL HISTORY:   Past Medical History:  Diagnosis Date  . Anginal pain (HCC)   . Anxiety    not on any medication at this time  . Arthritis   . Asthma    "sports asthma or exercised induced"seen at [facility] for primary  . Chronic back pain   . Chronic neck pain   . Complication of anesthesia    "difficulty waking up from surgery"  . Depression    has OCD, states counts constantly  . GERD (gastroesophageal reflux disease)   . Headache(784.0)    hx of migraines  . Hyperlipidemia   . IBS (irritable bowel syndrome)   . PONV (postoperative nausea and vomiting)   . RLS (restless legs syndrome)   . Sleep apnea   . Thyroid disease     PAST SURGICAL HISTOIRY:   Past Surgical History:  Procedure Laterality Date  . ABDOMINAL HYSTERECTOMY     also had oophorectomy  . ANTERIOR CERVICAL DECOMP/DISCECTOMY FUSION  01/02/2012   Procedure: ANTERIOR CERVICAL  DECOMPRESSION/DISCECTOMY FUSION 2 LEVELS;  Surgeon: Dahari Brooks, MD;  Location: [facility] OR;  Service: Orthopedics;  Laterality: N/A;  ACDF C5-7  . BILATERAL CARPAL TUNNEL RELEASE    . CARDIAC CATHETERIZATION     done approx. 4- 5 years ago, Dr. Harold Kernodle, does not see cardi. at this time.  . CHOLECYSTECTOMY    . DILATION AND CURETTAGE OF UTERUS     "several"  . HERNIA REPAIR    . KNEE SURGERY     1 partial left, 1 total left  . SPINE SURGERY      SOCIAL HISTORY:   Social History   Tobacco Use  . Smoking status: Former Smoker    Packs/day: 0.50    Years: 15.00    Pack years: 7.50    Types: Cigarettes    Last attempt to quit: 07/17/1994    Years since quitting: 23.1  . Smokeless tobacco: Never Used  . Tobacco comment: stopped 18 years ago  Substance Use Topics  . Alcohol use: No    FAMILY HISTORY:   Family History  Problem Relation Age of Onset  . Heart disease Mother   . Osteoporosis Mother   . Breast cancer Mother        dx with ovarian ca 1st  . Ovarian cancer Mother        50 's  . Osteoporosis Sister   . Diabetes Sister   .  Breast cancer Sister        early 35's no braca testing   . Cancer Brother        sinus, bone  . Osteoporosis Brother     DRUG ALLERGIES:   Allergies  Allergen Reactions  . Codeine Itching    REVIEW OF SYSTEMS:  Review of Systems  Constitutional: Negative for chills, fever and weight loss.  HENT: Negative for ear discharge, ear pain and nosebleeds.   Eyes: Negative for blurred vision, pain and discharge.  Respiratory: Negative for sputum production, shortness of breath, wheezing and stridor.   Cardiovascular: Negative for chest pain, palpitations, orthopnea and PND.  Gastrointestinal: Negative for abdominal pain, diarrhea, nausea and vomiting.  Genitourinary: Negative for frequency and urgency.  Musculoskeletal: Positive for joint pain. Negative for back pain.  Neurological: Negative for sensory change, speech change, focal  weakness and weakness.  Psychiatric/Behavioral: Negative for depression and hallucinations. The patient is not nervous/anxious.      MEDICATIONS AT HOME:   Prior to Admission medications   Medication Sig Start Date End Date Taking? Authorizing Provider  Elastic Bandages & Supports (MEDICAL COMPRESSION STOCKINGS) MISC 1 each by Does not apply route daily. 11/14/15  Yes Krebs, Amy Lauren, NP  gabapentin (NEURONTIN) 100 MG capsule Take 4 capsules in AM and 4 capsules in PM Patient taking differently: Take 400 mg by mouth at bedtime.  06/06/16  Yes Karamalegos, Devonne Doughty, DO  glucose blood (FREESTYLE TEST STRIPS) test strip Use as instructed 03/29/16  Yes Karamalegos, Devonne Doughty, DO  glucose monitoring kit (FREESTYLE) monitoring kit 1 each by Does not apply route as needed for other. CHeck blood glucose once daily before insulin injections. For ICD 10 E11.65 11/14/15  Yes Krebs, Genevie Cheshire, NP  hydrOXYzine (ATARAX/VISTARIL) 25 MG tablet Take 1 tablet (25 mg total) by mouth daily as needed for itching. For itching 12/05/16  Yes Karamalegos, Devonne Doughty, DO  ibuprofen (ADVIL,MOTRIN) 200 MG tablet Take 600-800 mg by mouth every 6 (six) hours as needed for moderate pain.   Yes [provider]  Insulin Glargine (TOUJEO SOLOSTAR) 300 UNIT/ML SOPN Inject 52 Units into the skin daily. Adjust dose as advised Patient taking differently: Inject 78 Units into the skin daily.  02/17/17  Yes Karamalegos, Devonne Doughty, DO  Insulin Pen Needle 31G X 5 MM MISC 1 each by Does not apply route daily. Use to inject Toujeo daily. Dx:E11.9 11/28/15  Yes Krebs, Genevie Cheshire, NP  Lancets (FREESTYLE) lancets Use as instructed 03/29/16  Yes Karamalegos, Devonne Doughty, DO  omeprazole (PRILOSEC) 20 MG capsule Take 1 capsule (20 mg total) by mouth daily. 12/05/16  Yes Karamalegos, Devonne Doughty, DO  quinapril (ACCUPRIL) 20 MG tablet Take 1 tablet (20 mg total) by mouth at bedtime. 12/05/16  Yes Karamalegos, Devonne Doughty, DO  sertraline  (ZOLOFT) 50 MG tablet Take 1 tablet (50 mg total) by mouth daily. 12/05/16  Yes Karamalegos, Devonne Doughty, DO  simvastatin (ZOCOR) 40 MG tablet Take 1 tablet (40 mg total) by mouth at bedtime. Reported on 05/16/2015 12/05/16  Yes Karamalegos, Devonne Doughty, DO  baclofen (LIORESAL) 10 MG tablet Take 1 tablet (10 mg total) by mouth at bedtime. Patient not taking: Reported on 09/02/2017 05/09/17   Mikey College, NP  traMADol (ULTRAM) 50 MG tablet Take 1-2 tablets (50-100 mg total) by mouth every 8 (eight) hours as needed. Patient not taking: Reported on 09/02/2017 02/21/17   [name], DO  traZODone (DESYREL) 50 MG tablet Take 1-2  tablets (50-100 mg total) by mouth at bedtime. Start 1 tab and if not helping by 2 to 4 weeks you can increase to 2 pills bedtime Patient not taking: Reported on 05/09/2017 01/15/17   [name], DO      VITAL SIGNS:  Blood pressure (!) 142/72, pulse 84, temperature 97.9 F (36.6 C), temperature source Oral, resp. rate 18, height 5' 3"  (1.6 m), weight 97.1 kg (214 lb), SpO2 98 %.  PHYSICAL EXAMINATION:  GENERAL:  67 y.o.-year-old patient lying in the bed with no acute distress.  EYES: Pupils equal, round, reactive to light and accommodation. No scleral icterus. Extraocular muscles intact.  HEENT: Head atraumatic, normocephalic. Oropharynx and nasopharynx clear.  NECK:  Supple, no jugular venous distention. No thyroid enlargement, no tenderness.  LUNGS: Normal breath sounds bilaterally, no wheezing, rales,rhonchi or crepitation. No use of accessory muscles of respiration.  CARDIOVASCULAR: S1, S2 normal. No murmurs, rubs, or gallops.  ABDOMEN: Soft, nontender, nondistended. Bowel sounds present. No organomegaly or mass.  EXTREMITIES: No pedal edema, cyanosis, or clubbing. Right second to gangrenous with clear seizures discharge from the tip of the toe. Surrounding cellulitis marked with the marking pen. NEUROLOGIC: Cranial nerves II through XII  are intact. Muscle strength 5/5 in all extremities. Sensation intact. Gait not checked.  PSYCHIATRIC: The patient is alert and oriented x 3.  SKIN: No obvious rash, lesion, or ulcer.   LABORATORY PANEL:   CBC Recent Labs  Lab 09/02/17 1250  WBC 17.5*  HGB 14.8  HCT 44.5  PLT 280   ------------------------------------------------------------------------------------------------------------------  Chemistries  Recent Labs  Lab 09/02/17 1250  NA 138  K 4.2  CL 103  CO2 25  GLUCOSE 248*  BUN 11  CREATININE 0.94  CALCIUM 8.9  AST 24  ALT 16  ALKPHOS 126  BILITOT 1.0   ------------------------------------------------------------------------------------------------------------------  Cardiac Enzymes No results for input(s): TROPONINI in the last 168 hours. ------------------------------------------------------------------------------------------------------------------  RADIOLOGY:  Dg Foot Complete Right  Result Date: 09/02/2017 CLINICAL DATA:  Swollen right second toe with no known injury. There is a bruised appearance and there was some oozing. History of diabetes. EXAM: RIGHT FOOT COMPLETE - 3+ VIEW COMPARISON:  Right foot series dated July 01, 2010 FINDINGS: There is diffuse soft tissue swelling of the second toe. There is mild cutaneous irregularity of the lateral aspect of the tip of the toe. The joint spaces are well maintained. There are no bony changes to suggest osteomyelitis. There is mild soft tissue swelling of the forefoot. IMPRESSION: Soft tissue swelling diffusely and superficial disruption of the tip of the second toe suggesting an open wound which may be the site of drainage. Findings likely reflect cellulitis. No radiographic evidence of osteomyelitis. Electronically Signed   By: David  Martinique M.D.   On: 09/02/2017 13:43    EKG:    IMPRESSION AND PLAN:   Olar Santini  is a 67 y.o. female with a known RLS, HL,, uncontrolled diabetes, depression comes  to the emergency room after she noticed her right second toe turning black discharge from the tip of the toe  1. right second toe gangrene/cellulitis with infection -admit to medical floor -IV unasyn -podiatry consultation. Discussed with Dr. Cleda Mccreedy -vascular consultation given gangrene  looking toe -MRI right foot  To be ordred by [place] if needed  2. Uncontrolled DM-2 -continue insulin and sliding scale -diabetes coordinator consult -check A1c  3. Hyperlipidemia continue statins  4. Jerrye Bushy continue PPI  5. DVT prophylaxis subcu Lovenox  With patient's  daughter in the room  All the records are reviewed and case discussed with ED provider. Management plans discussed with the patient, family and they are in agreement.  CODE STATUS: full  TOTAL TIME TAKING CARE OF THIS PATIENT: *50* minutes.    Fritzi Mandes M.D on 09/02/2017 at 2:59 PM  Between 7am to 6pm - Pager - [phone]  After 6pm go to [email or web address] - password [email or web address] [facility]  Office  [phone]  CC: Primary care physician; [name], DO

## 2017-09-02 NOTE — Consult Note (Signed)
[facility] Consult Note  MRN : [id number]  Alyssa Perkins is a 67 y.o. ([birth year]) female who presents with chief complaint of  Chief Complaint  Patient presents with  . Toe Injury  .  History of Present Illness:   I am asked to evaluate the patient by Dr Fritzi Mandes.  The patient is a 67 year old woman who  Presented to the [facility] ER earlier today with increasing pain of the right foot.  She states she noticed her right second toe turning black with discharge from the tip of the toe this am. Patient states she traumatized her second toe a few days ago and thought it might be broken so she buddy taped her toe.  This morning she removed the tape and found her right second toe turn black with some discharge and surrounding cellulitis.  She was started on IV vancomycin and Zosyn in the ER  She denies fever or chills.  No past history of claudication.  No prior infections. No prior vascular investigations or treatments.   Xray of the foot shows soft tissue swelling. No evidence of osteomyelitis no foreign object.    Current Facility-Administered Medications  Medication Dose Route Frequency Provider Last Rate Last Dose  . acetaminophen (TYLENOL) tablet 650 mg  650 mg Oral Q6H PRN Fritzi Mandes, MD       Or  . acetaminophen (TYLENOL) suppository 650 mg  650 mg Rectal Q6H PRN Fritzi Mandes, MD      . Ampicillin-Sulbactam (UNASYN) 3 g in sodium chloride 0.9 % 100 mL IVPB  3 g Intravenous Q6H Fritzi Mandes, MD 200 mL/hr at 09/02/17 1525 3 g at 09/02/17 1525  . enoxaparin (LOVENOX) injection 40 mg  40 mg Subcutaneous Q24H Fritzi Mandes, MD      . gabapentin (NEURONTIN) capsule 400 mg  400 mg Oral QHS Fritzi Mandes, MD      . hydrOXYzine (ATARAX/VISTARIL) tablet 25 mg  25 mg Oral Daily PRN Fritzi Mandes, MD      . insulin aspart (novoLOG) injection 0-9 Units  0-9 Units Subcutaneous TID WC Fritzi Mandes, MD   2 Units at 09/02/17 1834  . [START ON 09/03/2017] insulin  glargine (LANTUS) injection 78 Units  78 Units Subcutaneous Daily Fritzi Mandes, MD      . mupirocin cream (BACTROBAN) 2 %   Topical Daily Sharlotte Alamo, DPM      . ondansetron [facility]) tablet 4 mg  4 mg Oral Q6H PRN Fritzi Mandes, MD   4 mg at 09/02/17 1834   Or  . ondansetron (ZOFRAN) injection 4 mg  4 mg Intravenous Q6H PRN Fritzi Mandes, MD      . oxyCODONE-acetaminophen (PERCOCET/ROXICET) 5-325 MG per tablet 1-2 tablet  1-2 tablet Oral Q6H PRN Fritzi Mandes, MD   2 tablet at 09/02/17 1638  . pantoprazole (PROTONIX) EC tablet 40 mg  40 mg Oral Daily Fritzi Mandes, MD   40 mg at 09/02/17 1841  . polyethylene glycol (MIRALAX / GLYCOLAX) packet 17 g  17 g Oral Daily PRN Fritzi Mandes, MD      . quinapril (ACCUPRIL) tablet 20 mg  20 mg Oral QHS Fritzi Mandes, MD      . senna [facility]) tablet 8.6 mg  1 tablet Oral BID Fritzi Mandes, MD      . sertraline (ZOLOFT) tablet 50 mg  50 mg Oral Daily Fritzi Mandes, MD   50 mg at 09/02/17 1841  . simvastatin (ZOCOR) tablet 40 mg  40 mg Oral QHS Fritzi Mandes, MD        Past Medical History:  Diagnosis Date  . Anginal pain ([place])   . Anxiety    not on any medication at this time  . Arthritis   . Asthma    "sports asthma or exercised induced"seen at [place] for primary  . Chronic back pain   . Chronic neck pain   . Complication of anesthesia    "difficulty waking up from surgery"  . Depression    has OCD, states counts constantly  . GERD (gastroesophageal reflux disease)   . Headache(784.0)    hx of migraines  . Hyperlipidemia   . IBS (irritable bowel syndrome)   . PONV (postoperative nausea and vomiting)   . RLS (restless legs syndrome)   . Sleep apnea   . Thyroid disease     Past Surgical History:  Procedure Laterality Date  . ABDOMINAL HYSTERECTOMY     also had oophorectomy  . ANTERIOR CERVICAL DECOMP/DISCECTOMY FUSION  01/02/2012   Procedure: ANTERIOR CERVICAL DECOMPRESSION/DISCECTOMY FUSION 2 LEVELS;  Surgeon: Melina Schools, MD;   Location: [place];  Service: Orthopedics;  Laterality: N/A;  ACDF C5-7  . BILATERAL CARPAL TUNNEL RELEASE    . CARDIAC CATHETERIZATION     done approx. 4- 5 years ago, Dr. Leanor Kail, does not see cardi. at this time.  . CHOLECYSTECTOMY    . DILATION AND CURETTAGE OF UTERUS     "several"  . HERNIA REPAIR    . KNEE SURGERY     1 partial left, 1 total left  . SPINE SURGERY      Social History Social History   Tobacco Use  . Smoking status: Former Smoker    Packs/day: 0.50    Years: 15.00    Pack years: 7.50    Types: Cigarettes    Last attempt to quit: 07/17/1994    Years since quitting: 23.1  . Smokeless tobacco: Never Used  . Tobacco comment: stopped 18 years ago  Substance Use Topics  . Alcohol use: No  . Drug use: No    Family History Family History  Problem Relation Age of Onset  . Heart disease Mother   . Osteoporosis Mother   . Breast cancer Mother        dx with ovarian ca 1st  . Ovarian cancer Mother        90's  . Osteoporosis Sister   . Diabetes Sister   . Breast cancer Sister        early 60's no braca testing   . Cancer Brother        sinus, bone  . Osteoporosis Brother   No family history of bleeding/clotting disorders, porphyria or autoimmune disease   No Active Allergies   REVIEW OF SYSTEMS (Negative unless checked)  Constitutional: [] Weight loss  [] Fever  [] Chills Cardiac: [] Chest pain   [] Chest pressure   [] Palpitations   [] Shortness of breath when laying flat   [] Shortness of breath at rest   [] Shortness of breath with exertion. Vascular:  [] Pain in legs with walking   [x] Pain in legs at rest   [] Pain in legs when laying flat   [] Claudication   [] Pain in feet when walking  [] Pain in feet at rest  [x] Pain in feet when laying flat   [] History of DVT   [] Phlebitis   [x] Swelling in legs   [] Varicose veins   [] Non-healing ulcers Pulmonary:   [] Uses home oxygen   []   Productive cough   [] Hemoptysis   [] Wheeze  [] COPD   [] Asthma Neurologic:   [] Dizziness  [] Blackouts   [] Seizures   [] History of stroke   [] History of TIA  [] Aphasia   [] Temporary blindness   [] Dysphagia   [] Weakness or numbness in arms   [] Weakness or numbness in legs Musculoskeletal:  [] Arthritis   [] Joint swelling   [] Joint pain   [] Low back pain Hematologic:  [] Easy bruising  [] Easy bleeding   [] Hypercoagulable state   [] Anemic  [] Hepatitis Gastrointestinal:  [] Blood in stool   [] Vomiting blood  [] Gastroesophageal reflux/heartburn   [] Difficulty swallowing. Genitourinary:  [] Chronic kidney disease   [] Difficult urination  [] Frequent urination  [] Burning with urination   [] Blood in urine Skin:  [] Rashes   [x] Ulcers   [x] Wounds Psychological:  [] History of anxiety   []  History of major depression.   Physical Examination  Vitals:   09/02/17 1248 09/02/17 1422 09/02/17 1530 09/02/17 1559  BP: 126/73 (!) 142/72 124/70 123/77  Pulse: 99 84 77 75  Resp: 18   16  Temp: 99.7 F (37.6 C) 97.9 F (36.6 C)  97.8 F (36.6 C)  TempSrc: Oral Oral  Oral  SpO2: 95% 98% 95% 100%  Weight: 214 lb (97.1 kg)     Height: 5\' 3"  (1.6 m)      Body mass index is 37.91 kg/m.  Head: [place]/AT, No temporalis wasting.  Ear/Nose/Throat: Nares w/o erythema or drainage,  Mallampati score: 4.  Dentition poor.  Eyes: PERRLA, Sclera nonicteric.  Neck: Supple, no nuchal rigidity.  No JVD.  Pulmonary:  No audible wheezing, no use of accessory muscles.  Cardiac: RRR, . Vascular:  2+ DP and PT bilaterally, 3+ edema with erythremia of  Gastrointestinal: soft, non-tender, non-distended.  Musculoskeletal: Moves all extremities.  No deformity or atrophy. No edema. Neurologic: CN 2-12 intact. Symmetrical.  Speech is fluent.  Psychiatric: Judgment intact, Mood & affect appropriate for pt's clinical situation. Dermatologic: No rashes or ulcers noted. + cellulitis with open wounds. Lymph : No Cervical,  or Inguinal lymphadenopathy.      CBC Lab Results  Component Value Date   WBC 17.5 (H)  09/02/2017   HGB 14.8 09/02/2017   HCT 44.5 09/02/2017   MCV 81.5 09/02/2017   PLT 280 09/02/2017    BMET    Component Value Date/Time   NA 138 09/02/2017 1250   NA 142 02/26/2013 0930   K 4.2 09/02/2017 1250   K 4.2 02/26/2013 0930   CL 103 09/02/2017 1250   CL 108 (H) 02/26/2013 0930   CO2 25 09/02/2017 1250   CO2 31 02/26/2013 0930   GLUCOSE 248 (H) 09/02/2017 1250   GLUCOSE 109 (H) 02/26/2013 0930   BUN 11 09/02/2017 1250   BUN 15 02/26/2013 0930   CREATININE 0.94 09/02/2017 1250   CREATININE 0.87 10/15/2016 1126   CALCIUM 8.9 09/02/2017 1250   CALCIUM 9.0 02/26/2013 0930   GFRNONAA >60 09/02/2017 1250   GFRNONAA 70 10/15/2016 1126   GFRAA >60 09/02/2017 1250   GFRAA 81 10/15/2016 1126   Estimated Creatinine Clearance: 65.3 mL/min (by C-G formula based on SCr of 0.94 mg/dL).  COAG Lab Results  Component Value Date   INR 0.93 11/07/2010      Assessment/Plan 1. right second toe gangrene/cellulitis with infection 1. IV unasyn 2.  The patient has palpable pedal pulses both PT and DP.  Given this I would hold on angiogram for now but would reconsider if wound healing is problematic. 3.  podiatry consultation. Discussed with Dr. Cleda Mccreedy 4.  MRI right foot pending  2. Uncontrolled DM-2 1. continue insulin and sliding scale 2. diabetes coordinator consult 3. check A1c  3. Hyperlipidemia  1.  continue statins  4. Gerd  1.  continue PPI   Hortencia Pilar, MD  09/02/2017 8:31 PM

## 2017-09-02 NOTE — Progress Notes (Signed)
Family Meeting Note  Advance Directive:no Today a meeting took place with the pt and family   The following were discussed:Patient's diagnosis: patient is being admitted with uncontrolled diabetes and right toe gangrene/infection., Patient's prognosis stable discuss code status patient request full code  Time spent during discussion: 60 Fritzi Mandes, MD

## 2017-09-02 NOTE — ED Triage Notes (Signed)
Pt noticed that on Saturday R 2nd toe was causing pt to limp, still has feeling in feet. Took sock off and noticed the tip was black. Pt denies known injury but states "I'm always stumbling anyhow." top of toe is black, draining ulcer to end of toe. Top of R foot is red about half way back foot. Alert, oriented. In wheelchair. Hx type 2 DM.

## 2017-09-02 NOTE — Consult Note (Signed)
Reason for Consult: Infection right second toe with gangrenous changes. Referring Physician: Dolores Perkins is an 67 y.o. female.  HPI: This is a 67 year old female with poorly controlled diabetes who relates having a sore on the tip of her right second toe couple of days ago.  States that she taped the toe and left this in place for couple of days and when she removed she noticed a significant discoloration and blister on her second toe with significant pain.  Presented to the emergency department where she was admitted for IV antibiotics and evaluation.  Past Medical History:  Diagnosis Date  . Anginal pain ([place])   . Anxiety    not on any medication at this time  . Arthritis   . Asthma    "sports asthma or exercised induced"seen at [place] for primary  . Chronic back pain   . Chronic neck pain   . Complication of anesthesia    "difficulty waking up from surgery"  . Depression    has OCD, states counts constantly  . GERD (gastroesophageal reflux disease)   . Headache(784.0)    hx of migraines  . Hyperlipidemia   . IBS (irritable bowel syndrome)   . PONV (postoperative nausea and vomiting)   . RLS (restless legs syndrome)   . Sleep apnea   . Thyroid disease     Past Surgical History:  Procedure Laterality Date  . ABDOMINAL HYSTERECTOMY     also had oophorectomy  . ANTERIOR CERVICAL DECOMP/DISCECTOMY FUSION  01/02/2012   Procedure: ANTERIOR CERVICAL DECOMPRESSION/DISCECTOMY FUSION 2 LEVELS;  Surgeon: Melina Schools, MD;  Location: [place];  Service: Orthopedics;  Laterality: N/A;  ACDF C5-7  . BILATERAL CARPAL TUNNEL RELEASE    . CARDIAC CATHETERIZATION     done approx. 4- 5 years ago, Dr. Leanor Kail, does not see cardi. at this time.  . CHOLECYSTECTOMY    . DILATION AND CURETTAGE OF UTERUS     "several"  . HERNIA REPAIR    . KNEE SURGERY     1 partial left, 1 total left  . SPINE SURGERY      Family History  Problem Relation Age of  Onset  . Heart disease Mother   . Osteoporosis Mother   . Breast cancer Mother        dx with ovarian ca 1st  . Ovarian cancer Mother        28's  . Osteoporosis Sister   . Diabetes Sister   . Breast cancer Sister        early 51's no braca testing   . Cancer Brother        sinus, bone  . Osteoporosis Brother     Social History:  reports that she quit smoking about 23 years ago. Her smoking use included cigarettes. She has a 7.50 pack-year smoking history. She has never used smokeless tobacco. She reports that she does not drink alcohol or use drugs.  Allergies: No Active Allergies  Medications:  Scheduled: . enoxaparin (LOVENOX) injection  40 mg Subcutaneous Q24H  . gabapentin  400 mg Oral QHS  . insulin aspart  0-9 Units Subcutaneous TID WC  . [START ON 09/03/2017] insulin glargine  78 Units Subcutaneous Daily  . mupirocin cream   Topical Daily  . pantoprazole  40 mg Oral Daily  . quinapril  20 mg Oral QHS  . senna  1 tablet Oral BID  . sertraline  50 mg Oral Daily  . simvastatin  40 mg Oral QHS    Results for orders placed or performed during the hospital encounter of 09/02/17 (from the past 48 hour(s))  Comprehensive metabolic panel     Status: Abnormal   Collection Time: 09/02/17 12:50 PM  Result Value Ref Range   Sodium 138 135 - 145 mmol/L   Potassium 4.2 3.5 - 5.1 mmol/L   Chloride 103 101 - 111 mmol/L   CO2 25 22 - 32 mmol/L   Glucose, Bld 248 (H) 65 - 99 mg/dL   BUN 11 6 - 20 mg/dL   Creatinine, Ser 0.94 0.44 - 1.00 mg/dL   Calcium 8.9 8.9 - 10.3 mg/dL   Total Protein 7.9 6.5 - 8.1 g/dL   Albumin 3.9 3.5 - 5.0 g/dL   AST 24 15 - 41 U/L   ALT 16 14 - 54 U/L   Alkaline Phosphatase 126 38 - 126 U/L   Total Bilirubin 1.0 0.3 - 1.2 mg/dL   GFR calc non Af Amer >60 >60 mL/min   GFR calc Af Amer >60 >60 mL/min    Comment: (NOTE) The eGFR has been calculated using the CKD EPI equation. This calculation has not been validated in all clinical situations. eGFR's  persistently <60 mL/min signify possible Chronic Kidney Disease.    Anion gap 10 5 - 15    Comment: Performed at [facility], [address] [place], [place] [address]  CBC with Differential     Status: Abnormal   Collection Time: 09/02/17 12:50 PM  Result Value Ref Range   WBC 17.5 (H) 3.6 - 11.0 K/uL   RBC 5.47 (H) 3.80 - 5.20 MIL/uL   Hemoglobin 14.8 12.0 - 16.0 g/dL   HCT 44.5 35.0 - 47.0 %   MCV 81.5 80.0 - 100.0 fL   MCH 27.1 26.0 - 34.0 pg   MCHC 33.3 32.0 - 36.0 g/dL   RDW 14.7 (H) 11.5 - 14.5 %   Platelets 280 150 - 440 K/uL   Neutrophils Relative % 77 %   Neutro Abs 13.6 (H) 1.4 - 6.5 K/uL   Lymphocytes Relative 15 %   Lymphs Abs 2.6 1.0 - 3.6 K/uL   Monocytes Relative 5 %   Monocytes Absolute 0.8 0.2 - 0.9 K/uL   Eosinophils Relative 2 %   Eosinophils Absolute 0.3 0 - 0.7 K/uL   Basophils Relative 1 %   Basophils Absolute 0.1 0 - 0.1 K/uL    Comment: Performed at [facility], [address] [place],  [address]  Glucose, capillary     Status: Abnormal   Collection Time: 09/02/17  5:24 PM  Result Value Ref Range   Glucose-Capillary 196 (H) 65 - 99 mg/dL    Dg Foot Complete Right  Result Date: 09/02/2017 CLINICAL DATA:  Swollen right second toe with no known injury. There is a bruised appearance and there was some oozing. History of diabetes. EXAM: RIGHT FOOT COMPLETE - 3+ VIEW COMPARISON:  Right foot series dated July 01, 2010 FINDINGS: There is diffuse soft tissue swelling of the second toe. There is mild cutaneous irregularity of the lateral aspect of the tip of the toe. The joint spaces are well maintained. There are no bony changes to suggest osteomyelitis. There is mild soft tissue swelling of the forefoot. IMPRESSION: Soft tissue swelling diffusely and superficial disruption of the tip of the second toe suggesting an open wound which may be the site of drainage. Findings likely reflect cellulitis. No radiographic evidence of  osteomyelitis.  Electronically Signed   By: David  Martinique M.D.   On: 09/02/2017 13:43    Review of Systems  Constitutional: Negative for chills and fever.  HENT: Negative.   Eyes: Negative.   Respiratory: Negative.   Cardiovascular: Negative.   Gastrointestinal: Negative for nausea and vomiting.  Genitourinary: Negative.   Musculoskeletal:       Significant pain with her right second toe.  Skin:       Recent discoloration with blistering on her right second toe.  Has had a little drainage.  Also relates some swelling and redness in her foot.  This does get worse when she stands on the foot.  Neurological:       Denies any numbness or paresthesias related to her diabetes  Endo/Heme/Allergies: Negative.   Psychiatric/Behavioral: Negative.    Blood pressure 123/77, pulse 75, temperature 97.8 F (36.6 C), temperature source Oral, resp. rate 16, height 5' 3"  (1.6 m), weight 97.1 kg (214 lb), SpO2 100 %. Physical Exam  Cardiovascular:  DP and PT pulses are fully palpable.  Musculoskeletal:  Significant pain in the right second toe and forefoot with guarding.  Otherwise appears to be adequate range of motion.  Muscle testing deferred.  Neurological:  Protective threshold with a monofilament wire intact and symmetric to the digits.  Proprioception intact.  Skin:  The skin is warm dry and supple.  There is some significant edema and erythema in the right forefoot with a hemorrhagic and purulent blister on the dorsum and the lateral aspect of the second toe.  Upon debridement of the blister there is superficial ulceration of the entire dorsal and medial aspect of the toe extending onto the plantar surface with some more fibrotic tissue distally at the tip of the toe.  No clear deep extension.    Assessment/Plan: Assessment: 1.  Infected blister with ulceration right second toe. 2.  Poorly controlled diabetes.  Plan: The roof the blister on the right second toe and a culture was taken for  sensitivities.  Debrided all of the devitalized tissue including the ulcerative area at the distal tip.  Bactroban and a sterile gauze bandage applied.  Discussed with the patient that at this point it appears to be mostly superficial and I do not think there is a need for MRI at this time.  Does not appear to be specifically gangrenous at this point either.  Also do not know that she will need any vascular intervention with her palpable pulses but will defer to vascular surgery.  Discussed with the patient that no guarantees could be given and if the toe does not respond we may need to consider MRI.  Discussed the possibility of amputation of the toe if there does appear to be deeper infection.  We will follow her closely during her hospital stay.  Durward Fortes 09/02/2017, 8:08 PM

## 2017-09-02 NOTE — Plan of Care (Signed)
  Problem: Education: Goal: Knowledge of General Education information will improve Outcome: Progressing   Problem: Health Behavior/Discharge Planning: Goal: Ability to manage health-related needs will improve Outcome: Progressing   Problem: Clinical Measurements: Goal: Ability to maintain clinical measurements within normal limits will improve Outcome: Progressing Goal: Will remain free from infection Outcome: Progressing Goal: Diagnostic test results will improve Outcome: Progressing Goal: Respiratory complications will improve Outcome: Progressing Goal: Cardiovascular complication will be avoided Outcome: Progressing   Problem: Activity: Goal: Risk for activity intolerance will decrease Outcome: Progressing   Problem: Nutrition: Goal: Adequate nutrition will be maintained Outcome: Progressing   Problem: Coping: Goal: Level of anxiety will decrease Outcome: Progressing   Problem: Elimination: Goal: Will not experience complications related to bowel motility Outcome: Progressing Goal: Will not experience complications related to urinary retention Outcome: Progressing   Problem: Pain Managment: Goal: General experience of comfort will improve Outcome: Progressing   Problem: Safety: Goal: Ability to remain free from injury will improve Outcome: Progressing   Problem: Skin Integrity: Goal: Risk for impaired skin integrity will decrease Outcome: Progressing   Problem: Clinical Measurements: Goal: Ability to avoid or minimize complications of infection will improve Outcome: Progressing   Problem: Skin Integrity: Goal: Skin integrity will improve Outcome: Progressing

## 2017-09-02 NOTE — ED Provider Notes (Signed)
[facility] Emergency Department Provider Note ____________________________________________   First MD Initiated Contact with Patient 09/02/17 1439     (approximate)  I have reviewed the triage vital signs and the nursing notes.   HISTORY  Chief Complaint Toe Injury  HPI Alyssa Perkins is a 67 y.o. female with a history of diabetes who is presenting to the emergency department with right second toe swelling as well as pain.  She says that she noticed a black spot on the tip of her right second toe this past Saturday.  She says that she then bandaged it when she took off the bandage she found that there was a lot of pus and blood and that the toe had become purple and swollen with redness extending up the foot.  Past Medical History:  Diagnosis Date  . Anginal pain ([place])   . Anxiety    not on any medication at this time  . Arthritis   . Asthma    "sports asthma or exercised induced"seen at [place] for primary  . Chronic back pain   . Chronic neck pain   . Complication of anesthesia    "difficulty waking up from surgery"  . Depression    has OCD, states counts constantly  . GERD (gastroesophageal reflux disease)   . Headache(784.0)    hx of migraines  . Hyperlipidemia   . IBS (irritable bowel syndrome)   . PONV (postoperative nausea and vomiting)   . RLS (restless legs syndrome)   . Sleep apnea   . Thyroid disease     Patient Active Problem List   Diagnosis Date Noted  . Gangrene associated with diabetes mellitus ([place]) 09/02/2017  . Diabetic retinopathy ([place]) 02/26/2017  . Insomnia 02/21/2017  . Chronic headaches 02/21/2017  . OSA on CPAP 10/21/2016  . Depression 10/15/2016  . Chronic back pain 09/11/2016  . Chronic neck pain 09/11/2016  . RLS (restless legs syndrome) 07/08/2016  . Bilateral carpal tunnel syndrome 03/26/2016  . De Quervain's tenosynovitis, left 11/14/2015  . DM (diabetes mellitus), type 2,  uncontrolled w/neurologic complication ([place]) 03/26/3233  . Hyperlipidemia associated with type 2 diabetes mellitus ([place]) 09/16/2014  . Hypertension 09/16/2014  . Acid reflux 09/16/2014  . Cervical radiculopathy 12/30/2011    Past Surgical History:  Procedure Laterality Date  . ABDOMINAL HYSTERECTOMY     also had oophorectomy  . ANTERIOR CERVICAL DECOMP/DISCECTOMY FUSION  01/02/2012   Procedure: ANTERIOR CERVICAL DECOMPRESSION/DISCECTOMY FUSION 2 LEVELS;  Surgeon: Melina Schools, MD;  Location: [place];  Service: Orthopedics;  Laterality: N/A;  ACDF C5-7  . BILATERAL CARPAL TUNNEL RELEASE    . CARDIAC CATHETERIZATION     done approx. 4- 5 years ago, Dr. Leanor Kail, does not see cardi. at this time.  . CHOLECYSTECTOMY    . DILATION AND CURETTAGE OF UTERUS     "several"  . HERNIA REPAIR    . KNEE SURGERY     1 partial left, 1 total left  . SPINE SURGERY      Prior to Admission medications   Medication Sig Start Date End Date Taking? Authorizing Provider  doxylamine, Sleep, (UNISOM) 25 MG tablet Take 25 mg by mouth at bedtime as needed for sleep.   Yes [provider]  Elastic Bandages & Supports (MEDICAL COMPRESSION STOCKINGS) [place] 1 each by Does not apply route daily. 11/14/15  Yes Krebs, Amy Lauren, NP  gabapentin (NEURONTIN) 100 MG capsule Take 4 capsules in AM and 4 capsules  in PM Patient taking differently: Take 400 mg by mouth at bedtime.  06/06/16  Yes Karamalegos, Devonne Doughty, DO  glucose blood (FREESTYLE TEST STRIPS) test strip Use as instructed 03/29/16  Yes Karamalegos, Devonne Doughty, DO  glucose monitoring kit (FREESTYLE) monitoring kit 1 each by Does not apply route as needed for other. CHeck blood glucose once daily before insulin injections. For ICD 10 E11.65 11/14/15  Yes Krebs, Genevie Cheshire, NP  hydrOXYzine (ATARAX/VISTARIL) 25 MG tablet Take 1 tablet (25 mg total) by mouth daily as needed for itching. For itching 12/05/16  Yes Karamalegos, Devonne Doughty, DO  ibuprofen  (ADVIL,MOTRIN) 200 MG tablet Take 600-800 mg by mouth every 6 (six) hours as needed for moderate pain.   Yes [provider]  Insulin Glargine (TOUJEO SOLOSTAR) 300 UNIT/ML SOPN Inject 52 Units into the skin daily. Adjust dose as advised Patient taking differently: Inject 78 Units into the skin daily.  02/17/17  Yes Karamalegos, Devonne Doughty, DO  Insulin Pen Needle 31G X 5 MM MISC 1 each by Does not apply route daily. Use to inject Toujeo daily. Dx:E11.9 11/28/15  Yes Krebs, Genevie Cheshire, NP  Lancets (FREESTYLE) lancets Use as instructed 03/29/16  Yes Karamalegos, Devonne Doughty, DO  omeprazole (PRILOSEC) 20 MG capsule Take 1 capsule (20 mg total) by mouth daily. 12/05/16  Yes Karamalegos, Devonne Doughty, DO  quinapril (ACCUPRIL) 20 MG tablet Take 1 tablet (20 mg total) by mouth at bedtime. 12/05/16  Yes Karamalegos, Devonne Doughty, DO  sertraline (ZOLOFT) 50 MG tablet Take 1 tablet (50 mg total) by mouth daily. 12/05/16  Yes Karamalegos, Devonne Doughty, DO  simvastatin (ZOCOR) 40 MG tablet Take 1 tablet (40 mg total) by mouth at bedtime. Reported on 05/16/2015 12/05/16  Yes Karamalegos, Devonne Doughty, DO  baclofen (LIORESAL) 10 MG tablet Take 1 tablet (10 mg total) by mouth at bedtime. Patient not taking: Reported on 09/02/2017 05/09/17   Mikey College, NP  traMADol (ULTRAM) 50 MG tablet Take 1-2 tablets (50-100 mg total) by mouth every 8 (eight) hours as needed. Patient not taking: Reported on 09/02/2017 02/21/17   [name], DO  traZODone (DESYREL) 50 MG tablet Take 1-2 tablets (50-100 mg total) by mouth at bedtime. Start 1 tab and if not helping by 2 to 4 weeks you can increase to 2 pills bedtime Patient not taking: Reported on 05/09/2017 01/15/17   [name], DO    Allergies Codeine  Family History  Problem Relation Age of Onset  . Heart disease Mother   . Osteoporosis Mother   . Breast cancer Mother        dx with ovarian ca 1st  . Ovarian cancer Mother        52's    . Osteoporosis Sister   . Diabetes Sister   . Breast cancer Sister        early 17's no braca testing   . Cancer Brother        sinus, bone  . Osteoporosis Brother     Social History Social History   Tobacco Use  . Smoking status: Former Smoker    Packs/day: 0.50    Years: 15.00    Pack years: 7.50    Types: Cigarettes    Last attempt to quit: 07/17/1994    Years since quitting: 23.1  . Smokeless tobacco: Never Used  . Tobacco comment: stopped 18 years ago  Substance Use Topics  . Alcohol use: No  . Drug use: No  Review of Systems  Constitutional: No fever/chills Eyes: No visual changes. ENT: No sore throat. Cardiovascular: Denies chest pain. Respiratory: Denies shortness of breath. Gastrointestinal: No abdominal pain.  No nausea, no vomiting.  No diarrhea.  No constipation. Genitourinary: Negative for dysuria. Musculoskeletal: Negative for back pain. Skin: Negative for rash. Neurological: Negative for headaches, focal weakness or numbness.   ____________________________________________   PHYSICAL EXAM:  VITAL SIGNS: ED Triage Vitals [09/02/17 1248]  Enc Vitals Group     BP 126/73     Pulse Rate 99     Resp 18     Temp 99.7 F (37.6 C)     Temp Source Oral     SpO2 95 %     Weight 214 lb (97.1 kg)     Height _0  (1.6 m)     Head Circumference      Peak Flow      Pain Score 10     Pain Loc      Pain Edu?      Excl. in [place]?     Constitutional: Alert and oriented. Well appearing and in no acute distress. Eyes: Conjunctivae are normal.  Head: Atraumatic. Nose: No congestion/rhinnorhea. Mouth/Throat: Mucous membranes are moist.  Neck: No stridor.   Cardiovascular: Normal rate, regular rhythm. Grossly normal heart sounds.  Respiratory: Normal respiratory effort.  No retractions. Lungs CTAB. Gastrointestinal: Soft and nontender. No distention. No CVA tenderness. Musculoskeletal:   Right second toe with pus that is able to be expressed from the  distal tip.  The toe is diffusely swollen and tender.  On the dorsum of the toe there is a hemorrhagic bulla extending from the DIP up to the MCP J.  Erythema extending from the proximal portion of the toe up to the ankle and there is streaking redness that extends onto the anterior leg.  Neurologic:  Normal speech and language. No gross focal neurologic deficits are appreciated. Skin: As above Psychiatric: Mood and affect are normal. Speech and behavior are normal.  ____________________________________________   LABS (all labs ordered are listed, but only abnormal results are displayed)  Labs Reviewed  COMPREHENSIVE METABOLIC PANEL - Abnormal; Notable for the following components:      Result Value   Glucose, Bld 248 (*)    All other components within normal limits  CBC WITH DIFFERENTIAL/PLATELET - Abnormal; Notable for the following components:   WBC 17.5 (*)    RBC 5.47 (*)    RDW 14.7 (*)    Neutro Abs 13.6 (*)    All other components within normal limits  CULTURE, BLOOD (ROUTINE X 2)  CULTURE, BLOOD (ROUTINE X 2)  HEMOGLOBIN A1C   ____________________________________________  EKG   ____________________________________________  RADIOLOGY  Soft tissue swelling diffusely and superficial disruption of the tip of the second toe suggesting an open wound.  No evidence for osteomyelitis. ____________________________________________   PROCEDURES  Procedure(s) performed:   Procedures  Critical Care performed:   ____________________________________________   INITIAL IMPRESSION / ASSESSMENT AND PLAN / ED COURSE  Pertinent labs & imaging results that were available during my care of the patient were reviewed by me and considered in my medical decision making (see chart for details).  DDX: Cellulitis, osteomyelitis, abscess, flexor tendon infection, bacteremia As part of my medical decision making, I reviewed the following data within the electronic MEDICAL RECORD NUMBER  Notes from prior ED visits  ----------------------------------------- 3:23 PM on 09/02/2017 -----------------------------------------  Patient has been admitted to the hospitalist who discussed the  case with podiatry who will be evaluating the patient.  Patient aware of need for admission to the hospital.  Says that her pain is been controlled with fentanyl at this time. ____________________________________________   FINAL CLINICAL IMPRESSION(S) / ED DIAGNOSES  Final diagnoses:  Cellulitis of right lower extremity      NEW MEDICATIONS STARTED DURING THIS VISIT:  New Prescriptions   No medications on file     Note:  This document was prepared using Dragon voice recognition software and may include unintentional dictation errors.     Orbie Pyo, MD 09/02/17 [phone]

## 2017-09-03 LAB — GLUCOSE, CAPILLARY
GLUCOSE-CAPILLARY: 175 mg/dL — AB (ref 65–99)
GLUCOSE-CAPILLARY: 120 mg/dL — AB (ref 65–99)
Glucose-Capillary: 199 mg/dL — ABNORMAL HIGH (ref 65–99)
Glucose-Capillary: 288 mg/dL — ABNORMAL HIGH (ref 65–99)

## 2017-09-03 MED ORDER — INSULIN ASPART 100 UNIT/ML ~~LOC~~ SOLN
0.0000 [IU] | Freq: Every day | SUBCUTANEOUS | Status: DC
  Administered 2017-09-06: 2 [IU] via SUBCUTANEOUS
  Filled 2017-09-03: qty 1

## 2017-09-03 MED ORDER — HYDROXYZINE HCL 25 MG PO TABS
25.0000 mg | ORAL_TABLET | Freq: Once | ORAL | Status: AC
  Administered 2017-09-04: 25 mg via ORAL
  Filled 2017-09-03: qty 1

## 2017-09-03 MED ORDER — INSULIN ASPART 100 UNIT/ML ~~LOC~~ SOLN
0.0000 [IU] | Freq: Three times a day (TID) | SUBCUTANEOUS | Status: DC
  Administered 2017-09-03 – 2017-09-04 (×2): 2 [IU] via SUBCUTANEOUS
  Administered 2017-09-04: 1 [IU] via SUBCUTANEOUS
  Administered 2017-09-05 (×2): 3 [IU] via SUBCUTANEOUS
  Administered 2017-09-06: 5 [IU] via SUBCUTANEOUS
  Administered 2017-09-06: 9 [IU] via SUBCUTANEOUS
  Administered 2017-09-06: 3 [IU] via SUBCUTANEOUS
  Administered 2017-09-07 (×2): 2 [IU] via SUBCUTANEOUS
  Filled 2017-09-03 (×10): qty 1

## 2017-09-03 NOTE — Progress Notes (Signed)
Alyssa Perkins at [facility] NAME: Alyssa Perkins    MR#:  [id number]  DATE OF BIRTH:  1951-01-15  SUBJECTIVE:  CHIEF COMPLAINT:   Chief Complaint  Patient presents with  . Toe Injury   -Came in with right foot second toe trauma followed by gangrene and cellulitis. -Superficial debridement done by podiatry at bedside.  On IV antibiotics  REVIEW OF SYSTEMS:  Review of Systems  Constitutional: Negative for chills, fever and malaise/fatigue.  HENT: Negative for hearing loss.   Eyes: Negative for blurred vision and double vision.  Respiratory: Negative for cough, shortness of breath and wheezing.   Cardiovascular: Negative for chest pain, palpitations and leg swelling.  Gastrointestinal: Negative for abdominal pain, constipation, diarrhea, nausea and vomiting.  Genitourinary: Negative for dysuria.  Musculoskeletal: Positive for joint pain and myalgias. Negative for falls.  Neurological: Negative for dizziness, focal weakness, seizures, weakness and headaches.  Psychiatric/Behavioral: Negative for depression.    DRUG ALLERGIES:  No Active Allergies  VITALS:  Blood pressure (!) 101/53, pulse 73, temperature 98.2 F (36.8 C), temperature source Oral, resp. rate 18, height 5\' 3"  (1.6 m), weight 97.1 kg (214 lb), SpO2 100 %.  PHYSICAL EXAMINATION:  Physical Exam   GENERAL:  67 y.o.-year-old patient lying in the bed with no acute distress.  EYES: Pupils equal, round, reactive to light and accommodation. No scleral icterus. Extraocular muscles intact.  HEENT: Head atraumatic, normocephalic. Oropharynx and nasopharynx clear.  NECK:  Supple, no jugular venous distention. No thyroid enlargement, no tenderness.  LUNGS: Normal breath sounds bilaterally, no wheezing, rales,rhonchi or crepitation. No use of accessory muscles of respiration.  CARDIOVASCULAR: S1, S2 normal. No murmurs, rubs, or gallops.  ABDOMEN: Soft, nontender, nondistended. Bowel  sounds present. No organomegaly or mass.  EXTREMITIES: right foot with improved erythema and swelling, debrided superficial skin on second toe. No pedal edema, cyanosis, or clubbing.  NEUROLOGIC: Cranial nerves II through XII are intact. Muscle strength 5/5 in all extremities. Sensation intact. Gait not checked.  PSYCHIATRIC: The patient is alert and oriented x 3.  SKIN: No obvious rash, lesion, or ulcer.    LABORATORY PANEL:   CBC Recent Labs  Lab 09/02/17 1250  WBC 17.5*  HGB 14.8  HCT 44.5  PLT 280   ------------------------------------------------------------------------------------------------------------------  Chemistries  Recent Labs  Lab 09/02/17 1250  NA 138  K 4.2  CL 103  CO2 25  GLUCOSE 248*  BUN 11  CREATININE 0.94  CALCIUM 8.9  AST 24  ALT 16  ALKPHOS 126  BILITOT 1.0   ------------------------------------------------------------------------------------------------------------------  Cardiac Enzymes No results for input(s): TROPONINI in the last 168 hours. ------------------------------------------------------------------------------------------------------------------  RADIOLOGY:  Dg Foot Complete Right  Result Date: 09/02/2017 CLINICAL DATA:  Swollen right second toe with no known injury. There is a bruised appearance and there was some oozing. History of diabetes. EXAM: RIGHT FOOT COMPLETE - 3+ VIEW COMPARISON:  Right foot series dated July 01, 2010 FINDINGS: There is diffuse soft tissue swelling of the second toe. There is mild cutaneous irregularity of the lateral aspect of the tip of the toe. The joint spaces are well maintained. There are no bony changes to suggest osteomyelitis. There is mild soft tissue swelling of the forefoot. IMPRESSION: Soft tissue swelling diffusely and superficial disruption of the tip of the second toe suggesting an open wound which may be the site of drainage. Findings likely reflect cellulitis. No radiographic evidence  of osteomyelitis. Electronically Signed   By: Shanon Brow  [place] M.D.   On: 09/02/2017 13:43    EKG:   Orders placed or performed during the hospital encounter of 10/01/15  . ED EKG within 10 minutes  . ED EKG within 10 minutes  . EKG    ASSESSMENT AND PLAN:   67 year old female with poorly controlled diabetes mellitus, arthritis, IBS, sleep apnea, GERD and chronic back pain presents to hospital secondary to worsening right foot swelling and right toe gangrene.  1.  Right foot cellulitis with right second toe gangrene/infection -Appreciate podiatry and vascular consults. -Patient has good pedal pulses, so likely might not need an angiogram. -Continue IV Unasyn.  Since improvement is noted, might not need an MRI. -Follow-up on cultures.  Continue dressing changes for now  2.  Uncontrolled diabetes mellitus-A1c of 10 -Restarted Lantus and also on sliding scale insulin  3.  GERD-on Protonix  4.  Hypertension-on quinapril  5.  DVT prophylaxis-Lovenox    All the records are reviewed and case discussed with Care Management/Social Workerr. Management plans discussed with the patient, family and they are in agreement.  CODE STATUS: Full Code  TOTAL TIME TAKING CARE OF THIS PATIENT: 38 minutes.   POSSIBLE D/C IN 2-3 DAYS, DEPENDING ON CLINICAL CONDITION.   Gladstone Lighter M.D on 09/03/2017 at 12:52 PM  Between 7am to 6pm - Pager - [phone]  After 6pm go to [email or web address] - password [email or web address] [facility]  Office  [phone]  CC: Primary care physician; [name], DO

## 2017-09-03 NOTE — Progress Notes (Signed)
Inpatient Diabetes Program Recommendations  AACE/ADA: New Consensus Statement on Inpatient Glycemic Control (2015)  Target Ranges:  Prepandial:   less than 140 mg/dL      Peak postprandial:   less than 180 mg/dL (1-2 hours)      Critically ill patients:  140 - 180 mg/dL   Results for SHALONA, HARBOUR (MRN [id number]) as of 09/03/2017 10:11  Ref. Range 09/02/2017 17:24 09/02/2017 21:04 09/03/2017 07:46  Glucose-Capillary Latest Ref Range: 65 - 99 mg/dL 196 (H) 261 (H) 199 (H)   Results for KELLA, SPLINTER (MRN [id number]) as of 09/03/2017 10:11  Ref. Range 10/15/2016 11:08 09/02/2017 16:33  Hemoglobin A1C Latest Ref Range: 4.8 - 5.6 % 13.8 (A) 10.9 (H)  Average glucose 266 mg/dl    Home DM Meds: Toujeo 78 units daily  Current Orders: Lantus 78 units daily     Novolog Sensitive Correction Scale/ SSI (0-9 units) TID AC     Endocrinologist: Dr. Joneen Caraway with [facility]-- Last seen 01/15/2017  At that visit, Dr. Joneen Caraway proposed adding GLP-1 agent to home diabetes regimen.  PCP: Dr. Parks Ranger with [facility]  Note Lantus to start this AM.  Current A1c of 10.9% shows suboptimal glucose control at home.     --Will follow patient during hospitalization--  Wyn Quaker RN, MSN, CDE Diabetes Coordinator Inpatient Glycemic Control Team Team Pager: [phone] (8a-5p)

## 2017-09-03 NOTE — Progress Notes (Addendum)
Met with pt today (sister at bedside).  Spoke with patient about her current A1c of 10.9%.  Explained what an A1c is and what it measures.  Reminded patient that her goal A1c is 7% or less per ADA standards to prevent both acute and long-term complications.  Explained to patient the extreme importance of good glucose control at home.  Encouraged patient to check her CBGs at least bid at home (fasting and another check within the day) and to record all CBGs in a logbook for her PCP and Endocrinologist to review.  Patient stated she had a follow up appt with her ENDO doctor this past week, but she missed her appt b/c her daughter forgot about the appt and didn't take her.  Patient was very distracted during our conversation and told me she didn't like her CBG meter, she didn't like the insulin she had to take at home, and she doesn't eat well at home.  Told me she used to take Lantus and that controlled her CBGs well, however, when the doctor switched her to Toujeo the insulin stopped working.  Only checking CBGs once daily but states she doesn't always check once daily.  Doesn't eat much for breakfast or lunch and sister states pt's daughter brings her fast food for dinner.  Patient stated she was frustrated but didn't really want to do anything about it anyways.  Strongly encouraged pt to check her CBGs more often and strongly encouraged pt to make a follow up appt with Endocrinology Dr. Joneen Caraway.  Explained to pt that she may need a more intensified insulin regimen at home, however, unsure pt would be willing to take 4 shots of insulin a day at home.     --Will follow patient during hospitalization--  Wyn Quaker RN, MSN, CDE Diabetes Coordinator Inpatient Glycemic Control Team Team Pager: [phone] (8a-5p)

## 2017-09-03 NOTE — Progress Notes (Signed)
Pt c/o itching. Notifed MD. Received order to give an additional dose of vistaril

## 2017-09-03 NOTE — Progress Notes (Signed)
   Subjective/Chief Complaint: Patient seen.  Still having significant pain in the right foot and second toe.   Objective: Vital signs in last 24 hours: Temp:  [97.8 F (36.6 C)-98.2 F (36.8 C)] 98.2 F (36.8 C) (06/19 0815) Pulse Rate:  [73-84] 73 (06/19 0815) Resp:  [16-19] 18 (06/19 0815) BP: (94-142)/(53-77) 101/53 (06/19 0815) SpO2:  [95 %-100 %] 100 % (06/19 0815) Last BM Date: 09/02/17  Intake/Output from previous day: 06/18 0701 - 06/19 0700 In: 1766.7 [IV Piggyback:1766.7] Out: -  Intake/Output this shift: No intake/output data recorded.  Minimal drainage is noted on the bandaging on the right second toe.  Upon removal still some discolored fibrotic type tissue dorsally with red granular tissue lateral and plantar as well as some areas around the periphery dorsally.  Erythema and edema has improved since last night.  Lab Results:  Recent Labs    09/02/17 1250  WBC 17.5*  HGB 14.8  HCT 44.5  PLT 280   BMET Recent Labs    09/02/17 1250  NA 138  K 4.2  CL 103  CO2 25  GLUCOSE 248*  BUN 11  CREATININE 0.94  CALCIUM 8.9   PT/INR No results for input(s): LABPROT, INR in the last 72 hours. ABG No results for input(s): PHART, HCO3 in the last 72 hours.  Invalid input(s): PCO2, PO2  Studies/Results: Dg Foot Complete Right  Result Date: 09/02/2017 CLINICAL DATA:  Swollen right second toe with no known injury. There is a bruised appearance and there was some oozing. History of diabetes. EXAM: RIGHT FOOT COMPLETE - 3+ VIEW COMPARISON:  Right foot series dated July 01, 2010 FINDINGS: There is diffuse soft tissue swelling of the second toe. There is mild cutaneous irregularity of the lateral aspect of the tip of the toe. The joint spaces are well maintained. There are no bony changes to suggest osteomyelitis. There is mild soft tissue swelling of the forefoot. IMPRESSION: Soft tissue swelling diffusely and superficial disruption of the tip of the second toe  suggesting an open wound which may be the site of drainage. Findings likely reflect cellulitis. No radiographic evidence of osteomyelitis. Electronically Signed   By: David  [place] M.D.   On: 09/02/2017 13:43    Anti-infectives: Anti-infectives (From admission, onward)   Start     Dose/Rate Route Frequency Ordered Stop   09/02/17 1800  piperacillin-tazobactam (ZOSYN) IVPB 3.375 g  Status:  Discontinued     3.375 g 12.5 mL/hr over 240 Minutes Intravenous Every 8 hours 09/02/17 1447 09/02/17 1451   09/02/17 1500  Ampicillin-Sulbactam (UNASYN) 3 g in sodium chloride 0.9 % 100 mL IVPB     3 g 200 mL/hr over 30 Minutes Intravenous Every 6 hours 09/02/17 1451     09/02/17 1415  piperacillin-tazobactam (ZOSYN) IVPB 3.375 g  Status:  Discontinued     3.375 g 100 mL/hr over 30 Minutes Intravenous  Once 09/02/17 1405 09/02/17 1513   09/02/17 1415  vancomycin (VANCOCIN) IVPB 1000 mg/200 mL premix  Status:  Discontinued     1,000 mg 200 mL/hr over 60 Minutes Intravenous  Once 09/02/17 1405 09/02/17 1451      Assessment/Plan: s/p * No surgery found * Assessment: Cellulitis with abscess right second toe status post debridement.   Plan: Bactroban cream and a sterile gauze bandage reapplied.  We will redraw her blood work tomorrow and reevaluate the toe as well tomorrow.  LOS: 1 day    Durward Fortes 09/03/2017

## 2017-09-04 ENCOUNTER — Inpatient Hospital Stay: Payer: Medicare Other

## 2017-09-04 LAB — BASIC METABOLIC PANEL
ANION GAP: 9 (ref 5–15)
BUN: 16 mg/dL (ref 6–20)
CALCIUM: 8.3 mg/dL — AB (ref 8.9–10.3)
CO2: 28 mmol/L (ref 22–32)
Chloride: 103 mmol/L (ref 101–111)
Creatinine, Ser: 1.03 mg/dL — ABNORMAL HIGH (ref 0.44–1.00)
GFR calc Af Amer: >60 mL/min (ref >60)
GFR, EST NON AFRICAN AMERICAN: 55 mL/min — AB (ref >60)
GLUCOSE: 99 mg/dL (ref 65–99)
Potassium: 3.7 mmol/L (ref 3.5–5.1)
Sodium: 140 mmol/L (ref 135–145)

## 2017-09-04 LAB — URINALYSIS, ROUTINE W REFLEX MICROSCOPIC
Bilirubin Urine: NEGATIVE
Glucose, UA: NEGATIVE mg/dL
HGB URINE DIPSTICK: NEGATIVE
KETONES UR: NEGATIVE mg/dL
LEUKOCYTES UA: NEGATIVE
Nitrite: NEGATIVE
PROTEIN: NEGATIVE mg/dL
Specific Gravity, Urine: 1.004 — ABNORMAL LOW (ref 1.005–1.030)
pH: 6 (ref 5.0–8.0)

## 2017-09-04 LAB — CBC
HEMATOCRIT: 39.7 % (ref 35.0–47.0)
Hemoglobin: 13.4 g/dL (ref 12.0–16.0)
MCH: 27.7 pg (ref 26.0–34.0)
MCHC: 33.7 g/dL (ref 32.0–36.0)
MCV: 82.3 fL (ref 80.0–100.0)
PLATELETS: 251 10*3/uL (ref 150–440)
RBC: 4.82 MIL/uL (ref 3.80–5.20)
RDW: 14.9 % — AB (ref 11.5–14.5)
WBC: 10.8 10*3/uL (ref 3.6–11.0)

## 2017-09-04 LAB — GLUCOSE, CAPILLARY
GLUCOSE-CAPILLARY: 145 mg/dL — AB (ref 65–99)
Glucose-Capillary: 116 mg/dL — ABNORMAL HIGH (ref 65–99)
Glucose-Capillary: 181 mg/dL — ABNORMAL HIGH (ref 65–99)
Glucose-Capillary: 167 mg/dL — ABNORMAL HIGH (ref 65–99)

## 2017-09-04 MED ORDER — VANCOMYCIN HCL 10 G IV SOLR
1250.0000 mg | INTRAVENOUS | Status: DC
  Administered 2017-09-05 – 2017-09-07 (×4): 1250 mg via INTRAVENOUS
  Filled 2017-09-04 (×4): qty 1250

## 2017-09-04 MED ORDER — VANCOMYCIN HCL IN DEXTROSE 1-5 GM/200ML-% IV SOLN
1000.0000 mg | Freq: Once | INTRAVENOUS | Status: AC
  Administered 2017-09-04: 1000 mg via INTRAVENOUS
  Filled 2017-09-04: qty 200

## 2017-09-04 NOTE — Consult Note (Signed)
Pharmacy Antibiotic Note  Alyssa Perkins is a 66 y.o. female admitted on 09/02/2017 with Wound infection.  Pharmacy has been consulted for Vancomycin  dosing. Patient also receiving Unasyn.   Plan: Will give vancomycin 1g IV x 1 dose and then start Vancomycin 1250 IV every 18 hours with 8 hour stack dosing.  Goal trough 15-20 mcg/mL. Calculated trough at Css is 18. Trough level prior to 4th dose.   Height: 5\' 3"  (160 cm) Weight: 214 lb (97.1 kg) IBW/kg (Calculated) : 52.4  Temp (24hrs), Avg:98.3 F (36.8 C), Min:97.7 F (36.5 C), Max:99 F (37.2 C)  Recent Labs  Lab 09/02/17 1250 09/04/17 0338  WBC 17.5* 10.8  CREATININE 0.94 1.03*    Estimated Creatinine Clearance: 59.6 mL/min (A) (by C-G formula based on SCr of 1.03 mg/dL (H)).    No Active Allergies  Antimicrobials this admission: 6/18 Unasyn  >>  6/20 vancomycin >>  Dose adjustments this admission:   Microbiology results: 6/18 BCx: NG x 2 days  6/18 Wound Cx: abundant staph aureus    Thank you for allowing pharmacy to be a part of this patient's care.  Pernell Dupre, PharmD, BCPS Clinical Pharmacist 09/04/2017 2:52 PM

## 2017-09-04 NOTE — Progress Notes (Signed)
[facility] [place] NAME: Alyssa Perkins    MR#:  [id number]  DATE OF BIRTH:  07/15/1950  SUBJECTIVE:  CHIEF COMPLAINT:   Chief Complaint  Patient presents with  . Toe Injury   -Itching and hives with Percocet yesterday.  Tolerating it well today.  - -No improvement noted in the toe infection.  We will add vancomycin. -MRI today  REVIEW OF SYSTEMS:  Review of Systems  Constitutional: Negative for chills, fever and malaise/fatigue.  HENT: Negative for hearing loss.   Eyes: Negative for blurred vision and double vision.  Respiratory: Negative for cough, shortness of breath and wheezing.   Cardiovascular: Negative for chest pain, palpitations and leg swelling.  Gastrointestinal: Negative for abdominal pain, constipation, diarrhea, nausea and vomiting.  Genitourinary: Negative for dysuria.  Musculoskeletal: Positive for joint pain and myalgias. Negative for falls.  Neurological: Negative for dizziness, focal weakness, seizures, weakness and headaches.  Psychiatric/Behavioral: Negative for depression.    DRUG ALLERGIES:  No Active Allergies  VITALS:  Blood pressure 114/61, pulse 91, temperature 98.3 F (36.8 C), temperature source Oral, resp. rate 18, height 5\' 3"  (1.6 m), weight 97.1 kg (214 lb), SpO2 93 %.  PHYSICAL EXAMINATION:  Physical Exam   GENERAL:  67 y.o.-year-old patient lying in the bed with no acute distress.  EYES: Pupils equal, round, reactive to light and accommodation. No scleral icterus. Extraocular muscles intact.  HEENT: Head atraumatic, normocephalic. Oropharynx and nasopharynx clear.  NECK:  Supple, no jugular venous distention. No thyroid enlargement, no tenderness.  LUNGS: Normal breath sounds bilaterally, no wheezing, rales,rhonchi or crepitation. No use of accessory muscles of respiration.  CARDIOVASCULAR: S1, S2 normal. No murmurs, rubs, or gallops.  ABDOMEN: Soft, nontender, nondistended. Bowel  sounds present. No organomegaly or mass.  EXTREMITIES: right foot with improved erythema and swelling, debrided superficial skin on second toe with purulent discharge. No pedal edema, cyanosis, or clubbing.  NEUROLOGIC: Cranial nerves II through XII are intact. Muscle strength 5/5 in all extremities. Sensation intact. Gait not checked.  PSYCHIATRIC: The patient is alert and oriented x 3.  SKIN: No obvious rash, lesion, or ulcer.    LABORATORY PANEL:   CBC Recent Labs  Lab 09/04/17 0338  WBC 10.8  HGB 13.4  HCT 39.7  PLT 251   ------------------------------------------------------------------------------------------------------------------  Chemistries  Recent Labs  Lab 09/02/17 1250 09/04/17 0338  NA 138 140  K 4.2 3.7  CL 103 103  CO2 25 28  GLUCOSE 248* 99  BUN 11 16  CREATININE 0.94 1.03*  CALCIUM 8.9 8.3*  AST 24  --   ALT 16  --   ALKPHOS 126  --   BILITOT 1.0  --    ------------------------------------------------------------------------------------------------------------------  Cardiac Enzymes No results for input(s): TROPONINI in the last 168 hours. ------------------------------------------------------------------------------------------------------------------  RADIOLOGY:  Dg Foot Complete Right  Result Date: 09/02/2017 CLINICAL DATA:  Swollen right second toe with no known injury. There is a bruised appearance and there was some oozing. History of diabetes. EXAM: RIGHT FOOT COMPLETE - 3+ VIEW COMPARISON:  Right foot series dated July 01, 2010 FINDINGS: There is diffuse soft tissue swelling of the second toe. There is mild cutaneous irregularity of the lateral aspect of the tip of the toe. The joint spaces are well maintained. There are no bony changes to suggest osteomyelitis. There is mild soft tissue swelling of the forefoot. IMPRESSION: Soft tissue swelling diffusely and superficial disruption of the tip of the second toe  suggesting an open wound which  may be the site of drainage. Findings likely reflect cellulitis. No radiographic evidence of osteomyelitis. Electronically Signed   By: David  Martinique M.D.   On: 09/02/2017 13:43    EKG:   Orders placed or performed during the hospital encounter of 10/01/15  . ED EKG within 10 minutes  . ED EKG within 10 minutes  . EKG    ASSESSMENT AND PLAN:   67 year old female with poorly controlled diabetes mellitus, arthritis, IBS, sleep apnea, GERD and chronic back pain presents to hospital secondary to worsening right foot swelling and right toe gangrene.  1.  Right foot cellulitis with right second toe gangrene/infection -Appreciate podiatry and vascular consults. -Patient has good pedal pulses, so likely might not need an angiogram. -on IV Unasyn.  No improvement is noted today, ordered an MRI by podiatry. Add vancomycin -Follow-up on cultures.  Continue dressing changes for now  2.  Uncontrolled diabetes mellitus-A1c of 10 -on Lantus and also on sliding scale insulin -Sugars are better controlled today  3.  GERD-on Protonix  4.  Hypertension-on quinapril  5.  DVT prophylaxis-Lovenox  Had a fall this morning, slipped from the side of her bed.  No neurological changes.  Continue to monitor.  Fall precautions   All the records are reviewed and case discussed with Care Management/Social Workerr. Management plans discussed with the patient, family and they are in agreement.  CODE STATUS: Full Code  TOTAL TIME TAKING CARE OF THIS PATIENT: 37 minutes.   POSSIBLE D/C IN 2-3 DAYS, DEPENDING ON CLINICAL CONDITION.   Lether Tesch M.D on 09/04/2017 at 12:13 PM  Between 7am to 6pm - Pager - [phone]  After 6pm go to [email or web address] - password [email or web address] [facility]  Office  [phone]  CC: Primary care physician; [name], DO

## 2017-09-04 NOTE — Progress Notes (Signed)
Subjective/Chief Complaint: [name] cannot pain in her right foot.  Had some hives and itching overnight.   Objective: Vital signs in last 24 hours: Temp:  [97.7 F (36.5 C)-99 F (37.2 C)] 99 F (37.2 C) (06/19 2338) Pulse Rate:  [83-92] 83 (06/19 2338) Resp:  [18] 18 (06/19 2338) BP: (100-109)/(55-60) 100/55 (06/19 2338) SpO2:  [96 %-98 %] 98 % (06/19 2338) Last BM Date: 09/02/17  Intake/Output from previous day: 06/19 0701 - 06/20 0700 In: 100 [IV Piggyback:100] Out: -  Intake/Output this shift: No intake/output data recorded.  Some drainage is noted on the bandaging.  Upon removal still some localized erythema at the base of the right second toe and forefoot.  The dorsal portion of the toe still appears fibrotic and starting to appear more nonviable.  Lab Results:  Recent Labs    09/02/17 1250 09/04/17 0338  WBC 17.5* 10.8  HGB 14.8 13.4  HCT 44.5 39.7  PLT 280 251   BMET Recent Labs    09/02/17 1250 09/04/17 0338  NA 138 140  K 4.2 3.7  CL 103 103  CO2 25 28  GLUCOSE 248* 99  BUN 11 16  CREATININE 0.94 1.03*  CALCIUM 8.9 8.3*   PT/INR No results for input(s): LABPROT, INR in the last 72 hours. ABG No results for input(s): PHART, HCO3 in the last 72 hours.  Invalid input(s): PCO2, PO2  Studies/Results: Dg Foot Complete Right  Result Date: 09/02/2017 CLINICAL DATA:  Swollen right second toe with no known injury. There is a bruised appearance and there was some oozing. History of diabetes. EXAM: RIGHT FOOT COMPLETE - 3+ VIEW COMPARISON:  Right foot series dated July 01, 2010 FINDINGS: There is diffuse soft tissue swelling of the second toe. There is mild cutaneous irregularity of the lateral aspect of the tip of the toe. The joint spaces are well maintained. There are no bony changes to suggest osteomyelitis. There is mild soft tissue swelling of the forefoot. IMPRESSION: Soft tissue swelling diffusely and superficial disruption of the tip of the  second toe suggesting an open wound which may be the site of drainage. Findings likely reflect cellulitis. No radiographic evidence of osteomyelitis. Electronically Signed   By: David  Martinique M.D.   On: 09/02/2017 13:43    Anti-infectives: Anti-infectives (From admission, onward)   Start     Dose/Rate Route Frequency Ordered Stop   09/02/17 1800  piperacillin-tazobactam (ZOSYN) IVPB 3.375 g  Status:  Discontinued     3.375 g 12.5 mL/hr over 240 Minutes Intravenous Every 8 hours 09/02/17 1447 09/02/17 1451   09/02/17 1500  Ampicillin-Sulbactam (UNASYN) 3 g in sodium chloride 0.9 % 100 mL IVPB     3 g 200 mL/hr over 30 Minutes Intravenous Every 6 hours 09/02/17 1451     09/02/17 1415  piperacillin-tazobactam (ZOSYN) IVPB 3.375 g  Status:  Discontinued     3.375 g 100 mL/hr over 30 Minutes Intravenous  Once 09/02/17 1405 09/02/17 1513   09/02/17 1415  vancomycin (VANCOCIN) IVPB 1000 mg/200 mL premix  Status:  Discontinued     1,000 mg 200 mL/hr over 60 Minutes Intravenous  Once 09/02/17 1405 09/02/17 1451      Assessment/Plan: s/p * No surgery found * Assessment: Cellulitis with abscess right second toe, not much improvement.   Plan: Bactroban and sterile bandage reapplied.  At this point we will go ahead and obtain an MRI to evaluate for any deeper infection.  Reevaluate after her MRI.  LOS:  2 days    Alyssa Perkins 09/04/2017

## 2017-09-05 ENCOUNTER — Inpatient Hospital Stay: Payer: Medicare Other | Admitting: Registered Nurse

## 2017-09-05 ENCOUNTER — Encounter
Admission: EM | Disposition: A | Discharge status: Home / Self Care | Payer: Self-pay | Source: Home / Self Care | Attending: Internal Medicine

## 2017-09-05 ENCOUNTER — Encounter: Payer: Self-pay | Admitting: Anesthesiology

## 2017-09-05 HISTORY — PX: IRRIGATION AND DEBRIDEMENT FOOT: SHX6602

## 2017-09-05 HISTORY — PX: AMPUTATION TOE: SHX6595

## 2017-09-05 LAB — BASIC METABOLIC PANEL
Anion gap: 9 (ref 5–15)
BUN: 14 mg/dL (ref 6–20)
CO2: 26 mmol/L (ref 22–32)
Calcium: 8 mg/dL — ABNORMAL LOW (ref 8.9–10.3)
Chloride: 103 mmol/L (ref 101–111)
Creatinine, Ser: 0.98 mg/dL (ref 0.44–1.00)
GFR calc Af Amer: >60 mL/min (ref >60)
GFR, EST NON AFRICAN AMERICAN: 59 mL/min — AB (ref >60)
GLUCOSE: 313 mg/dL — AB (ref 65–99)
POTASSIUM: 3.6 mmol/L (ref 3.5–5.1)
Sodium: 138 mmol/L (ref 135–145)

## 2017-09-05 LAB — GLUCOSE, CAPILLARY
GLUCOSE-CAPILLARY: 120 mg/dL — AB (ref 65–99)
Glucose-Capillary: 225 mg/dL — ABNORMAL HIGH (ref 65–99)
Glucose-Capillary: 240 mg/dL — ABNORMAL HIGH (ref 65–99)
Glucose-Capillary: 104 mg/dL — ABNORMAL HIGH (ref 65–99)
Glucose-Capillary: 154 mg/dL — ABNORMAL HIGH (ref 65–99)

## 2017-09-05 SURGERY — AMPUTATION, TOE
Anesthesia: General | Site: Foot | Laterality: Right | Wound class: "Dirty or Infected "

## 2017-09-05 MED ORDER — BUPIVACAINE HCL (PF) 0.5 % IJ SOLN
INTRAMUSCULAR | Status: AC
  Filled 2017-09-05: qty 30

## 2017-09-05 MED ORDER — MIDAZOLAM HCL 2 MG/2ML IJ SOLN
INTRAMUSCULAR | Status: AC
  Filled 2017-09-05: qty 2

## 2017-09-05 MED ORDER — MORPHINE SULFATE (PF) 2 MG/ML IV SOLN
2.0000 mg | INTRAVENOUS | Status: DC | PRN
  Administered 2017-09-06: 2 mg via INTRAVENOUS
  Filled 2017-09-05: qty 1

## 2017-09-05 MED ORDER — LIDOCAINE HCL (PF) 2 % IJ SOLN
INTRAMUSCULAR | Status: AC
  Filled 2017-09-05: qty 10

## 2017-09-05 MED ORDER — NEOMYCIN-POLYMYXIN B GU 40-200000 IR SOLN
Status: AC
  Filled 2017-09-05: qty 2

## 2017-09-05 MED ORDER — FENTANYL CITRATE (PF) 100 MCG/2ML IJ SOLN: 25.0000 ug | INTRAMUSCULAR | Status: DC | PRN

## 2017-09-05 MED ORDER — PROPOFOL 10 MG/ML IV BOLUS
INTRAVENOUS | Status: AC
  Filled 2017-09-05: qty 20

## 2017-09-05 MED ORDER — FENTANYL CITRATE (PF) 100 MCG/2ML IJ SOLN
INTRAMUSCULAR | Status: DC | PRN
  Administered 2017-09-05 (×2): 25 ug via INTRAVENOUS
  Administered 2017-09-05: 50 ug via INTRAVENOUS

## 2017-09-05 MED ORDER — VANCOMYCIN HCL 1000 MG IV SOLR
INTRAVENOUS | Status: AC
  Filled 2017-09-05: qty 1000

## 2017-09-05 MED ORDER — FENTANYL CITRATE (PF) 100 MCG/2ML IJ SOLN
INTRAMUSCULAR | Status: AC
  Filled 2017-09-05: qty 2

## 2017-09-05 MED ORDER — MIDAZOLAM HCL 2 MG/2ML IJ SOLN
INTRAMUSCULAR | Status: DC | PRN
  Administered 2017-09-05: 2 mg via INTRAVENOUS

## 2017-09-05 MED ORDER — DEXAMETHASONE SODIUM PHOSPHATE 10 MG/ML IJ SOLN
INTRAMUSCULAR | Status: DC | PRN
  Administered 2017-09-05: 10 mg via INTRAVENOUS

## 2017-09-05 MED ORDER — LIDOCAINE HCL (CARDIAC) PF 100 MG/5ML IV SOSY
PREFILLED_SYRINGE | INTRAVENOUS | Status: DC | PRN
  Administered 2017-09-05: 100 mg via INTRAVENOUS

## 2017-09-05 MED ORDER — ONDANSETRON HCL 4 MG/2ML IJ SOLN
INTRAMUSCULAR | Status: DC | PRN
  Administered 2017-09-05: 4 mg via INTRAVENOUS

## 2017-09-05 MED ORDER — OXYCODONE-ACETAMINOPHEN 5-325 MG PO TABS
1.0000 | ORAL_TABLET | Freq: Four times a day (QID) | ORAL | Status: DC | PRN
  Administered 2017-09-05 – 2017-09-06 (×3): 2 via ORAL
  Administered 2017-09-06: 1 via ORAL
  Administered 2017-09-07: 2 via ORAL
  Filled 2017-09-05 (×3): qty 2
  Filled 2017-09-05: qty 1
  Filled 2017-09-05: qty 2

## 2017-09-05 MED ORDER — BUPIVACAINE HCL 0.5 % IJ SOLN
INTRAMUSCULAR | Status: DC | PRN
  Administered 2017-09-05: 10 mL

## 2017-09-05 MED ORDER — LACTATED RINGERS IV SOLN
INTRAVENOUS | Status: DC | PRN
  Administered 2017-09-05: 20:00:00 via INTRAVENOUS

## 2017-09-05 MED ORDER — NEOMYCIN-POLYMYXIN B GU 40-200000 IR SOLN
Status: DC | PRN
  Administered 2017-09-05: 2 mL

## 2017-09-05 MED ORDER — ONDANSETRON HCL 4 MG/2ML IJ SOLN: 4.0000 mg | Freq: Once | INTRAMUSCULAR | Status: DC | PRN

## 2017-09-05 MED ORDER — PROPOFOL 10 MG/ML IV BOLUS
INTRAVENOUS | Status: DC | PRN
  Administered 2017-09-05: 120 mg via INTRAVENOUS

## 2017-09-05 MED ORDER — DEXAMETHASONE SODIUM PHOSPHATE 10 MG/ML IJ SOLN
INTRAMUSCULAR | Status: AC
  Filled 2017-09-05: qty 1

## 2017-09-05 MED ORDER — ONDANSETRON HCL 4 MG/2ML IJ SOLN
INTRAMUSCULAR | Status: AC
  Filled 2017-09-05: qty 2

## 2017-09-05 SURGICAL SUPPLY — 45 items
BANDAGE ACE 4X5 VEL STRL LF (GAUZE/BANDAGES/DRESSINGS) ×3 IMPLANT
BLADE MED AGGRESSIVE (BLADE) ×2 IMPLANT
BLADE OSC/SAGITTAL MD 5.5X18 (BLADE) ×2 IMPLANT
BLADE SURG 15 STRL LF DISP TIS (BLADE) ×4 IMPLANT
BLADE SURG 15 STRL SS (BLADE) ×2
BLADE SURG MINI STRL (BLADE) ×3 IMPLANT
BNDG ESMARK 4X12 TAN STRL LF (GAUZE/BANDAGES/DRESSINGS) ×3 IMPLANT
BNDG GAUZE 4.5X4.1 6PLY STRL (MISCELLANEOUS) ×3 IMPLANT
CANISTER SUCT 1200ML W/VALVE (MISCELLANEOUS) ×3 IMPLANT
CUFF TOURN 18 STER (MISCELLANEOUS) ×3 IMPLANT
CUFF TOURN DUAL PL 12 NO SLV (MISCELLANEOUS) ×2 IMPLANT
DRAPE FLUOR MINI C-ARM 54X84 (DRAPES) ×2 IMPLANT
DURAPREP 26ML APPLICATOR (WOUND CARE) ×3 IMPLANT
ELECT REM PT RETURN 9FT ADLT (ELECTROSURGICAL) ×3
ELECTRODE REM PT RTRN 9FT ADLT (ELECTROSURGICAL) ×2 IMPLANT
GAUZE PETRO XEROFOAM 1X8 (MISCELLANEOUS) ×3 IMPLANT
GAUZE SPONGE 4X4 12PLY STRL (GAUZE/BANDAGES/DRESSINGS) ×3 IMPLANT
GAUZE STRETCH 2X75IN STRL (MISCELLANEOUS) ×3 IMPLANT
GLOVE BIO SURGEON STRL SZ7.5 (GLOVE) ×4 IMPLANT
GLOVE INDICATOR 8.0 STRL GRN (GLOVE) ×4 IMPLANT
GOWN STRL REUS W/ TWL LRG LVL3 (GOWN DISPOSABLE) ×4 IMPLANT
GOWN STRL REUS W/TWL LRG LVL3 (GOWN DISPOSABLE) ×2
HANDPIECE VERSAJET DEBRIDEMENT (MISCELLANEOUS) ×3 IMPLANT
KIT TURNOVER KIT A (KITS) ×3 IMPLANT
LABEL OR SOLS (LABEL) ×3 IMPLANT
NDL FILTER BLUNT 18X1 1/2 (NEEDLE) ×2 IMPLANT
NDL HYPO 25X1 1.5 SAFETY (NEEDLE) ×4 IMPLANT
NEEDLE FILTER BLUNT 18X 1/2SAF (NEEDLE) ×1
NEEDLE FILTER BLUNT 18X1 1/2 (NEEDLE) ×2 IMPLANT
NEEDLE HYPO 25X1 1.5 SAFETY (NEEDLE) ×3 IMPLANT
NS IRRIG 500ML POUR BTL (IV SOLUTION) ×3 IMPLANT
PACK EXTREMITY ARMC (MISCELLANEOUS) ×3 IMPLANT
SOL .9 NS 3000ML IRR  AL (IV SOLUTION) ×1
SOL .9 NS 3000ML IRR UROMATIC (IV SOLUTION) ×2 IMPLANT
SOL PREP PVP 2OZ (MISCELLANEOUS) ×3
SOLUTION PREP PVP 2OZ (MISCELLANEOUS) ×2 IMPLANT
STOCKINETTE STRL 6IN 960660 (GAUZE/BANDAGES/DRESSINGS) ×3 IMPLANT
STRIP CLOSURE SKIN 1/4X4 (GAUZE/BANDAGES/DRESSINGS) ×2 IMPLANT
SUT ETHILON 3-0 FS-10 30 BLK (SUTURE) ×3
SUT ETHILON 4-0 (SUTURE)
SUT ETHILON 4-0 FS2 18XMFL BLK (SUTURE)
SUTURE EHLN 3-0 FS-10 30 BLK (SUTURE) ×2 IMPLANT
SUTURE ETHLN 4-0 FS2 18XMF BLK (SUTURE) IMPLANT
SWAB DUAL CULTURE TRANS RED ST (MISCELLANEOUS) ×2 IMPLANT
SYR 10ML LL (SYRINGE) ×3 IMPLANT

## 2017-09-05 NOTE — Progress Notes (Signed)
Subjective/Chief Complaint: Patient seen.  Still significant pain in the right foot.  At a level of 8 this morning.   Objective: Vital signs in last 24 hours: Temp:  [97.5 F (36.4 C)-99.2 F (37.3 C)] 97.5 F (36.4 C) (06/21 0803) Pulse Rate:  [74-81] 74 (06/21 0803) Resp:  [18] 18 (06/20 2359) BP: (119-128)/(44-62) 123/62 (06/21 0803) SpO2:  [96 %-99 %] 96 % (06/21 0803) Last BM Date: 09/02/17  Intake/Output from previous day: 06/20 0701 - 06/21 0700 In: 1010 [P.O.:360; IV Piggyback:650] Out: -  Intake/Output this shift: No intake/output data recorded.  Some increased purulence is noted on the proximal bandaging with continued erythema in the forefoot at the base of the right second toe.  Some necrosis of the tissue in the dorsal interspaces noted.  MRI was negative for any bone or joint or significant deep abscess.  Lab Results:  Recent Labs    09/02/17 1250 09/04/17 0338  WBC 17.5* 10.8  HGB 14.8 13.4  HCT 44.5 39.7  PLT 280 251   BMET Recent Labs    09/04/17 0338 09/05/17 0253  NA 140 138  K 3.7 3.6  CL 103 103  CO2 28 26  GLUCOSE 99 313*  BUN 16 14  CREATININE 1.03* 0.98  CALCIUM 8.3* 8.0*   PT/INR No results for input(s): LABPROT, INR in the last 72 hours. ABG No results for input(s): PHART, HCO3 in the last 72 hours.  Invalid input(s): PCO2, PO2  Studies/Results: Mr Foot Right Wo Contrast  Result Date: 09/04/2017 CLINICAL DATA:  Foot pain and swelling. EXAM: MRI OF THE RIGHT FOREFOOT WITHOUT CONTRAST TECHNIQUE: Multiplanar, multisequence MR imaging of the right forefoot was performed. No intravenous contrast was administered. COMPARISON:  None. FINDINGS: Bones/Joint/Cartilage Soft tissue wound along the plantar aspect of the second toe. No cortical destruction or marrow edema. No fracture or dislocation. Normal alignment. No joint effusion. Mild osteoarthritis of the first MTP joint. Ligaments Collateral ligaments are intact.  Lisfranc ligament  is intact. Muscles and Tendons Flexor, peroneal and extensor compartment tendons are intact. Muscles are normal. Soft tissue No fluid collection or hematoma.  No soft tissue mass. IMPRESSION: 1. No osteomyelitis of the right forefoot. Electronically Signed   By: Kathreen Devoid   On: 09/04/2017 12:57    Anti-infectives: Anti-infectives (From admission, onward)   Start     Dose/Rate Route Frequency Ordered Stop   09/05/17 0000  vancomycin (VANCOCIN) 1,250 mg in sodium chloride 0.9 % 250 mL IVPB     1,250 mg 166.7 mL/hr over 90 Minutes Intravenous Every 18 hours 09/04/17 1455     09/04/17 1245  vancomycin (VANCOCIN) IVPB 1000 mg/200 mL premix     1,000 mg 200 mL/hr over 60 Minutes Intravenous  Once 09/04/17 1238 09/04/17 1545   09/02/17 1800  piperacillin-tazobactam (ZOSYN) IVPB 3.375 g  Status:  Discontinued     3.375 g 12.5 mL/hr over 240 Minutes Intravenous Every 8 hours 09/02/17 1447 09/02/17 1451   09/02/17 1500  Ampicillin-Sulbactam (UNASYN) 3 g in sodium chloride 0.9 % 100 mL IVPB     3 g 200 mL/hr over 30 Minutes Intravenous Every 6 hours 09/02/17 1451     09/02/17 1415  piperacillin-tazobactam (ZOSYN) IVPB 3.375 g  Status:  Discontinued     3.375 g 100 mL/hr over 30 Minutes Intravenous  Once 09/02/17 1405 09/02/17 1513   09/02/17 1415  vancomycin (VANCOCIN) IVPB 1000 mg/200 mL premix  Status:  Discontinued     1,000 mg  200 mL/hr over 60 Minutes Intravenous  Once 09/02/17 1405 09/02/17 1451      Assessment/Plan: s/p * No surgery found * Assessment: Cellulitis with abscess right foot.   Plan: Dressing reapplied to the right foot.  Discussed with the patient that at this point I think we need to have a formal debridement in the operating room to debride any devitalized tissue.  Discussed that this may also require amputation of the second toe due to the fibrotic nonviable tissue along the dorsum of the second toe.  Discussed possible risks and complications of nonhealing due to her  diabetes or infection.  May require additional debridements depending on her response.  No guarantees could be given as to the outcome.  Patient will be n.p.o. now and we will plan for surgery later this evening pending OR availability or potentially tomorrow.  Consent form to be signed in the chart.  LOS: 3 days    Durward Fortes 09/05/2017

## 2017-09-05 NOTE — Anesthesia Postprocedure Evaluation (Signed)
Anesthesia Post Note  Patient: Alyssa Perkins  Procedure(s) Performed: AMPUTATION TOE-RIGHT 2ND TOE (Right Foot) IRRIGATION AND DEBRIDEMENT FOOT (Foot)  Patient location during evaluation: PACU Anesthesia Type: General Level of consciousness: awake and alert Pain management: pain level controlled Vital Signs Assessment: post-procedure vital signs reviewed and stable Respiratory status: spontaneous breathing, nonlabored ventilation, respiratory function stable and patient connected to nasal cannula oxygen Cardiovascular status: blood pressure returned to baseline and stable Postop Assessment: no apparent nausea or vomiting Anesthetic complications: no     Last Vitals:  Vitals:   09/05/17 2127 09/05/17 2137  BP: 132/68 135/72  Pulse: 75 73  Resp: 13 17  Temp:  36.4 C  SpO2: 98% 98%    Last Pain:  Vitals:   09/05/17 2137  TempSrc:   PainSc: 0-No pain                 Martha Clan

## 2017-09-05 NOTE — Anesthesia Procedure Notes (Signed)
Procedure Name: LMA Insertion Date/Time: 09/05/2017 8:13 PM Performed by: Aline Brochure, CRNA Pre-anesthesia Checklist: Patient identified, Emergency Drugs available, Suction available and Patient being monitored Patient Re-evaluated:Patient Re-evaluated prior to induction Oxygen Delivery Method: Circle system utilized Preoxygenation: Pre-oxygenation with 100% oxygen Induction Type: IV induction Ventilation: Mask ventilation without difficulty LMA: LMA inserted LMA Size: 3.5 Number of attempts: 1 Placement Confirmation: positive ETCO2 and breath sounds checked- equal and bilateral Tube secured with: Tape Dental Injury: Teeth and Oropharynx as per pre-operative assessment

## 2017-09-05 NOTE — Consult Note (Signed)
Pharmacy Antibiotic Note  Alyssa Perkins is a 67 y.o. female admitted on 09/02/2017 with Wound infection.  Pharmacy has been consulted for Vancomycin  dosing. Patient also receiving Unasyn.   Plan: Will give vancomycin 1g IV x 1 dose and then start Vancomycin 1250 IV every 18 hours with 8 hour stack dosing.  Goal trough 15-20 mcg/mL. Calculated trough at Css is 18. Trough level prior to 4th dose.   6/21: Renal function stable. Will need to watch renal function as pt at risk for accumulation. Will order SCr for AM. Consider need to adjust dose if SCr bumps.   Height: 5\' 3"  (160 cm) Weight: 214 lb (97.1 kg) IBW/kg (Calculated) : 52.4  Temp (24hrs), Avg:98.4 F (36.9 C), Min:97.5 F (36.4 C), Max:99.2 F (37.3 C)  Recent Labs  Lab 09/02/17 1250 09/04/17 0338 09/05/17 0253  WBC 17.5* 10.8  --   CREATININE 0.94 1.03* 0.98    Estimated Creatinine Clearance: 62.7 mL/min (by C-G formula based on SCr of 0.98 mg/dL).    No Active Allergies  Antimicrobials this admission: 6/18 Unasyn  >>  6/20 vancomycin >>  Dose adjustments this admission:   Microbiology results: 6/18 BCx: NG x 3 days  6/18 Wound Cx: abundant staph aureus - sensitivities pending   Thank you for allowing pharmacy to be a part of this patient's care.  Rocky Morel, PharmD, BCPS Clinical Pharmacist 09/05/2017 10:45 AM

## 2017-09-05 NOTE — Op Note (Signed)
Date of operation: 09/05/2017.  Surgeon: Durward Fortes D.P.M.  Preoperative diagnosis: Cellulitis with abscess and gangrenous changes right second toe.  Postoperative diagnosis: Same.  Procedure: Amputation right second toe.  Anesthesia: LMA with local.  Hemostasis: Pneumatic tourniquet right ankle 250 mmHg.  Estimated blood loss: Less than 5 cc.  Pathology: Right second toe.  Injectables: 10 cc 0.5% bupivacaine plain.  Complications: None apparent.  Operative indications: This is a 67 year old female with recent development of abscess and early gangrenous changes to her right second toe.  Bedside debridement did not seem to clear this up and continued to have increasing necrotic tissue and decision was made for amputation of the second toe.  Operative procedure: Patient was taken to the operating room and placed on the table in the supine position.  Following satisfactory LMA anesthesia a pneumatic tourniquet was applied at the level of the right ankle and foot was prepped and draped in the usual sterile fashion.  The foot was exsanguinated and the tourniquet inflated to 250 mmHg.     Attention was then directed to the dorsal aspect of the right foot where an elliptical incision was made around the base of the second toe from dorsal to plantar with care taken to cut out all necrotic tissue around the base of the second toe.  The incision was carried sharply down to the level of bone and dissection carried back to the joint where the toe was disarticulated and removed in toto.  The soft tissues around the wound were then debrided with a versa jet debrider on a setting of 3 and then irrigated with the versa jet on a setting of 2.  The wound was then flushed with copious amounts of sterile saline and closed using 3-0 nylon simple interrupted sutures.  10 cc of 0.5% bupivacaine plain were then injected postoperatively for analgesia.  Xeroform 4 x 4's and con form were applied and the tourniquet  was released.  ABD and Curlex applied followed by an Ace wrap.  Patient tolerated the procedure and anesthesia well and was transported to the PACU with vital signs stable in good condition.

## 2017-09-05 NOTE — Anesthesia Post-op Follow-up Note (Signed)
Anesthesia QCDR form completed.        

## 2017-09-05 NOTE — Progress Notes (Signed)
[place] [facility] [place] NAME: Alyssa Perkins    MR#:  [id number]  DATE OF BIRTH:  [birth year]  SUBJECTIVE:  CHIEF COMPLAINT:   Chief Complaint  Patient presents with  . Toe Injury   - MRI with no osteomyelitis, but to OR today for debridement - sugars are elevated  REVIEW OF SYSTEMS:  Review of Systems  Constitutional: Negative for chills, fever and malaise/fatigue.  HENT: Negative for hearing loss.   Eyes: Negative for blurred vision and double vision.  Respiratory: Negative for cough, shortness of breath and wheezing.   Cardiovascular: Negative for chest pain, palpitations and leg swelling.  Gastrointestinal: Negative for abdominal pain, constipation, diarrhea, nausea and vomiting.  Genitourinary: Negative for dysuria.  Musculoskeletal: Positive for joint pain and myalgias. Negative for falls.  Neurological: Negative for dizziness, focal weakness, seizures, weakness and headaches.  Psychiatric/Behavioral: Negative for depression.    DRUG ALLERGIES:  No Active Allergies  VITALS:  Blood pressure 123/62, pulse 74, temperature (!) 97.5 F (36.4 C), temperature source Oral, resp. rate 18, height 5\' 3"  (1.6 m), weight 97.1 kg (214 lb), SpO2 96 %.  PHYSICAL EXAMINATION:  Physical Exam   GENERAL:  67 y.o.-year-old patient lying in the bed with no acute distress.  EYES: Pupils equal, round, reactive to light and accommodation. No scleral icterus. Extraocular muscles intact.  HEENT: Head atraumatic, normocephalic. Oropharynx and nasopharynx clear.  NECK:  Supple, no jugular venous distention. No thyroid enlargement, no tenderness.  LUNGS: Normal breath sounds bilaterally, no wheezing, rales,rhonchi or crepitation. No use of accessory muscles of respiration.  CARDIOVASCULAR: S1, S2 normal. No murmurs, rubs, or gallops.  ABDOMEN: Soft, nontender, nondistended. Bowel sounds present. No organomegaly or mass.  EXTREMITIES: right foot with  improved erythema and swelling, debrided superficial skin on second toe with purulent discharge. No pedal edema, cyanosis, or clubbing.  NEUROLOGIC: Cranial nerves II through XII are intact. Muscle strength 5/5 in all extremities. Sensation intact. Gait not checked.  PSYCHIATRIC: The patient is alert and oriented x 3.  SKIN: No obvious rash, lesion, or ulcer.    LABORATORY PANEL:   CBC Recent Labs  Lab 09/04/17 0338  WBC 10.8  HGB 13.4  HCT 39.7  PLT 251   ------------------------------------------------------------------------------------------------------------------  Chemistries  Recent Labs  Lab 09/02/17 1250  09/05/17 0253  NA 138   < > 138  K 4.2   < > 3.6  CL 103   < > 103  CO2 25   < > 26  GLUCOSE 248*   < > 313*  BUN 11   < > 14  CREATININE 0.94   < > 0.98  CALCIUM 8.9   < > 8.0*  AST 24  --   --   ALT 16  --   --   ALKPHOS 126  --   --   BILITOT 1.0  --   --    < > = values in this interval not displayed.   ------------------------------------------------------------------------------------------------------------------  Cardiac Enzymes No results for input(s): TROPONINI in the last 168 hours. ------------------------------------------------------------------------------------------------------------------  RADIOLOGY:  Mr Foot Right Wo Contrast  Result Date: 09/04/2017 CLINICAL DATA:  Foot pain and swelling. EXAM: MRI OF THE RIGHT FOREFOOT WITHOUT CONTRAST TECHNIQUE: Multiplanar, multisequence MR imaging of the right forefoot was performed. No intravenous contrast was administered. COMPARISON:  None. FINDINGS: Bones/Joint/Cartilage Soft tissue wound along the plantar aspect of the second toe. No cortical destruction or marrow edema. No fracture or dislocation. Normal  alignment. No joint effusion. Mild osteoarthritis of the first MTP joint. Ligaments Collateral ligaments are intact.  Lisfranc ligament is intact. Muscles and Tendons Flexor, peroneal and extensor  compartment tendons are intact. Muscles are normal. Soft tissue No fluid collection or hematoma.  No soft tissue mass. IMPRESSION: 1. No osteomyelitis of the right forefoot. Electronically Signed   By: Kathreen Devoid   On: 09/04/2017 12:57    EKG:   Orders placed or performed during the hospital encounter of 10/01/15  . ED EKG within 10 minutes  . ED EKG within 10 minutes  . EKG    ASSESSMENT AND PLAN:   67 year old female with poorly controlled diabetes mellitus, arthritis, IBS, sleep apnea, GERD and chronic back pain presents to hospital secondary to worsening right foot swelling and right toe gangrene.  1.  Right foot cellulitis with right second toe gangrene/infection -Appreciate podiatry and vascular consults. -Patient has good pedal pulses, so will not need an angiogram. -on IV Unasyn and vancomycin -MRI of the foot showed no osteomyelitis -wound cultures growing MRSA- discontinue unasyn  2.  Uncontrolled diabetes mellitus-A1c of 10 -on Lantus and also on sliding scale insulin -adjust doses as tolerated  3.  GERD-on Protonix  4.  Hypertension-on quinapril  5.  DVT prophylaxis-Lovenox  To OR today   All the records are reviewed and case discussed with Care Management/Social Workerr. Management plans discussed with the patient, family and they are in agreement.  CODE STATUS: Full Code  TOTAL TIME TAKING CARE OF THIS PATIENT: 35 minutes.   POSSIBLE D/C IN 1-2 DAYS, DEPENDING ON CLINICAL CONDITION.   Gladstone Lighter M.D on 09/05/2017 at 1:36 PM  Between 7am to 6pm - Pager - [phone]  After 6pm go to [email or web address] - password [email or web address] [facility]  Office  [phone]  CC: Primary care physician; [name], DO

## 2017-09-05 NOTE — Anesthesia Preprocedure Evaluation (Addendum)
Anesthesia Evaluation  Patient identified by MRN, date of birth, ID band Patient awake    Reviewed: Allergy & Precautions, H&P , NPO status , Patient's Chart, lab work & pertinent test results, reviewed documented beta blocker date and time   History of Anesthesia Complications (+) PONV and history of anesthetic complications  Airway Mallampati: II  TM Distance: >3 FB Neck ROM: full    Dental  (+) Dental Advidsory Given, Edentulous Upper, Edentulous Lower   Pulmonary neg shortness of breath, asthma , sleep apnea , neg COPD, neg recent URI, former smoker,           Cardiovascular Exercise Tolerance: Good hypertension, (-) angina(-) CAD, (-) Past MI, (-) Cardiac Stents and (-) CABG Normal cardiovascular exam(-) dysrhythmias (-) Valvular Problems/Murmurs     Neuro/Psych neg Seizures PSYCHIATRIC DISORDERS Anxiety Depression  Neuromuscular disease    GI/Hepatic Neg liver ROS, GERD  ,  Endo/Other  diabetes  Renal/GU negative Renal ROS  negative genitourinary   Musculoskeletal   Abdominal   Peds  Hematology negative hematology ROS (+)   Anesthesia Other Findings Past Medical History: No date: Anginal pain ([place]) No date: Anxiety     Comment:  not on any medication at this time No date: Arthritis No date: Asthma     Comment:  "sports asthma or exercised induced"seen at [facility] for primary No date: Chronic back pain No date: Chronic neck pain No date: Complication of anesthesia     Comment:  "difficulty waking up from surgery" No date: Depression     Comment:  has OCD, states counts constantly No date: GERD (gastroesophageal reflux disease) No date: Headache(784.0)     Comment:  hx of migraines No date: Hyperlipidemia No date: IBS (irritable bowel syndrome) No date: PONV (postoperative nausea and vomiting) No date: RLS (restless legs syndrome) No date: Sleep apnea No date: Thyroid  disease   Reproductive/Obstetrics negative OB ROS                            Anesthesia Physical Anesthesia Plan  ASA: III  Anesthesia Plan: General   Post-op Pain Management:    Induction: Intravenous  PONV Risk Score and Plan: 4 or greater and Ondansetron and Dexamethasone  Airway Management Planned: LMA  Additional Equipment:   Intra-op Plan:   Post-operative Plan: Extubation in OR  Informed Consent: I have reviewed the patients History and Physical, chart, labs and discussed the procedure including the risks, benefits and alternatives for the proposed anesthesia with the patient or authorized representative who has indicated his/her understanding and acceptance.   Dental Advisory Given  Plan Discussed with: Anesthesiologist, CRNA and Surgeon  Anesthesia Plan Comments:         Anesthesia Quick Evaluation

## 2017-09-05 NOTE — NC FL2 (Signed)
[place] LEVEL OF CARE SCREENING TOOL     IDENTIFICATION  Patient Name: Alyssa Perkins Birthdate: Jan 16, 1951 Sex: female Admission Date (Current Location): 09/02/2017  [place] and [place] Number:  Engineering geologist and Address:  [facility], [address] [place], [place] [address]      Provider Number: [id number]  Attending Physician Name and Address:  Gladstone Lighter, MD  Relative Name and Phone Number:       Current Level of Care: Hospital Recommended Level of Care: [place] Prior Approval Number:    Date Approved/Denied:   PASRR Number: ([id number])  Discharge Plan: SNF    Current Diagnoses: Patient Active Problem List   Diagnosis Date Noted  . Gangrene associated with diabetes mellitus ([place]) 09/02/2017  . Diabetic retinopathy ([place]) 02/26/2017  . Insomnia 02/21/2017  . Chronic headaches 02/21/2017  . OSA on CPAP 10/21/2016  . Depression 10/15/2016  . Chronic back pain 09/11/2016  . Chronic neck pain 09/11/2016  . RLS (restless legs syndrome) 07/08/2016  . Bilateral carpal tunnel syndrome 03/26/2016  . De Quervain's tenosynovitis, left 11/14/2015  . DM (diabetes mellitus), type 2, uncontrolled w/neurologic complication ([place]) 37/16/9678  . Hyperlipidemia associated with type 2 diabetes mellitus ([place]) 09/16/2014  . Hypertension 09/16/2014  . Acid reflux 09/16/2014  . Cervical radiculopathy 12/30/2011    Orientation RESPIRATION BLADDER Height & Weight     Self, Time, Situation, Place  Normal Continent Weight: 214 lb (97.1 kg) Height:  5\' 3"  (160 cm)  BEHAVIORAL SYMPTOMS/MOOD NEUROLOGICAL BOWEL NUTRITION STATUS      Continent Diet(Diet: Heart Healthy/ Carb Modified. )  AMBULATORY STATUS COMMUNICATION OF NEEDS Skin   Extensive Assist Verbally PU Stage and Appropriate Care(Diabetic foot ulcer right. )                       Personal Care Assistance Level of Assistance  Bathing,  Feeding, Dressing Bathing Assistance: Limited assistance Feeding assistance: Independent Dressing Assistance: Limited assistance     Functional Limitations Info  Sight, Hearing, Speech Sight Info: Adequate Hearing Info: Adequate Speech Info: Adequate    SPECIAL CARE FACTORS FREQUENCY  PT (By licensed PT), OT (By licensed OT)     PT Frequency: (5) OT Frequency: (5)            Contractures      Additional Factors Info  Code Status, Allergies Code Status Info: (Full Code. ) Allergies Info: (No Active Allergies. )           Current Medications (09/05/2017):  This is the current hospital active medication list Current Facility-Administered Medications  Medication Dose Route Frequency Provider Last Rate Last Dose  . acetaminophen (TYLENOL) tablet 650 mg  650 mg Oral Q6H PRN Fritzi Mandes, MD   650 mg at 09/04/17 1302   Or  . acetaminophen (TYLENOL) suppository 650 mg  650 mg Rectal Q6H PRN Fritzi Mandes, MD      . Ampicillin-Sulbactam (UNASYN) 3 g in sodium chloride 0.9 % 100 mL IVPB  3 g Intravenous Q6H Fritzi Mandes, MD 200 mL/hr at 09/05/17 0906 3 g at 09/05/17 0906  . enoxaparin (LOVENOX) injection 40 mg  40 mg Subcutaneous Q24H Fritzi Mandes, MD   40 mg at 09/04/17 2237  . gabapentin (NEURONTIN) capsule 400 mg  400 mg Oral QHS Fritzi Mandes, MD   400 mg at 09/04/17 2236  . hydrOXYzine (ATARAX/VISTARIL) tablet 25 mg  25 mg Oral Daily PRN Fritzi Mandes, MD  25 mg at 09/04/17 1305  . insulin aspart (novoLOG) injection 0-5 Units  0-5 Units Subcutaneous QHS Gladstone Lighter, MD      . insulin aspart (novoLOG) injection 0-9 Units  0-9 Units Subcutaneous TID WC Gladstone Lighter, MD   3 Units at 09/05/17 0904  . insulin glargine (LANTUS) injection 78 Units  78 Units Subcutaneous Daily Fritzi Mandes, MD   78 Units at 09/05/17 0905  . mupirocin cream (BACTROBAN) 2 %   Topical Daily Sharlotte Alamo, DPM      . ondansetron [facility]) tablet 4 mg  4 mg Oral Q6H PRN Fritzi Mandes, MD   4 mg at 09/02/17  1834   Or  . ondansetron (ZOFRAN) injection 4 mg  4 mg Intravenous Q6H PRN Fritzi Mandes, MD   4 mg at 09/03/17 1119  . oxyCODONE-acetaminophen (PERCOCET/ROXICET) 5-325 MG per tablet 1-2 tablet  1-2 tablet Oral Q6H PRN Fritzi Mandes, MD   1 tablet at 09/05/17 0911  . pantoprazole (PROTONIX) EC tablet 40 mg  40 mg Oral Daily Fritzi Mandes, MD   40 mg at 09/05/17 0906  . polyethylene glycol (MIRALAX / GLYCOLAX) packet 17 g  17 g Oral Daily PRN Fritzi Mandes, MD   17 g at 09/05/17 0911  . quinapril (ACCUPRIL) tablet 20 mg  20 mg Oral QHS Fritzi Mandes, MD   20 mg at 09/04/17 2236  . senna (SENOKOT) tablet 8.6 mg  1 tablet Oral BID Fritzi Mandes, MD   8.6 mg at 09/05/17 0906  . sertraline (ZOLOFT) tablet 50 mg  50 mg Oral Daily Fritzi Mandes, MD   50 mg at 09/05/17 0906  . simvastatin (ZOCOR) tablet 40 mg  40 mg Oral QHS Fritzi Mandes, MD   40 mg at 09/04/17 2235  . vancomycin (VANCOCIN) 1,250 mg in sodium chloride 0.9 % 250 mL IVPB  1,250 mg Intravenous Q18H Pernell Dupre, RPH   Stopped at 09/05/17 4235     Discharge Medications: Please see discharge summary for a list of discharge medications.  Relevant Imaging Results:  Relevant Lab Results:   Additional Information (SSN: [id number])  Rece Zechman, Veronia Beets, LCSW

## 2017-09-05 NOTE — Transfer of Care (Signed)
Immediate Anesthesia Transfer of Care Note  Patient: Alyssa Perkins  Procedure(s) Performed: AMPUTATION TOE-RIGHT 2ND TOE (Right Foot) IRRIGATION AND DEBRIDEMENT FOOT (Foot)  Patient Location: PACU  Anesthesia Type:General  Level of Consciousness: sedated  Airway & Oxygen Therapy: Patient connected to face mask oxygen  Post-op Assessment: Post -op Vital signs reviewed and stable  Post vital signs: stable  Last Vitals:  Vitals Value Taken Time  BP 120/63 09/05/2017  8:57 PM  Temp    Pulse 66 09/05/2017  8:57 PM  Resp 16 09/05/2017  8:57 PM  SpO2 99 % 09/05/2017  8:57 PM  Vitals shown include unvalidated device data.  Last Pain:  Vitals:   09/05/17 1658  TempSrc:   PainSc: 6       Patients Stated Pain Goal: 3 (24/26/83 4196)  Complications: No apparent anesthesia complications

## 2017-09-05 NOTE — Care Management Important Message (Signed)
Important Message  Patient Details  Name: Alyssa Perkins MRN: [id number] Date of Birth: 09-07-1950   Medicare Important Message Given:  Yes    [name] A [name] 09/05/2017, 10:10 AM

## 2017-09-05 NOTE — H&P (View-Only) (Signed)
Subjective/Chief Complaint: Patient seen.  Still significant pain in the right foot.  At a level of 8 this morning.   Objective: Vital signs in last 24 hours: Temp:  [97.5 F (36.4 C)-99.2 F (37.3 C)] 97.5 F (36.4 C) (06/21 0803) Pulse Rate:  [74-81] 74 (06/21 0803) Resp:  [18] 18 (06/20 2359) BP: (119-128)/(44-62) 123/62 (06/21 0803) SpO2:  [96 %-99 %] 96 % (06/21 0803) Last BM Date: 09/02/17  Intake/Output from previous day: 06/20 0701 - 06/21 0700 In: 1010 [P.O.:360; IV Piggyback:650] Out: -  Intake/Output this shift: No intake/output data recorded.  Some increased purulence is noted on the proximal bandaging with continued erythema in the forefoot at the base of the right second toe.  Some necrosis of the tissue in the dorsal interspaces noted.  MRI was negative for any bone or joint or significant deep abscess.  Lab Results:  Recent Labs    09/02/17 1250 09/04/17 0338  WBC 17.5* 10.8  HGB 14.8 13.4  HCT 44.5 39.7  PLT 280 251   BMET Recent Labs    09/04/17 0338 09/05/17 0253  NA 140 138  K 3.7 3.6  CL 103 103  CO2 28 26  GLUCOSE 99 313*  BUN 16 14  CREATININE 1.03* 0.98  CALCIUM 8.3* 8.0*   PT/INR No results for input(s): LABPROT, INR in the last 72 hours. ABG No results for input(s): PHART, HCO3 in the last 72 hours.  Invalid input(s): PCO2, PO2  Studies/Results: Mr Foot Right Wo Contrast  Result Date: 09/04/2017 CLINICAL DATA:  Foot pain and swelling. EXAM: MRI OF THE RIGHT FOREFOOT WITHOUT CONTRAST TECHNIQUE: Multiplanar, multisequence MR imaging of the right forefoot was performed. No intravenous contrast was administered. COMPARISON:  None. FINDINGS: Bones/Joint/Cartilage Soft tissue wound along the plantar aspect of the second toe. No cortical destruction or marrow edema. No fracture or dislocation. Normal alignment. No joint effusion. Mild osteoarthritis of the first MTP joint. Ligaments Collateral ligaments are intact.  Lisfranc ligament  is intact. Muscles and Tendons Flexor, peroneal and extensor compartment tendons are intact. Muscles are normal. Soft tissue No fluid collection or hematoma.  No soft tissue mass. IMPRESSION: 1. No osteomyelitis of the right forefoot. Electronically Signed   By: Kathreen Devoid   On: 09/04/2017 12:57    Anti-infectives: Anti-infectives (From admission, onward)   Start     Dose/Rate Route Frequency Ordered Stop   09/05/17 0000  vancomycin (VANCOCIN) 1,250 mg in sodium chloride 0.9 % 250 mL IVPB     1,250 mg 166.7 mL/hr over 90 Minutes Intravenous Every 18 hours 09/04/17 1455     09/04/17 1245  vancomycin (VANCOCIN) IVPB 1000 mg/200 mL premix     1,000 mg 200 mL/hr over 60 Minutes Intravenous  Once 09/04/17 1238 09/04/17 1545   09/02/17 1800  piperacillin-tazobactam (ZOSYN) IVPB 3.375 g  Status:  Discontinued     3.375 g 12.5 mL/hr over 240 Minutes Intravenous Every 8 hours 09/02/17 1447 09/02/17 1451   09/02/17 1500  Ampicillin-Sulbactam (UNASYN) 3 g in sodium chloride 0.9 % 100 mL IVPB     3 g 200 mL/hr over 30 Minutes Intravenous Every 6 hours 09/02/17 1451     09/02/17 1415  piperacillin-tazobactam (ZOSYN) IVPB 3.375 g  Status:  Discontinued     3.375 g 100 mL/hr over 30 Minutes Intravenous  Once 09/02/17 1405 09/02/17 1513   09/02/17 1415  vancomycin (VANCOCIN) IVPB 1000 mg/200 mL premix  Status:  Discontinued     1,000 mg  200 mL/hr over 60 Minutes Intravenous  Once 09/02/17 1405 09/02/17 1451      Assessment/Plan: s/p * No surgery found * Assessment: Cellulitis with abscess right foot.   Plan: Dressing reapplied to the right foot.  Discussed with the patient that at this point I think we need to have a formal debridement in the operating room to debride any devitalized tissue.  Discussed that this may also require amputation of the second toe due to the fibrotic nonviable tissue along the dorsum of the second toe.  Discussed possible risks and complications of nonhealing due to her  diabetes or infection.  May require additional debridements depending on her response.  No guarantees could be given as to the outcome.  Patient will be n.p.o. now and we will plan for surgery later this evening pending OR availability or potentially tomorrow.  Consent form to be signed in the chart.  LOS: 3 days    Durward Fortes 09/05/2017

## 2017-09-05 NOTE — Clinical Social Work Note (Signed)
Clinical Social Work Assessment  Patient Details  Name: Alyssa Perkins MRN: [id number] Date of Birth: 1950/05/10  Date of referral:  09/05/17               Reason for consult:  Discharge Planning                Permission sought to share information with:  Case Manager Permission granted to share information::  Yes, Verbal Permission Granted  Name::        Agency::     Relationship::     Contact Information:     Housing/Transportation Living arrangements for the past 2 months:  Single Family Home Source of Information:  Patient Patient Interpreter Needed:  None Criminal Activity/Legal Involvement Pertinent to Current Situation/Hospitalization:  No - Comment as needed Significant Relationships:  Adult Children Lives with:  Adult Children Do you feel safe going back to the place where you live?  Yes Need for family participation in patient care:  Yes (Comment)  Care giving concerns:  Patient lives in [place] with her daughter Alyssa Perkins and 2 grandsons.    Social Worker assessment / plan:  Holiday representative (CSW) reviewed chart and noted that patient has gangrene/infection. CSW met with patient alone at bedside to discuss D/C plan. Patient was alert and oriented X4 and was laying in the bed. CSW introduced self and explained role of CSW department. Patient reported that she lives in [place] with her adult daughter and 2 grandsons. CSW explained that PT will evaluate patient and make a recommendation of home health or SNF. Patient prefers to go home and has good family support. RN case manager aware of above. CSW will continue to follow and assist as needed.    Employment status:  Retired Forensic scientist:  Medicare PT Recommendations:  Not assessed at this time [place] / Referral to community resources:  Other (Comment Required)(Patient will D/C home. )  Patient/Family's Response to care:  Patient prefers to go home.   Patient/Family's Understanding of and Emotional  Response to Diagnosis, Current Treatment, and Prognosis:  Patient was very pleasant and thanked CSW for assistance.   Emotional Assessment Appearance:  Appears stated age Attitude/Demeanor/Rapport:    Affect (typically observed):  Accepting, Adaptable, Pleasant Orientation:  Oriented to Self, Oriented to Place, Oriented to  Time, Oriented to Situation Alcohol / Substance use:  Not Applicable Psych involvement (Current and /or in the community):  No (Comment)  Discharge Needs  Concerns to be addressed:  Discharge Planning Concerns Readmission within the last 30 days:  No Current discharge risk:  None Barriers to Discharge:  Continued Medical Work up   [employer], Veronia Beets, LCSW 09/05/2017, 9:57 AM

## 2017-09-05 NOTE — Interval H&P Note (Signed)
History and Physical Interval Note:  09/05/2017 7:41 PM  Alyssa Perkins  has presented today for surgery, with the diagnosis of N/A  The various methods of treatment have been discussed with the patient and family. After consideration of risks, benefits and other options for treatment, the patient has consented to  Procedure(s): AMPUTATION TOE-RIGHT 2ND TOE (Right) as a surgical intervention .  The patient's history has been reviewed, patient examined, no change in status, stable for surgery.  I have reviewed the patient's chart and labs.  Questions were answered to the patient's satisfaction.     Durward Fortes

## 2017-09-06 ENCOUNTER — Encounter: Payer: Self-pay | Admitting: Podiatry

## 2017-09-06 LAB — BASIC METABOLIC PANEL
ANION GAP: 9 (ref 5–15)
BUN: 15 mg/dL (ref 6–20)
CO2: 24 mmol/L (ref 22–32)
Calcium: 8.3 mg/dL — ABNORMAL LOW (ref 8.9–10.3)
Chloride: 103 mmol/L (ref 101–111)
Creatinine, Ser: 0.88 mg/dL (ref 0.44–1.00)
Glucose, Bld: 348 mg/dL — ABNORMAL HIGH (ref 65–99)
POTASSIUM: 4.7 mmol/L (ref 3.5–5.1)
Sodium: 136 mmol/L (ref 135–145)

## 2017-09-06 LAB — GLUCOSE, CAPILLARY
GLUCOSE-CAPILLARY: 293 mg/dL — AB (ref 65–99)
GLUCOSE-CAPILLARY: 367 mg/dL — AB (ref 65–99)
GLUCOSE-CAPILLARY: 234 mg/dL — AB (ref 65–99)
GLUCOSE-CAPILLARY: 215 mg/dL — AB (ref 65–99)
Glucose-Capillary: 238 mg/dL — ABNORMAL HIGH (ref 65–99)

## 2017-09-06 NOTE — Progress Notes (Signed)
1 Day Post-Op   Subjective/Chief Complaint: Patient seen.  States that the pain has improved as compared to prior to her surgery.   Objective: Vital signs in last 24 hours: Temp:  [97.3 F (36.3 C)-98.1 F (36.7 C)] 97.6 F (36.4 C) (06/22 0729) Pulse Rate:  [66-77] 68 (06/22 0729) Resp:  [13-17] 16 (06/22 0440) BP: (100-155)/(52-76) 111/57 (06/22 0729) SpO2:  [93 %-100 %] 95 % (06/22 0729) Last BM Date: 09/04/17  Intake/Output from previous day: 06/21 0701 - 06/22 0700 In: 760 [P.O.:360; I.V.:400] Out: -  Intake/Output this shift: Total I/O In: 240 [P.O.:240] Out: -   The bandage is dry and intact.  No evidence of any strikethrough.  Lab Results:  Recent Labs    09/04/17 0338  WBC 10.8  HGB 13.4  HCT 39.7  PLT 251   BMET Recent Labs    09/05/17 0253 09/06/17 0512  NA 138 136  K 3.6 4.7  CL 103 103  CO2 26 24  GLUCOSE 313* 348*  BUN 14 15  CREATININE 0.98 0.88  CALCIUM 8.0* 8.3*   PT/INR No results for input(s): LABPROT, INR in the last 72 hours. ABG No results for input(s): PHART, HCO3 in the last 72 hours.  Invalid input(s): PCO2, PO2  Studies/Results: Mr Foot Right Wo Contrast  Result Date: 09/04/2017 CLINICAL DATA:  Foot pain and swelling. EXAM: MRI OF THE RIGHT FOREFOOT WITHOUT CONTRAST TECHNIQUE: Multiplanar, multisequence MR imaging of the right forefoot was performed. No intravenous contrast was administered. COMPARISON:  None. FINDINGS: Bones/Joint/Cartilage Soft tissue wound along the plantar aspect of the second toe. No cortical destruction or marrow edema. No fracture or dislocation. Normal alignment. No joint effusion. Mild osteoarthritis of the first MTP joint. Ligaments Collateral ligaments are intact.  Lisfranc ligament is intact. Muscles and Tendons Flexor, peroneal and extensor compartment tendons are intact. Muscles are normal. Soft tissue No fluid collection or hematoma.  No soft tissue mass. IMPRESSION: 1. No osteomyelitis of the  right forefoot. Electronically Signed   By: Kathreen Devoid   On: 09/04/2017 12:57    Anti-infectives: Anti-infectives (From admission, onward)   Start     Dose/Rate Route Frequency Ordered Stop   09/05/17 0000  vancomycin (VANCOCIN) 1,250 mg in sodium chloride 0.9 % 250 mL IVPB     1,250 mg 166.7 mL/hr over 90 Minutes Intravenous Every 18 hours 09/04/17 1455     09/04/17 1245  vancomycin (VANCOCIN) IVPB 1000 mg/200 mL premix     1,000 mg 200 mL/hr over 60 Minutes Intravenous  Once 09/04/17 1238 09/04/17 1545   09/02/17 1800  piperacillin-tazobactam (ZOSYN) IVPB 3.375 g  Status:  Discontinued     3.375 g 12.5 mL/hr over 240 Minutes Intravenous Every 8 hours 09/02/17 1447 09/02/17 1451   09/02/17 1500  Ampicillin-Sulbactam (UNASYN) 3 g in sodium chloride 0.9 % 100 mL IVPB  Status:  Discontinued     3 g 200 mL/hr over 30 Minutes Intravenous Every 6 hours 09/02/17 1451 09/05/17 1340   09/02/17 1415  piperacillin-tazobactam (ZOSYN) IVPB 3.375 g  Status:  Discontinued     3.375 g 100 mL/hr over 30 Minutes Intravenous  Once 09/02/17 1405 09/02/17 1513   09/02/17 1415  vancomycin (VANCOCIN) IVPB 1000 mg/200 mL premix  Status:  Discontinued     1,000 mg 200 mL/hr over 60 Minutes Intravenous  Once 09/02/17 1405 09/02/17 1451      Assessment/Plan: s/p Procedure(s): AMPUTATION TOE-RIGHT 2ND TOE (Right) IRRIGATION AND DEBRIDEMENT FOOT Assessment: Stable status  post amputation right second toe.   Plan: Dressing left intact.  Repeat CBC tomorrow and we will assess the wound tomorrow.  LOS: 4 days    Durward Fortes 09/06/2017

## 2017-09-06 NOTE — Progress Notes (Signed)
[facility] [place] NAME: Alyssa Perkins    MR#:  [id number]  DATE OF BIRTH:  1950/04/12  SUBJECTIVE:  CHIEF COMPLAINT:   Chief Complaint  Patient presents with  . Toe Injury   -And had amputation of the right second toe for abscess and early gangrenous changes. -Complains of some pain.  Dressing in place.  REVIEW OF SYSTEMS:  Review of Systems  Constitutional: Negative for chills, fever and malaise/fatigue.  HENT: Negative for hearing loss.   Eyes: Negative for blurred vision and double vision.  Respiratory: Negative for cough, shortness of breath and wheezing.   Cardiovascular: Negative for chest pain, palpitations and leg swelling.  Gastrointestinal: Negative for abdominal pain, constipation, diarrhea, nausea and vomiting.  Genitourinary: Negative for dysuria.  Musculoskeletal: Positive for joint pain and myalgias. Negative for falls.  Neurological: Negative for dizziness, focal weakness, seizures, weakness and headaches.  Psychiatric/Behavioral: Negative for depression.    DRUG ALLERGIES:  No Active Allergies  VITALS:  Blood pressure (!) 111/57, pulse 68, temperature 97.6 F (36.4 C), resp. rate 16, height 5\' 3"  (1.6 m), weight 97.1 kg (214 lb), SpO2 95 %.  PHYSICAL EXAMINATION:  Physical Exam   GENERAL:  67 y.o.-year-old patient lying in the bed with no acute distress.  EYES: Pupils equal, round, reactive to light and accommodation. No scleral icterus. Extraocular muscles intact.  HEENT: Head atraumatic, normocephalic. Oropharynx and nasopharynx clear.  NECK:  Supple, no jugular venous distention. No thyroid enlargement, no tenderness.  LUNGS: Normal breath sounds bilaterally, no wheezing, rales,rhonchi or crepitation. No use of accessory muscles of respiration.  CARDIOVASCULAR: S1, S2 normal. No murmurs, rubs, or gallops.  ABDOMEN: Soft, nontender, nondistended. Bowel sounds present. No organomegaly or mass.    EXTREMITIES: Bulky dressing to right foot. No pedal edema, cyanosis, or clubbing.  NEUROLOGIC: Cranial nerves II through XII are intact. Muscle strength 5/5 in all extremities. Sensation intact. Gait not checked.  PSYCHIATRIC: The patient is alert and oriented x 3.  SKIN: No obvious rash, lesion, or ulcer.    LABORATORY PANEL:   CBC Recent Labs  Lab 09/04/17 0338  WBC 10.8  HGB 13.4  HCT 39.7  PLT 251   ------------------------------------------------------------------------------------------------------------------  Chemistries  Recent Labs  Lab 09/02/17 1250  09/06/17 0512  NA 138   < > 136  K 4.2   < > 4.7  CL 103   < > 103  CO2 25   < > 24  GLUCOSE 248*   < > 348*  BUN 11   < > 15  CREATININE 0.94   < > 0.88  CALCIUM 8.9   < > 8.3*  AST 24  --   --   ALT 16  --   --   ALKPHOS 126  --   --   BILITOT 1.0  --   --    < > = values in this interval not displayed.   ------------------------------------------------------------------------------------------------------------------  Cardiac Enzymes No results for input(s): TROPONINI in the last 168 hours. ------------------------------------------------------------------------------------------------------------------  RADIOLOGY:  Mr Foot Right Wo Contrast  Result Date: 09/04/2017 CLINICAL DATA:  Foot pain and swelling. EXAM: MRI OF THE RIGHT FOREFOOT WITHOUT CONTRAST TECHNIQUE: Multiplanar, multisequence MR imaging of the right forefoot was performed. No intravenous contrast was administered. COMPARISON:  None. FINDINGS: Bones/Joint/Cartilage Soft tissue wound along the plantar aspect of the second toe. No cortical destruction or marrow edema. No fracture or dislocation. Normal alignment. No joint effusion. Mild osteoarthritis  of the first MTP joint. Ligaments Collateral ligaments are intact.  Lisfranc ligament is intact. Muscles and Tendons Flexor, peroneal and extensor compartment tendons are intact. Muscles are normal.  Soft tissue No fluid collection or hematoma.  No soft tissue mass. IMPRESSION: 1. No osteomyelitis of the right forefoot. Electronically Signed   By: Kathreen Devoid   On: 09/04/2017 12:57    EKG:   Orders placed or performed during the hospital encounter of 10/01/15  . ED EKG within 10 minutes  . ED EKG within 10 minutes  . EKG    ASSESSMENT AND PLAN:   67 year old female with poorly controlled diabetes mellitus, arthritis, IBS, sleep apnea, GERD and chronic back pain presents to hospital secondary to worsening right foot swelling and right toe gangrene.  1.  Right foot cellulitis with right second toe gangrene/infection -Appreciate podiatry and vascular consults. -Patient has good pedal pulses, so will not need an angiogram. -on IV vancomycin as wound cultures growing MRSA -MRI of the foot showed no osteomyelitis -Status post amputation of right second toe.  Await podiatry input about disposition-based on weightbearing, dressing changes and antibiotics  2.  Uncontrolled diabetes mellitus-A1c of 10 -Very noncompliant with diet -on Lantus and also on sliding scale insulin -adjust doses as tolerated  3.  GERD-on Protonix  4.  Hypertension-on quinapril  5.  DVT prophylaxis-Lovenox    All the records are reviewed and case discussed with Care Management/Social Workerr. Management plans discussed with the patient, family and they are in agreement.  CODE STATUS: Full Code  TOTAL TIME TAKING CARE OF THIS PATIENT: 36 minutes.   POSSIBLE D/C IN 1-2 DAYS, DEPENDING ON CLINICAL CONDITION.   Gladstone Lighter M.D on 09/06/2017 at 8:18 AM  Between 7am to 6pm - Pager - [phone]  After 6pm go to [email or web address] - password [email or web address] [facility]  Office  [phone]  CC: Primary care physician; [name], DO

## 2017-09-07 LAB — CULTURE, BLOOD (ROUTINE X 2)
Culture: NO GROWTH
Culture: NO GROWTH
Special Requests: ADEQUATE
Special Requests: ADEQUATE

## 2017-09-07 LAB — CBC WITH DIFFERENTIAL/PLATELET
BASOS ABS: 0.1 10*3/uL (ref 0–0.1)
Basophils Relative: 1 %
Eosinophils Absolute: 0.2 10*3/uL (ref 0–0.7)
Eosinophils Relative: 2 %
HEMATOCRIT: 36.5 % (ref 35.0–47.0)
Hemoglobin: 12.4 g/dL (ref 12.0–16.0)
LYMPHS ABS: 3.1 10*3/uL (ref 1.0–3.6)
Lymphocytes Relative: 28 %
MCH: 27.9 pg (ref 26.0–34.0)
MCHC: 33.9 g/dL (ref 32.0–36.0)
MCV: 82.2 fL (ref 80.0–100.0)
MONO ABS: 0.5 10*3/uL (ref 0.2–0.9)
MONOS PCT: 4 %
NEUTROS ABS: 7.3 10*3/uL — AB (ref 1.4–6.5)
Neutrophils Relative %: 65 %
Platelets: 237 10*3/uL (ref 150–440)
RBC: 4.44 MIL/uL (ref 3.80–5.20)
RDW: 14.6 % — AB (ref 11.5–14.5)
WBC: 11.2 10*3/uL — ABNORMAL HIGH (ref 3.6–11.0)

## 2017-09-07 LAB — VANCOMYCIN, TROUGH: Vancomycin Tr: 9 ug/mL — ABNORMAL LOW (ref 15–20)

## 2017-09-07 LAB — GLUCOSE, CAPILLARY
Glucose-Capillary: 194 mg/dL — ABNORMAL HIGH (ref 65–99)
Glucose-Capillary: 195 mg/dL — ABNORMAL HIGH (ref 65–99)

## 2017-09-07 MED ORDER — OXYCODONE-ACETAMINOPHEN 5-325 MG PO TABS: 1.0000 | ORAL_TABLET | Freq: Four times a day (QID) | ORAL | 0 refills | Status: DC | PRN

## 2017-09-07 MED ORDER — SULFAMETHOXAZOLE-TRIMETHOPRIM 800-160 MG PO TABS: 1.0000 | ORAL_TABLET | Freq: Two times a day (BID) | ORAL | 0 refills | Status: AC

## 2017-09-07 MED ORDER — SODIUM CHLORIDE 0.9 % IV SOLN
1250.0000 mg | Freq: Two times a day (BID) | INTRAVENOUS | Status: DC
  Filled 2017-09-07: qty 1250

## 2017-09-07 NOTE — Progress Notes (Signed)
2 Days Post-Op   Subjective/Chief Complaint: Patient seen.  Experiencing a little more pain today than yesterday.   Objective: Vital signs in last 24 hours: Temp:  [97.5 F (36.4 C)-98.3 F (36.8 C)] 97.5 F (36.4 C) (06/23 0746) Pulse Rate:  [61-79] 61 (06/23 0746) Resp:  [17-19] 19 (06/23 0404) BP: (101-139)/(57-82) 122/70 (06/23 0746) SpO2:  [96 %-99 %] 96 % (06/23 0746) Last BM Date: 09/04/17  Intake/Output from previous day: 06/22 0701 - 06/23 0700 In: 490 [P.O.:240; IV Piggyback:250] Out: -  Intake/Output this shift: No intake/output data recorded.  The bandage on the right foot is dry and intact.  Upon removal there is only mild bleeding from the surgical site.  Significant improvement in the erythema.  No purulence or signs of incisional necrosis.  Lab Results:  Recent Labs    09/07/17 0519  WBC 11.2*  HGB 12.4  HCT 36.5  PLT 237   BMET Recent Labs    09/05/17 0253 09/06/17 0512  NA 138 136  K 3.6 4.7  CL 103 103  CO2 26 24  GLUCOSE 313* 348*  BUN 14 15  CREATININE 0.98 0.88  CALCIUM 8.0* 8.3*   PT/INR No results for input(s): LABPROT, INR in the last 72 hours. ABG No results for input(s): PHART, HCO3 in the last 72 hours.  Invalid input(s): PCO2, PO2  Studies/Results: No results found.  Anti-infectives: Anti-infectives (From admission, onward)   Start     Dose/Rate Route Frequency Ordered Stop   09/07/17 1700  vancomycin (VANCOCIN) 1,250 mg in sodium chloride 0.9 % 250 mL IVPB     1,250 mg 166.7 mL/hr over 90 Minutes Intravenous Every 12 hours 09/07/17 0633     09/05/17 0000  vancomycin (VANCOCIN) 1,250 mg in sodium chloride 0.9 % 250 mL IVPB  Status:  Discontinued     1,250 mg 166.7 mL/hr over 90 Minutes Intravenous Every 18 hours 09/04/17 1455 09/07/17 0633   09/04/17 1245  vancomycin (VANCOCIN) IVPB 1000 mg/200 mL premix     1,000 mg 200 mL/hr over 60 Minutes Intravenous  Once 09/04/17 1238 09/04/17 1545   09/02/17 1800   piperacillin-tazobactam (ZOSYN) IVPB 3.375 g  Status:  Discontinued     3.375 g 12.5 mL/hr over 240 Minutes Intravenous Every 8 hours 09/02/17 1447 09/02/17 1451   09/02/17 1500  Ampicillin-Sulbactam (UNASYN) 3 g in sodium chloride 0.9 % 100 mL IVPB  Status:  Discontinued     3 g 200 mL/hr over 30 Minutes Intravenous Every 6 hours 09/02/17 1451 09/05/17 1340   09/02/17 1415  piperacillin-tazobactam (ZOSYN) IVPB 3.375 g  Status:  Discontinued     3.375 g 100 mL/hr over 30 Minutes Intravenous  Once 09/02/17 1405 09/02/17 1513   09/02/17 1415  vancomycin (VANCOCIN) IVPB 1000 mg/200 mL premix  Status:  Discontinued     1,000 mg 200 mL/hr over 60 Minutes Intravenous  Once 09/02/17 1405 09/02/17 1451      Assessment/Plan: s/p Procedure(s): AMPUTATION TOE-RIGHT 2ND TOE (Right) IRRIGATION AND DEBRIDEMENT FOOT Assessment: Stable status post amputation right second toe.   Plan: Betadine and a sterile bandage applied to the right foot.  Discussed with the patient that she should be stable for discharge today on oral antibiotics.  I will plan to follow-up with her in the office on Wednesday.  She will keep the bandage clean dry and intact until then.  Instructed on how to walk with keeping pressure only on her heel and her wedge surgical shoe.  Follow-up outpatient at [facility] later this week  LOS: 5 days    Durward Fortes 09/07/2017

## 2017-09-07 NOTE — Discharge Summary (Signed)
[facility] [place] NAME: Alyssa Perkins    MR#:  [id number]  DATE OF BIRTH:  1950/06/24  DATE OF ADMISSION:  09/02/2017   ADMITTING PHYSICIAN: Fritzi Mandes, MD  DATE OF DISCHARGE:  09/07/17  PRIMARY CARE PHYSICIAN: [name], DO   ADMISSION DIAGNOSIS:   Cellulitis of right lower extremity [U23.536]  DISCHARGE DIAGNOSIS:   Active Problems:   Gangrene associated with diabetes mellitus ([place])   SECONDARY DIAGNOSIS:   Past Medical History:  Diagnosis Date  . Anginal pain ([place])   . Anxiety    not on any medication at this time  . Arthritis   . Asthma    "sports asthma or exercised induced"seen at [facility] for primary  . Chronic back pain   . Chronic neck pain   . Complication of anesthesia    "difficulty waking up from surgery"  . Depression    has OCD, states counts constantly  . GERD (gastroesophageal reflux disease)   . Headache(784.0)    hx of migraines  . Hyperlipidemia   . IBS (irritable bowel syndrome)   . PONV (postoperative nausea and vomiting)   . RLS (restless legs syndrome)   . Sleep apnea   . Thyroid disease     HOSPITAL COURSE:   67 year old female with poorly controlled diabetes mellitus, arthritis, IBS, sleep apnea, GERD and chronic back pain presents to hospital secondary to worsening right foot swelling and right toe gangrene.  1.  Right foot cellulitis with right second toe gangrene/infection -Appreciate podiatry and vascular consults. -Patient has good pedal pulses, so no angiogram done -received  IV vancomycin as wound cultures growing MRSA -MRI of the foot showed no osteomyelitis -Status post amputation of right second toe.  -Improved healing noted.  Dressing in place.  Follow-up in 3 days and dressing change recommended. -Discharged on oral Bactrim. -keep the bandage clean dry and intact.  Instructed on how to walk with keeping pressure only on her heel and  her wedge surgical shoe  2.  Uncontrolled diabetes mellitus-A1c of 10 -Very noncompliant with diet -on Lantus and also on sliding scale insulin -adjust doses as tolerated  3.  GERD-on Prilosec  4.  Hypertension-on quinapril  Will be discharged home today  DISCHARGE CONDITIONS:   Guarded  CONSULTS OBTAINED:   Treatment Team:  Albertine Patricia, DPM  DRUG ALLERGIES:   No Active Allergies DISCHARGE MEDICATIONS:   Allergies as of 09/07/2017   No Active Allergies     Medication List    TAKE these medications   doxylamine (Sleep) 25 MG tablet Commonly known as:  UNISOM Take 25 mg by mouth at bedtime as needed for sleep.   freestyle lancets Use as instructed   gabapentin 100 MG capsule Commonly known as:  NEURONTIN Take 4 capsules in AM and 4 capsules in PM What changed:    how much to take  how to take this  when to take this  additional instructions   glucose blood test strip Commonly known as:  FREESTYLE TEST STRIPS Use as instructed   glucose monitoring kit monitoring kit 1 each by Does not apply route as needed for other. CHeck blood glucose once daily before insulin injections. For ICD 10 E11.65   hydrOXYzine 25 MG tablet Commonly known as:  ATARAX/VISTARIL Take 1 tablet (25 mg total) by mouth daily as needed for itching. For itching   ibuprofen 200 MG tablet Commonly known as:  ADVIL,MOTRIN Take  600-800 mg by mouth every 6 (six) hours as needed for moderate pain.   Insulin Glargine 300 UNIT/ML Sopn Commonly known as:  TOUJEO SOLOSTAR Inject 52 Units into the skin daily. Adjust dose as advised What changed:    how much to take  additional instructions   Insulin Pen Needle 31G X 5 MM Misc 1 each by Does not apply route daily. Use to inject Toujeo daily. Dx:E11.9   Medical Compression Stockings Misc 1 each by Does not apply route daily.   omeprazole 20 MG capsule Commonly known as:  PRILOSEC Take 1 capsule (20 mg total) by mouth  daily.   oxyCODONE-acetaminophen 5-325 MG tablet Commonly known as:  PERCOCET/ROXICET Take 1 tablet by mouth every 6 (six) hours as needed for moderate pain or severe pain.   quinapril 20 MG tablet Commonly known as:  ACCUPRIL Take 1 tablet (20 mg total) by mouth at bedtime.   sertraline 50 MG tablet Commonly known as:  ZOLOFT Take 1 tablet (50 mg total) by mouth daily.   simvastatin 40 MG tablet Commonly known as:  ZOCOR Take 1 tablet (40 mg total) by mouth at bedtime. Reported on 05/16/2015   sulfamethoxazole-trimethoprim 800-160 MG tablet Commonly known as:  BACTRIM DS,SEPTRA DS Take 1 tablet by mouth 2 (two) times daily for 10 days.        DISCHARGE INSTRUCTIONS:   1.  PCP follow-up in 1 to 2 weeks 2.  Podiatry follow-up in 3 days  DIET:   Cardiac diet  ACTIVITY:   Activity as tolerated  OXYGEN:   Home Oxygen: No.  Oxygen Delivery: room air  DISCHARGE LOCATION:   home   If you experience worsening of your admission symptoms, develop shortness of breath, life threatening emergency, suicidal or homicidal thoughts you must seek medical attention immediately by calling 911 or calling your MD immediately  if symptoms less severe.  You Must read complete instructions/literature along with all the possible adverse reactions/side effects for all the Medicines you take and that have been prescribed to you. Take any new Medicines after you have completely understood and accpet all the possible adverse reactions/side effects.   Please note  You were cared for by a hospitalist during your hospital stay. If you have any questions about your discharge medications or the care you received while you were in the hospital after you are discharged, you can call the unit and asked to speak with the hospitalist on call if the hospitalist that took care of you is not available. Once you are discharged, your primary care physician will handle any further medical issues. Please note  that NO REFILLS for any discharge medications will be authorized once you are discharged, as it is imperative that you return to your primary care physician (or establish a relationship with a primary care physician if you do not have one) for your aftercare needs so that they can reassess your need for medications and monitor your lab values.    On the day of Discharge:  VITAL SIGNS:   Blood pressure 114/69, pulse 65, temperature 98 F (36.7 C), temperature source Oral, resp. rate 19, height '5\' 3"'$  (1.6 m), weight 97.1 kg (214 lb), SpO2 99 %.  PHYSICAL EXAMINATION:    GENERAL:  67 y.o.-year-old patient lying in the bed with no acute distress.  EYES: Pupils equal, round, reactive to light and accommodation. No scleral icterus. Extraocular muscles intact.  HEENT: Head atraumatic, normocephalic. Oropharynx and nasopharynx clear.  NECK:  Supple, no  jugular venous distention. No thyroid enlargement, no tenderness.  LUNGS: Normal breath sounds bilaterally, no wheezing, rales,rhonchi or crepitation. No use of accessory muscles of respiration.  CARDIOVASCULAR: S1, S2 normal. No murmurs, rubs, or gallops.  ABDOMEN: Soft, nontender, nondistended. Bowel sounds present. No organomegaly or mass.  EXTREMITIES: Bulky dressing to right foot. No pedal edema, cyanosis, or clubbing.  NEUROLOGIC: Cranial nerves II through XII are intact. Muscle strength 5/5 in all extremities. Sensation intact. Gait not checked.  PSYCHIATRIC: The patient is alert and oriented x 3.  SKIN: No obvious rash, lesion, or ulcer.    DATA REVIEW:   CBC Recent Labs  Lab 09/07/17 0519  WBC 11.2*  HGB 12.4  HCT 36.5  PLT 237    Chemistries  Recent Labs  Lab 09/02/17 1250  09/06/17 0512  NA 138   < > 136  K 4.2   < > 4.7  CL 103   < > 103  CO2 25   < > 24  GLUCOSE 248*   < > 348*  BUN 11   < > 15  CREATININE 0.94   < > 0.88  CALCIUM 8.9   < > 8.3*  AST 24  --   --   ALT 16  --   --   ALKPHOS 126  --   --     BILITOT 1.0  --   --    < > = values in this interval not displayed.     Microbiology Results  Results for orders placed or performed during the hospital encounter of 09/02/17  Blood Culture (routine x 2)     Status: None   Collection Time: 09/02/17  2:27 PM  Result Value Ref Range Status   Specimen Description BLOOD RIGHT ANTECUBITAL  Final   Special Requests   Final    BOTTLES DRAWN AEROBIC AND ANAEROBIC Blood Culture adequate volume   Culture   Final    NO GROWTH 5 DAYS Performed at [facility], [address] [place], [place] [address]    Report Status 09/07/2017 FINAL  Final  Blood Culture (routine x 2)     Status: None   Collection Time: 09/02/17  2:28 PM  Result Value Ref Range Status   Specimen Description BLOOD BLOOD RIGHT FOREARM  Final   Special Requests   Final    BOTTLES DRAWN AEROBIC AND ANAEROBIC Blood Culture adequate volume   Culture   Final    NO GROWTH 5 DAYS Performed at [facility], [address] [place], [place] [address]    Report Status 09/07/2017 FINAL  Final  Aerobic/Anaerobic Culture (surgical/deep wound)     Status: None (Preliminary result)   Collection Time: 09/02/17  8:13 PM  Result Value Ref Range Status   Specimen Description   Final    TISSUE Performed at [facility], [address] [place], [place] [address]    Special Requests RECEIVED A SWAB  Final   Gram Stain   Final    RARE WBC PRESENT, PREDOMINANTLY PMN MODERATE GRAM POSITIVE COCCI Performed at [facility], [place] [address] [place],  [address]    Culture   Final    ABUNDANT METHICILLIN RESISTANT STAPHYLOCOCCUS AUREUS NO ANAEROBES ISOLATED; CULTURE IN PROGRESS FOR 5 DAYS    Report Status PENDING  Incomplete   Organism ID, Bacteria METHICILLIN RESISTANT STAPHYLOCOCCUS AUREUS  Final      Susceptibility   Methicillin resistant staphylococcus aureus - MIC*  CIPROFLOXACIN <=0.5 SENSITIVE Sensitive     ERYTHROMYCIN >=8 RESISTANT  Resistant     GENTAMICIN <=0.5 SENSITIVE Sensitive     OXACILLIN >=4 RESISTANT Resistant     TETRACYCLINE <=1 SENSITIVE Sensitive     VANCOMYCIN 1 SENSITIVE Sensitive     TRIMETH/SULFA <=10 SENSITIVE Sensitive     CLINDAMYCIN <=0.25 SENSITIVE Sensitive     RIFAMPIN <=0.5 SENSITIVE Sensitive     Inducible Clindamycin NEGATIVE Sensitive     * ABUNDANT METHICILLIN RESISTANT STAPHYLOCOCCUS AUREUS    RADIOLOGY:  No results found.   Management plans discussed with the patient, family and they are in agreement.  CODE STATUS:     Code Status Orders  (From admission, onward)        Start     Ordered   09/02/17 1600  Full code  Continuous     09/02/17 1559    Code Status History    This patient has a current code status but no historical code status.      TOTAL TIME TAKING CARE OF THIS PATIENT: 38 minutes.    Gladstone Lighter M.D on 09/07/2017 at 1:31 PM  Between 7am to 6pm - Pager - [phone]  After 6pm go to [email or web address] - Proofreader  [facility]  Office  [phone]  CC: Primary care physician; [name], DO   Note: This dictation was prepared with Dragon dictation along with smaller phrase technology. Any transcriptional errors that result from this process are unintentional.

## 2017-09-07 NOTE — Progress Notes (Signed)
ANTIBIOTIC CONSULT NOTE - INITIAL  Pharmacy Consult for Vancomycin  Indication: wound abscess  No Active Allergies  Patient Measurements: Height: '5\' 3"'$  (160 cm) Weight: 214 lb (97.1 kg) IBW/kg (Calculated) : 52.4 Adjusted Body Weight:   Vital Signs: Temp: 97.6 F (36.4 C) (06/23 0404) Temp Source: Oral (06/23 0404) BP: 116/57 (06/23 0404) Pulse Rate: 64 (06/23 0404) Intake/Output from previous day: 06/22 0701 - 06/23 0700 In: 490 [P.O.:240; IV Piggyback:250] Out: -  Intake/Output from this shift: No intake/output data recorded.  Labs: Recent Labs    09/05/17 0253 09/06/17 0512 09/07/17 0519  WBC  --   --  11.2*  HGB  --   --  12.4  PLT  --   --  237  CREATININE 0.98 0.88  --    Estimated Creatinine Clearance: 69.8 mL/min (by C-G formula based on SCr of 0.88 mg/dL). Recent Labs    09/07/17 0519  [place] 9*     Microbiology: Recent Results (from the past 720 hour(s))  Blood Culture (routine x 2)     Status: None   Collection Time: 09/02/17  2:27 PM  Result Value Ref Range Status   Specimen Description BLOOD RIGHT ANTECUBITAL  Final   Special Requests   Final    BOTTLES DRAWN AEROBIC AND ANAEROBIC Blood Culture adequate volume   Culture   Final    NO GROWTH 5 DAYS Performed at [facility], [address], [place], [place] [address]    Report Status 09/07/2017 FINAL  Final  Blood Culture (routine x 2)     Status: None   Collection Time: 09/02/17  2:28 PM  Result Value Ref Range Status   Specimen Description BLOOD BLOOD RIGHT FOREARM  Final   Special Requests   Final    BOTTLES DRAWN AEROBIC AND ANAEROBIC Blood Culture adequate volume   Culture   Final    NO GROWTH 5 DAYS Performed at [facility], [address] [place], [place] [address]    Report Status 09/07/2017 FINAL  Final  Aerobic/Anaerobic Culture (surgical/deep wound)     Status: None (Preliminary result)   Collection Time: 09/02/17  8:13 PM  Result Value Ref Range  Status   Specimen Description   Final    TISSUE Performed at [facility], [address], [place], [place] [address]    Special Requests RECEIVED A SWAB  Final   Gram Stain   Final    RARE WBC PRESENT, PREDOMINANTLY PMN MODERATE GRAM POSITIVE COCCI Performed at [facility], [place] [address] [place],  [address]    Culture   Final    ABUNDANT METHICILLIN RESISTANT STAPHYLOCOCCUS AUREUS NO ANAEROBES ISOLATED; CULTURE IN PROGRESS FOR 5 DAYS    Report Status PENDING  Incomplete   Organism ID, Bacteria METHICILLIN RESISTANT STAPHYLOCOCCUS AUREUS  Final      Susceptibility   Methicillin resistant staphylococcus aureus - MIC*    CIPROFLOXACIN <=0.5 SENSITIVE Sensitive     ERYTHROMYCIN >=8 RESISTANT Resistant     GENTAMICIN <=0.5 SENSITIVE Sensitive     OXACILLIN >=4 RESISTANT Resistant     TETRACYCLINE <=1 SENSITIVE Sensitive     VANCOMYCIN 1 SENSITIVE Sensitive     TRIMETH/SULFA <=10 SENSITIVE Sensitive     CLINDAMYCIN <=0.25 SENSITIVE Sensitive     RIFAMPIN <=0.5 SENSITIVE Sensitive     Inducible Clindamycin NEGATIVE Sensitive     * ABUNDANT METHICILLIN RESISTANT STAPHYLOCOCCUS AUREUS    Medical History: Past Medical History:  Diagnosis Date  .  Anginal pain ([place])   . Anxiety    not on any medication at this time  . Arthritis   . Asthma    "sports asthma or exercised induced"seen at [facility] for primary  . Chronic back pain   . Chronic neck pain   . Complication of anesthesia    "difficulty waking up from surgery"  . Depression    has OCD, states counts constantly  . GERD (gastroesophageal reflux disease)   . Headache(784.0)    hx of migraines  . Hyperlipidemia   . IBS (irritable bowel syndrome)   . PONV (postoperative nausea and vomiting)   . RLS (restless legs syndrome)   . Sleep apnea   . Thyroid disease     Medications:  Medications Prior to Admission  Medication Sig Dispense Refill Last Dose  . doxylamine, Sleep,  (UNISOM) 25 MG tablet Take 25 mg by mouth at bedtime as needed for sleep.   PRN at PRN  . Elastic Bandages & Supports (MEDICAL COMPRESSION STOCKINGS) MISC 1 each by Does not apply route daily. 2 each 0 Taking  . gabapentin (NEURONTIN) 100 MG capsule Take 4 capsules in AM and 4 capsules in PM (Patient taking differently: Take 400 mg by mouth at bedtime. ) 720 capsule 3 09/01/2017 at 2200  . glucose blood (FREESTYLE TEST STRIPS) test strip Use as instructed 300 each 3 Taking  . glucose monitoring kit (FREESTYLE) monitoring kit 1 each by Does not apply route as needed for other. CHeck blood glucose once daily before insulin injections. For ICD 10 E11.65 1 each 0 Taking  . hydrOXYzine (ATARAX/VISTARIL) 25 MG tablet Take 1 tablet (25 mg total) by mouth daily as needed for itching. For itching 90 tablet 1 PRN at PRN  . ibuprofen (ADVIL,MOTRIN) 200 MG tablet Take 600-800 mg by mouth every 6 (six) hours as needed for moderate pain.   PRN at PRN  . Insulin Glargine (TOUJEO SOLOSTAR) 300 UNIT/ML SOPN Inject 52 Units into the skin daily. Adjust dose as advised (Patient taking differently: Inject 78 Units into the skin daily. ) 15 mL 3 09/02/2017 at 1100  . Insulin Pen Needle 31G X 5 MM MISC 1 each by Does not apply route daily. Use to inject Toujeo daily. Dx:E11.9 100 each 0 Taking  . Lancets (FREESTYLE) lancets Use as instructed 300 each 3 Taking  . omeprazole (PRILOSEC) 20 MG capsule Take 1 capsule (20 mg total) by mouth daily. 90 capsule 3 09/01/2017 at 2200  . quinapril (ACCUPRIL) 20 MG tablet Take 1 tablet (20 mg total) by mouth at bedtime. 90 tablet 3 09/01/2017 at 2200  . sertraline (ZOLOFT) 50 MG tablet Take 1 tablet (50 mg total) by mouth daily. 90 tablet 3 09/01/2017 at 2200  . simvastatin (ZOCOR) 40 MG tablet Take 1 tablet (40 mg total) by mouth at bedtime. Reported on 05/16/2015 90 tablet 3 09/01/2017 at 2200   Assessment:   Goal of Therapy:  Vancomycin trough level 15-20 mcg/ml  Plan:  Expected  duration 7 days with resolution of temperature and/or normalization of WBC   Pt was on Vancomycin 1250 mg IV Q18H  6/23:  VT @ 0530 = 9 mcg/mL Will increase dose to Vancomycin 1250 IV Q12H and recheck VT before 3rd new dose on 6/24 @ 16:30.     Nathanal Hermiz D 09/07/2017,6:34 AM

## 2017-09-07 NOTE — Progress Notes (Signed)
Alyssa Perkins to be D/C'd Home per MD order.  Discussed prescriptions and follow up appointments with the patient. Prescriptions given to patient, medication list explained in detail. Pt verbalized understanding.  Allergies as of 09/07/2017   No Active Allergies     Medication List    TAKE these medications   doxylamine (Sleep) 25 MG tablet Commonly known as:  UNISOM Take 25 mg by mouth at bedtime as needed for sleep.   freestyle lancets Use as instructed   gabapentin 100 MG capsule Commonly known as:  NEURONTIN Take 4 capsules in AM and 4 capsules in PM What changed:    how much to take  how to take this  when to take this  additional instructions   glucose blood test strip Commonly known as:  FREESTYLE TEST STRIPS Use as instructed   glucose monitoring kit monitoring kit 1 each by Does not apply route as needed for other. CHeck blood glucose once daily before insulin injections. For ICD 10 E11.65   hydrOXYzine 25 MG tablet Commonly known as:  ATARAX/VISTARIL Take 1 tablet (25 mg total) by mouth daily as needed for itching. For itching   ibuprofen 200 MG tablet Commonly known as:  ADVIL,MOTRIN Take 600-800 mg by mouth every 6 (six) hours as needed for moderate pain.   Insulin Glargine 300 UNIT/ML Sopn Commonly known as:  TOUJEO SOLOSTAR Inject 52 Units into the skin daily. Adjust dose as advised What changed:    how much to take  additional instructions   Insulin Pen Needle 31G X 5 MM Misc 1 each by Does not apply route daily. Use to inject Toujeo daily. Dx:E11.9   Medical Compression Stockings Misc 1 each by Does not apply route daily.   omeprazole 20 MG capsule Commonly known as:  PRILOSEC Take 1 capsule (20 mg total) by mouth daily.   oxyCODONE-acetaminophen 5-325 MG tablet Commonly known as:  PERCOCET/ROXICET Take 1 tablet by mouth every 6 (six) hours as needed for moderate pain or severe pain.   quinapril 20 MG tablet Commonly known as:   ACCUPRIL Take 1 tablet (20 mg total) by mouth at bedtime.   sertraline 50 MG tablet Commonly known as:  ZOLOFT Take 1 tablet (50 mg total) by mouth daily.   simvastatin 40 MG tablet Commonly known as:  ZOCOR Take 1 tablet (40 mg total) by mouth at bedtime. Reported on 05/16/2015   sulfamethoxazole-trimethoprim 800-160 MG tablet Commonly known as:  BACTRIM DS,SEPTRA DS Take 1 tablet by mouth 2 (two) times daily for 10 days.       Vitals:   09/07/17 0746 09/07/17 1223  BP: 122/70 114/69  Pulse: 61 65  Resp:    Temp: (!) 97.5 F (36.4 C) 98 F (36.7 C)  SpO2: 96% 99%    Skin clean, dry and intact without evidence of skin break down, no evidence of skin tears noted. IV catheter discontinued intact. Site without signs and symptoms of complications. Dressing and pressure applied. Pt denies pain at this time. No complaints noted. Patient sent with wedge shoe on right foot.  An After Visit Summary was printed and given to the patient. Patient escorted via [place], and D/C home via private auto.  [place]

## 2017-09-07 NOTE — Progress Notes (Signed)
Dressing to right foot change per dr. Cleda Mccreedy

## 2017-09-08 ENCOUNTER — Other Ambulatory Visit: Payer: Self-pay | Admitting: Family Medicine

## 2017-09-08 ENCOUNTER — Telehealth: Payer: Self-pay | Admitting: Family Medicine

## 2017-09-08 ENCOUNTER — Telehealth: Payer: Self-pay

## 2017-09-08 LAB — AEROBIC/ANAEROBIC CULTURE W GRAM STAIN (SURGICAL/DEEP WOUND)

## 2017-09-08 LAB — AEROBIC/ANAEROBIC CULTURE (SURGICAL/DEEP WOUND)

## 2017-09-08 MED ORDER — HYDROXYZINE HCL 25 MG PO TABS: 25.0000 mg | ORAL_TABLET | Freq: Every day | ORAL | 1 refills | Status: DC | PRN

## 2017-09-08 NOTE — Telephone Encounter (Signed)
Pt. daughter called states that she need a walker was discharged from hospital without one. Pt daughter call back # is  [phone]- [phone]

## 2017-09-08 NOTE — Telephone Encounter (Signed)
We need to know what medical equipment supplier patient uses. They should have discharged her with home health company, often they will request this equipment and we can sign off on the order.  If she does not have a home health agency then we can write a rx and fax it to a medical supply store - she may contact one of these locations to see if they can help her, and we can send them an order - she needs to let us know where to send it.  [address], [place] [address] Open until [place] Phone: [phone]  [facility]. [address] [place], [place] [address] Ph: [phone] Fax: [place] 09/08/2017, 1:17 PM

## 2017-09-08 NOTE — Telephone Encounter (Signed)
Pt needs a refill on hydroxyzine sent to [facility].  Her call back number is [phone]

## 2017-09-08 NOTE — Telephone Encounter (Signed)
Refilled Hydroxyzine, as requested by patient and pharmacy. Note should be hydroxyzine below not Hydralazine  Nobie [place], DO [facility] 09/08/2017, 5:03 PM

## 2017-09-08 NOTE — Telephone Encounter (Signed)
Transition Care Management Follow-up Telephone Call  How have you been since you were released from the hospital? "doing okay"  Do you understand why you were in the hospital? yes  Do you have a copy of your discharge instructions Yes Do you understand the discharge instrcutions? yes  Where were you discharged to? Home  Do you have support at home? Yes    Items Reviewed:  Medications obtained no, picking up today   Medications reviewed: Yes  Dietary changes reviewed: yes  Home Health? N/A  DME ordered at discharge obtained? NA,   Medical supplies: NA,     Functional Questionnaire:   Activities of Daily Living (ADLs):   She states they are independent in the following: feeding, continence and toileting States they require assistance with the following: ambulation, bathing and hygiene and dressing  Any transportation issues/concerns?: no  Any patient concerns? Request refill on hydralazine  Confirmed importance and date/time of follow-up visits scheduled with PCP: yes, 09/15/2017 at 11:00am with Dr.Karamalegos  Confirm appointment scheduled with specialist? Yes, has appt with podiatrist on Wednesday   Confirmed with patient if condition begins to worsen call PCP or If it's emergency go to the ER.

## 2017-09-09 LAB — SURGICAL PATHOLOGY

## 2017-09-09 NOTE — Telephone Encounter (Signed)
Able to speak with patient she will call us back with more information for medical supply store.

## 2017-09-09 NOTE — Telephone Encounter (Signed)
Called patient's daughter a little kid kept on picking and disconnecting phone for the number that is provided # [phone].

## 2017-09-10 ENCOUNTER — Telehealth: Payer: Self-pay

## 2017-09-10 NOTE — Telephone Encounter (Signed)
EMMI Follow-up: Noted on the report the patient hadn't read discharge paperwork and said no follow-up appointment made.  I talked with Alyssa Perkins and she said her daughter read the discharge paperwork and have gotten appointments made.  I explained there would be a second automated call and to let us know if she had any needs that time.  No needs noted for today.

## 2017-09-15 ENCOUNTER — Inpatient Hospital Stay: Payer: Medicare Other | Admitting: Family Medicine

## 2017-10-13 DIAGNOSIS — E119 Type 2 diabetes mellitus without complications: Secondary | ICD-10-CM | POA: Diagnosis not present

## 2017-10-13 DIAGNOSIS — B351 Tinea unguium: Secondary | ICD-10-CM | POA: Diagnosis not present

## 2017-10-15 ENCOUNTER — Encounter: Payer: Self-pay | Admitting: Family Medicine

## 2017-10-15 ENCOUNTER — Ambulatory Visit (INDEPENDENT_AMBULATORY_CARE_PROVIDER_SITE_OTHER): Payer: Medicare Other | Admitting: Family Medicine

## 2017-10-15 VITALS — BP 127/59 | HR 81 | Temp 98.6°F | Resp 16 | Ht 63.0 in | Wt 219.6 lb

## 2017-10-15 DIAGNOSIS — G44221 Chronic tension-type headache, intractable: Secondary | ICD-10-CM | POA: Diagnosis not present

## 2017-10-15 DIAGNOSIS — K219 Gastro-esophageal reflux disease without esophagitis: Secondary | ICD-10-CM

## 2017-10-15 DIAGNOSIS — G8929 Other chronic pain: Secondary | ICD-10-CM

## 2017-10-15 DIAGNOSIS — M542 Cervicalgia: Secondary | ICD-10-CM

## 2017-10-15 DIAGNOSIS — H8111 Benign paroxysmal vertigo, right ear: Secondary | ICD-10-CM | POA: Diagnosis not present

## 2017-10-15 MED ORDER — TRAMADOL HCL 50 MG PO TABS: 50.0000 mg | ORAL_TABLET | Freq: Every evening | ORAL | 2 refills | Status: DC | PRN

## 2017-10-15 MED ORDER — OMEPRAZOLE 20 MG PO CPDR
20.0000 mg | DELAYED_RELEASE_CAPSULE | Freq: Every day | ORAL | 3 refills | Status: DC
Start: 2017-10-15 — End: 2018-03-13

## 2017-10-15 NOTE — Patient Instructions (Addendum)
Thank you for coming to the office today.  Refilled Tramadol to [facility]  Notify if need other rx refilled  1. You have symptoms of Vertigo (Benign Paroxysmal Positional Vertigo) - This is commonly caused by inner ear fluid imbalance, sometimes can be worsened by allergies and sinus symptoms, otherwise it can occur randomly sometimes and we may never discover the exact cause. - To treat this, try the Epley Manuever (see diagrams/instructions below) at home up to 3 times a day for 1-2 weeks or until symptoms resolve - You may take Meclizine as needed up to 3 times a day for dizziness, this will not cure symptoms but may help. Caution may make you drowsy. - IF YOU TAKE MECLIZINE - then stop hydroxyzine  If you develop significant worsening episode with vertigo that does not improve and you get severe headache, loss of vision, arm or leg weakness, slurred speech, or other concerning symptoms please seek immediate medical attention at Emergency Department.  Please schedule a follow-up appointment with Dr Parks Ranger within 4 weeks if Vertigo not improving, and will consider Referral to Vestibular Rehab  See the next page for images describing the Epley Manuever.     ----------------------------------------------------------------------------------------------------------------------          Please schedule a Follow-up Appointment to: Return in about 1 month (around 11/12/2017), or if symptoms worsen or fail to improve, for vertigo.  If you have any other questions or concerns, please feel free to call the office or send a message through [place]. You may also schedule an earlier appointment if necessary.  Additionally, you may be receiving a survey about your experience at our office within a few days to 1 week by e-mail or mail. We value your feedback.  Nobie [place], DO [place]

## 2017-10-15 NOTE — Progress Notes (Signed)
Subjective:    Patient ID: Alyssa Perkins, female    DOB: [birth year], 67 y.o.   MRN: [id number]  Alyssa Perkins is a 67 y.o. female presenting on 10/15/2017 for Gait Problem (dizzyness, onset 3  weeks ago toe amputation and onset week and half ago loosing gait, out of balance and dizziness)  Patient presents for a same day appointment.  HPI   Dizziness / Vertigo / S/p R 2nd Toe Amputation / Headaches - Recent history s/p R toe amputation in 08/2017, has done well post op. She has developed recently about a 1.5 weeks ago some vertigo symptoms by her report of feeling "uneven" she feels "stuff moving" and "room spinning", seems to be episodes, noticed it more to R side. Also admits some sinus pressure in general, pressure behind eyes, she has seen her eye doctor and was told eyes are healthy. She has not been checking BP regularly. Today is normal reading. Does not happen only when standing up - Admits itching chronically, takes hydroxyzine for years - has not caused dizziness - Takes Tramadol for headaches, needs refill  - Denies focal numbness weakness or tingling, nausea vomiting, chest pain or dyspnea, vision loss   Depression screen [facility] 2/9 10/15/2017 02/21/2017 02/11/2017  Decreased Interest 2 3 1   Down, Depressed, Hopeless 1 2 1   PHQ - 2 Score 3 5 2   Altered sleeping 3 3 0  Tired, decreased energy 2 3 1   Change in appetite 1 2 0  Feeling bad or failure about yourself  1 2 0  Trouble concentrating 3 2 0  Moving slowly or fidgety/restless 0 2 0  Suicidal thoughts 0 0 0  PHQ-9 Score 13 19 3   Difficult doing work/chores Somewhat difficult Not difficult at all Not difficult at all  Some recent data might be hidden   GAD 7 : Generalized Anxiety Score 10/15/2017 01/15/2017  Nervous, Anxious, on Edge 2 2  Control/stop worrying 2 3  Worry too much - different things 3 3  Trouble relaxing 3 2  Restless 2 2  Easily annoyed or irritable 2 3  Afraid - awful might happen 1 2    Total GAD 7 Score 15 17  Anxiety Difficulty Somewhat difficult Somewhat difficult     Social History   Tobacco Use  . Smoking status: Former Smoker    Packs/day: 0.50    Years: 15.00    Pack years: 7.50    Types: Cigarettes    Last attempt to quit: 07/17/1994    Years since quitting: 23.2  . Smokeless tobacco: Never Used  . Tobacco comment: stopped 18 years ago  Substance Use Topics  . Alcohol use: No  . Drug use: No    Review of Systems Per HPI unless specifically indicated above     Objective:    BP (!) 127/59   Pulse 81   Temp 98.6 F (37 C) (Oral)   Resp 16   Ht 5\' 3"  (1.6 m)   Wt 219 lb 9.6 oz (99.6 kg)   BMI 38.90 kg/m   Wt Readings from Last 3 Encounters:  10/15/17 219 lb 9.6 oz (99.6 kg)  09/02/17 214 lb (97.1 kg)  05/09/17 224 lb 9.6 oz (101.9 kg)    Physical Exam  Constitutional: She is oriented to person, place, and time. She appears well-developed and well-nourished. No distress.  Well-appearing, comfortable, cooperative, obese  HENT:  Head: Normocephalic and atraumatic.  Mouth/Throat: Oropharynx is clear and moist.  Frontal / maxillary  sinuses non-tender. Nares patent without purulence or edema. Bilateral TMs clear without erythema, effusion or bulging. Oropharynx clear without erythema, exudates, edema or asymmetry.  Eyes: Conjunctivae are normal. Right eye exhibits no discharge. Left eye exhibits no discharge.  Neck: Normal range of motion. Neck supple. No thyromegaly present.  Cardiovascular: Normal rate, regular rhythm, normal heart sounds and intact distal pulses.  No murmur heard. Pulmonary/Chest: Effort normal and breath sounds normal. No respiratory distress. She has no wheezes. She has no rales.  Musculoskeletal: Normal range of motion. She exhibits no edema.  S/p R 2nd toe amputation, well healed  Without cane or walker. Able to stand and ambulate without difficulty  Lymphadenopathy:    She has no cervical adenopathy.  Neurological:  She is alert and oriented to person, place, and time.  Skin: Skin is warm and dry. No rash noted. She is not diaphoretic. No erythema.  Psychiatric: She has a normal mood and affect. Her behavior is normal.  Well groomed, good eye contact, normal speech and thoughts  Nursing note and vitals reviewed.      Assessment & Plan:   Problem List Items Addressed This Visit    Acid reflux Refilled   Relevant Medications   omeprazole (PRILOSEC) 20 MG capsule   BPPV (benign paroxysmal positional vertigo), right - Primary  Suspected R-BPPV by history - No other significant neurological findings or focal deficits  Plan: 1. Handout given with Epley maneuver TID for 1-2 weeks until resolved 2. May try holding hydroxyzine and try meclizine PRN for breakthrough symptoms 3. Return criteria, if not improved consider vestibular PT referral  She declined offer of [place] therapy at this time. Post-op from toe amputation     Chronic headaches Clinically consistent with prior known headaches Refill Tramadol   Relevant Medications   traZODone (DESYREL) 50 MG tablet   traMADol (ULTRAM) 50 MG tablet   Chronic neck pain Refill Tramadol   Relevant Medications   traZODone (DESYREL) 50 MG tablet   traMADol (ULTRAM) 50 MG tablet      Meds ordered this encounter  Medications  . traMADol (ULTRAM) 50 MG tablet    Sig: Take 1 tablet (50 mg total) by mouth at bedtime as needed. for pain    Dispense:  30 tablet    Refill:  2  . omeprazole (PRILOSEC) 20 MG capsule    Sig: Take 1 capsule (20 mg total) by mouth daily.    Dispense:  90 capsule    Refill:  3    Follow up plan: Return in about 1 month (around 11/12/2017), or if symptoms worsen or fail to improve, for vertigo.  Nobie [place], [facility] 10/15/2017, 10:13 PM

## 2017-11-12 ENCOUNTER — Ambulatory Visit: Payer: Medicare Other | Admitting: Family Medicine

## 2017-11-13 ENCOUNTER — Ambulatory Visit (INDEPENDENT_AMBULATORY_CARE_PROVIDER_SITE_OTHER): Payer: Medicare Other | Admitting: Family Medicine

## 2017-11-13 ENCOUNTER — Encounter: Payer: Self-pay | Admitting: Family Medicine

## 2017-11-13 VITALS — BP 149/65 | HR 86 | Temp 98.5°F | Resp 16 | Ht 63.0 in | Wt 215.0 lb

## 2017-11-13 DIAGNOSIS — R21 Rash and other nonspecific skin eruption: Secondary | ICD-10-CM | POA: Diagnosis not present

## 2017-11-13 DIAGNOSIS — L739 Follicular disorder, unspecified: Secondary | ICD-10-CM | POA: Diagnosis not present

## 2017-11-13 DIAGNOSIS — M25531 Pain in right wrist: Secondary | ICD-10-CM | POA: Diagnosis not present

## 2017-11-13 DIAGNOSIS — G8929 Other chronic pain: Secondary | ICD-10-CM | POA: Diagnosis not present

## 2017-11-13 DIAGNOSIS — L209 Atopic dermatitis, unspecified: Secondary | ICD-10-CM | POA: Diagnosis not present

## 2017-11-13 DIAGNOSIS — M150 Primary generalized (osteo)arthritis: Secondary | ICD-10-CM

## 2017-11-13 DIAGNOSIS — M159 Polyosteoarthritis, unspecified: Secondary | ICD-10-CM

## 2017-11-13 MED ORDER — TRIAMCINOLONE ACETONIDE 0.5 % EX CREA
1.0000 | TOPICAL_CREAM | Freq: Two times a day (BID) | CUTANEOUS | 0 refills | Status: DC
Start: 2017-11-13 — End: 2018-11-05

## 2017-11-13 MED ORDER — MUPIROCIN 2 % EX OINT: 1.0000 "application " | TOPICAL_OINTMENT | Freq: Two times a day (BID) | CUTANEOUS | 0 refills | Status: DC

## 2017-11-13 MED ORDER — DICLOFENAC SODIUM 1 % TD GEL: 2.0000 g | Freq: Three times a day (TID) | TRANSDERMAL | 3 refills | Status: DC | PRN

## 2017-11-13 NOTE — Progress Notes (Signed)
Subjective:    Patient ID: Alyssa Perkins, female    DOB: 1950/11/08, 67 y.o.   MRN: [id number]  Alyssa Perkins is a 67 y.o. female presenting on 11/13/2017 for spot on leg (Right foot)   HPI   Follow-up s/p R lower extremity, 2nd toe amp s/p Reviewed recent course with hospitalization back in 08/2017 for cellulitis and gangrene of R foot 2nd toe, ultimately required amputation, followed by Dr Shela Nevin [facility] Podiatry and had vascular consult. She was treated for MRSA at that time. - Admits some pain with ambulation at site of surgery, seems to have difficulty with poison of 2nd toe stump impacting 3rd toe. She is next scheduled to return to podiatry in 2 months, end of October - Denies problem with the incision or wound that has healed, swelling redness or rash at this site of foot  Rash, Right Lower Extremity - Now new problem with a rash on her Right lower extremity onset 1 week, described initial smaller area with more redness and then it seemed to get larger and lightened without as much redness, had a few "bumps" in this area, stated skin was dry and flaky, she had no pain only itchy and she was scratching this area. - Tried using some topical treatments, neosporin and other with mild relief - Admits some tingling numbness neuropathy symptoms - Denies any fever or chills, nausea vomiting, skin pain or drainage or ulceration  Left Middle Finger, Trigger Report additional complaint with episodes of left middle finger locking and getting stick, at middle of the finger joint. With some pain to get it back into position. SHe has seen [facility] orthopedics in past, not ready to return now.  Right Wrist, pain, chronic Last visit 04/2017, seen for fall and had pain with R wrist. Had negative X-ray Tried in past with wrist splint and support and conservative care Now >6 months later she still has problem with pain at this site. No significant swelling or deformity. Admits some burning and  tingling  Denies new injury  Depression screen [facility] 2/9 10/15/2017 02/21/2017 02/11/2017  Decreased Interest 2 3 1   Down, Depressed, Hopeless 1 2 1   PHQ - 2 Score 3 5 2   Altered sleeping 3 3 0  Tired, decreased energy 2 3 1   Change in appetite 1 2 0  Feeling bad or failure about yourself  1 2 0  Trouble concentrating 3 2 0  Moving slowly or fidgety/restless 0 2 0  Suicidal thoughts 0 0 0  PHQ-9 Score 13 19 3   Difficult doing work/chores Somewhat difficult Not difficult at all Not difficult at all  Some recent data might be hidden    Social History   Tobacco Use  . Smoking status: Former Smoker    Packs/day: 0.50    Years: 15.00    Pack years: 7.50    Types: Cigarettes    Last attempt to quit: 07/17/1994    Years since quitting: 23.3  . Smokeless tobacco: Former Systems developer  . Tobacco comment: stopped 18 years ago  Substance Use Topics  . Alcohol use: No  . Drug use: No    Review of Systems Per HPI unless specifically indicated above     Objective:    BP (!) 149/65   Pulse 86   Temp 98.5 F (36.9 C) (Oral)   Resp 16   Ht 5\' 3"  (1.6 m)   Wt 215 lb (97.5 kg)   BMI 38.09 kg/m   Wt Readings from  Last 3 Encounters:  11/13/17 215 lb (97.5 kg)  10/15/17 219 lb 9.6 oz (99.6 kg)  09/02/17 214 lb (97.1 kg)    Physical Exam  Constitutional: She is oriented to person, place, and time. She appears well-developed and well-nourished. No distress.  Well-appearing, comfortable, cooperative, obese  HENT:  Head: Normocephalic and atraumatic.  Mouth/Throat: Oropharynx is clear and moist.  Eyes: Conjunctivae are normal. Right eye exhibits no discharge. Left eye exhibits no discharge.  Cardiovascular: Normal rate.  Pulmonary/Chest: Effort normal.  Musculoskeletal: She exhibits no edema.  Right Foot S/p 2nd toe amputation with well healed incision, has residual tissue with some firmness. It is soft without induration erythema or fluctuance. No open ulceration or other problem. It  appears anatomy seems to be underlying 3rd toe.  Right Wrist Bony tenderness over ulnar styloid without obvious deformity erythema, edema, or ecchymosis, otherwise no obvious deformity, some pain with wrist extension.  L middle finger without evidence of obvious triggering today  Neurological: She is alert and oriented to person, place, and time.  Skin: Skin is warm and dry. Rash (2 to 2.5 cm region of dry slightly erythematous skin patch without ulceration, induration or edema or drainage, non tender, without warmth) noted. She is not diaphoretic. No erythema.  Psychiatric: She has a normal mood and affect. Her behavior is normal.  Well groomed, good eye contact, normal speech and thoughts  Nursing note and vitals reviewed.    Right lower extremity medial   Right Foot     Results for orders placed or performed during the hospital encounter of 09/02/17  Blood Culture (routine x 2)  Result Value Ref Range   Specimen Description BLOOD RIGHT ANTECUBITAL    Special Requests      BOTTLES DRAWN AEROBIC AND ANAEROBIC Blood Culture adequate volume   Culture      NO GROWTH 5 DAYS Performed at [facility], [address] [place], [place] [address]    Report Status 09/07/2017 FINAL   Blood Culture (routine x 2)  Result Value Ref Range   Specimen Description BLOOD BLOOD RIGHT FOREARM    Special Requests      BOTTLES DRAWN AEROBIC AND ANAEROBIC Blood Culture adequate volume   Culture      NO GROWTH 5 DAYS Performed at [facility], [address], [place], [place] [address]    Report Status 09/07/2017 FINAL   Aerobic/Anaerobic Culture (surgical/deep wound)  Result Value Ref Range   Specimen Description      TISSUE Performed at [facility], [address] [place], [place] [address]    Special Requests RECEIVED A SWAB    Gram Stain      RARE WBC PRESENT, PREDOMINANTLY PMN MODERATE GRAM POSITIVE COCCI    Culture      ABUNDANT METHICILLIN  RESISTANT STAPHYLOCOCCUS AUREUS NO ANAEROBES ISOLATED Performed at [facility], [place] [address], [place], [place] [address]    Report Status 09/08/2017 FINAL    Organism ID, Bacteria METHICILLIN RESISTANT STAPHYLOCOCCUS AUREUS       Susceptibility   Methicillin resistant staphylococcus aureus - MIC*    CIPROFLOXACIN <=0.5 SENSITIVE Sensitive     ERYTHROMYCIN >=8 RESISTANT Resistant     GENTAMICIN <=0.5 SENSITIVE Sensitive     OXACILLIN >=4 RESISTANT Resistant     TETRACYCLINE <=1 SENSITIVE Sensitive     VANCOMYCIN 1 SENSITIVE Sensitive     TRIMETH/SULFA <=10 SENSITIVE Sensitive     CLINDAMYCIN <=0.25 SENSITIVE Sensitive  RIFAMPIN <=0.5 SENSITIVE Sensitive     Inducible Clindamycin NEGATIVE Sensitive     * ABUNDANT METHICILLIN RESISTANT STAPHYLOCOCCUS AUREUS  Comprehensive metabolic panel  Result Value Ref Range   Sodium 138 135 - 145 mmol/L   Potassium 4.2 3.5 - 5.1 mmol/L   Chloride 103 101 - 111 mmol/L   CO2 25 22 - 32 mmol/L   Glucose, Bld 248 (H) 65 - 99 mg/dL   BUN 11 6 - 20 mg/dL   Creatinine, Ser 0.94 0.44 - 1.00 mg/dL   Calcium 8.9 8.9 - 10.3 mg/dL   Total Protein 7.9 6.5 - 8.1 g/dL   Albumin 3.9 3.5 - 5.0 g/dL   AST 24 15 - 41 U/L   ALT 16 14 - 54 U/L   Alkaline Phosphatase 126 38 - 126 U/L   Total Bilirubin 1.0 0.3 - 1.2 mg/dL   GFR calc non Af Amer >60 >60 mL/min   GFR calc Af Amer >60 >60 mL/min   Anion gap 10 5 - 15  CBC with Differential  Result Value Ref Range   WBC 17.5 (H) 3.6 - 11.0 K/uL   RBC 5.47 (H) 3.80 - 5.20 MIL/uL   Hemoglobin 14.8 12.0 - 16.0 g/dL   HCT 44.5 35.0 - 47.0 %   MCV 81.5 80.0 - 100.0 fL   MCH 27.1 26.0 - 34.0 pg   MCHC 33.3 32.0 - 36.0 g/dL   RDW 14.7 (H) 11.5 - 14.5 %   Platelets 280 150 - 440 K/uL   Neutrophils Relative % 77 %   Neutro Abs 13.6 (H) 1.4 - 6.5 K/uL   Lymphocytes Relative 15 %   Lymphs Abs 2.6 1.0 - 3.6 K/uL   Monocytes Relative 5 %   Monocytes Absolute 0.8 0.2 - 0.9 K/uL   Eosinophils Relative 2 %     Eosinophils Absolute 0.3 0 - 0.7 K/uL   Basophils Relative 1 %   Basophils Absolute 0.1 0 - 0.1 K/uL  Hemoglobin A1c  Result Value Ref Range   Hgb A1c MFr Bld 10.9 (H) 4.8 - 5.6 %   Mean Plasma Glucose 266.13 mg/dL  Glucose, capillary  Result Value Ref Range   Glucose-Capillary 196 (H) 65 - 99 mg/dL  Glucose, capillary  Result Value Ref Range   Glucose-Capillary 261 (H) 65 - 99 mg/dL  Glucose, capillary  Result Value Ref Range   Glucose-Capillary 199 (H) 65 - 99 mg/dL   Comment 1 Notify RN   Glucose, capillary  Result Value Ref Range   Glucose-Capillary 288 (H) 65 - 99 mg/dL   Comment 1 Notify RN   CBC  Result Value Ref Range   WBC 10.8 3.6 - 11.0 K/uL   RBC 4.82 3.80 - 5.20 MIL/uL   Hemoglobin 13.4 12.0 - 16.0 g/dL   HCT 39.7 35.0 - 47.0 %   MCV 82.3 80.0 - 100.0 fL   MCH 27.7 26.0 - 34.0 pg   MCHC 33.7 32.0 - 36.0 g/dL   RDW 14.9 (H) 11.5 - 14.5 %   Platelets 251 150 - 440 K/uL  Basic metabolic panel  Result Value Ref Range   Sodium 140 135 - 145 mmol/L   Potassium 3.7 3.5 - 5.1 mmol/L   Chloride 103 101 - 111 mmol/L   CO2 28 22 - 32 mmol/L   Glucose, Bld 99 65 - 99 mg/dL   BUN 16 6 - 20 mg/dL   Creatinine, Ser 1.03 (H) 0.44 - 1.00 mg/dL   Calcium 8.3 (  L) 8.9 - 10.3 mg/dL   GFR calc non Af Amer 55 (L) >60 mL/min   GFR calc Af Amer >60 >60 mL/min   Anion gap 9 5 - 15  Glucose, capillary  Result Value Ref Range   Glucose-Capillary 175 (H) 65 - 99 mg/dL   Comment 1 Notify RN   Glucose, capillary  Result Value Ref Range   Glucose-Capillary 120 (H) 65 - 99 mg/dL  Glucose, capillary  Result Value Ref Range   Glucose-Capillary 116 (H) 65 - 99 mg/dL  Glucose, capillary  Result Value Ref Range   Glucose-Capillary 181 (H) 65 - 99 mg/dL  Urinalysis, Routine w reflex microscopic  Result Value Ref Range   Color, Urine STRAW (A) YELLOW   APPearance CLEAR (A) CLEAR   Specific Gravity, Urine 1.004 (L) 1.005 - 1.030   pH 6.0 5.0 - 8.0   Glucose, UA NEGATIVE  NEGATIVE mg/dL   Hgb urine dipstick NEGATIVE NEGATIVE   Bilirubin Urine NEGATIVE NEGATIVE   Ketones, ur NEGATIVE NEGATIVE mg/dL   Protein, ur NEGATIVE NEGATIVE mg/dL   Nitrite NEGATIVE NEGATIVE   Leukocytes, UA NEGATIVE NEGATIVE  Basic metabolic panel  Result Value Ref Range   Sodium 138 135 - 145 mmol/L   Potassium 3.6 3.5 - 5.1 mmol/L   Chloride 103 101 - 111 mmol/L   CO2 26 22 - 32 mmol/L   Glucose, Bld 313 (H) 65 - 99 mg/dL   BUN 14 6 - 20 mg/dL   Creatinine, Ser 0.98 0.44 - 1.00 mg/dL   Calcium 8.0 (L) 8.9 - 10.3 mg/dL   GFR calc non Af Amer 59 (L) >60 mL/min   GFR calc Af Amer >60 >60 mL/min   Anion gap 9 5 - 15  Glucose, capillary  Result Value Ref Range   Glucose-Capillary 145 (H) 65 - 99 mg/dL  Glucose, capillary  Result Value Ref Range   Glucose-Capillary 167 (H) 65 - 99 mg/dL  Glucose, capillary  Result Value Ref Range   Glucose-Capillary 225 (H) 65 - 99 mg/dL  Glucose, capillary  Result Value Ref Range   Glucose-Capillary 240 (H) 65 - 99 mg/dL  Glucose, capillary  Result Value Ref Range   Glucose-Capillary 104 (H) 65 - 99 mg/dL  Basic metabolic panel  Result Value Ref Range   Sodium 136 135 - 145 mmol/L   Potassium 4.7 3.5 - 5.1 mmol/L   Chloride 103 101 - 111 mmol/L   CO2 24 22 - 32 mmol/L   Glucose, Bld 348 (H) 65 - 99 mg/dL   BUN 15 6 - 20 mg/dL   Creatinine, Ser 0.88 0.44 - 1.00 mg/dL   Calcium 8.3 (L) 8.9 - 10.3 mg/dL   GFR calc non Af Amer >60 >60 mL/min   GFR calc Af Amer >60 >60 mL/min   Anion gap 9 5 - 15  Glucose, capillary  Result Value Ref Range   Glucose-Capillary 120 (H) 65 - 99 mg/dL   Comment 1 Notify RN   Glucose, capillary  Result Value Ref Range   Glucose-Capillary 154 (H) 65 - 99 mg/dL  Glucose, capillary  Result Value Ref Range   Glucose-Capillary 293 (H) 65 - 99 mg/dL   Comment 1 Notify RN   Glucose, capillary  Result Value Ref Range   Glucose-Capillary 367 (H) 65 - 99 mg/dL  Glucose, capillary  Result Value Ref Range    Glucose-Capillary 234 (H) 65 - 99 mg/dL   Comment 1 Notify RN   CBC with Differential/Platelet  Result Value Ref Range   WBC 11.2 (H) 3.6 - 11.0 K/uL   RBC 4.44 3.80 - 5.20 MIL/uL   Hemoglobin 12.4 12.0 - 16.0 g/dL   HCT 36.5 35.0 - 47.0 %   MCV 82.2 80.0 - 100.0 fL   MCH 27.9 26.0 - 34.0 pg   MCHC 33.9 32.0 - 36.0 g/dL   RDW 14.6 (H) 11.5 - 14.5 %   Platelets 237 150 - 440 K/uL   Neutrophils Relative % 65 %   Neutro Abs 7.3 (H) 1.4 - 6.5 K/uL   Lymphocytes Relative 28 %   Lymphs Abs 3.1 1.0 - 3.6 K/uL   Monocytes Relative 4 %   Monocytes Absolute 0.5 0.2 - 0.9 K/uL   Eosinophils Relative 2 %   Eosinophils Absolute 0.2 0 - 0.7 K/uL   Basophils Relative 1 %   Basophils Absolute 0.1 0 - 0.1 K/uL  Vancomycin, trough  Result Value Ref Range   Vancomycin Tr 9 (L) 15 - 20 ug/mL  Glucose, capillary  Result Value Ref Range   Glucose-Capillary 215 (H) 65 - 99 mg/dL   Comment 1 Notify RN   Glucose, capillary  Result Value Ref Range   Glucose-Capillary 238 (H) 65 - 99 mg/dL  Glucose, capillary  Result Value Ref Range   Glucose-Capillary 194 (H) 65 - 99 mg/dL   Comment 1 Notify RN   Glucose, capillary  Result Value Ref Range   Glucose-Capillary 195 (H) 65 - 99 mg/dL  Surgical pathology  Result Value Ref Range   SURGICAL PATHOLOGY      Surgical Pathology CASE: [id number] PATIENT: Shelda Jakes Surgical Pathology Report     SPECIMEN SUBMITTED: A. Second toe, right  CLINICAL HISTORY: None provided  PRE-OPERATIVE DIAGNOSIS: Cellulitis with abscess and necrosis of right forefoot and second toe  POST-OPERATIVE DIAGNOSIS: Same as pre op     DIAGNOSIS: A.  RIGHT SECOND TOE; AMPUTATION: - ULCERATION AND FOCAL NECROSIS. - INFLAMMATION AND ULCERATION IS PRESENT AT THE SOFT TISSUE MARGIN. - THE BONE MARGIN IS UNREMARKABLE.   GROSS DESCRIPTION: A. Labeled: Right second toe Received: In formalin Size: 5.4 x 2.1 x 1.9 cm Description of lesion(s): 5.3 x 2.5 cm  ulcer Proximal margin: Ulcer grossly abutting the margin, margin inked green Bone: Bone at margin is a smooth articular surface Other findings: No additional noted  Block summary: 1 - perpendicular skin margin 2 - en face bone at margin 3 - representative bone underlying ulcer following decalcification  Tissue decalcification : Cassettes 2 and 3   Final Diagnosis performed by Delorse Lek, MD.   Electronically signed 09/09/2017 3:20:33PM The electronic signature indicates that the named Attending Pathologist has evaluated the specimen  Technical component performed at [facility], [address] [place], [place] [address] Lab: [phone] Dir: Rush Farmer, MD, MMM  Professional component performed at [facility], [facility], [place], [place], [place] [address] Lab: [phone] Dir: Dellia Nims. Rubinas, MD       Assessment & Plan:   Problem List Items Addressed This Visit    None    Visit Diagnoses    Atopic dermatitis, unspecified type    -  Primary   Relevant Medications   triamcinolone cream (KENALOG) 0.5 %   Rash and nonspecific skin eruption       Folliculitis      R lower extremity rash, see picture, does not seem consistent with cellulitis or focal abscess, seems to be more contact type dermatitis / atopic, with itching  and dry skin. Possible areas within this spot due to scratching may be early folliculitis  Plan Treat with topical regimen, not meet criteria for oral antibiotics or other treatment Start with topical steroid triamcinolone 0.5% cream use first then 30 min later after absorb use topical mupirocin antibiotic - BID for 1-2 weeks or until resolved Follow-up - strict return criteria given if worsening or new problem    Relevant Medications   mupirocin ointment (BACTROBAN) 2 %   Chronic pain of right wrist     Clinically no obvious deformity or etiology of pain, suspect some of her described symptoms sound more like peripheral neuropathy  with burning and neuropathic pain - Prior X-ray negative >6 months ago  Plan Rx trial diclofenac topical, she has used before with relief Future may need other treatment for neuropathy    Relevant Medications   diclofenac sodium (VOLTAREN) 1 % GEL   Primary osteoarthritis involving multiple joints       Relevant Medications   diclofenac sodium (VOLTAREN) 1 % GEL      Meds ordered this encounter  Medications  . mupirocin ointment (BACTROBAN) 2 %    Sig: Apply 1 application topically 2 (two) times daily. For 1-2 weeks, use 15-30 min after topical steroid    Dispense:  30 g    Refill:  0  . triamcinolone cream (KENALOG) 0.5 %    Sig: Apply 1 application topically 2 (two) times daily. To affected area, for up to 2 weeks.    Dispense:  30 g    Refill:  0  . diclofenac sodium (VOLTAREN) 1 % GEL    Sig: Apply 2 g topically 3 (three) times daily as needed.    Dispense:  100 g    Refill:  3    Follow up plan: Return in about 4 weeks (around 12/11/2017), or if symptoms worsen or fail to improve.  Nobie Putnam, [facility] 11/13/2017, 1:08 PM

## 2017-11-13 NOTE — Patient Instructions (Addendum)
Thank you for coming to the office today.  For Right leg rash Does not look like MRSA May be superficial dermatitis or irritated skin, can be worse from scratching. Possibly a folliculitis or superficial infection of the hair follicles does not seem to be a spreading skin infection. You do not need oral antibiotic Start topical treatments with Triamcinolone cream twice a day, use this first, rub into skin in this spot, after 30 min THEN use the Antibiotic Mupirocin ointment to apply on top of this spot - use about 1-2 weeks or until healed.  If not improving you may need to return for re-evaluation. But if more severe worsening such as spreading redness or streaking redness, significantly larger size, persistent drainage of pus, increased pain, fevers/chills, nausea vomiting and cannot take antibiotic. If significantly worse symptoms or most of these symptoms, would recommend going straight to Hospital Emergency Dept as you may require IV antibiotics instead.  For the toe amputation - Call Dr Cleda Mccreedy if you feel like you cannot wait until 10/29 for your next apt to have it looked at. I think it is a mechanical problem with the position of the amputated toe and the scar tissue.  For wrist - try diclofenac topical gel for wrist joint inflammation The burning is likely from nerve injury from sugar, limited options unfortunately Keep wearing wrist splint  ------- Call to schedule with  [facility]  [address]  [address] [place], [place] [address]  [phone]  Towanda Octave, MD  [address]  [id number]  [place], [place] [address]  [phone]       Please schedule a Follow-up Appointment to: Return in about 4 months (around 03/15/2018), or if symptoms worsen or fail to improve.  If you have any other questions or concerns, please feel free to call the office or send a message through [facility]. You may also  schedule an earlier appointment if necessary.  Additionally, you may be receiving a survey about your experience at our office within a few days to 1 week by e-mail or mail. We value your feedback.  Nobie Putnam, DO [facility]

## 2017-12-01 DIAGNOSIS — M79671 Pain in right foot: Secondary | ICD-10-CM | POA: Diagnosis not present

## 2017-12-18 ENCOUNTER — Telehealth: Payer: Self-pay | Admitting: Family Medicine

## 2017-12-18 NOTE — Telephone Encounter (Signed)
Pt's power bill has been running $400 to $500 dollars a month.  [employer] said if she gets a letter saying she is on sleep apnea machine they would work with her.  Please call [phone]

## 2017-12-19 ENCOUNTER — Encounter: Payer: Self-pay | Admitting: Family Medicine

## 2017-12-19 NOTE — Telephone Encounter (Signed)
Printed and signed letter. It is in my out-mail box. Please notify patient that it is ready for pick up.  Nobie Putnam, [facility] 12/19/2017, 5:50 PM

## 2017-12-22 NOTE — Telephone Encounter (Signed)
Up front ready for pick up and left message to notify patient.

## 2018-01-13 DIAGNOSIS — E119 Type 2 diabetes mellitus without complications: Secondary | ICD-10-CM | POA: Diagnosis not present

## 2018-01-13 DIAGNOSIS — B351 Tinea unguium: Secondary | ICD-10-CM | POA: Diagnosis not present

## 2018-01-21 ENCOUNTER — Encounter: Payer: Self-pay | Admitting: Emergency Medicine

## 2018-01-21 ENCOUNTER — Emergency Department
Admission: EM | Admit: 2018-01-21 | Discharge: 2018-01-21 | Disposition: A | Discharge status: Home / Self Care | Payer: Medicare Other | Attending: Emergency Medicine | Admitting: Emergency Medicine

## 2018-01-21 ENCOUNTER — Emergency Department: Payer: Medicare Other

## 2018-01-21 ENCOUNTER — Other Ambulatory Visit: Payer: Self-pay

## 2018-01-21 DIAGNOSIS — E119 Type 2 diabetes mellitus without complications: Secondary | ICD-10-CM | POA: Insufficient documentation

## 2018-01-21 DIAGNOSIS — S299XXA Unspecified injury of thorax, initial encounter: Secondary | ICD-10-CM | POA: Diagnosis not present

## 2018-01-21 DIAGNOSIS — M79631 Pain in right forearm: Secondary | ICD-10-CM | POA: Insufficient documentation

## 2018-01-21 DIAGNOSIS — S199XXA Unspecified injury of neck, initial encounter: Secondary | ICD-10-CM | POA: Diagnosis not present

## 2018-01-21 DIAGNOSIS — J45909 Unspecified asthma, uncomplicated: Secondary | ICD-10-CM | POA: Insufficient documentation

## 2018-01-21 DIAGNOSIS — Y939 Activity, unspecified: Secondary | ICD-10-CM | POA: Insufficient documentation

## 2018-01-21 DIAGNOSIS — W19XXXA Unspecified fall, initial encounter: Secondary | ICD-10-CM

## 2018-01-21 DIAGNOSIS — S0990XA Unspecified injury of head, initial encounter: Secondary | ICD-10-CM | POA: Diagnosis not present

## 2018-01-21 DIAGNOSIS — R0789 Other chest pain: Secondary | ICD-10-CM | POA: Diagnosis not present

## 2018-01-21 DIAGNOSIS — Z794 Long term (current) use of insulin: Secondary | ICD-10-CM | POA: Diagnosis not present

## 2018-01-21 DIAGNOSIS — Y999 Unspecified external cause status: Secondary | ICD-10-CM | POA: Insufficient documentation

## 2018-01-21 DIAGNOSIS — M545 Low back pain: Secondary | ICD-10-CM | POA: Diagnosis not present

## 2018-01-21 DIAGNOSIS — M25512 Pain in left shoulder: Secondary | ICD-10-CM | POA: Insufficient documentation

## 2018-01-21 DIAGNOSIS — W11XXXA Fall on and from ladder, initial encounter: Secondary | ICD-10-CM | POA: Diagnosis not present

## 2018-01-21 DIAGNOSIS — Z79899 Other long term (current) drug therapy: Secondary | ICD-10-CM | POA: Diagnosis not present

## 2018-01-21 DIAGNOSIS — Z87891 Personal history of nicotine dependence: Secondary | ICD-10-CM | POA: Diagnosis not present

## 2018-01-21 DIAGNOSIS — S59912A Unspecified injury of left forearm, initial encounter: Secondary | ICD-10-CM | POA: Diagnosis not present

## 2018-01-21 DIAGNOSIS — M25531 Pain in right wrist: Secondary | ICD-10-CM | POA: Diagnosis not present

## 2018-01-21 DIAGNOSIS — R51 Headache: Secondary | ICD-10-CM | POA: Diagnosis not present

## 2018-01-21 DIAGNOSIS — Y929 Unspecified place or not applicable: Secondary | ICD-10-CM | POA: Insufficient documentation

## 2018-01-21 DIAGNOSIS — M546 Pain in thoracic spine: Secondary | ICD-10-CM | POA: Diagnosis not present

## 2018-01-21 DIAGNOSIS — S4992XA Unspecified injury of left shoulder and upper arm, initial encounter: Secondary | ICD-10-CM | POA: Diagnosis not present

## 2018-01-21 DIAGNOSIS — S3992XA Unspecified injury of lower back, initial encounter: Secondary | ICD-10-CM | POA: Diagnosis not present

## 2018-01-21 MED ORDER — TRAMADOL HCL 50 MG PO TABS: 50.0000 mg | ORAL_TABLET | Freq: Three times a day (TID) | ORAL | 0 refills | Status: AC

## 2018-01-21 NOTE — ED Triage Notes (Signed)
Says Saturday she fell off a ladder.  She ssays she is hurting all over--her back and her right leg mostly.

## 2018-01-21 NOTE — ED Notes (Signed)
See triage note  Presents s/p fall from ladder on Saturday   Having generalized body pain  But having pain to right leg mainly  Ambulates with sl limp d/t pain

## 2018-01-21 NOTE — ED Provider Notes (Signed)
[facility] Emergency Department Provider Note  ____________________________________________  Time seen: Approximately 4:10 PM  I have reviewed the triage vital signs and the nursing notes.   HISTORY  Chief Complaint Back Pain; Leg Pain; and Fall    HPI Alyssa Perkins is a 67 y.o. female presents to the emergency department after a fall that occurred 3 days ago.  Patient reports that she fell from a height of approximately 4-1/2 feet and fell and hit the back of her head. She has neck stiffness but no neck pain. No new numbness or tinging in the hands or feet. Patient has had intermittent vertigo since incident occurred. She is also complaining of left shoulder pain, right wrist pain, diffuse back pain and generalized achiness.  She denies chest pain, chest tightness, shortness of breath, nausea, vomiting or abdominal pain.   Past Medical History:  Diagnosis Date  . Anginal pain ([place])   . Anxiety    not on any medication at this time  . Arthritis   . Asthma    "sports asthma or exercised induced"seen at [place] for primary  . Chronic back pain   . Chronic neck pain   . Complication of anesthesia    "difficulty waking up from surgery"  . Depression    has OCD, states counts constantly  . GERD (gastroesophageal reflux disease)   . Headache(784.0)    hx of migraines  . Hyperlipidemia   . IBS (irritable bowel syndrome)   . PONV (postoperative nausea and vomiting)   . RLS (restless legs syndrome)   . Sleep apnea   . Thyroid disease     Patient Active Problem List   Diagnosis Date Noted  . BPPV (benign paroxysmal positional vertigo), right 10/15/2017  . Gangrene associated with diabetes mellitus ([place]) 09/02/2017  . Diabetic retinopathy ([place]) 02/26/2017  . Insomnia 02/21/2017  . Chronic headaches 02/21/2017  . OSA on CPAP 10/21/2016  . Depression 10/15/2016  . Chronic back pain 09/11/2016  . Chronic neck pain 09/11/2016  .  RLS (restless legs syndrome) 07/08/2016  . Bilateral carpal tunnel syndrome 03/26/2016  . De Quervain's tenosynovitis, left 11/14/2015  . DM (diabetes mellitus), type 2, uncontrolled w/neurologic complication ([place]) 82/64/1583  . Hyperlipidemia associated with type 2 diabetes mellitus ([place]) 09/16/2014  . Hypertension 09/16/2014  . Acid reflux 09/16/2014  . Cervical radiculopathy 12/30/2011    Past Surgical History:  Procedure Laterality Date  . ABDOMINAL HYSTERECTOMY     also had oophorectomy  . AMPUTATION TOE Right 09/05/2017   Procedure: AMPUTATION TOE-RIGHT 2ND TOE;  Surgeon: Sharlotte Alamo, DPM;  Location: [facility] ORS;  Service: Podiatry;  Laterality: Right;  . ANTERIOR CERVICAL DECOMP/DISCECTOMY FUSION  01/02/2012   Procedure: ANTERIOR CERVICAL DECOMPRESSION/DISCECTOMY FUSION 2 LEVELS;  Surgeon: Melina Schools, MD;  Location: [place];  Service: Orthopedics;  Laterality: N/A;  ACDF C5-7  . BILATERAL CARPAL TUNNEL RELEASE    . CARDIAC CATHETERIZATION     done approx. 4- 5 years ago, Dr. Leanor Kail, does not see cardi. at this time.  . CHOLECYSTECTOMY    . DILATION AND CURETTAGE OF UTERUS     "several"  . HERNIA REPAIR    . IRRIGATION AND DEBRIDEMENT FOOT  09/05/2017   Procedure: IRRIGATION AND DEBRIDEMENT FOOT;  Surgeon: Sharlotte Alamo, DPM;  Location: [facility] ORS;  Service: Podiatry;;  . KNEE SURGERY     1 partial left, 1 total left  . SPINE SURGERY      Prior to Admission  medications   Medication Sig Start Date End Date Taking? Authorizing Provider  diclofenac sodium (VOLTAREN) 1 % GEL Apply 2 g topically 3 (three) times daily as needed. 11/13/17   [name], DO  Elastic Bandages & Supports (MEDICAL COMPRESSION STOCKINGS) [place] 1 each by Does not apply route daily. 11/14/15   Luciana Axe, NP  gabapentin (NEURONTIN) 100 MG capsule Take 4 capsules in AM and 4 capsules in PM Patient taking differently: Take 400 mg by mouth at bedtime.  06/06/16   Karamalegos, Devonne Doughty,  DO  glucose blood (FREESTYLE TEST STRIPS) test strip Use as instructed 03/29/16   [name], DO  glucose monitoring kit (FREESTYLE) monitoring kit 1 each by Does not apply route as needed for other. CHeck blood glucose once daily before insulin injections. For ICD 10 E11.65 11/14/15   Luciana Axe, NP  hydrOXYzine (ATARAX/VISTARIL) 25 MG tablet Take 1 tablet (25 mg total) by mouth daily as needed for itching. For itching 09/08/17   [name], DO  ibuprofen (ADVIL,MOTRIN) 200 MG tablet Take 600-800 mg by mouth every 6 (six) hours as needed for moderate pain.    [provider]  Insulin Glargine (TOUJEO SOLOSTAR) 300 UNIT/ML SOPN Inject 52 Units into the skin daily. Adjust dose as advised Patient taking differently: Inject 78 Units into the skin daily.  02/17/17   Karamalegos, Devonne Doughty, DO  Insulin Pen Needle 31G X 5 MM MISC 1 each by Does not apply route daily. Use to inject Toujeo daily. Dx:E11.9 11/28/15   Luciana Axe, NP  Lancets (FREESTYLE) lancets Use as instructed 03/29/16   Parks Ranger Devonne Doughty, DO  mupirocin ointment (BACTROBAN) 2 % Apply 1 application topically 2 (two) times daily. For 1-2 weeks, use 15-30 min after topical steroid 11/13/17   [name], DO  omeprazole (PRILOSEC) 20 MG capsule Take 1 capsule (20 mg total) by mouth daily. 10/15/17   Karamalegos, Devonne Doughty, DO  quinapril (ACCUPRIL) 20 MG tablet Take 1 tablet (20 mg total) by mouth at bedtime. 12/05/16   Karamalegos, Devonne Doughty, DO  sertraline (ZOLOFT) 50 MG tablet Take 1 tablet (50 mg total) by mouth daily. 12/05/16   Karamalegos, Devonne Doughty, DO  simvastatin (ZOCOR) 40 MG tablet Take 1 tablet (40 mg total) by mouth at bedtime. Reported on 05/16/2015 12/05/16   [name], DO  traMADol (ULTRAM) 50 MG tablet Take 1 tablet (50 mg total) by mouth every 8 (eight) hours for 3 days. 01/21/18 01/24/18  Lannie Fields, PA-C  traZODone (DESYREL) 50 MG tablet  trazodone 50 mg tablet 01/15/17   [provider]  triamcinolone cream (KENALOG) 0.5 % Apply 1 application topically 2 (two) times daily. To affected area, for up to 2 weeks. 11/13/17   [name], DO    Allergies Patient has no known allergies.  Family History  Problem Relation Age of Onset  . Heart disease Mother   . Osteoporosis Mother   . Breast cancer Mother        dx with ovarian ca 1st  . Ovarian cancer Mother        49's  . Osteoporosis Sister   . Diabetes Sister   . Breast cancer Sister        early 34's no braca testing   . Cancer Brother        sinus, bone  . Osteoporosis Brother     Social History Social History   Tobacco Use  .  Smoking status: Former Smoker    Packs/day: 0.50    Years: 15.00    Pack years: 7.50    Types: Cigarettes    Last attempt to quit: 07/17/1994    Years since quitting: 23.5  . Smokeless tobacco: Former Systems developer  . Tobacco comment: stopped 18 years ago  Substance Use Topics  . Alcohol use: No  . Drug use: No     Review of Systems  Constitutional: No fever/chills Eyes: No visual changes. No discharge ENT: No upper respiratory complaints. Cardiovascular: no chest pain. Respiratory: no cough. No SOB. Gastrointestinal: No abdominal pain.  No nausea, no vomiting.  No diarrhea.  No constipation. Musculoskeletal: Patient has back pain, left shoulder pain, right wrist pain Skin: Negative for rash, abrasions, lacerations, ecchymosis. Neurological: Patient has headache, no focal weakness or numbness.   ____________________________________________   PHYSICAL EXAM:  VITAL SIGNS: ED Triage Vitals  Enc Vitals Group     BP 01/21/18 1522 131/62     Pulse Rate 01/21/18 1522 81     Resp 01/21/18 1522 16     Temp 01/21/18 1522 97.6 F (36.4 C)     Temp Source 01/21/18 1522 Oral     SpO2 01/21/18 1522 96 %     Weight 01/21/18 1523 218 lb (98.9 kg)     Height 01/21/18 1523 '5\' 3"'$  (1.6 m)     Head Circumference --       Peak Flow --      Pain Score 01/21/18 1522 9     Pain Loc --      Pain Edu? --      Excl. in [place]? --      Constitutional: Alert and oriented. Well appearing and in no acute distress. Eyes: Conjunctivae are normal. PERRL. EOMI. Head: Atraumatic. ENT:      Ears: TMs are pearly.       Nose: No congestion/rhinnorhea.      Mouth/Throat: Mucous membranes are moist.  Neck: No stridor.  No cervical spine tenderness to palpation. Hematological/Lymphatic/Immunilogical: No cervical lymphadenopathy. Cardiovascular: Normal rate, regular rhythm. Normal S1 and S2.  Good peripheral circulation. Respiratory: Normal respiratory effort without tachypnea or retractions. Lungs CTAB. Good air entry to the bases with no decreased or absent breath sounds. Gastrointestinal: Bowel sounds 4 quadrants. Soft and nontender to palpation. No guarding or rigidity. No palpable masses. No distention. No CVA tenderness. Musculoskeletal: Patient has 5 out of 5 strength in the upper and lower extremities bilaterally and symmetrically.  Patient has pain with palpation over the right for and pain with range of motion of the left shoulder.  She has diffuse tenderness to palpation along the thoracic and lumbar spine with some midline tenderness.  Negative straight leg raise bilaterally. Neurologic:  Normal speech and language. No gross focal neurologic deficits are appreciated.  Skin:  Skin is warm, dry and intact. No rash noted. Psychiatric: Mood and affect are normal. Speech and behavior are normal. Patient exhibits appropriate insight and judgement.   ____________________________________________   LABS (all labs ordered are listed, but only abnormal results are displayed)  Labs Reviewed - No data to display ____________________________________________  EKG   ____________________________________________  RADIOLOGY I personally viewed and evaluated these images as part of my medical decision making, as well as  reviewing the written report by the radiologist.    Dg Chest 2 View  Result Date: 01/21/2018 CLINICAL DATA:  68 y/o  F; fall from ladder with pain all over. EXAM: CHEST - 2 VIEW  COMPARISON:  04/18/2017 chest radiograph. Concurrent thoracic spine radiographs. 12/02/2009 thoracic spine MRI. FINDINGS: Stable normal cardiac silhouette given projection and technique. Mild elevation of right hemidiaphragm. ACDF hardware noted. Right upper quadrant cholecystectomy clips. Clear lungs. No pleural effusion or pneumothorax. No acute osseous abnormality is evident. Stable mild anterior chronic compression deformity of T11. IMPRESSION: No acute process identified. Electronically Signed   By: Kristine Garbe M.D.   On: 01/21/2018 17:01   Dg Thoracic Spine 2 View  Result Date: 01/21/2018 CLINICAL DATA:  General pain after fall from ladder 4 days ago. EXAM: THORACIC SPINE 2 VIEWS; LUMBAR SPINE - 2-3 VIEW COMPARISON:  Lumbar spine radiographs September 13, 2016 FINDINGS: Thoracic spine: Mild-to-moderate stable T11 old compression fracture. Remaining thoracic vertebral bodies intact. Stable mild lower thoracic disc height loss and endplate spurring compatible with degenerative discs. ACDF. No destructive bony lesions. Prevertebral and paraspinal soft tissue planes are non suspicious. Lumbar spine: Five non rib-bearing lumbar-type vertebral bodies are intact. No malalignment. Maintained lumbar lordosis. Intervertebral disc heights maintained, similar mild ventral endplate spurring. Moderate lower lumbar facet arthropathy. No destructive bony lesions. Sacroiliac joints are symmetric. Included prevertebral and paraspinal soft tissue planes are non-suspicious. Surgical clips in the included right abdomen compatible with cholecystectomy. IMPRESSION: Thoracic spine: 1. No acute fracture deformity or malalignment. 2. Old mild-to-moderate T11 compression fracture. Lumbar spine: 1.  No acute fracture deformity or malalignment.  2. Stable degenerative change including moderate lower lumbar facet arthropathy. Electronically Signed   By: Elon Alas M.D.   On: 01/21/2018 17:00   Dg Lumbar Spine 2-3 Views  Result Date: 01/21/2018 CLINICAL DATA:  General pain after fall from ladder 4 days ago. EXAM: THORACIC SPINE 2 VIEWS; LUMBAR SPINE - 2-3 VIEW COMPARISON:  Lumbar spine radiographs September 13, 2016 FINDINGS: Thoracic spine: Mild-to-moderate stable T11 old compression fracture. Remaining thoracic vertebral bodies intact. Stable mild lower thoracic disc height loss and endplate spurring compatible with degenerative discs. ACDF. No destructive bony lesions. Prevertebral and paraspinal soft tissue planes are non suspicious. Lumbar spine: Five non rib-bearing lumbar-type vertebral bodies are intact. No malalignment. Maintained lumbar lordosis. Intervertebral disc heights maintained, similar mild ventral endplate spurring. Moderate lower lumbar facet arthropathy. No destructive bony lesions. Sacroiliac joints are symmetric. Included prevertebral and paraspinal soft tissue planes are non-suspicious. Surgical clips in the included right abdomen compatible with cholecystectomy. IMPRESSION: Thoracic spine: 1. No acute fracture deformity or malalignment. 2. Old mild-to-moderate T11 compression fracture. Lumbar spine: 1.  No acute fracture deformity or malalignment. 2. Stable degenerative change including moderate lower lumbar facet arthropathy. Electronically Signed   By: Elon Alas M.D.   On: 01/21/2018 17:00   Dg Forearm Left  Result Date: 01/21/2018 CLINICAL DATA:  Fall EXAM: LEFT FOREARM - 2 VIEW COMPARISON:  11/14/2015 FINDINGS: There is no evidence of fracture or other focal bone lesions. Soft tissues are unremarkable. IMPRESSION: Negative. Electronically Signed   By: Franchot Gallo M.D.   On: 01/21/2018 17:15   Ct Head Wo Contrast  Result Date: 01/21/2018 CLINICAL DATA:  Fall from ladder 4 days ago.  Headache. EXAM: CT HEAD  WITHOUT CONTRAST CT CERVICAL SPINE WITHOUT CONTRAST TECHNIQUE: Multidetector CT imaging of the head and cervical spine was performed following the standard protocol without intravenous contrast. Multiplanar CT image reconstructions of the cervical spine were also generated. COMPARISON:  CT head 07/02/2016 FINDINGS: CT HEAD FINDINGS Brain: Mild atrophy and mild chronic microvascular ischemic changes in the white matter. Negative for acute infarct, hemorrhage, or mass. No  midline shift. Vascular: Negative for hyperdense vessel Skull: Negative Sinuses/Orbits: Negative Other: None CT CERVICAL SPINE FINDINGS Alignment: Normal Skull base and vertebrae: Negative for fracture Soft tissues and spinal canal: Negative Disc levels: Right foraminal encroachment at C3-4 due to facet hypertrophy. Mild right foraminal narrowing at C4-5. ACDF with solid fusion at C5-6 and C6-7. Mild facet degeneration upper thoracic spine Upper chest: Negative Other: None IMPRESSION: No acute intracranial abnormality Negative for cervical spine fracture. Electronically Signed   By: Franchot Gallo M.D.   On: 01/21/2018 17:00   Ct Cervical Spine Wo Contrast  Result Date: 01/21/2018 CLINICAL DATA:  Fall from ladder 4 days ago.  Headache. EXAM: CT HEAD WITHOUT CONTRAST CT CERVICAL SPINE WITHOUT CONTRAST TECHNIQUE: Multidetector CT imaging of the head and cervical spine was performed following the standard protocol without intravenous contrast. Multiplanar CT image reconstructions of the cervical spine were also generated. COMPARISON:  CT head 07/02/2016 FINDINGS: CT HEAD FINDINGS Brain: Mild atrophy and mild chronic microvascular ischemic changes in the white matter. Negative for acute infarct, hemorrhage, or mass. No midline shift. Vascular: Negative for hyperdense vessel Skull: Negative Sinuses/Orbits: Negative Other: None CT CERVICAL SPINE FINDINGS Alignment: Normal Skull base and vertebrae: Negative for fracture Soft tissues and spinal canal:  Negative Disc levels: Right foraminal encroachment at C3-4 due to facet hypertrophy. Mild right foraminal narrowing at C4-5. ACDF with solid fusion at C5-6 and C6-7. Mild facet degeneration upper thoracic spine Upper chest: Negative Other: None IMPRESSION: No acute intracranial abnormality Negative for cervical spine fracture. Electronically Signed   By: Franchot Gallo M.D.   On: 01/21/2018 17:00   Dg Shoulder Left  Result Date: 01/21/2018 CLINICAL DATA:  67 y/o  F; fall from ladder with pain. EXAM: LEFT SHOULDER - 2+ VIEW COMPARISON:  None. FINDINGS: There is no evidence of fracture or dislocation. There is no evidence of arthropathy or other focal bone abnormality. Normal acromioclavicular and coracoclavicular intervals. Soft tissues are unremarkable. ACDF hardware noted. IMPRESSION: Negative. Electronically Signed   By: Kristine Garbe M.D.   On: 01/21/2018 16:56    ____________________________________________    PROCEDURES  Procedure(s) performed:    Procedures    Medications - No data to display   ____________________________________________   INITIAL IMPRESSION / ASSESSMENT AND PLAN / ED COURSE  Pertinent labs & imaging results that were available during my care of the patient were reviewed by me and considered in my medical decision making (see chart for details).  Review of the [place] CSRS was performed in accordance of the [place] prior to dispensing any controlled drugs.      Assessment and Plan:  Fall  Patient presents to the emergency department after a fall that occurred 3 days ago.  Patient reported right forearm pain, left shoulder pain, headache and general soreness.  Differential diagnosis included intracranial bleed, skull fracture, cervical spine fracture, compression fracture of the thoracic or lumbar spine, pneumothorax and fractures affecting the left shoulder and right forearm.  CT head and CT cervical spine were noncontributory for acute abnormality.   Chest x-ray revealed no evidence of pneumothorax.  No new compression fractures were identified on x-ray examination of the thoracic and lumbar spine.  Forearm and left shoulder x-rays revealed no acute abnormality.  Patient was discharged with a short course of tramadol.  She was given a referral to orthopedics.  All patient questions were answered.   ____________________________________________  FINAL CLINICAL IMPRESSION(S) / ED DIAGNOSES  Final diagnoses:  Fall, initial encounter  NEW MEDICATIONS STARTED DURING THIS VISIT:  ED Discharge Orders         Ordered    traMADol (ULTRAM) 50 MG tablet  Every 8 hours     01/21/18 1734              This chart was dictated using voice recognition software/Dragon. Despite best efforts to proofread, errors can occur which can change the meaning. Any change was purely unintentional.    Lannie Fields, PA-C 01/21/18 1914    Harvest Dark, MD 01/21/18 2259

## 2018-02-04 ENCOUNTER — Other Ambulatory Visit: Payer: Self-pay

## 2018-03-12 ENCOUNTER — Telehealth: Payer: Self-pay | Admitting: Family Medicine

## 2018-03-12 DIAGNOSIS — M5412 Radiculopathy, cervical region: Secondary | ICD-10-CM

## 2018-03-12 DIAGNOSIS — IMO0002 Reserved for concepts with insufficient information to code with codable children: Secondary | ICD-10-CM

## 2018-03-12 DIAGNOSIS — E785 Hyperlipidemia, unspecified: Principal | ICD-10-CM

## 2018-03-12 DIAGNOSIS — G5603 Carpal tunnel syndrome, bilateral upper limbs: Secondary | ICD-10-CM

## 2018-03-12 DIAGNOSIS — Z794 Long term (current) use of insulin: Secondary | ICD-10-CM

## 2018-03-12 DIAGNOSIS — E1140 Type 2 diabetes mellitus with diabetic neuropathy, unspecified: Secondary | ICD-10-CM

## 2018-03-12 DIAGNOSIS — I10 Essential (primary) hypertension: Secondary | ICD-10-CM

## 2018-03-12 DIAGNOSIS — E1165 Type 2 diabetes mellitus with hyperglycemia: Secondary | ICD-10-CM

## 2018-03-12 DIAGNOSIS — G44221 Chronic tension-type headache, intractable: Secondary | ICD-10-CM

## 2018-03-12 DIAGNOSIS — E1169 Type 2 diabetes mellitus with other specified complication: Secondary | ICD-10-CM

## 2018-03-12 DIAGNOSIS — K219 Gastro-esophageal reflux disease without esophagitis: Secondary | ICD-10-CM

## 2018-03-12 DIAGNOSIS — F3289 Other specified depressive episodes: Secondary | ICD-10-CM

## 2018-03-12 NOTE — Telephone Encounter (Signed)
Pt needs refills on all medications sent to [facility] except tramadol, that goes to [facility].  Her call back number is [phone]

## 2018-03-12 NOTE — Telephone Encounter (Signed)
Unable to reach patient no VM set up

## 2018-03-13 MED ORDER — OMEPRAZOLE 20 MG PO CPDR: 20.0000 mg | DELAYED_RELEASE_CAPSULE | Freq: Every day | ORAL | 3 refills | Status: DC

## 2018-03-13 MED ORDER — QUINAPRIL HCL 20 MG PO TABS: 20.0000 mg | ORAL_TABLET | Freq: Every day | ORAL | 3 refills | Status: DC

## 2018-03-13 MED ORDER — INSULIN GLARGINE (1 UNIT DIAL) 300 UNIT/ML ~~LOC~~ SOPN: 78.0000 [IU] | PEN_INJECTOR | Freq: Every day | SUBCUTANEOUS | 3 refills | Status: DC

## 2018-03-13 MED ORDER — HYDROXYZINE HCL 25 MG PO TABS: 25.0000 mg | ORAL_TABLET | Freq: Every day | ORAL | 1 refills | Status: DC | PRN

## 2018-03-13 MED ORDER — TRAMADOL HCL 50 MG PO TABS: 50.0000 mg | ORAL_TABLET | Freq: Every day | ORAL | 2 refills | Status: DC | PRN

## 2018-03-13 MED ORDER — TRAZODONE HCL 50 MG PO TABS: 50.0000 mg | ORAL_TABLET | Freq: Every day | ORAL | 3 refills | Status: DC

## 2018-03-13 MED ORDER — SIMVASTATIN 40 MG PO TABS: 40.0000 mg | ORAL_TABLET | Freq: Every day | ORAL | 3 refills | Status: DC

## 2018-03-13 MED ORDER — SERTRALINE HCL 50 MG PO TABS: 50.0000 mg | ORAL_TABLET | Freq: Every day | ORAL | 3 refills | Status: DC

## 2018-03-13 MED ORDER — GABAPENTIN 100 MG PO CAPS: 400.0000 mg | ORAL_CAPSULE | Freq: Every day | ORAL | 3 refills | Status: DC

## 2018-03-13 NOTE — Telephone Encounter (Signed)
Refilled all meds to [facility] mail. Except Tramadol sent to [facility] [place], checked [place] CSRS PMP AWARE updated for past 2 yr.  Nobie Putnam, [facility] 03/13/2018, 5:19 PM

## 2018-04-09 ENCOUNTER — Encounter: Payer: Self-pay | Admitting: Family Medicine

## 2018-04-09 ENCOUNTER — Ambulatory Visit (INDEPENDENT_AMBULATORY_CARE_PROVIDER_SITE_OTHER): Payer: Medicare Other | Admitting: Family Medicine

## 2018-04-09 VITALS — BP 120/74 | HR 78 | Temp 98.1°F | Resp 16 | Ht 63.0 in | Wt 215.6 lb

## 2018-04-09 DIAGNOSIS — R81 Glycosuria: Secondary | ICD-10-CM | POA: Diagnosis not present

## 2018-04-09 DIAGNOSIS — M65332 Trigger finger, left middle finger: Secondary | ICD-10-CM

## 2018-04-09 DIAGNOSIS — N3946 Mixed incontinence: Secondary | ICD-10-CM

## 2018-04-09 DIAGNOSIS — R7309 Other abnormal glucose: Secondary | ICD-10-CM | POA: Diagnosis not present

## 2018-04-09 DIAGNOSIS — F331 Major depressive disorder, recurrent, moderate: Secondary | ICD-10-CM | POA: Insufficient documentation

## 2018-04-09 DIAGNOSIS — E1149 Type 2 diabetes mellitus with other diabetic neurological complication: Secondary | ICD-10-CM

## 2018-04-09 DIAGNOSIS — E1165 Type 2 diabetes mellitus with hyperglycemia: Secondary | ICD-10-CM | POA: Diagnosis not present

## 2018-04-09 DIAGNOSIS — G5603 Carpal tunnel syndrome, bilateral upper limbs: Secondary | ICD-10-CM | POA: Diagnosis not present

## 2018-04-09 DIAGNOSIS — G44221 Chronic tension-type headache, intractable: Secondary | ICD-10-CM | POA: Diagnosis not present

## 2018-04-09 DIAGNOSIS — M5412 Radiculopathy, cervical region: Secondary | ICD-10-CM

## 2018-04-09 DIAGNOSIS — IMO0002 Reserved for concepts with insufficient information to code with codable children: Secondary | ICD-10-CM

## 2018-04-09 LAB — POCT URINALYSIS DIPSTICK
BILIRUBIN UA: NEGATIVE
Blood, UA: NEGATIVE
GLUCOSE UA: POSITIVE — AB
Leukocytes, UA: NEGATIVE
Nitrite, UA: NEGATIVE
PH UA: 5 (ref 5.0–8.0)
Protein, UA: NEGATIVE
Spec Grav, UA: 1.01 (ref 1.010–1.025)
UROBILINOGEN UA: 0.2 U/dL

## 2018-04-09 MED ORDER — INSULIN GLARGINE (1 UNIT DIAL) 300 UNIT/ML ~~LOC~~ SOPN: 62.0000 [IU] | PEN_INJECTOR | Freq: Every day | SUBCUTANEOUS | 1 refills | Status: DC

## 2018-04-09 MED ORDER — GABAPENTIN 400 MG PO CAPS: 400.0000 mg | ORAL_CAPSULE | Freq: Three times a day (TID) | ORAL | 1 refills | Status: DC

## 2018-04-09 MED ORDER — TRAMADOL HCL 50 MG PO TABS: 50.0000 mg | ORAL_TABLET | Freq: Every day | ORAL | 2 refills | Status: DC | PRN

## 2018-04-09 NOTE — Patient Instructions (Addendum)
Thank you for coming to the office today.  Please Return to [facility] for diabetes  I re ordered Toujeo through mail order  Caraccio, Bennett Scrape, MD  [address]  [id number]  [place], [place] [address]  [phone]   ---------  For incontinence  [place] -[address] [place],  [place]  [address] Phone: [phone]  -------- For neck and headache  Please go back to [facility] for these problems.  Ordered Gabapentin 400mg  capsules now in one pill - take one 3 times a day as needed - can increase night time dose to 2 capsules if tolerating and helping.  Re ordered tramadol/  Go ahead and try Trazodone for sleep   Please schedule a Follow-up Appointment to: Return in about 3 months (around 07/09/2018) for DM A1c, follow-up.  If you have any other questions or concerns, please feel free to call the office or send a message through [place]. You may also schedule an earlier appointment if necessary.  Additionally, you may be receiving a survey about your experience at our office within a few days to 1 week by e-mail or mail. We value your feedback.  Nobie Putnam, DO [place]

## 2018-04-09 NOTE — Progress Notes (Signed)
Subjective:    Patient ID: Alyssa Perkins, female    DOB: 25-Jul-1950, 68 y.o.   MRN: [id number]  Alyssa Perkins is a 68 y.o. female presenting on 04/09/2018 for Urinary Tract Infection (pressure while urinate, pinkish discharege, urgency onset couple of weeks); Urinary Incontinence; Headache (onset week after her fall from month ago); and Encopresis   HPI   Urinary Incontinence, Urgency / Glucosuria / Uncontrolled T2DM Reports feeling bladder and lower abdominal discomfort. Has urinary incontinence worsening recently. Has known prior incontinence with stress and some mixed urgency. Now suspects higher sugar causing symptoms. - Admits some pink discharge. History of hysterectomy. Denies vaginal bleeding - History of Uncontrolled Type 2 Diabetes, she said [place] does not carry strips, and she cannot test her sugar, could not afford ones recommended to her. She has not returned to [facility] for diabetes management, she is due for toujeo insulin refill. 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 Has some issues with remaining stub of toe. Admits neuropathy pain tingling  Fall Injury Reports few weeks ago she fell through the floor in her bedroom, she said floor broke and she fell down, injured her knees. Has other joint pain  Chronic Headaches Admits some worsening L sided headache. Known migraines and tension headaches. Due for refill tramadol. Taking PRN. Also for chronic pain takes Gabapentin helps her neuropathy, request adjust dose, previously on 100mg  x 4 = 400 at night with some relief. - Prior history of cervical DJD and radiculopathy Admits headache.  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 Recently worse with L finger has stiffness difficulty moving. Has triggering episode gets  stuck.  Health Maintenance: She will return for future follow-up for health maintenance and diabetes management.  Depression screen [facility] 2/9 04/09/2018 10/15/2017 02/21/2017  Decreased Interest 1 2 3   Down, Depressed, Hopeless 2 1 2   PHQ - 2 Score 3 3 5   Altered sleeping 1 3 3   Tired, decreased energy 3 2 3   Change in appetite 2 1 2   Feeling bad or failure about yourself  1 1 2   Trouble concentrating 1 3 2   Moving slowly or fidgety/restless 2 0 2  Suicidal thoughts 0 0 0  PHQ-9 Score 13 13 19   Difficult doing work/chores Somewhat difficult Somewhat difficult Not difficult at all  Some recent data might be hidden   GAD 7 : Generalized Anxiety Score 04/09/2018 10/15/2017 01/15/2017  Nervous, Anxious, on Edge 1 2 2   Control/stop worrying 2 2 3   Worry too much - different things 1 3 3   Trouble relaxing 1 3 2   Restless 1 2 2   Easily annoyed or irritable 2 2 3   Afraid - awful might happen 2 1 2   Total GAD 7 Score 10 15 17   Anxiety Difficulty Not difficult at all Somewhat difficult Somewhat difficult     Social History   Tobacco Use  . Smoking status: Former Smoker    Packs/day: 0.50    Years: 15.00    Pack years: 7.50    Types: Cigarettes    Last attempt to quit: 07/17/1994    Years since quitting: 23.7  . Smokeless tobacco: Former Systems developer  . Tobacco comment: stopped 18 years ago  Substance Use Topics  . Alcohol use: No  . Drug use: No    Review of  Systems Per HPI unless specifically indicated above     Objective:    BP 120/74   Pulse 78   Temp 98.1 F (36.7 C) (Oral)   Resp 16   Ht 5\' 3"  (1.6 m)   Wt 215 lb 9.6 oz (97.8 kg)   BMI 38.19 kg/m   Wt Readings from Last 3 Encounters:  04/09/18 215 lb 9.6 oz (97.8 kg)  01/21/18 218 lb (98.9 kg)  11/13/17 215 lb (97.5 kg)    Physical Exam Vitals signs and nursing note reviewed.  Constitutional:      General: She is not in acute distress.    Appearance: She is well-developed. She is not diaphoretic.     Comments:  Chronically ill appearing, slightly uncomfortable with headache, cooperative  HENT:     Head: Normocephalic and atraumatic.     Comments: Left posterior aspect and into neck muscles localized tenderness to touch. Limited neck ROM due to discomfort. Eyes:     General:        Right eye: No discharge.        Left eye: No discharge.     Conjunctiva/sclera: Conjunctivae normal.  Neck:     Musculoskeletal: Normal range of motion and neck supple.     Thyroid: No thyromegaly.  Cardiovascular:     Rate and Rhythm: Normal rate and regular rhythm.     Heart sounds: Normal heart sounds. No murmur.  Pulmonary:     Effort: Pulmonary effort is normal. No respiratory distress.     Breath sounds: Normal breath sounds. No wheezing or rales.  Musculoskeletal:     Comments: See foot exam. R 2nd toe s/p amputation  L hand middle finger with stiffness some difficulty with ROM due to triggering.  Lymphadenopathy:     Cervical: No cervical adenopathy.  Skin:    General: Skin is warm and dry.     Findings: No erythema or rash.  Neurological:     Mental Status: She is alert and oriented to person, place, and time.  Psychiatric:        Behavior: Behavior normal.     Comments: Well groomed, good eye contact, normal speech and thoughts      Diabetic Foot Exam - Simple   Simple Foot Form Diabetic Foot exam was performed with the following findings:  Yes 04/09/2018 11:01 AM  Visual Inspection See comments:  Yes Sensation Testing See comments:  Yes Pulse Check Posterior Tibialis and Dorsalis pulse intact bilaterally:  Yes Comments Bilateral feet dry callus formation heels and forefoot. R foot 2nd toe is s/p amputation with remaining stub present. Bilateral feet reduced monofilament in heels. And R foot reduced monofilament in Great toe.     Results for orders placed or performed in visit on 04/09/18  POCT Urinalysis Dipstick  Result Value Ref Range   Color, UA amber    Clarity, UA clear     Glucose, UA Positive (A) Negative   Bilirubin, UA negative    Ketones, UA trace    Spec Grav, UA 1.010 1.010 - 1.025   Blood, UA negative    pH, UA 5.0 5.0 - 8.0   Protein, UA Negative Negative   Urobilinogen, UA 0.2 0.2 or 1.0 E.U./dL   Nitrite, UA negative    Leukocytes, UA Negative Negative   Appearance clear    Odor none       Assessment & Plan:   Problem List Items Addressed This Visit    Bilateral carpal tunnel syndrome  Relevant Medications   gabapentin (NEURONTIN) 400 MG capsule   Cervical radiculopathy Acquired trigger finger of left middle finger       Relevant Medications   gabapentin (NEURONTIN) 400 MG capsule   traMADol (ULTRAM) 50 MG tablet  Clinically with several orthopedic complaints, with known OA/DJD multiple joints, history of cervical radiculopathy likely affecting some neck spasm and neck pain with radiation likely affecting her headache  Increase Gabapentin from 100mg  PRN and 400mg  nightly - up to 400mg  TID as tolerated, may double PM dose as well. Currently limited sedation  Refill Tramadol.    Chronic headaches   Relevant Medications   gabapentin (NEURONTIN) 400 MG capsule   traMADol (ULTRAM) 50 MG tablet   DM (diabetes mellitus), type 2, uncontrolled w/neurologic complication ([place])  Concern with poor control. Hyperglycemia. Limited ability to check sugars due to financial limitations Has not followed up as advised with Endocrinology at [facility], she was non adherent to medication in past, and limited due to financial barriers and adherence.  Due for A1c - will return to [facility] or can come back here for scheduled DM visit  DM foot exam done, needs to see Podiatry and Eye Doctor  Agrees to contact [facility] to re-schedule and re-establish, will need to take advantage of any cost saving opportunity they offer, financial assistance.  Re order toujeo insulin, emphasized adherence and follow-up if not improving    Relevant Medications   Insulin Glargine, 1  Unit Dial, (TOUJEO SOLOSTAR) 300 UNIT/ML SOPN    Other Visit Diagnoses    Elevated serum glucose with glucosuria    -  Primary  Secondary to uncontrolled DM Affecting her urinary urgency most likely    Relevant Orders   POCT Urinalysis Dipstick (Completed)      Mixed stress and urge urinary incontinence      Acute on chronic worsening, mixed incontinence. Some stress and urge now worsening with glucosuria.  Emphasized needs improved DM control, return to Endocrine.  Referral to [facility] for evaluation, may benefit from bladder scan and urodynamic or trial medication, concern cost limitations.    Relevant Orders   Ambulatory referral to Urology      Meds ordered this encounter  Medications  . gabapentin (NEURONTIN) 400 MG capsule    Sig: Take 1 capsule (400 mg total) by mouth 3 (three) times daily.    Dispense:  270 capsule    Refill:  1  . Insulin Glargine, 1 Unit Dial, (TOUJEO SOLOSTAR) 300 UNIT/ML SOPN    Sig: Inject 62 Units into the skin daily.    Dispense:  6 pen    Refill:  1  . traMADol (ULTRAM) 50 MG tablet    Sig: Take 1 tablet (50 mg total) by mouth daily as needed for moderate pain (headache).    Dispense:  30 tablet    Refill:  2   Orders Placed This Encounter  Procedures  . Ambulatory referral to Urology    Referral Priority:   Routine    Referral Type:   Consultation    Referral Reason:   Specialty Services Required    Requested Specialty:   Urology    Number of Visits Requested:   1  . POCT Urinalysis Dipstick     Follow up plan: Return in about 3 months (around 07/09/2018) for DM A1c, follow-up.  Nobie Putnam, [facility] 04/09/2018, 11:02 AM

## 2018-04-13 ENCOUNTER — Ambulatory Visit: Payer: Medicare Other | Admitting: Family Medicine

## 2018-04-13 ENCOUNTER — Telehealth: Payer: Self-pay | Admitting: Family Medicine

## 2018-04-13 NOTE — Telephone Encounter (Signed)
Pt was in last week for sick visit.  Daughter said pt is still coughing, running a fever and throwing up.  Please calll [name] [phone].  I put her on the schedule today.

## 2018-04-13 NOTE — Telephone Encounter (Signed)
Unable to reach patient she should keep her appointment was not seen for same Sx last week.

## 2018-04-14 ENCOUNTER — Emergency Department: Payer: Medicare Other

## 2018-04-14 ENCOUNTER — Encounter: Payer: Self-pay | Admitting: Emergency Medicine

## 2018-04-14 ENCOUNTER — Emergency Department
Admission: EM | Admit: 2018-04-14 | Discharge: 2018-04-14 | Disposition: A | Discharge status: Home / Self Care | Payer: Medicare Other | Attending: Emergency Medicine | Admitting: Emergency Medicine

## 2018-04-14 DIAGNOSIS — Z79899 Other long term (current) drug therapy: Secondary | ICD-10-CM | POA: Diagnosis not present

## 2018-04-14 DIAGNOSIS — R69 Illness, unspecified: Secondary | ICD-10-CM

## 2018-04-14 DIAGNOSIS — E119 Type 2 diabetes mellitus without complications: Secondary | ICD-10-CM | POA: Diagnosis not present

## 2018-04-14 DIAGNOSIS — E785 Hyperlipidemia, unspecified: Secondary | ICD-10-CM | POA: Diagnosis not present

## 2018-04-14 DIAGNOSIS — R51 Headache: Secondary | ICD-10-CM | POA: Diagnosis not present

## 2018-04-14 DIAGNOSIS — Z794 Long term (current) use of insulin: Secondary | ICD-10-CM | POA: Diagnosis not present

## 2018-04-14 DIAGNOSIS — J111 Influenza due to unidentified influenza virus with other respiratory manifestations: Secondary | ICD-10-CM | POA: Insufficient documentation

## 2018-04-14 DIAGNOSIS — Z87891 Personal history of nicotine dependence: Secondary | ICD-10-CM | POA: Insufficient documentation

## 2018-04-14 DIAGNOSIS — E109 Type 1 diabetes mellitus without complications: Secondary | ICD-10-CM | POA: Insufficient documentation

## 2018-04-14 DIAGNOSIS — I10 Essential (primary) hypertension: Secondary | ICD-10-CM | POA: Insufficient documentation

## 2018-04-14 DIAGNOSIS — J45909 Unspecified asthma, uncomplicated: Secondary | ICD-10-CM | POA: Diagnosis not present

## 2018-04-14 DIAGNOSIS — R5383 Other fatigue: Secondary | ICD-10-CM | POA: Diagnosis not present

## 2018-04-14 DIAGNOSIS — R05 Cough: Secondary | ICD-10-CM | POA: Diagnosis not present

## 2018-04-14 LAB — CBC WITH DIFFERENTIAL/PLATELET
ABS IMMATURE GRANULOCYTES: 0.06 10*3/uL (ref 0.00–0.07)
BASOS ABS: 0.1 10*3/uL (ref 0.0–0.1)
BASOS PCT: 1 %
Eosinophils Absolute: 0 10*3/uL (ref 0.0–0.5)
Eosinophils Relative: 0 %
HEMATOCRIT: 44.9 % (ref 36.0–46.0)
Hemoglobin: 14.6 g/dL (ref 12.0–15.0)
IMMATURE GRANULOCYTES: 1 %
LYMPHS ABS: 1.3 10*3/uL (ref 0.7–4.0)
Lymphocytes Relative: 13 %
MCH: 27 pg (ref 26.0–34.0)
MCHC: 32.5 g/dL (ref 30.0–36.0)
MCV: 83.1 fL (ref 80.0–100.0)
MONOS PCT: 9 %
Monocytes Absolute: 0.9 10*3/uL (ref 0.1–1.0)
NEUTROS ABS: 7.9 10*3/uL — AB (ref 1.7–7.7)
NEUTROS PCT: 76 %
NRBC: 0 % (ref 0.0–0.2)
PLATELETS: 210 10*3/uL (ref 150–400)
RBC: 5.4 MIL/uL — ABNORMAL HIGH (ref 3.87–5.11)
RDW: 14.4 % (ref 11.5–15.5)
WBC: 10.2 10*3/uL (ref 4.0–10.5)

## 2018-04-14 LAB — TROPONIN I: Troponin I: <0.03 ng/mL (ref <0.03)

## 2018-04-14 LAB — INFLUENZA PANEL BY PCR (TYPE A & B)
Influenza A By PCR: NEGATIVE
Influenza B By PCR: NEGATIVE

## 2018-04-14 LAB — BASIC METABOLIC PANEL
ANION GAP: 10 (ref 5–15)
BUN: 17 mg/dL (ref 8–23)
CO2: 22 mmol/L (ref 22–32)
Calcium: 8.5 mg/dL — ABNORMAL LOW (ref 8.9–10.3)
Chloride: 98 mmol/L (ref 98–111)
Creatinine, Ser: 1.01 mg/dL — ABNORMAL HIGH (ref 0.44–1.00)
GFR calc Af Amer: >60 mL/min (ref >60)
GFR calc non Af Amer: 58 mL/min — ABNORMAL LOW (ref >60)
GLUCOSE: 388 mg/dL — AB (ref 70–99)
POTASSIUM: 4.2 mmol/L (ref 3.5–5.1)
Sodium: 130 mmol/L — ABNORMAL LOW (ref 135–145)

## 2018-04-14 LAB — GLUCOSE, CAPILLARY: Glucose-Capillary: 305 mg/dL — ABNORMAL HIGH (ref 70–99)

## 2018-04-14 MED ORDER — ONDANSETRON HCL 4 MG/2ML IJ SOLN
4.0000 mg | Freq: Once | INTRAMUSCULAR | Status: AC
  Administered 2018-04-14: 4 mg via INTRAVENOUS
  Filled 2018-04-14: qty 2

## 2018-04-14 MED ORDER — GUAIFENESIN-CODEINE 100-10 MG/5ML PO SOLN: 5.0000 mL | Freq: Four times a day (QID) | ORAL | 0 refills | Status: DC | PRN

## 2018-04-14 MED ORDER — HYDROCOD POLST-CPM POLST ER 10-8 MG/5ML PO SUER
5.0000 mL | Freq: Once | ORAL | Status: AC
  Administered 2018-04-14: 5 mL via ORAL
  Filled 2018-04-14: qty 5

## 2018-04-14 MED ORDER — IBUPROFEN 600 MG PO TABS: 600.0000 mg | ORAL_TABLET | Freq: Three times a day (TID) | ORAL | 0 refills | Status: DC | PRN

## 2018-04-14 MED ORDER — SODIUM CHLORIDE 0.9 % IV BOLUS
1000.0000 mL | Freq: Once | INTRAVENOUS | Status: AC
  Administered 2018-04-14: 1000 mL via INTRAVENOUS

## 2018-04-14 NOTE — Discharge Instructions (Addendum)
It was noticed your blood glucose is elevated.  Advised to follow-up with your family doctor or endocrinologist to discuss glucose control.

## 2018-04-14 NOTE — ED Notes (Addendum)
See triage note  Presents with body aches prod cough and subjective fever  States she was seen by her PCP last week  But states feeling worse over the weekend.[name] afebrile on arrival  Family also states that she also is having on  Chest discomfort with cough  And some vomiting over the past day or so  Also has had some falls  [place] 3 times yesterday   Having some pain to back of neck   And also had some blurred vision

## 2018-04-14 NOTE — ED Notes (Signed)
Pt. Back from CT.

## 2018-04-14 NOTE — ED Triage Notes (Addendum)
Patient presents to ED via POV from home with multiple complaints including fatigue, cough, fevers and running nose since Friday. Even and non labored respirations noted. Mask placed on patient. Patient reports taking 1 tablet of tamiflu yesterday that was given to her by her daughter. Patient reports being seen by her PCP on Friday. Patient states "they didn't do a damn thing for me".

## 2018-04-14 NOTE — ED Provider Notes (Signed)
[facility] Emergency Department Provider Note   ____________________________________________   First MD Initiated Contact with Patient 04/14/18 1210     (approximate)  I have reviewed the triage vital signs and the nursing notes.   HISTORY  Chief Complaint Fever    HPI Alyssa Perkins is a 68 y.o. female patient complain of fever, cough, chest congestion and body aches for 4 days.  Patient states saw PCP 4 days ago for same complaint.  Patient states coughing spells causes urinary incontinence.  States nausea and vomiting but no diarrhea.  Patient had taken flu shot for this season.   Patient also states 3 days of severe headaches lasting 1 to 2 hours.  Patient states headache is always on the left side of her head and radiates to her neck.  Patient denies vision disturbance, vertigo, or weakness.  Patient also state that she has not eaten or drinking in 2 days.  Patient is a type I diabetic and states she used her insulin pen this morning.  Patient rates her pain discomfort as 8/10.  Patient described the pain is "achy".   Past Medical History:  Diagnosis Date  . Anginal pain ([place])   . Anxiety    not on any medication at this time  . Arthritis   . Asthma    "sports asthma or exercised induced"seen at [place] for primary  . Chronic back pain   . Chronic neck pain   . Complication of anesthesia    "difficulty waking up from surgery"  . Depression    has OCD, states counts constantly  . GERD (gastroesophageal reflux disease)   . Headache(784.0)    hx of migraines  . Hyperlipidemia   . IBS (irritable bowel syndrome)   . PONV (postoperative nausea and vomiting)   . RLS (restless legs syndrome)   . Sleep apnea   . Thyroid disease     Patient Active Problem List   Diagnosis Date Noted  . Moderate episode of recurrent major depressive disorder ([place]) 04/09/2018  . BPPV (benign paroxysmal positional vertigo), right 10/15/2017    . Diabetic retinopathy ([place]) 02/26/2017  . Insomnia 02/21/2017  . Chronic headaches 02/21/2017  . OSA on CPAP 10/21/2016  . Depression 10/15/2016  . Chronic back pain 09/11/2016  . Chronic neck pain 09/11/2016  . RLS (restless legs syndrome) 07/08/2016  . Bilateral carpal tunnel syndrome 03/26/2016  . De Quervain's tenosynovitis, left 11/14/2015  . DM (diabetes mellitus), type 2, uncontrolled w/neurologic complication ([place]) 35/32/9924  . Hyperlipidemia associated with type 2 diabetes mellitus ([place]) 09/16/2014  . Hypertension 09/16/2014  . Acid reflux 09/16/2014  . Cervical radiculopathy 12/30/2011    Past Surgical History:  Procedure Laterality Date  . ABDOMINAL HYSTERECTOMY     also had oophorectomy  . AMPUTATION TOE Right 09/05/2017   Procedure: AMPUTATION TOE-RIGHT 2ND TOE;  Surgeon: Sharlotte Alamo, DPM;  Location: [facility] ORS;  Service: Podiatry;  Laterality: Right;  . ANTERIOR CERVICAL DECOMP/DISCECTOMY FUSION  01/02/2012   Procedure: ANTERIOR CERVICAL DECOMPRESSION/DISCECTOMY FUSION 2 LEVELS;  Surgeon: Melina Schools, MD;  Location: [place];  Service: Orthopedics;  Laterality: N/A;  ACDF C5-7  . BILATERAL CARPAL TUNNEL RELEASE    . CARDIAC CATHETERIZATION     done approx. 4- 5 years ago, Dr. Leanor Kail, does not see cardi. at this time.  . CHOLECYSTECTOMY    . DILATION AND CURETTAGE OF UTERUS     "several"  . HERNIA REPAIR    .  IRRIGATION AND DEBRIDEMENT FOOT  09/05/2017   Procedure: IRRIGATION AND DEBRIDEMENT FOOT;  Surgeon: Sharlotte Alamo, DPM;  Location: [facility] OR[facility];  Service: Podiatry;;  . KNEE SURGERY     1 partial left, 1 total left  . SPINE SURGERY      Prior to Admission medications   Medication Sig Start Date End Date Taking? Authorizing Provider  diclofenac sodium (VOLTAREN) 1 % GEL Apply 2 g topically 3 (three) times daily as needed. 11/13/17   [name], DO  Elastic Bandages & Supports (MEDICAL COMPRESSION STOCKINGS) [place] 1 each by Does not apply route  daily. 11/14/15   Luciana Axe, NP  gabapentin (NEURONTIN) 400 MG capsule Take 1 capsule (400 mg total) by mouth 3 (three) times daily. 04/09/18   Karamalegos, Devonne Doughty, DO  glucose blood (FREESTYLE TEST STRIPS) test strip Use as instructed 03/29/16   [name], DO  glucose monitoring kit (FREESTYLE) monitoring kit 1 each by Does not apply route as needed for other. CHeck blood glucose once daily before insulin injections. For ICD 10 E11.65 11/14/15   Luciana Axe, NP  guaiFENesin-codeine 100-10 MG/5ML syrup Take 5 mLs by mouth every 6 (six) hours as needed for cough. 04/14/18   Sable Feil, PA-C  hydrOXYzine (ATARAX/VISTARIL) 25 MG tablet Take 1 tablet (25 mg total) by mouth daily as needed for itching. For itching 03/13/18   [name], DO  ibuprofen (ADVIL,MOTRIN) 200 MG tablet Take 600-800 mg by mouth every 6 (six) hours as needed for moderate pain.    [provider]  ibuprofen (ADVIL,MOTRIN) 600 MG tablet Take 1 tablet (600 mg total) by mouth every 8 (eight) hours as needed. 04/14/18   Sable Feil, PA-C  Insulin Glargine, 1 Unit Dial, (TOUJEO SOLOSTAR) 300 UNIT/ML SOPN Inject 62 Units into the skin daily. 04/09/18   Karamalegos, Devonne Doughty, DO  Insulin Pen Needle 31G X 5 MM MISC 1 each by Does not apply route daily. Use to inject Toujeo daily. Dx:E11.9 11/28/15   Luciana Axe, NP  Lancets (FREESTYLE) lancets Use as instructed 03/29/16   Par[place] Ranger Devonne Doughty, DO  mupirocin ointment (BACTROBAN) 2 % Apply 1 application topically 2 (two) times daily. For 1-2 weeks, use 15-30 min after topical steroid 11/13/17   [name], DO  omeprazole (PRILOSEC) 20 MG capsule Take 1 capsule (20 mg total) by mouth daily. 03/13/18   Karamalegos, Devonne Doughty, DO  quinapril (ACCUPRIL) 20 MG tablet Take 1 tablet (20 mg total) by mouth at bedtime. 03/13/18   Karamalegos, Devonne Doughty, DO  sertraline (ZOLOFT) 50 MG tablet Take 1 tablet (50 mg total)  by mouth daily. 03/13/18   Karamalegos, Devonne Doughty, DO  simvastatin (ZOCOR) 40 MG tablet Take 1 tablet (40 mg total) by mouth at bedtime. Reported on 05/16/2015 03/13/18   [name], DO  traMADol (ULTRAM) 50 MG tablet Take 1 tablet (50 mg total) by mouth daily as needed for moderate pain (headache). 04/09/18   Karamalegos, Devonne Doughty, DO  traZODone (DESYREL) 50 MG tablet Take 1 tablet (50 mg total) by mouth at bedtime. 03/13/18   Karamalegos, Devonne Doughty, DO  triamcinolone cream (KENALOG) 0.5 % Apply 1 application topically 2 (two) times daily. To affected area, for up to 2 weeks. 11/13/17   [name], DO    Allergies Patient has no known allergies.  Family History  Problem Relation Age of Onset  . Heart disease Mother   . Osteoporosis Mother   .  Breast cancer Mother        dx with ovarian ca 1st  . Ovarian cancer Mother        16's  . Osteoporosis Sister   . Diabetes Sister   . Breast cancer Sister        early 10's no braca testing   . Cancer Brother        sinus, bone  . Osteoporosis Brother     Social History Social History   Tobacco Use  . Smoking status: Former Smoker    Packs/day: 0.50    Years: 15.00    Pack years: 7.50    Types: Cigarettes    Last attempt to quit: 07/17/1994    Years since quitting: 23.7  . Smokeless tobacco: Former Systems developer  . Tobacco comment: stopped 18 years ago  Substance Use Topics  . Alcohol use: No  . Drug use: No    Review of Systems  Constitutional: No fever/chills Eyes: No visual changes. ENT: No sore throat. Cardiovascular: Denies chest pain. Respiratory: Denies shortness of breath. Gastrointestinal: No abdominal pain.  No nausea, no vomiting.  No diarrhea.  No constipation. Genitourinary: Negative for dysuria. Musculoskeletal: Negative for back pain. Skin: Negative for rash. Neurological: Negative for headaches, focal weakness or numbness. Psychiatric:  Anxiety depression. Endocrine:   Hyperlipidemia, hypertension, and hypothyroidism. ____________________________________________   PHYSICAL EXAM:  VITAL SIGNS: ED Triage Vitals [04/14/18 1108]  Enc Vitals Group     BP 135/60     Pulse Rate (!) 105     Resp 19     Temp 97.7 F (36.5 C)     Temp Source Oral     SpO2 95 %     Weight 215 lb (97.5 kg)     Height 5' 3" (1.6 m)     Head Circumference      Peak Flow      Pain Score 8     Pain Loc      Pain Edu?      Excl. in [place]?     Constitutional: Alert and oriented. Well appearing and in no acute distress. Eyes: Conjunctivae are normal. PERRL. EOMI. Head: Atraumatic. Nose: Edematous nasal turbinates clear rhinorrhea.   Mouth/Throat: Mucous membranes are moist.  Oropharynx non-erythematous.  Nasal drainage. Neck: No stridor.  Hematological/Lymphatic/Immunilogical: No cervical lymphadenopathy. Cardiovascular: Normal rate, regular rhythm. Grossly normal heart sounds.  Good peripheral circulation. Respiratory: Normal respiratory effort.  No retractions. Lungs lateral rales. Gastrointestinal: Soft and nontender. No distention. No abdominal bruits. No CVA tenderness. Neurologic:  Normal speech and language. No gross focal neurologic deficits are appreciated. No gait instability. Skin:  Skin is warm, dry and intact. No rash noted. Psychiatric: Mood and affect are normal. Speech and behavior are normal.  ____________________________________________   LABS (all labs ordered are listed, but only abnormal results are displayed)  Labs Reviewed  BASIC METABOLIC PANEL - Abnormal; Notable for the following components:      Result Value   Sodium 130 (*)    Glucose, Bld 388 (*)    Creatinine, Ser 1.01 (*)    Calcium 8.5 (*)    GFR calc non Af Amer 58 (*)    All other components within normal limits  CBC WITH DIFFERENTIAL/PLATELET - Abnormal; Notable for the following components:   RBC 5.40 (*)    Neutro Abs 7.9 (*)    All other components within normal limits    GLUCOSE, CAPILLARY - Abnormal; Notable for the following components:   Glucose-Capillary  305 (*)    All other components within normal limits  INFLUENZA PANEL BY PCR (TYPE A & B)  TROPONIN I  CBG MONITORING, ED   ____________________________________________  EKG Read by heart station Dr. with no acute findings.  ____________________________________________  RADIOLOGY  ED MD interpretation:    Official radiology report(s): Dg Chest 2 View  Result Date: 04/14/2018 CLINICAL DATA:  Cough, fatigue and fever since 04/09/2017. EXAM: CHEST - 2 VIEW COMPARISON:  PA and lateral chest 01/21/2018 and 09/30/2015. FINDINGS: The lungs are clear. Heart size is normal. No pneumothorax or pleural fluid. No acute or focal bony abnormality. IMPRESSION: No acute disease. Electronically Signed   By: Inge Rise M.D.   On: 04/14/2018 13:08   Ct Head Wo Contrast  Result Date: 04/14/2018 CLINICAL DATA:  Cough and fevers with fatigue EXAM: CT HEAD WITHOUT CONTRAST TECHNIQUE: Contiguous axial images were obtained from the base of the skull through the vertex without intravenous contrast. COMPARISON:  01/21/2018 FINDINGS: Brain: No evidence of acute infarction, hemorrhage, hydrocephalus, extra-axial collection or mass lesion/mass effect. Vascular: No hyperdense vessel or unexpected calcification. Skull: Normal. Negative for fracture or focal lesion. Sinuses/Orbits: No acute finding. Other: None. IMPRESSION: Normal head CT. Electronically Signed   By: Inez Catalina M.D.   On: 04/14/2018 12:49    ____________________________________________   PROCEDURES  Procedure(s) performed: None  Procedures  Critical Care performed: No  ____________________________________________   INITIAL IMPRESSION / ASSESSMENT AND PLAN / ED COURSE  As part of my medical decision making, I reviewed the following data within the [place]     Patient presents with approximate 5 days of fatigue, cough,  fever, and runny nose.  Patient is a type I diabetic who presents with a blood sugar 388 today.  Patient flu test was negative.  Chest x-ray was unremarkable.  Discussed negative CT of the head and neck with patient.  Patient complains consistent with viral illness.  Patient given discharge care instructions and advised to follow-up for PCP or endocrinologist for better glucose control.      ____________________________________________   FINAL CLINICAL IMPRESSION(S) / ED DIAGNOSES  Final diagnoses:  Influenza-like illness     ED Discharge Orders         Ordered    guaiFENesin-codeine 100-10 MG/5ML syrup  Every 6 hours PRN     04/14/18 1530    ibuprofen (ADVIL,MOTRIN) 600 MG tablet  Every 8 hours PRN     04/14/18 1530           Note:  This document was prepared using Dragon voice recognition software and may include unintentional dictation errors.    Sable Feil, PA-C 04/14/18 1535    Earleen Newport, MD 04/15/18 [phone]

## 2018-04-14 NOTE — ED Provider Notes (Addendum)
  EKG: Sinus rhythm rate 98 bpm, left axis deviation, possible LVH, normal QT.   Earleen Newport, MD 04/14/18 1234    Earleen Newport, MD 04/14/18 1235

## 2018-04-14 NOTE — ED Notes (Signed)
Patient reports she has had "nothing to eat or drink" since Sunday night. She cooked a roast and ate 1 bite and could not eat anymore

## 2018-04-29 IMAGING — MG MM DIGITAL SCREENING BILAT W/ TOMO W/ CAD
8 of 12 series · 8 of 28 positions shown · non-contrast
Comparison: Previous exam(s).

CLINICAL DATA: Screening.

EXAM:
2D DIGITAL SCREENING BILATERAL MAMMOGRAM WITH 3D TOMO WITH CAD

[R CC synth-2D]
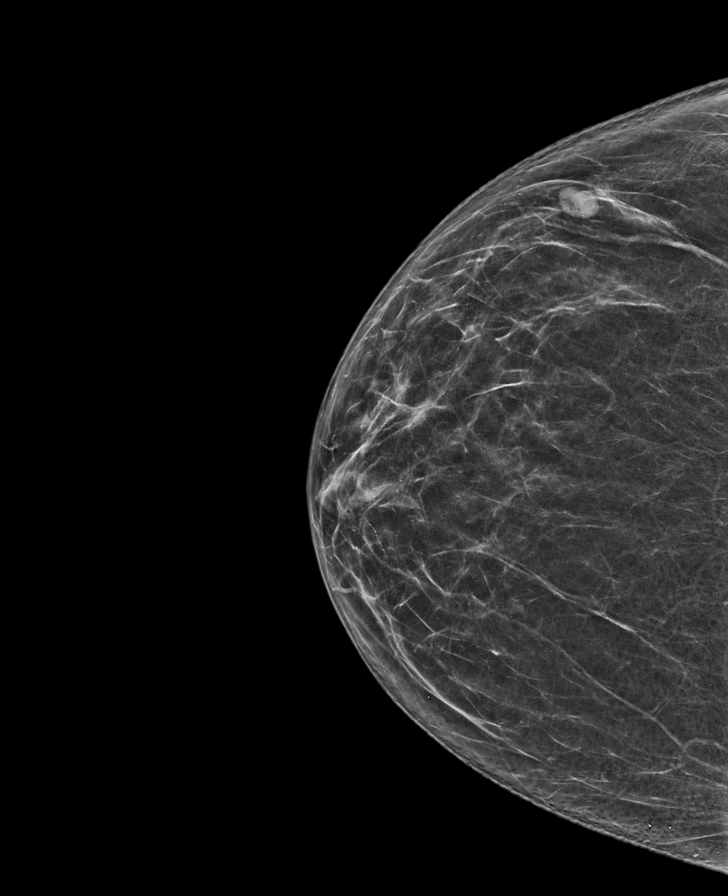

[L MLO]
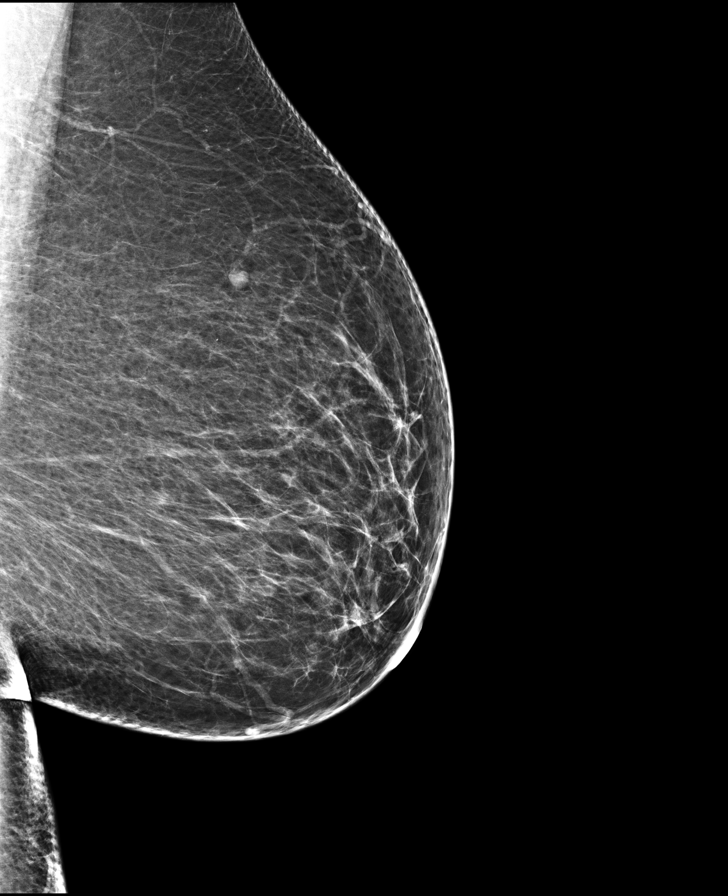

[L CC synth-2D]
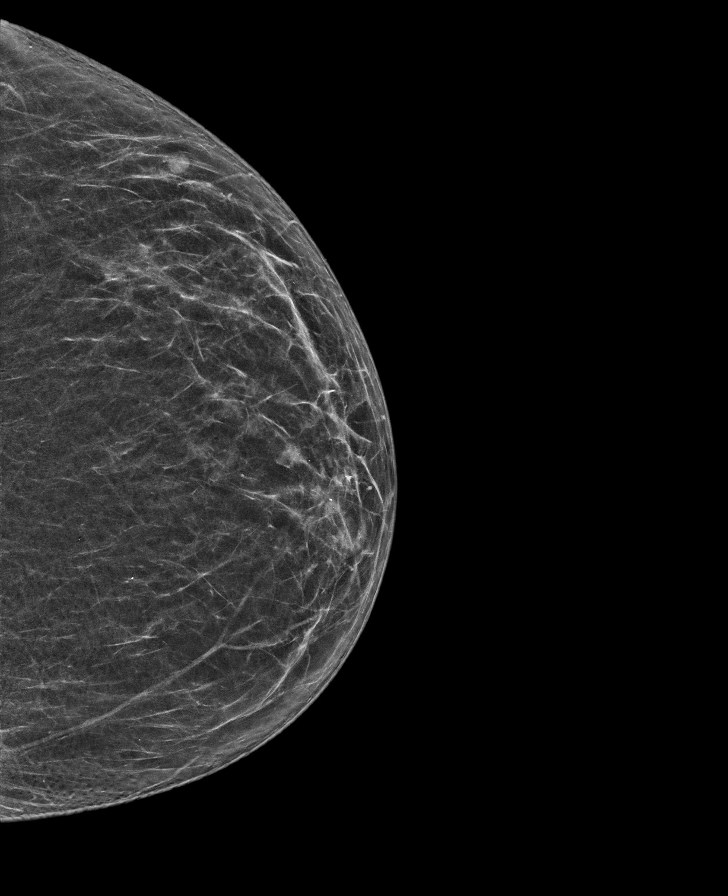

[L CC]
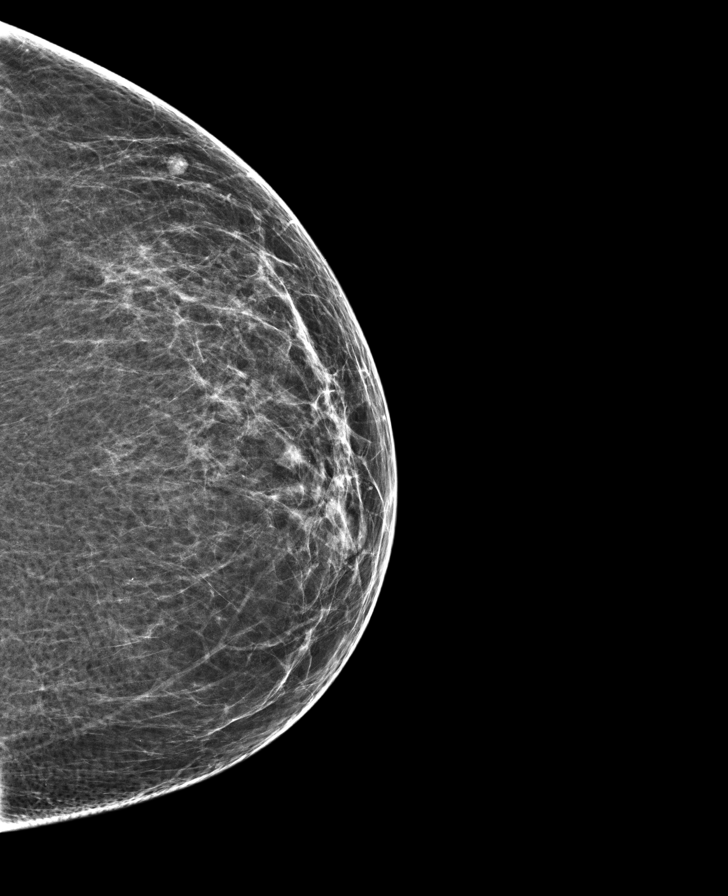

[L MLO synth-2D]
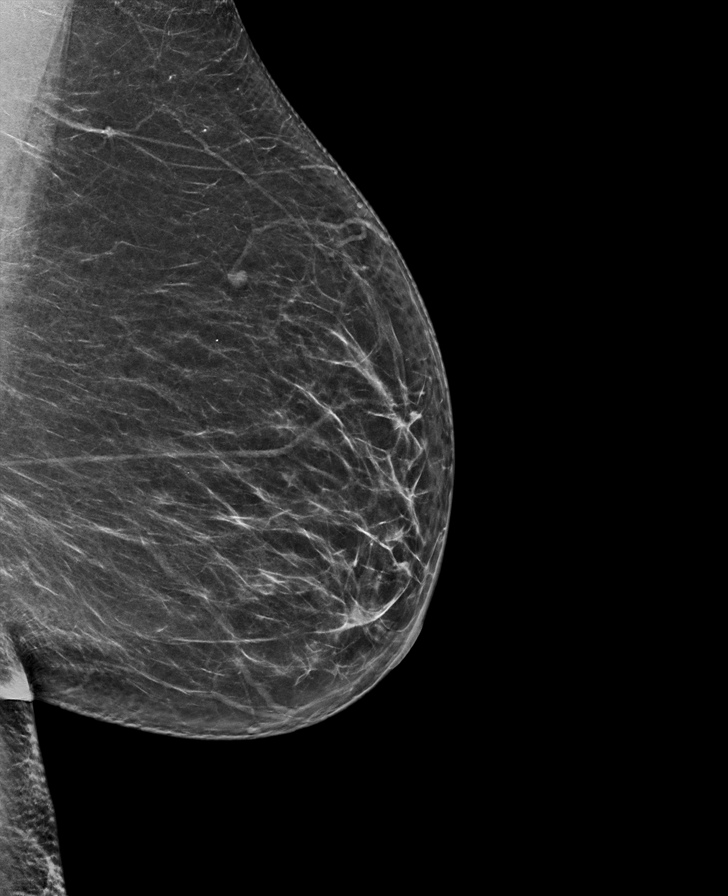

[R MLO]
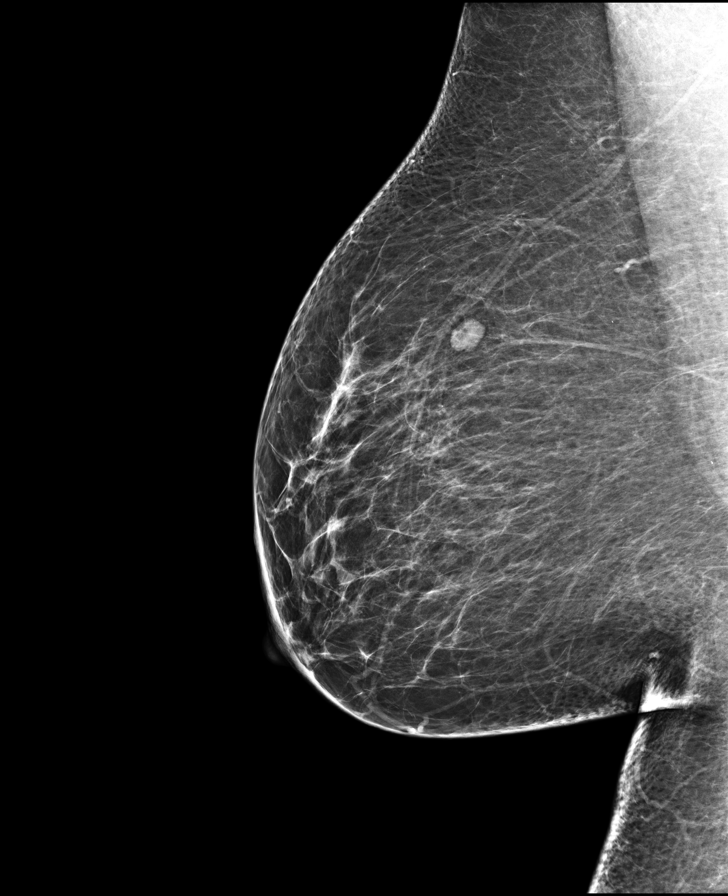

[R CC]
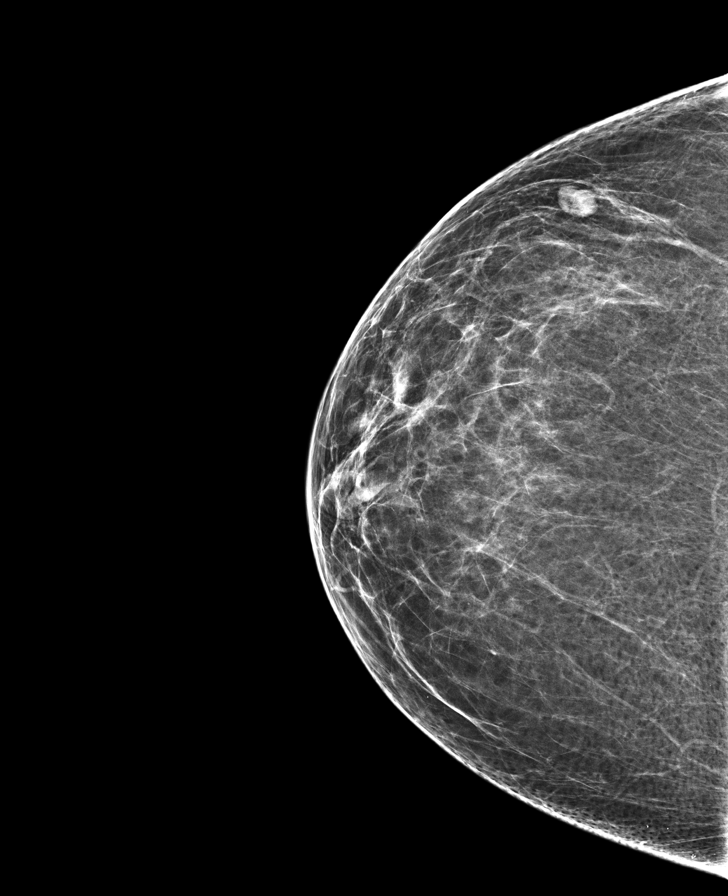

[R MLO synth-2D]
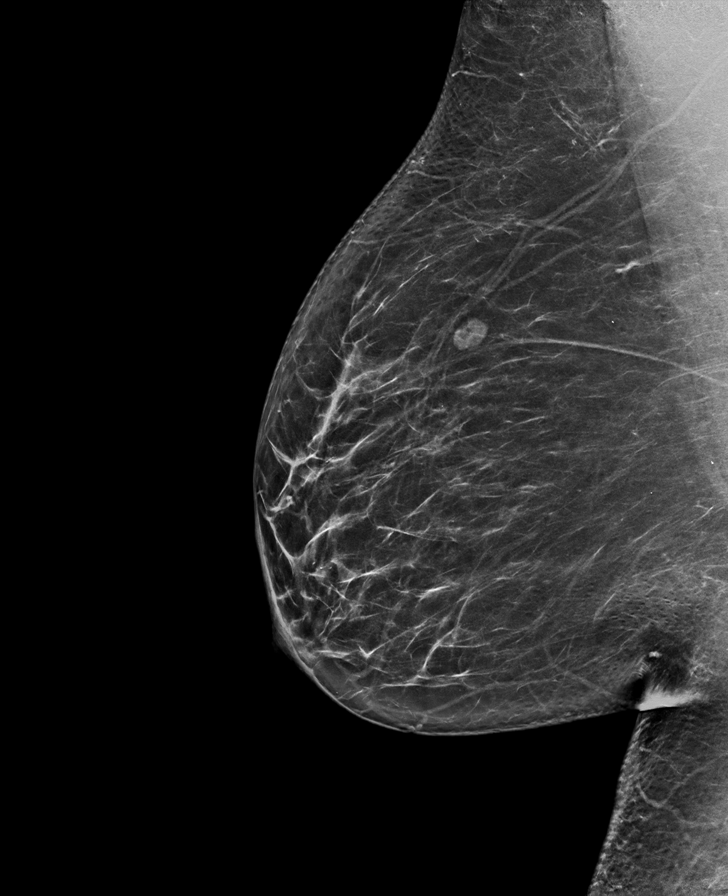

[8 of 28 positions shown; findings below may reference images not displayed]

ACR Breast Density Category b: There are scattered areas of
fibroglandular density.
FINDINGS: There are no findings suspicious for malignancy. Images were
processed with CAD.
IMPRESSION: No mammographic evidence of malignancy. A result letter of this
screening mammogram will be mailed directly to the patient.

RECOMMENDATION:
Screening mammogram in one year. (Code:[id number]-ZS0)

BI-RADS CATEGORY  1: Negative.

## 2018-05-04 ENCOUNTER — Encounter: Payer: Self-pay | Admitting: Urology

## 2018-05-04 ENCOUNTER — Ambulatory Visit: Payer: Medicare Other | Admitting: Urology

## 2018-06-16 DIAGNOSIS — E119 Type 2 diabetes mellitus without complications: Secondary | ICD-10-CM | POA: Diagnosis not present

## 2018-06-16 DIAGNOSIS — B351 Tinea unguium: Secondary | ICD-10-CM | POA: Diagnosis not present

## 2018-08-12 ENCOUNTER — Telehealth: Payer: Self-pay | Admitting: Family Medicine

## 2018-08-12 NOTE — Telephone Encounter (Signed)
@  [facility]   Outreach Note  08/12/2018 Name: AYLINN RYDBERG MRN: [id number] DOB: March 31, 1950  Referred by: [name], DO Reason for referral : Chronic Care Management (Initial outreach call was unsuccessful. )   An unsuccessful telephone outreach was attempted today. The patient was referred to the case management team by for assistance with chronic care management and care coordination.   Follow Up Plan: The CM team will reach out to the patient again over the next 7 days.  If patient returns call to provider office, please advise to call [place] at [place]  [email or web address]   ??[phone]

## 2018-08-17 ENCOUNTER — Ambulatory Visit: Payer: Self-pay | Admitting: Family Medicine

## 2018-08-17 NOTE — Chronic Care Management (AMB) (Signed)
Chronic Care Management   Note  08/17/2018 Name: [name] MRN: [id number] DOB: 02/23/1951  Alyssa Perkins is a 68 y.o. year old female who is a primary care patient of [name], DO. I reached out to [name] by phone today in response to a referral sent by Ms. Nolon [place]alls [place]'s health plan.    Ms. Kunzman was given information about Chronic Care Management services today including:  1. CCM service includes personalized support from designated clinical staff supervised by her physician, including individualized plan of care and coordination with other care providers 2. 24/7 contact phone numbers for assistance for urgent and routine care needs. 3. Service will only be billed when office clinical staff spend 20 minutes or more in a month to coordinate care. 4. Only one practitioner may furnish and bill the service in a calendar month. 5. The patient may stop CCM services at any time (effective at the end of the month) by phone call to the office staff. 6. The patient will be responsible for cost sharing (co-pay) of up to 20% of the service fee (after annual deductible is met).  Patient agreed to services and verbal consent obtained.   Follow up plan: Telephone appointment with CCM team member scheduled for:09/14/2018   [place]  [email or web address]   ??[phone]

## 2018-09-14 ENCOUNTER — Ambulatory Visit: Payer: Self-pay | Admitting: *Deleted

## 2018-09-14 ENCOUNTER — Telehealth: Payer: Medicare Other

## 2018-09-14 DIAGNOSIS — E1149 Type 2 diabetes mellitus with other diabetic neurological complication: Secondary | ICD-10-CM

## 2018-09-14 DIAGNOSIS — IMO0002 Reserved for concepts with insufficient information to code with codable children: Secondary | ICD-10-CM

## 2018-09-14 NOTE — Chronic Care Management (AMB) (Signed)
  Chronic Care Management   Outreach Note  09/14/2018 Name: Alyssa Perkins MRN: [id number] DOB: 02-Jan-1951  Referred by: [name], DO Reason for referral : Chronic Care Management (Unsuccessful initial outreach attempt )   An unsuccessful telephone outreach was attempted today. The patient was referred to the case management team by for assistance with chronic care management and care coordination. Attempted preferred number x2 unable to leave a message. Attempted home number also unable to leave a message.   Follow Up Plan: The care management team will reach out to the patient again over the next 30 days.   Alyssa Morse [name] RN, BSN Nurse Case Pharmacist, [facility]  [phone]) Business Mobile

## 2018-10-19 ENCOUNTER — Telehealth: Payer: Self-pay | Admitting: Family Medicine

## 2018-10-19 ENCOUNTER — Ambulatory Visit (INDEPENDENT_AMBULATORY_CARE_PROVIDER_SITE_OTHER): Payer: Medicare Other | Admitting: *Deleted

## 2018-10-19 DIAGNOSIS — IMO0002 Reserved for concepts with insufficient information to code with codable children: Secondary | ICD-10-CM

## 2018-10-19 DIAGNOSIS — E1149 Type 2 diabetes mellitus with other diabetic neurological complication: Secondary | ICD-10-CM

## 2018-10-19 DIAGNOSIS — E1165 Type 2 diabetes mellitus with hyperglycemia: Secondary | ICD-10-CM

## 2018-10-19 NOTE — Patient Instructions (Signed)
Thank you allowing the Chronic Care Management Team to be a part of your care! It was a pleasure speaking with you today!  CCM (Chronic Care Management) Team   Karisha Marlin RN, BSN Nurse Care Coordinator  [phone]  [name] PharmD  Clinical Pharmacist  [phone]  Eula Fried LCSW Clinical Social Worker [phone]  Goals Addressed            This Visit's Progress   . RNCM- I need help with my diabetes (pt-stated)       Current Barriers:  [name] Knowledge Deficits related to basic Diabetes pathophysiology and self care/management . Knowledge Deficits related to medications used for management of diabetes . Difficulty obtaining or cannot afford medications . Does not have glucometer to monitor blood sugar . Film/video editor . Transportation barriers  Case Manager Clinical Goal(s):  Over the next 120 days, patient will demonstrate improved adherence to prescribed treatment plan for diabetes self care/management as evidenced by:  . daily monitoring and recording of CBG  . adherence to ADA/ carb modified diet . adherence to prescribed medication regimen  Interventions:  . Provided education to patient about basic DM disease process . Reviewed medications with patient and discussed importance of medication adherence . Discussed plans with patient for ongoing care management follow up and provided patient with direct contact information for care management team . Collaboration with PCP on patient's c/o headache from recent falls . Referral sent for C-3 guide to assist with transportation resources . Referral sent for CCM-Pharmacist to assist with med adherence, assisting patient obtain a glucometer through [facility](pt gave [phone] as the number to order her meds)  . Discussed with patient obtaining an Endocrinologist closer to home related to transportation barriers  Patient Self Care Activities:  . UNABLE to independently manage diabetes as evidenced  by a recent A1C of 10.9  Initial goal documentation         The patient verbalized understanding of instructions provided today and declined a print copy of patient instruction materials.   The patient has been provided with contact information for the care management team and has been advised to call with any health related questions or concerns.

## 2018-10-19 NOTE — Chronic Care Management (AMB) (Signed)
Chronic Care Management   Initial Visit Note  10/19/2018 Name: Alyssa Perkins MRN: [id number] DOB: 02-03-1951  Referred by: [name], DO Reason for referral : Chronic Care Management (DM 2 )   Alyssa Perkins is a 68 y.o. year old female who is a primary care patient of [name], DO. The CCM team was consulted for assistance with chronic disease management and care coordination needs.   Review of patient status, including review of consultants reports, relevant laboratory and other test results, and collaboration with appropriate care team members and the patient's provider was performed as part of comprehensive patient evaluation and provision of chronic care management services.    SDOH (Social Determinants of Health) screening performed today. See Care Plan Entry related to challenges with:  Transportation Depression   Tobacco Use Physical Activity  Objective:   Goals Addressed            This Visit's Progress    RNCM- I need help with my diabetes (pt-stated)       Current Barriers:   Knowledge Deficits related to basic Diabetes pathophysiology and self care/management  Knowledge Deficits related to medications used for management of diabetes  Difficulty obtaining or cannot afford medications  Does not have glucometer to monitor blood sugar  Financial Constraints  Transportation barriers  Case Manager Clinical Goal(s):  Over the next 120 days, patient will demonstrate improved adherence to prescribed treatment plan for diabetes self care/management as evidenced by:   daily monitoring and recording of CBG   adherence to ADA/ carb modified diet  adherence to prescribed medication regimen  Interventions:   Provided education to patient about basic DM disease process  Reviewed medications with patient and discussed importance of medication adherence  Discussed plans with patient for ongoing care management follow up and  provided patient with direct contact information for care management team  Collaboration with PCP on patient's c/o headache from recent falls  Referral sent for C-3 guide to assist with transportation resources  Referral sent for CCM-Pharmacist to assist with med adherence, assisting patient obtain a glucometer through [facility](pt gave [phone] as the number to order her meds)   Discussed with patient obtaining an Endocrinologist closer to home related to transportation barriers  Patient Self Care Activities:   UNABLE to independently manage diabetes as evidenced by a recent A1C of 10.9  Initial goal documentation          Alyssa Perkins was given information about Chronic Care Management services today including:  1. CCM service includes personalized support from designated clinical staff supervised by her physician, including individualized plan of care and coordination with other care providers 2. 24/7 contact phone numbers for assistance for urgent and routine care needs. 3. Service will only be billed when office clinical staff spend 20 minutes or more in a month to coordinate care. 4. Only one practitioner may furnish and bill the service in a calendar month. 5. The patient may stop CCM services at any time (effective at the end of the month) by phone call to the office staff. 6. The patient will be responsible for cost sharing (co-pay) of up to 20% of the service fee (after annual deductible is met).  Patient agreed to services and verbal consent obtained.   Plan:   The care management team will reach out to the patient again over the next 30 days.  The patient has been provided with contact information for the care management team and has  been advised to call with any health related questions or concerns.   Merlene Morse Eual Lindstrom RN, BSN Nurse Case Pharmacist, [facility]  [phone]) Business Mobile

## 2018-10-19 NOTE — Telephone Encounter (Signed)
Patient was evaluated by Merlene Morse Minor LPN for CCM Nurse Case Management to help with her adherence to meds, follow-up apt and other chronic health conditions.  Patient had several medical complaints, she is requesting an apt after 3:30pm and her daughter can bring her with transportation at some point.  Could you call patient to clarify what her current symptoms are, and when she is agreeable to schedule to be seen?   Most likely we can discuss her headaches and her diabetes, and she may need a referral to local Endocrinology at [facility] next if she doesn't have other transportation to get to [facility] anymore.  Nobie Putnam, DO [facility] 10/19/2018, 4:43 PM

## 2018-10-20 NOTE — Telephone Encounter (Signed)
Spoke to the patient having dizzy spills since fall in bathtub month ago, still has some back and neck pain and wants to check her sugar level and also headache is getting worst mostly on Right side. Appointment scheduled for tomorrow wants something late after 3:30 pm appt since she has to depend on her daughter for ride.

## 2018-10-21 ENCOUNTER — Ambulatory Visit (INDEPENDENT_AMBULATORY_CARE_PROVIDER_SITE_OTHER): Payer: Medicare Other | Admitting: Family Medicine

## 2018-10-21 ENCOUNTER — Other Ambulatory Visit: Payer: Self-pay

## 2018-10-21 ENCOUNTER — Encounter: Payer: Self-pay | Admitting: Family Medicine

## 2018-10-21 VITALS — BP 136/65 | HR 85 | Temp 98.6°F | Resp 16 | Ht 63.0 in | Wt 215.0 lb

## 2018-10-21 DIAGNOSIS — E11319 Type 2 diabetes mellitus with unspecified diabetic retinopathy without macular edema: Secondary | ICD-10-CM | POA: Diagnosis not present

## 2018-10-21 DIAGNOSIS — Z794 Long term (current) use of insulin: Secondary | ICD-10-CM | POA: Diagnosis not present

## 2018-10-21 DIAGNOSIS — F331 Major depressive disorder, recurrent, moderate: Secondary | ICD-10-CM | POA: Diagnosis not present

## 2018-10-21 DIAGNOSIS — E1165 Type 2 diabetes mellitus with hyperglycemia: Secondary | ICD-10-CM | POA: Diagnosis not present

## 2018-10-21 DIAGNOSIS — E1140 Type 2 diabetes mellitus with diabetic neuropathy, unspecified: Secondary | ICD-10-CM | POA: Diagnosis not present

## 2018-10-21 DIAGNOSIS — IMO0002 Reserved for concepts with insufficient information to code with codable children: Secondary | ICD-10-CM

## 2018-10-21 DIAGNOSIS — G44221 Chronic tension-type headache, intractable: Secondary | ICD-10-CM

## 2018-10-21 LAB — POCT GLYCOSYLATED HEMOGLOBIN (HGB A1C): Hemoglobin A1C: 14 % — AB (ref 4.0–5.6)

## 2018-10-21 MED ORDER — OZEMPIC (0.25 OR 0.5 MG/DOSE) 2 MG/1.5ML ~~LOC~~ SOPN: 0.2500 mg | PEN_INJECTOR | SUBCUTANEOUS | 0 refills | Status: DC

## 2018-10-21 MED ORDER — TOUJEO SOLOSTAR 300 UNIT/ML ~~LOC~~ SOPN: 65.0000 [IU] | PEN_INJECTOR | Freq: Every day | SUBCUTANEOUS | 1 refills | Status: DC

## 2018-10-21 NOTE — Progress Notes (Signed)
Subjective:    Patient ID: Alyssa Perkins, female    DOB: May 27, 1950, 68 y.o.   MRN: [id number]  Alyssa Perkins is a 68 y.o. female presenting on 10/21/2018 for Dizziness (neck pain, back pain, hyperglycemic Sxs couple of months)   HPI   CHRONIC DM, Type 2, Uncontrolled. Reports that she has been off insulin when ran out of medicine. She admits poor diet and lifestyle. Multiple barriers. CBGs: Not checking, no glucometer, never got glucometer previously ordered for her Meds: Tresiba insulin 62 units, she has not increased as advised. Currently on ACEi Lifestyle: - Diet (not adhering to DM diet)  - Exercise (no regular exercise) History of cataracts s/p removal. Now with some worsening vision, has not returned to eye doctor yet, but agrees to schedule Previously followed by [facility] Endocrinology but cannot return to them due to lack of transportation. 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  Major Depression, recurrent moderate She said doesn't take Sertraline anymore, doesn't remember if it helped or not. Was on - Sertraline 50mg  daily, - Trazodone 50mg  QHS PRN only - Stays awake until 3am, only sleep for half hour to 1 hour  Chronic Headache Recent flare up R eye, stabbing Takes Tramadol for pain. Chronic headache - Prior history of cervical DJD and radiculopathy     Depression screen [facility] 2/9 10/21/2018 04/09/2018 10/15/2017  Decreased Interest 3 1 2   Down, Depressed, Hopeless 2 2 1   PHQ - 2 Score 5 3 3   Altered sleeping 1 1 3   Tired, decreased energy 1 3 2   Change in appetite 2 2 1   Feeling bad or failure about yourself  1 1 1   Trouble concentrating 1 1 3   Moving slowly or fidgety/restless 0 2 0  Suicidal thoughts 1 0 0  PHQ-9 Score 12 13 13   Difficult doing work/chores Somewhat difficult Somewhat  difficult Somewhat difficult  Some recent data might be hidden   GAD 7 : Generalized Anxiety Score 10/21/2018 04/09/2018 10/15/2017 01/15/2017  Nervous, Anxious, on Edge 2 1 2 2   Control/stop worrying 3 2 2 3   Worry too much - different things 2 1 3 3   Trouble relaxing 2 1 3 2   Restless 2 1 2 2   Easily annoyed or irritable 2 2 2 3   Afraid - awful might happen 2 2 1 2   Total GAD 7 Score 15 10 15 17   Anxiety Difficulty Somewhat difficult Not difficult at all Somewhat difficult Somewhat difficult      Social History   Tobacco Use  . Smoking status: Former Smoker    Packs/day: 0.50    Years: 15.00    Pack years: 7.50    Types: Cigarettes    Quit date: 07/17/1994    Years since quitting: 24.2  . Smokeless tobacco: Former Systems developer  . Tobacco comment: stopped 18 years ago  Substance Use Topics  . Alcohol use: No  . Drug use: No    Review of Systems Per HPI unless specifically indicated above     Objective:    BP 136/65   Pulse 85   Temp 98.6 F (37 C) (Oral)   Resp 16   Ht 5\' 3"  (1.6 m)   Wt 215 lb (97.5 kg)   BMI 38.09 kg/m   Wt Readings from Last 3 Encounters:  10/21/18 215 lb (97.5 kg)  04/14/18 215 lb (  97.5 kg)  04/09/18 215 lb 9.6 oz (97.8 kg)    Physical Exam Vitals signs and nursing note reviewed.  Constitutional:      General: She is not in acute distress.    Appearance: She is well-developed. She is not diaphoretic.     Comments: Well-appearing, comfortable, cooperative, obese  HENT:     Head: Normocephalic and atraumatic.  Eyes:     General:        Right eye: No discharge.        Left eye: No discharge.     Conjunctiva/sclera: Conjunctivae normal.  Neck:     Musculoskeletal: Normal range of motion and neck supple.     Thyroid: No thyromegaly.  Cardiovascular:     Rate and Rhythm: Normal rate and regular rhythm.     Heart sounds: Normal heart sounds. No murmur.  Pulmonary:     Effort: Pulmonary effort is normal. No respiratory distress.     Breath  sounds: Normal breath sounds. No wheezing or rales.  Musculoskeletal: Normal range of motion.  Lymphadenopathy:     Cervical: No cervical adenopathy.  Skin:    General: Skin is warm and dry.     Findings: No erythema or rash.  Neurological:     Mental Status: She is alert and oriented to person, place, and time.  Psychiatric:        Behavior: Behavior normal.     Comments: Well groomed, good eye contact, normal speech and thoughts      Diabetic Foot Exam - Simple   Simple Foot Form Diabetic Foot exam was performed with the following findings: Yes 10/21/2018  4:29 PM  Visual Inspection See comments: Yes Sensation Testing See comments: Yes Pulse Check Posterior Tibialis and Dorsalis pulse intact bilaterally: Yes Comments Right Foot 2nd toe s/p ray amputation well healed. Tender, without swelling or erythema. Diffuse reduced monofilament sensation bilateral feet. Callus formation bilateral. No ulceration of foot.      Recent Labs    10/21/18 1629  HGBA1C 14.0*     Results for orders placed or performed in visit on 10/21/18  POCT HgB A1C  Result Value Ref Range   Hemoglobin A1C 14.0 (A) 4.0 - 5.6 %      Assessment & Plan:   Problem List Items Addressed This Visit    Chronic headaches Uncertain etiology, likely combination of poor glycemic control, prior injury and chronic Cervical spine DJD Treat underlying diabetes, advised that in future if acute injury can go to hospital ED    Diabetic retinopathy ([place]) Complication of uncontrolled DM Return to Ophthalmology   Relevant Medications   Insulin Glargine, 1 Unit Dial, (TOUJEO SOLOSTAR) 300 UNIT/ML SOPN   Semaglutide,0.25 or 0.5MG /DOS, (OZEMPIC, 0.25 OR 0.5 MG/DOSE,) 2 MG/1.5ML SOPN   Moderate episode of recurrent major depressive disorder (HCC) Chronic problem, affecting generalized health and daily function due to lack of motivation Previously on sertraline,d id not adjust dose On infrequent trazodone limited  improve insomnia, multifactorial mood/anxiety and pain  Restart Sertraline 50mg  daily, titrate up to 75 then 100 anticipated within 4 weeks if needed Re order Trazodone 50mg  nightly PRN can increase up to 1.5 pills then up to 2 pills if needed      Other Visit Diagnoses    Uncontrolled type 2 diabetes mellitus with diabetic neuropathy, with long-term current use of insulin (HCC)    -  Primary   Relevant Medications   Insulin Glargine, 1 Unit Dial, (TOUJEO SOLOSTAR) 300 UNIT/ML SOPN  Semaglutide,0.25 or 0.5MG /DOS, (OZEMPIC, 0.25 OR 0.5 MG/DOSE,) 2 MG/1.5ML SOPN   Other Relevant Orders   POCT HgB A1C (Completed)   Ambulatory referral to Endocrinology      Severely uncontrolled DM with A1c >14% on POC check Secondary to poor lifestyle, lack of adherence to DM diet, exercise, and medications  Complications - CKD-II to III, peripheral neuropathy, DM retinopathy / cataracts other including hyperlipidemia GERD, depression, obesity, hypothyroidism, OSA - increases risk of future cardiovascular complications // poor glucose control due to reduced lifestyle diet/exercise with low energy mood and fatigue  Plan:  Referral to [facility] Endocrinology, local endocrinology specialist  1. Increase Toujeo from 62 up to 65 - continue titrate up as advised if fasting CBG consistently >150, up by 1- 2unit weekly - ADD Ozempic sample today 0.25mg  weekly x 4 weeks then 0.5mg  weekly - will rx if need otherwise can defer to Endocrinology if can get covered for pt 2. Encourage improved lifestyle - low carb, low sugar diet, reduce portion size, start regular exercise 3. Due for new glucometer. 4. DM Foot exam done today / Advised to schedule DM ophtho exam, send record 5. Follow-up 4 wk   Meds ordered this encounter  Medications  . Insulin Glargine, 1 Unit Dial, (TOUJEO SOLOSTAR) 300 UNIT/ML SOPN    Sig: Inject 65 Units into the skin daily.    Dispense:  6 pen    Refill:  1  . Semaglutide,0.25 or  0.5MG /DOS, (OZEMPIC, 0.25 OR 0.5 MG/DOSE,) 2 MG/1.5ML SOPN    Sig: Inject 0.25 mg into the skin once a week. For first 4 weeks. Then increase dose to 0.5mg  weekly    Dispense:  1 pen    Refill:  0   Orders Placed This Encounter  Procedures  . Ambulatory referral to Endocrinology    Referral Priority:   Routine    Referral Type:   Consultation    Referral Reason:   Specialty Services Required    Number of Visits Requested:   1  . POCT HgB A1C     Follow up plan: Return in about 4 weeks (around 11/18/2018) for Virtual telephone call in 4 weeks for Diabetes.  Nobie Putnam, DO [facility] 10/21/2018, 4:15 PM

## 2018-10-21 NOTE — Patient Instructions (Addendum)
Thank you for coming to the office today.  Re ordered Toujeo 65 units now, increase. Once daily, sent to [facility] for diabetes. Ozempic - sample today, once WEEKLY, dose 0.25mg  weekly, can increase after 4 weeks up to 0.5mg  ASK ENDOCRINOLOGY Doctor about this medicine to get it approved and new diabetes testing supplies.  Referral Diabetes doctor locally in [facility] [place], [place]  [address] Phone: [phone]  RESTART Sertraline 50mg  once daily, after 4 weeks can increase to one and HALF pill and possibly up to 2 pills - we can discuss on phone.  INCREASE Trazodone from 50mg  one pill nightly as needed for sleep - up to one and HALF pill for 75mg  or eventually up to 2 pills as needed for sleep.  Follow up with Podiatry for your foot.  Your provider would like to you have your annual eye exam. Please contact your current eye doctor or here are some good options for you to contact.   [facility]   Address: [address] [place], [place] [address] Phone: [phone]  Website: [email or web address] [address] [place], [place] [address] Phone: [phone] [email or web address]  [facility]  Address: [place], [place], [place] [address] Phone: [phone]   [facility] [address] [place], [place] [address] Phone: [phone]  [facility] Address: [place], [place], [place] [address]  Phone: [phone]   Please schedule a Follow-up Appointment to: Return in about 4 weeks (around 11/18/2018) for Virtual telephone call in 4 weeks for Diabetes.  If you have any other questions or concerns, please feel free to call the office or send a message through [place]. You may also schedule an earlier appointment if necessary.  Additionally, you may be receiving a survey about your experience at our office within a few days to 1 week by e-mail or mail.  We value your feedback.  Nobie Putnam, DO [place]

## 2018-10-22 ENCOUNTER — Encounter: Payer: Self-pay | Admitting: Family Medicine

## 2018-10-22 ENCOUNTER — Ambulatory Visit: Payer: Self-pay | Admitting: Pharmacist

## 2018-10-22 DIAGNOSIS — B351 Tinea unguium: Secondary | ICD-10-CM | POA: Diagnosis not present

## 2018-10-22 DIAGNOSIS — IMO0002 Reserved for concepts with insufficient information to code with codable children: Secondary | ICD-10-CM

## 2018-10-22 DIAGNOSIS — E1149 Type 2 diabetes mellitus with other diabetic neurological complication: Secondary | ICD-10-CM

## 2018-10-22 DIAGNOSIS — M79671 Pain in right foot: Secondary | ICD-10-CM | POA: Diagnosis not present

## 2018-10-22 NOTE — Chronic Care Management (AMB) (Signed)
  Care Management Note   Alyssa Perkins is a 68 y.o. year old female who is a primary care patient of [name], DO. The CM team was consulted for assistance with chronic disease management and care coordination.   Receive referral from [place] for medication adherence and assistance with obtaining new glucometer.  I reached out to [name] by phone today. Unable to leave message on either mobile or home phone as no voicemail picks up.   Follow Up Plan: The care management team will reach out to the patient again over the next 7 days.    [name], PharmD, [place] [employer] [phone]

## 2018-10-23 ENCOUNTER — Telehealth: Payer: Self-pay | Admitting: Family Medicine

## 2018-10-23 ENCOUNTER — Encounter: Payer: Self-pay | Admitting: Family Medicine

## 2018-10-23 NOTE — Telephone Encounter (Signed)
° ° °  Called pt regarding Alyssa Perkins Referral for transportation, pt does not have an immediate need for transportation and asked that I mail her the information for [employer], I told her I would include my phone # and Email as well if she needs help setting up transportation in the future.   LMTCB [phone]. [place]  [name]  ?? [phone]   Skype

## 2018-10-28 ENCOUNTER — Ambulatory Visit: Payer: Medicare Other | Admitting: Pharmacist

## 2018-10-28 ENCOUNTER — Other Ambulatory Visit (INDEPENDENT_AMBULATORY_CARE_PROVIDER_SITE_OTHER): Payer: Medicare Other | Admitting: Family Medicine

## 2018-10-28 DIAGNOSIS — I10 Essential (primary) hypertension: Secondary | ICD-10-CM | POA: Diagnosis not present

## 2018-10-28 DIAGNOSIS — E1149 Type 2 diabetes mellitus with other diabetic neurological complication: Secondary | ICD-10-CM | POA: Diagnosis not present

## 2018-10-28 DIAGNOSIS — IMO0002 Reserved for concepts with insufficient information to code with codable children: Secondary | ICD-10-CM

## 2018-10-28 DIAGNOSIS — E1169 Type 2 diabetes mellitus with other specified complication: Secondary | ICD-10-CM | POA: Diagnosis not present

## 2018-10-28 DIAGNOSIS — E1165 Type 2 diabetes mellitus with hyperglycemia: Secondary | ICD-10-CM | POA: Diagnosis not present

## 2018-10-28 DIAGNOSIS — F331 Major depressive disorder, recurrent, moderate: Secondary | ICD-10-CM | POA: Diagnosis not present

## 2018-10-28 DIAGNOSIS — E785 Hyperlipidemia, unspecified: Secondary | ICD-10-CM | POA: Diagnosis not present

## 2018-10-28 MED ORDER — ONETOUCH VERIO VI STRP: ORAL_STRIP | 2 refills | Status: DC

## 2018-10-28 MED ORDER — ONETOUCH ULTRASOFT LANCETS MISC: 2 refills | Status: DC

## 2018-10-28 MED ORDER — ONETOUCH VERIO W/DEVICE KIT: PACK | 0 refills | Status: DC

## 2018-10-28 NOTE — Patient Instructions (Signed)
Thank you allowing the Chronic Care Management Team to be a part of your care! It was a pleasure speaking with you today!     CCM (Chronic Care Management) Team    Janci Minor RN, BSN Nurse Care Coordinator  [phone]   [name] PharmD  Clinical Pharmacist  [phone]   Eula Fried LCSW Clinical Social Worker [phone]  Visit Information  Goals Addressed            This Visit's Progress   . PharmD- I don't have a blood sugar meter (pt-stated)       Current Barriers:  . Financial Barriers . Does not currently have a working Psychologist, forensic Clinical Goal(s):  [name] Over the next 30 days, patient will work with CM Pharmacist to address needs related to obtaining a blood glucose meter, medication adherence and medication regimen optimization  Interventions: . Coordination of care call to [place]. Per representative, [facility] does not stock glucometers, but does stock testing supplies (test strips and lancets. Patient to pick up glucometer through local pharmacy. Testing supplies can be received from [facility] at no charge, typically arriving to patient within 7-10 days. Roney Marion with PCP to request:  o Prescriptions for One Touch Verio (preferred meter through patient's Medicare coverage) meter and supplies be sent to patient's local [facility] for patient to obtain today o Prescriptions for One Touch Verio test strips and One Touch Delica lancets be sent to [facility] for patient to get these supplies for free in the future. . Coordination of care with Loma Sousa, pharmacy technician with [facility] - for glucometer and testing supplies billing . Follow up with Ms. Cuyler to let her know that meter and supplies billed through her Medicare Part B coverage at [facility] and are ready for pick up . Counsel patient to start checking her blood sugar and keeping log . Schedule appointment with patient to complete medication review and follow  up regarding her blood sugar readings on 8/20 . Will mail patient a blood sugar log as requested   Initial goal documentation        The patient verbalized understanding of instructions provided today and declined a print copy of patient instruction materials.   Telephone follow up appointment with care management team member scheduled for:8/20 at 2:30 pm  [name], PharmD, [place] [phone]

## 2018-10-28 NOTE — Chronic Care Management (AMB) (Signed)
  Chronic Care Management   Follow Up Note   10/28/2018 Name: Alyssa Perkins MRN: [id number] DOB: 1950/07/02  Referred by: [name], DO Reason for referral : Chronic Care Management (Initial Patient Outreach Call) and Care Coordination ([facility])   AYNSLEE MULHALL is a 67 y.o. year old female who is a primary care patient of [name], DO. The CCM team was consulted for assistance with chronic disease management and care coordination needs.    Receive referral from [place] for medication adherence and assistance with obtaining new glucometer.  I reached out to [name] by phone today.   Review of patient status, including review of consultants reports, relevant laboratory and other test results, and collaboration with appropriate care team members and the patient's provider was performed as part of comprehensive patient evaluation and provision of chronic care management services.    Goals Addressed            This Visit's Progress   . PharmD- I don't have a blood sugar meter (pt-stated)       Current Barriers:  . Non Adherence to prescribed medication regimen . Financial Barriers . Does not currently have a working Psychologist, forensic Clinical Goal(s):  [name] Over the next 30 days, patient will work with CM Pharmacist to address needs related to obtaining a blood glucose meter, medication adherence and medication regimen optimization  Interventions: . Coordination of care call to [place]. Per representative, [facility] does not stock glucometers, but does stock testing supplies (test strips and lancets. Patient to pick up glucometer through local pharmacy. Testing supplies can be received from [facility] at no charge, typically arriving to patient within 7-10 days. Roney Marion with PCP to request:  o Prescriptions for One Touch Verio (preferred meter through patient's Medicare coverage) meter and supplies be sent  to patient's local [facility] for patient to obtain today o Prescriptions for One Touch Verio test strips and One Touch Delica lancets be sent to [facility] for patient to get these supplies for free in the future. . Coordination of care with Loma Sousa, pharmacy technician with [facility] - for glucometer and testing supplies billing . Follow up with Ms. Bagent to let her know that meter and supplies billed through her Medicare Part B coverage at [facility] and are ready for pick up . Counsel patient to start checking her blood sugar and keeping log . Schedule appointment with patient to complete medication review and follow up regarding her blood sugar readings on 8/20 . Will mail patient a blood sugar log as requested  Patient Self Care Activities:  . Currently UNABLE to independently manage diabetes as evidenced by a recent A1C of 14%  Initial goal documentation        Plan  Telephone follow up appointment with care management team member scheduled for: 8/20 at 2:30 pm The patient has been provided with contact information for the care management team and has been advised to call with any health related questions or concerns.    [name], PharmD, [place] [employer] [phone]

## 2018-10-29 ENCOUNTER — Other Ambulatory Visit: Payer: Self-pay | Admitting: Family Medicine

## 2018-10-29 DIAGNOSIS — IMO0002 Reserved for concepts with insufficient information to code with codable children: Secondary | ICD-10-CM

## 2018-10-29 DIAGNOSIS — E1149 Type 2 diabetes mellitus with other diabetic neurological complication: Secondary | ICD-10-CM

## 2018-10-29 MED ORDER — ONETOUCH DELICA LANCETS 33G MISC: 2 refills | Status: AC

## 2018-10-29 MED ORDER — ONETOUCH VERIO VI STRP: ORAL_STRIP | 2 refills | Status: DC

## 2018-11-04 ENCOUNTER — Other Ambulatory Visit: Payer: Self-pay | Admitting: Family Medicine

## 2018-11-04 DIAGNOSIS — M5412 Radiculopathy, cervical region: Secondary | ICD-10-CM

## 2018-11-04 DIAGNOSIS — G44221 Chronic tension-type headache, intractable: Secondary | ICD-10-CM

## 2018-11-05 ENCOUNTER — Ambulatory Visit: Payer: Medicare Other | Admitting: Pharmacist

## 2018-11-05 DIAGNOSIS — E1149 Type 2 diabetes mellitus with other diabetic neurological complication: Secondary | ICD-10-CM

## 2018-11-05 DIAGNOSIS — E1169 Type 2 diabetes mellitus with other specified complication: Secondary | ICD-10-CM

## 2018-11-05 DIAGNOSIS — E785 Hyperlipidemia, unspecified: Secondary | ICD-10-CM

## 2018-11-05 DIAGNOSIS — I10 Essential (primary) hypertension: Secondary | ICD-10-CM

## 2018-11-05 DIAGNOSIS — F331 Major depressive disorder, recurrent, moderate: Secondary | ICD-10-CM | POA: Diagnosis not present

## 2018-11-05 DIAGNOSIS — G4733 Obstructive sleep apnea (adult) (pediatric): Secondary | ICD-10-CM

## 2018-11-05 DIAGNOSIS — E1165 Type 2 diabetes mellitus with hyperglycemia: Secondary | ICD-10-CM | POA: Diagnosis not present

## 2018-11-05 DIAGNOSIS — IMO0002 Reserved for concepts with insufficient information to code with codable children: Secondary | ICD-10-CM

## 2018-11-05 NOTE — Chronic Care Management (AMB) (Signed)
Chronic Care Management   Note  11/05/2018 Name: Alyssa Perkins MRN: [id number] DOB: 03/28/1950   Subjective:   Alyssa Perkins is Perkins 68 y.o. year old female who is Perkins primary care patient of Alyssa Hauser, DO. The CM team was consulted for assistance with chronic disease management and care coordination. Alyssa Perkins has Perkins past medical history including but not limited to type 2 diabetes mellitus, hypertension, OSA, depression, hyperlipidemia, chronic back pain and acid reflux..  I reached out to Alyssa Perkins by phone today as scheduled to complete medication review  Review of patient status, including review of consultants reports, laboratory and other test data, was performed as part of comprehensive evaluation and provision of chronic care management services.   Objective:  Lab Results  Component Value Date   CREATININE 1.01 (H) 04/14/2018   CREATININE 0.88 09/06/2017   CREATININE 0.98 09/05/2017    Lab Results  Component Value Date   HGBA1C 14.0 (Perkins) 10/21/2018       Component Value Date/Time   CHOL 150 10/15/2016 1126   CHOL 129 02/26/2013 0930   TRIG 257 (H) 10/15/2016 1126   TRIG 242 (H) 02/26/2013 0930   HDL 37 (L) 10/15/2016 1126   HDL 32 (L) 02/26/2013 0930   CHOLHDL 4.1 10/15/2016 1126   VLDL 51 (H) 10/15/2016 1126   VLDL 48 (H) 02/26/2013 0930   LDLCALC 62 10/15/2016 1126   LDLCALC 49 02/26/2013 0930    BP Readings from Last 3 Encounters:  10/21/18 136/65  04/14/18 130/68  04/09/18 120/74    No Known Allergies  Medications Reviewed Today    Reviewed by Alyssa Perkins, RPH (Pharmacist) on 11/05/18 at 1501  Med List Status: <None>  Medication Order Taking? Sig Documenting Provider Last Dose Status Informant  Blood Glucose Monitoring Suppl [facility] VERIO) w/Device KIT [id number]  Use to check blood sugar up to 3 x daily Alyssa Hauser, DO  Active   diclofenac sodium (VOLTAREN) 1 % GEL [id number] No Apply 2 g  topically 3 (three) times daily as needed.  Patient not taking: Reported on 11/05/2018   Alyssa Hauser, DO Not Taking Active   Elastic Bandages & Supports ([place]) Alyssa Perkins [id number]  1 each by Does not apply route daily. Alyssa Axe, NP  Active Self  gabapentin (NEURONTIN) 400 MG capsule [id number] Yes Take 1 capsule (400 mg total) by mouth 3 (three) times daily.  Patient taking differently: Take 400 mg by mouth 2 (two) times daily.    Alyssa Hauser, DO Taking Active   hydrOXYzine (ATARAX/VISTARIL) 25 MG tablet [id number] Yes Take 1 tablet (25 mg total) by mouth daily as needed for itching. For itching Alyssa Hauser, DO Taking Active   ibuprofen (ADVIL,MOTRIN) 200 MG tablet [id number] Yes Take 600-800 mg by mouth every 6 (six) hours as needed for moderate pain. [provider] Taking Active Self  Insulin Glargine, 1 Unit Dial, (TOUJEO SOLOSTAR) 300 UNIT/ML SOPN [id number] Yes Inject 65 Units into the skin daily. Alyssa Hauser, DO Taking Active            Med Note Alyssa Perkins, Alyssa Perkins   Thu Nov 05, 2018  2:52 PM) In morning  Insulin Pen Needle 31G X 5 MM MISC [id number]  1 each by Does not apply route daily. Use to inject Toujeo daily. Dx:E11.9 Alyssa Axe, NP  Active Self  omeprazole (PRILOSEC) 20 MG capsule [id number] Yes Take 1 capsule (20 mg total) by  mouth daily. Alyssa Hauser, DO Taking Active   OneTouch Delica Lancets 16X [place] [id number]  Use to check blood sugar up to 3 x daily Alyssa Hauser, DO  Active   [facility] Perkins VERIO test strip [id number]  Check blood sugar up to 3 x daily Alyssa Hauser, DO  Active   quinapril (ACCUPRIL) 20 MG tablet [id number] Yes Take 1 tablet (20 mg total) by mouth at bedtime. Alyssa Hauser, DO Taking Active   Semaglutide,0.25 or 0.5MG/DOS, (OZEMPIC, 0.25 OR 0.5 MG/DOSE,) 2 MG/1.5ML SOPN [id number] Yes Inject 0.25 mg into the skin once Perkins week. For first  4 weeks. Then increase dose to 0.54m weekly KOlin Hauser DO Taking Active            Med Note ([facility] Alyssa Perkins   Thu Nov 05, 2018  2:45 PM) Taking on Fridays  sertraline (ZOLOFT) 50 MG tablet [id number] Take 1 tablet (50 mg total) by mouth daily. KOlin Hauser DO Taking Active   simvastatin (ZOCOR) 40 MG tablet [id number] Take 1 tablet (40 mg total) by mouth at bedtime. Reported on 05/16/2015  Patient not taking: Reported on 11/05/2018   KOlin Hauser DO Not Taking Active   traMADol (Veatrice Bourbon 50 MG tablet [id number] Take 1 tablet (50 mg total) by mouth daily as needed for moderate pain (headache). KOlin Hauser DO Taking Active   traZODone (DESYREL) 50 MG tablet [id number] Take 1 tablet (50 mg total) by mouth at bedtime. KOlin Hauser DO Taking Active        Patient not taking:      Discontinued 11/05/18 1500 (No longer needed (for PRN medications))            Assessment:   Goals Addressed            This Visit's Progress   . PharmD- I don't have Perkins blood sugar meter (pt-stated)       Current Barriers:  .Marland KitchenKnowledge deficits related to impact of diet on blood sugar and medications to manage blood sugar . Non Adherence to prescribed medication regimen . Financial Barriers . Lack of blood sugar results for clinical team  Pharmacist Clinical Goal(s):  .Marland KitchenOver the next 30 days, patient will work with CM Pharmacist to address needs related to obtaining Perkins blood glucose meter, medication adherence and medication regimen optimization  Interventions: . Comprehensive medication review performed; medication list updated in electronic medical record o Identify that patient has not been taking simvastatin. Patient is able to locate bottle and states that she will restart. o Reports taking sertraline 50 mg once daily, but sometimes taking an additional tablet as needed for mood - Per note from last office visit on 8/5,  PCP advised patient to "RESTART Sertraline 567monce daily, after 4 weeks can increase to one and HALF pill and possibly up to 2 pills - we can discuss on phone" - Look over AVS from visit with patient and advise patient to follow direction from provider o Counsel regarding increased risk of sedation/dizziness with gabapentin, hydroxyzine and tramadol, particularly if taken in combination o Encourage patient to take ibuprofen only as needed and to take with food . Counsel on importance of medication adherence o Encourage patient to start using Perkins weekly pillbox to organize her medications. Reports that her daughter can assist her with filling this weekly o Counsel patient to use alarm on phone for weekly reminder to take [place] Counsel on importance of  blood sugar monitoring and control o Ms. Fouts confirms picking up new glucometer and testing - Denies keeping log of blood sugars, but reports that readings have improved - Reports fasting morning CBG today: 240 mg/dL - Denies any lows o Provide verbal diabetes management and dietary education o Per chart review, patient schedule for appointment with Endocrinologist on 9/3 . Will mail patient educational materials including: "diabetes diet", blood pressure log and "how to measure your blood pressure" . Encourage patient to occasionally check blood pressure and keep log o Patient reports having an upper arm monitor . Encourage patient to call provider to schedule follow up appointment o Planned 4 week follow up telephone visit o Reports not currently using CPAP (unable to tolerate current mask/machine) - Patient wonders if she needs Perkins new referral for evaluation . Counsel patient about importance of calling PCP regarding new medical questions/concerns  Patient Self Care Activities:  . Currently UNABLE to independently manage diabetes as evidenced by Perkins recent A1C of 14%  Please see past updates related to this goal by clicking on the "Past  Updates" button in the selected goal         Plan:  Telephone follow up appointment with care management team member scheduled for: 8/28 at 1 pm  [name], PharmD, [place] [phone]

## 2018-11-05 NOTE — Patient Instructions (Signed)
Thank you allowing the Chronic Care Management Team to be a part of your care! It was a pleasure speaking with you today!     CCM (Chronic Care Management) Team    Janci Minor RN, BSN Nurse Care Coordinator  [phone]   [name] PharmD  Clinical Pharmacist  [phone]   Eula Fried LCSW Clinical Social Worker [phone]  Visit Information  Goals Addressed            This Visit's Progress   . PharmD- I don't have a blood sugar meter (pt-stated)       Current Barriers:  [name] Knowledge deficits related to impact of diet on blood sugar and medications to manage blood sugar . Non Adherence to prescribed medication regimen . Financial Barriers . Lack of blood sugar results for clinical team  Pharmacist Clinical Goal(s):  [name] Over the next 30 days, patient will work with CM Pharmacist to address needs related to obtaining a blood glucose meter, medication adherence and medication regimen optimization  Interventions: . Comprehensive medication review performed; medication list updated in electronic medical record o Identify that patient has not been taking simvastatin. Patient is able to locate bottle and states that she will restart. o Reports taking sertraline 50 mg once daily, but sometimes taking an additional tablet as needed for mood - Per note from last office visit on 8/5, PCP advised patient to "RESTART Sertraline 50mg  once daily, after 4 weeks can increase to one and HALF pill and possibly up to 2 pills - we can discuss on phone" - Look over AVS from visit with patient and advise patient to follow direction from provider o Counsel regarding increased risk of sedation/dizziness with gabapentin, hydroxyzine and tramadol, particularly if taken in combination o Encourage patient to take ibuprofen only as needed and to take with food . Counsel on importance of medication adherence o Encourage patient to start using a weekly pillbox to organize her medications.  Reports that her daughter can assist her with filling this weekly o Counsel patient to use alarm on phone for weekly reminder to take [place] . Counsel on importance of blood sugar monitoring and control o Ms. Mcerlean confirms picking up new glucometer and testing - Denies keeping log of blood sugars, but reports that readings have improved - Reports fasting morning CBG today: 240 mg/dL - Denies any lows o Provide verbal diabetes management and dietary education o Per chart review, patient schedule for appointment with Endocrinologist on 9/3 . Will mail patient educational materials including: "diabetes diet", blood pressure log and "how to measure your blood pressure" . Encourage patient to occasionally check blood pressure and keep log o Patient reports having an upper arm monitor . Encourage patient to call provider to schedule follow up appointment o Planned 4 week follow up telephone visit o Reports not currently using CPAP (unable to tolerate current mask/machine) - Patient wonders if she needs a new referral for evaluation . Counsel patient about importance of calling PCP regarding new medical questions/concerns  Patient Self Care Activities:  . Currently UNABLE to independently manage diabetes as evidenced by a recent A1C of 14%  Please see past updates related to this goal by clicking on the "Past Updates" button in the selected goal         The patient verbalized understanding of instructions provided today and declined a print copy of patient instruction materials.   Telephone follow up appointment with care management team member scheduled for: 8/28 at 1 pm  [name], PharmD, [place] [employer] [phone]

## 2018-11-09 ENCOUNTER — Ambulatory Visit: Payer: Medicare Other | Admitting: *Deleted

## 2018-11-09 DIAGNOSIS — E1149 Type 2 diabetes mellitus with other diabetic neurological complication: Secondary | ICD-10-CM | POA: Diagnosis not present

## 2018-11-09 DIAGNOSIS — E1165 Type 2 diabetes mellitus with hyperglycemia: Secondary | ICD-10-CM | POA: Diagnosis not present

## 2018-11-09 DIAGNOSIS — E785 Hyperlipidemia, unspecified: Secondary | ICD-10-CM | POA: Diagnosis not present

## 2018-11-09 DIAGNOSIS — I10 Essential (primary) hypertension: Secondary | ICD-10-CM | POA: Diagnosis not present

## 2018-11-09 DIAGNOSIS — E1169 Type 2 diabetes mellitus with other specified complication: Secondary | ICD-10-CM | POA: Diagnosis not present

## 2018-11-09 DIAGNOSIS — IMO0002 Reserved for concepts with insufficient information to code with codable children: Secondary | ICD-10-CM

## 2018-11-09 DIAGNOSIS — F331 Major depressive disorder, recurrent, moderate: Secondary | ICD-10-CM | POA: Diagnosis not present

## 2018-11-09 NOTE — Chronic Care Management (AMB) (Signed)
Chronic Care Management   Follow Up Note   11/09/2018 Name: Alyssa Perkins MRN: [id number] DOB: 1950-04-20  Referred by: [name], DO Reason for referral : Chronic Care Management (Diabetes Managment )   Alyssa Perkins is a 68 y.o. year old female who is a primary care patient of [name], DO. The CCM team was consulted for assistance with chronic disease management and care coordination needs.    Review of patient status, including review of consultants reports, relevant laboratory and other test results, and collaboration with appropriate care team members and the patient's provider was performed as part of comprehensive patient evaluation and provision of chronic care management services.    SDOH (Social Determinants of Health) screening performed today: Financial Strain  Stress Physical Activity. See Care Plan for related entries.   Outpatient Encounter Medications as of 11/09/2018  Medication Sig Note  . Blood Glucose Monitoring Suppl (ONETOUCH VERIO) w/Device KIT Use to check blood sugar up to 3 x daily   . diclofenac sodium (VOLTAREN) 1 % GEL Apply 2 g topically 3 (three) times daily as needed. (Patient not taking: Reported on 11/05/2018)   . Elastic Bandages & Supports (MEDICAL COMPRESSION STOCKINGS) MISC 1 each by Does not apply route daily.   [name] gabapentin (NEURONTIN) 400 MG capsule Take 1 capsule (400 mg total) by mouth 3 (three) times daily. (Patient taking differently: Take 400 mg by mouth 2 (two) times daily. )   . hydrOXYzine (ATARAX/VISTARIL) 25 MG tablet Take 1 tablet (25 mg total) by mouth daily as needed for itching. For itching   . ibuprofen (ADVIL,MOTRIN) 200 MG tablet Take 600-800 mg by mouth every 6 (six) hours as needed for moderate pain.   . Insulin Glargine, 1 Unit Dial, (TOUJEO SOLOSTAR) 300 UNIT/ML SOPN Inject 65 Units into the skin daily. 11/05/2018: In morning  . Insulin Pen Needle 31G X 5 MM MISC 1 each by Does not apply  route daily. Use to inject Toujeo daily. Dx:E11.9   . omeprazole (PRILOSEC) 20 MG capsule Take 1 capsule (20 mg total) by mouth daily.   [name] Lancets 64P MISC Use to check blood sugar up to 3 x daily   . ONETOUCH VERIO test strip Check blood sugar up to 3 x daily   . quinapril (ACCUPRIL) 20 MG tablet Take 1 tablet (20 mg total) by mouth at bedtime.   . Semaglutide,0.25 or 0.5MG/DOS, (OZEMPIC, 0.25 OR 0.5 MG/DOSE,) 2 MG/1.5ML SOPN Inject 0.25 mg into the skin once a week. For first 4 weeks. Then increase dose to 0.69m weekly 11/05/2018: Taking on Fridays  . sertraline (ZOLOFT) 50 MG tablet Take 1 tablet (50 mg total) by mouth daily.   . simvastatin (ZOCOR) 40 MG tablet Take 1 tablet (40 mg total) by mouth at bedtime. Reported on 05/16/2015 (Patient not taking: Reported on 11/05/2018)   . traMADol (ULTRAM) 50 MG tablet Take 1 tablet (50 mg total) by mouth daily as needed for moderate pain (headache).   . traZODone (DESYREL) 50 MG tablet Take 1 tablet (50 mg total) by mouth at bedtime.    No facility-administered encounter medications on file as of 11/09/2018.      Goals Addressed            This Visit's Progress   . RNCM- I need help with my diabetes (pt-stated)       Current Barriers:  .Marland KitchenKnowledge Deficits related to basic Diabetes pathophysiology and self care/management . Knowledge Deficits related  to medications used for management of diabetes . Difficulty obtaining or cannot afford medications . Does not have glucometer to monitor blood sugar . Film/video editor . Transportation barriers  Case Manager Clinical Goal(s):  Over the next 120 days, patient will demonstrate improved adherence to prescribed treatment plan for diabetes self care/management as evidenced by:  . daily monitoring and recording of CBG  . adherence to ADA/ carb modified diet . adherence to prescribed medication regimen  Interventions:  . Provided education to patient about basic DM disease  process . Reviewed medications with patient and discussed importance of medication adherence . Discussed plans with patient for ongoing care management follow up and provided patient with direct contact information for care management team . Patient discussed she was not feeling well since starting back one of the medications she had previously discontinued, but could not recall the name "it's one for my blood pressure". Med review done patient did not recognize the name and did not feel like getting up to get her bottle. Patient stated she was having headache, nausea, and diarrhea, I did mention to patient these were some of the symptoms of Covid-19 and if she had been exposed or continued to not feel well she needed to call her PCP.  [name] Patient reports she has been checking b/p and today it was 118/80 which she felt was low but I assured her it was WNL.  . Patient reports good adherence with her Ozempic, she stated this Friday will be her 3rd or 4th dose.  . Patient reports checking her sugars with her new meter but it was in the other room and she did not feel like getting up to get it. She stated she had not had any lows, and had noticed her sugars trending down.  . Patient was able to verbalize correct daily insulin dose and reports be adherent to taking med as prescribed.  . Discussed with patient obtaining an Endocrinologist closer to home related to transportation barriers- Patient reports appt 9/3 confirms her daughter will be able to drive her there . Patient stated she has not started using a weekly pill organizer yet, because she has not been able to locate hers. Offered to mail her one. Patient accepting of this.  Nash Dimmer with CCM Pharmacist, made her aware of patient's symptoms with the restart of one of patient's medications. Pharmacist has plans to reach out to patient this week.   Patient Self Care Activities:  . UNABLE to independently manage diabetes as evidenced by a recent A1C  of 14% on 10/21/18  Please see past updates related to this goal by clicking on the "Past Updates" button in the selected goal           The care management team will reach out to the patient again over the next 30 days.  The patient has been provided with contact information for the care management team and has been advised to call with any health related questions or concerns.    Merlene Morse Melchor Kirchgessner RN, BSN Nurse Case Pharmacist, [facility]  [phone]) Business Mobile

## 2018-11-09 NOTE — Patient Instructions (Signed)
Thank you allowing the Chronic Care Management Team to be a part of your care! It was a pleasure speaking with you today!  CCM (Chronic Care Management) Team   Letty Salvi RN, BSN Nurse Care Coordinator  [phone]  [name] PharmD  Clinical Pharmacist  [phone]  Eula Fried LCSW Clinical Social Worker [phone]  Goals Addressed            This Visit's Progress   . RNCM- I need help with my diabetes (pt-stated)       Current Barriers:  [name] Knowledge Deficits related to basic Diabetes pathophysiology and self care/management . Knowledge Deficits related to medications used for management of diabetes . Difficulty obtaining or cannot afford medications . Does not have glucometer to monitor blood sugar . Film/video editor . Transportation barriers  Case Manager Clinical Goal(s):  Over the next 120 days, patient will demonstrate improved adherence to prescribed treatment plan for diabetes self care/management as evidenced by:  . daily monitoring and recording of CBG  . adherence to ADA/ carb modified diet . adherence to prescribed medication regimen  Interventions:  . Provided education to patient about basic DM disease process . Reviewed medications with patient and discussed importance of medication adherence . Discussed plans with patient for ongoing care management follow up and provided patient with direct contact information for care management team . Patient discussed she was not feeling well since starting back one of the medications she had previously discontinued, but could not recall the name "it's one for my blood pressure". Med review done patient did not recognize the name and did not feel like getting up to get her bottle. Patient stated she was having headache, nausea, and diarrhea, I did mention to patient these were some of the symptoms of Covid-19 and if she had been exposed or continued to not feel well she needed to call her PCP.   [name] Patient reports she has been checking b/p and today it was 118/80 which she felt was low but I assured her it was WNL.  . Patient reports good adherence with her Ozempic, she stated this Friday will be her 3rd or 4th dose.  . Patient reports checking her sugars with her new meter but it was in the other room and she did not feel like getting up to get it. She stated she had not had any lows, and had noticed her sugars trending down.  . Patient was able to verbalize correct daily insulin dose and reports be adherent to taking med as prescribed.  . Discussed with patient obtaining an Endocrinologist closer to home related to transportation barriers- Patient reports appt 9/3 confirms her daughter will be able to drive her there . Patient stated she has not started using a weekly pill organizer yet, because she has not been able to locate hers. Offered to mail her one. Patient accepting of this.  Nash Dimmer with CCM Pharmacist, made her aware of patient's symptoms with the restart of one of patient's medications. Pharmacist has plans to reach out to patient this week.   Patient Self Care Activities:  . UNABLE to independently manage diabetes as evidenced by a recent A1C of 14% on 10/21/18  Please see past updates related to this goal by clicking on the "Past Updates" button in the selected goal          The patient verbalized understanding of instructions provided today and declined a print copy of patient instruction materials.   The patient  has been provided with contact information for the care management team and has been advised to call with any health related questions or concerns.

## 2018-11-12 DIAGNOSIS — Z20828 Contact with and (suspected) exposure to other viral communicable diseases: Secondary | ICD-10-CM | POA: Diagnosis not present

## 2018-11-13 ENCOUNTER — Ambulatory Visit: Payer: Medicare Other | Admitting: Pharmacist

## 2018-11-13 DIAGNOSIS — E1169 Type 2 diabetes mellitus with other specified complication: Secondary | ICD-10-CM | POA: Diagnosis not present

## 2018-11-13 DIAGNOSIS — I10 Essential (primary) hypertension: Secondary | ICD-10-CM | POA: Diagnosis not present

## 2018-11-13 DIAGNOSIS — E785 Hyperlipidemia, unspecified: Secondary | ICD-10-CM | POA: Diagnosis not present

## 2018-11-13 DIAGNOSIS — E1149 Type 2 diabetes mellitus with other diabetic neurological complication: Secondary | ICD-10-CM | POA: Diagnosis not present

## 2018-11-13 DIAGNOSIS — E1165 Type 2 diabetes mellitus with hyperglycemia: Secondary | ICD-10-CM | POA: Diagnosis not present

## 2018-11-13 DIAGNOSIS — F331 Major depressive disorder, recurrent, moderate: Secondary | ICD-10-CM | POA: Diagnosis not present

## 2018-11-13 DIAGNOSIS — IMO0002 Reserved for concepts with insufficient information to code with codable children: Secondary | ICD-10-CM

## 2018-11-13 NOTE — Chronic Care Management (AMB) (Signed)
Chronic Care Management   Follow Up Note   11/13/2018 Name: Alyssa Perkins MRN: [id number] DOB: 1950/09/23  Referred by: Alyssa Hauser, DO Reason for referral : Chronic Care Management (Patient Phone Call)   Alyssa Perkins is a 68 y.o. year old female who is a primary care patient of Alyssa Hauser, DO. The CCM team was consulted for assistance with chronic disease management and care coordination needs.  Alyssa Perkins has a past medical history including but not limited to type 2 diabetes mellitus, hypertension, OSA, depression, hyperlipidemia, chronic back pain and acid reflux..  I reached out to [name] by phone today.   Review of patient status, including review of consultants reports, relevant laboratory and other test results, and collaboration with appropriate care team members and the patient's provider was performed as part of comprehensive patient evaluation and provision of chronic care management services.     Outpatient Encounter Medications as of 11/13/2018  Medication Sig Note  . Semaglutide,0.25 or 0.5MG/DOS, (OZEMPIC, 0.25 OR 0.5 MG/DOSE,) 2 MG/1.5ML SOPN Inject 0.25 mg into the skin once a week. For first 4 weeks. Then increase dose to 0.21m weekly 11/05/2018: Taking on Fridays  . Blood Glucose Monitoring Suppl (ONETOUCH VERIO) w/Device KIT Use to check blood sugar up to 3 x daily   . diclofenac sodium (VOLTAREN) 1 % GEL Apply 2 g topically 3 (three) times daily as needed. (Patient not taking: Reported on 11/05/2018)   . Elastic Bandages & Supports (MEDICAL COMPRESSION STOCKINGS) MISC 1 each by Does not apply route daily.   .Marland Kitchengabapentin (NEURONTIN) 400 MG capsule Take 1 capsule (400 mg total) by mouth 3 (three) times daily. (Patient taking differently: Take 400 mg by mouth 2 (two) times daily. )   . hydrOXYzine (ATARAX/VISTARIL) 25 MG tablet Take 1 tablet (25 mg total) by mouth daily as needed for itching. For itching   . ibuprofen  (ADVIL,MOTRIN) 200 MG tablet Take 600-800 mg by mouth every 6 (six) hours as needed for moderate pain.   . Insulin Glargine, 1 Unit Dial, (TOUJEO SOLOSTAR) 300 UNIT/ML SOPN Inject 65 Units into the skin daily. 11/05/2018: In morning  . Insulin Pen Needle 31G X 5 MM MISC 1 each by Does not apply route daily. Use to inject Toujeo daily. Dx:E11.9   . omeprazole (PRILOSEC) 20 MG capsule Take 1 capsule (20 mg total) by mouth daily.   .Glory RosebushDelica Lancets 303OMISC Use to check blood sugar up to 3 x daily   . ONETOUCH VERIO test strip Check blood sugar up to 3 x daily   . quinapril (ACCUPRIL) 20 MG tablet Take 1 tablet (20 mg total) by mouth at bedtime.   . sertraline (ZOLOFT) 50 MG tablet Take 1 tablet (50 mg total) by mouth daily.   . simvastatin (ZOCOR) 40 MG tablet Take 1 tablet (40 mg total) by mouth at bedtime. Reported on 05/16/2015 (Patient not taking: Reported on 11/05/2018)   . traMADol (ULTRAM) 50 MG tablet Take 1 tablet (50 mg total) by mouth daily as needed for moderate pain (headache).   . traZODone (DESYREL) 50 MG tablet Take 1 tablet (50 mg total) by mouth at bedtime.    No facility-administered encounter medications on file as of 11/13/2018.     Goals Addressed            This Visit's Progress   . PharmD- I don't have a blood sugar meter (pt-stated)       Current Barriers:  .  Knowledge deficits related to impact of diet on blood sugar and medications to manage blood sugar . Non Adherence to prescribed medication regimen . Financial Barriers . Lack of blood sugar results for clinical team  Pharmacist Clinical Goal(s):  [name] Over the next 30 days, patient will work with CM Pharmacist to address needs related to obtaining a blood glucose meter, medication adherence and medication regimen optimization  Interventions: . Follow up regarding adherence to simvastatin o Identified during previous call with patient that she had misplaced her simvastatin and was no longer taking it. o  Alyssa Perkins reports that she restarted taking simvastatin 40 mg QHS last Saturday, but that she then stopped due to having nausea and diarrhea - Reports that as the symptoms have continued, she believes that she has an illness, rather than a medication side effect . Reports that she is feeling better today - Reports [facility], result pending . Counsel on importance of medication adherence o Denies starting using weekly pillbox as she was unable to locate hers o Coordination of care call with CM Nurse Case Manager - will mail patient a weekly pillbox o Counsel patient to use alarm on phone for weekly reminder to take [place] . Counsel on importance of blood sugar monitoring and control o Alyssa Perkins confirms receiving blood sugar log, but denies keeping log of blood sugars as she has been sick - Reports morning CBG during breakfast today: ~200 mg/dL o Provide verbal diabetes management and dietary education o Per chart review, patient schedule for appointment with Endocrinologist on 9/3 . Encourage patient to occasionally check blood pressure and keep log o Patient reports having an upper arm monitor . Encourage patient to call provider to schedule follow up appointment o Planned 4 week follow up telephone visit o Reports not currently using CPAP (unable to tolerate current mask/machine) - Patient wonders if she needs a new referral for evaluation . Counsel patient again about importance of calling PCP regarding new medical questions/concerns  Patient Self Care Activities:  . Currently UNABLE to independently manage diabetes as evidenced by a recent A1C of 14%  Please see past updates related to this goal by clicking on the "Past Updates" button in the selected goal         Plan  Ms. Hodder to call office to schedule follow up PCP appointment Telephone follow up appointment with care management team member scheduled for: 9/11 at 1 pm   Alyssa Perkins, PharmD, [place] [phone]

## 2018-11-13 NOTE — Patient Instructions (Signed)
Thank you allowing the Chronic Care Management Team to be a part of your care! It was a pleasure speaking with you today!     CCM (Chronic Care Management) Team    Janci Minor RN, BSN Nurse Care Coordinator  [phone]   [name] PharmD  Clinical Pharmacist  [phone]   Eula Fried LCSW Clinical Social Worker [phone]  Visit Information  Goals Addressed            This Visit's Progress   . PharmD- I don't have a blood sugar meter (pt-stated)       Current Barriers:  [name] Knowledge deficits related to impact of diet on blood sugar and medications to manage blood sugar . Non Adherence to prescribed medication regimen . Financial Barriers . Lack of blood sugar results for clinical team  Pharmacist Clinical Goal(s):  [name] Over the next 30 days, patient will work with CM Pharmacist to address needs related to obtaining a blood glucose meter, medication adherence and medication regimen optimization  Interventions: . Follow up regarding adherence to simvastatin o Identified during previous call with patient that she had misplaced her simvastatin and was no longer taking it. o Ms. Downard reports that she restarted taking simvastatin 40 mg QHS last Saturday, but that she then stopped due to having nausea and diarrhea - Reports that as the symptoms have continued, she believes that she has an illness, rather than a medication side effect . Reports that she is feeling better today - [facility], result pending . Counsel on importance of medication adherence o Denies starting using weekly pillbox as she was unable to locate hers o Coordination of care call with CM Nurse Case Manager - will mail patient a weekly pillbox o Counsel patient to use alarm on phone for weekly reminder to take [place] . Counsel on importance of blood sugar monitoring and control o Ms. Jewell confirms receiving blood sugar log, but denies  keeping log of blood sugars as she has been sick - Reports morning CBG during breakfast today: ~200 mg/dL o Provide verbal diabetes management and dietary education o Per chart review, patient schedule for appointment with Endocrinologist on 9/3 . Encourage patient to occasionally check blood pressure and keep log o Patient reports having an upper arm monitor . Encourage patient to call provider to schedule follow up appointment o Planned 4 week follow up telephone visit o Reports not currently using CPAP (unable to tolerate current mask/machine) - Patient wonders if she needs a new referral for evaluation . Counsel patient again about importance of calling PCP regarding new medical questions/concerns  Patient Self Care Activities:  . Currently UNABLE to independently manage diabetes as evidenced by a recent A1C of 14%  Please see past updates related to this goal by clicking on the "Past Updates" button in the selected goal         The patient verbalized understanding of instructions provided today and declined a print copy of patient instruction materials.   Telephone follow up appointment with care management team member scheduled for: 9/11 at 1 pm  [name], PharmD, [place] [phone]

## 2018-11-19 DIAGNOSIS — Z794 Long term (current) use of insulin: Secondary | ICD-10-CM | POA: Diagnosis not present

## 2018-11-19 DIAGNOSIS — I10 Essential (primary) hypertension: Secondary | ICD-10-CM | POA: Diagnosis not present

## 2018-11-19 DIAGNOSIS — E785 Hyperlipidemia, unspecified: Secondary | ICD-10-CM | POA: Diagnosis not present

## 2018-11-19 DIAGNOSIS — E1169 Type 2 diabetes mellitus with other specified complication: Secondary | ICD-10-CM | POA: Diagnosis not present

## 2018-11-19 DIAGNOSIS — E1165 Type 2 diabetes mellitus with hyperglycemia: Secondary | ICD-10-CM | POA: Diagnosis not present

## 2018-11-19 DIAGNOSIS — E669 Obesity, unspecified: Secondary | ICD-10-CM | POA: Diagnosis not present

## 2018-11-19 DIAGNOSIS — E1142 Type 2 diabetes mellitus with diabetic polyneuropathy: Secondary | ICD-10-CM | POA: Diagnosis not present

## 2018-11-19 DIAGNOSIS — E1159 Type 2 diabetes mellitus with other circulatory complications: Secondary | ICD-10-CM | POA: Diagnosis not present

## 2018-11-27 ENCOUNTER — Ambulatory Visit (INDEPENDENT_AMBULATORY_CARE_PROVIDER_SITE_OTHER): Payer: Medicare Other | Admitting: Pharmacist

## 2018-11-27 DIAGNOSIS — E1149 Type 2 diabetes mellitus with other diabetic neurological complication: Secondary | ICD-10-CM | POA: Diagnosis not present

## 2018-11-27 DIAGNOSIS — IMO0002 Reserved for concepts with insufficient information to code with codable children: Secondary | ICD-10-CM

## 2018-11-27 DIAGNOSIS — I10 Essential (primary) hypertension: Secondary | ICD-10-CM | POA: Diagnosis not present

## 2018-11-27 DIAGNOSIS — E1165 Type 2 diabetes mellitus with hyperglycemia: Secondary | ICD-10-CM

## 2018-11-27 NOTE — Patient Instructions (Signed)
Thank you allowing the Chronic Care Management Team to be a part of your care! It was a pleasure speaking with you today!     CCM (Chronic Care Management) Team    Janci Minor RN, BSN Nurse Care Coordinator  [phone]   [name] PharmD  Clinical Pharmacist  [phone]   Eula Fried LCSW Clinical Social Worker [phone]  Visit Information  Goals Addressed            This Visit's Progress   . PharmD- I don't have a blood sugar meter (pt-stated)       Current Barriers:  [name] Knowledge deficits related to impact of diet on blood sugar and medications to manage blood sugar . Non Adherence to prescribed medication regimen . Financial Barriers . Lack of blood sugar results for clinical team  Pharmacist Clinical Goal(s):  [name] Over the next 30 days, patient will work with CM Pharmacist to address needs related to obtaining a blood glucose meter, medication adherence and medication regimen optimization  Interventions: . Follow up regarding COVID-19 testing result o Reports [facility] o Reports that she has been feeling better, but still has occasional nausea and diarrhea o Again encourage patient to schedule follow up visit with PCP . Counsel on importance of medication adherence o Confirms receiving weekly pillbox and states that she will fill and start to use this tool today . Counsel on importance of blood sugar monitoring and control o Review CBG results from past week (see below) o Counsel on using blood sugar log to keep record o Provide verbal diabetes management and dietary education o Per chart review, patient kept appointment with Endocrinologist on 9/3 - Patient instructed to:  [name] Continue Ozempic 0.25 mg for 2 more doses, then increase to 0.5 mg weekly on Friday . Continue Toujeo 65 units each morning - Advise patient to contact endocrinologist office to request her refills of Ozempic and Toujeo be sent to  [place] . Encourage patient to occasionally check blood pressure and keep log o Patient reports having an upper arm monitor . Counsel patient again about importance of calling PCP regarding new medical questions/concerns  Patient Self Care Activities:  . Currently UNABLE to independently manage diabetes as evidenced by a recent A1C of 14% . Patient to check blood sugars as directed and keep log Date Fasting Blood Glucose After Breakfast Bedtime Notes  5 - September 138  165   6 - September 177  157   7 - September  207 235* *Ate cereal before going to bed  8 - September  219    9 - September  211    10 - September  310*  *Had [place] crackers  11 - September 202        Please see past updates related to this goal by clicking on the "Past Updates" button in the selected goal         The patient verbalized understanding of instructions provided today and declined a print copy of patient instruction materials.   Telephone follow up appointment with care management team member scheduled for: 9/25 at 2 pm  [name], PharmD, [place] [phone]

## 2018-11-27 NOTE — Chronic Care Management (AMB) (Signed)
Chronic Care Management   Follow Up Note   11/27/2018 Name: Alyssa Perkins MRN: [id number] DOB: 1950-06-28  Referred by: [name], DO Reason for referral : Chronic Care Management (Patient Phone Call)   Alyssa Perkins is a 68 y.o. year old female who is a primary care patient of [name], DO. The CCM team was consulted for assistance with chronic disease management and care coordination needs.  Alyssa Perkins has a past medical history including but not limited to type 2 diabetes mellitus, hypertension, OSA, depression, hyperlipidemia, chronic back pain and acid reflux..  I reached out to Alyssa Perkins by phone today.   Review of patient status, including review of consultants reports, relevant laboratory and other test results, and collaboration with appropriate care team members and the patient's provider was performed as part of comprehensive patient evaluation and provision of chronic care management services.     Outpatient Encounter Medications as of 11/27/2018  Medication Sig Note  . Insulin Glargine, 1 Unit Dial, (TOUJEO SOLOSTAR) 300 UNIT/ML SOPN Inject 65 Units into the skin daily. 11/05/2018: In morning  . Semaglutide,0.25 or 0.'5MG'$ /DOS, (OZEMPIC, 0.25 OR 0.5 MG/DOSE,) 2 MG/1.5ML SOPN Inject 0.25 mg into the skin once a week. For first 4 weeks. Then increase dose to 0.'5mg'$  weekly 11/05/2018: Taking on Fridays  . Blood Glucose Monitoring Suppl (ONETOUCH VERIO) w/Device KIT Use to check blood sugar up to 3 x daily   . diclofenac sodium (VOLTAREN) 1 % GEL Apply 2 g topically 3 (three) times daily as needed. (Patient not taking: Reported on 11/05/2018)   . Elastic Bandages & Supports (MEDICAL COMPRESSION STOCKINGS) MISC 1 each by Does not apply route daily.   [name] gabapentin (NEURONTIN) 400 MG capsule Take 1 capsule (400 mg total) by mouth 3 (three) times daily. (Patient taking differently: Take 400 mg by mouth 2 (two) times daily. )   .  hydrOXYzine (ATARAX/VISTARIL) 25 MG tablet Take 1 tablet (25 mg total) by mouth daily as needed for itching. For itching   . ibuprofen (ADVIL,MOTRIN) 200 MG tablet Take 600-800 mg by mouth every 6 (six) hours as needed for moderate pain.   . Insulin Pen Needle 31G X 5 MM MISC 1 each by Does not apply route daily. Use to inject Toujeo daily. Dx:E11.9   . omeprazole (PRILOSEC) 20 MG capsule Take 1 capsule (20 mg total) by mouth daily.   [name] Lancets 33I MISC Use to check blood sugar up to 3 x daily   . ONETOUCH VERIO test strip Check blood sugar up to 3 x daily   . quinapril (ACCUPRIL) 20 MG tablet Take 1 tablet (20 mg total) by mouth at bedtime.   . sertraline (ZOLOFT) 50 MG tablet Take 1 tablet (50 mg total) by mouth daily.   . simvastatin (ZOCOR) 40 MG tablet Take 1 tablet (40 mg total) by mouth at bedtime. Reported on 05/16/2015 (Patient not taking: Reported on 11/05/2018)   . traMADol (ULTRAM) 50 MG tablet Take 1 tablet (50 mg total) by mouth daily as needed for moderate pain (headache).   . traZODone (DESYREL) 50 MG tablet Take 1 tablet (50 mg total) by mouth at bedtime.    No facility-administered encounter medications on file as of 11/27/2018.     Goals Addressed            This Visit's Progress   . PharmD- I don't have a blood sugar meter (pt-stated)       Current Barriers:  .  Knowledge deficits related to impact of diet on blood sugar and medications to manage blood sugar . Non Adherence to prescribed medication regimen . Financial Barriers . Lack of blood sugar results for clinical team  Pharmacist Clinical Goal(s):  [name] Over the next 30 days, patient will work with CM Pharmacist to address needs related to obtaining a blood glucose meter, medication adherence and medication regimen optimization  Interventions: . Follow up regarding COVID-19 testing result o Reports [facility] o Reports that she has been feeling better, but  still has occasional nausea and diarrhea o Again encourage patient to schedule follow up visit with PCP - Patient states that she does not want to schedule this appointment until she has her car back, rather than having family transport her . Counsel on importance of medication adherence o Confirms receiving weekly pillbox and states that she will fill and start to use this tool today . Counsel on importance of blood sugar monitoring and control o Review CBG results from past week (see below) o Counsel on using blood sugar log to keep record o Provide verbal diabetes management and dietary education o Per chart review, patient kept appointment with Endocrinologist on 9/3 - Patient instructed to:  [name] Continue Ozempic 0.25 mg for 2 more doses, then increase to 0.5 mg weekly on Friday . Continue Toujeo 65 units each morning - Advise patient to contact endocrinologist office to request her refills of Ozempic and Toujeo be sent to [place] . Encourage patient to occasionally check blood pressure and keep log o Patient reports having an upper arm monitor . Counsel patient again about importance of calling PCP regarding new medical questions/concerns  Patient Self Care Activities:  . Currently UNABLE to independently manage diabetes as evidenced by a recent A1C of 14% . Patient to check blood sugars as directed and keep log Date Fasting Blood Glucose After Breakfast Bedtime Notes  5 - September 138  165   6 - September 177  157   7 - September  207 235* *Ate cereal before going to bed  8 - September  219    9 - September  211    10 - September  310*  *Had [place] crackers  11 - September 202        Please see past updates related to this goal by clicking on the "Past Updates" button in the selected goal         Plan  Telephone follow up appointment with care management team member scheduled for: 9/25 at 2 pm  [name], PharmD, [place] [phone]

## 2018-12-11 ENCOUNTER — Telehealth: Payer: Self-pay

## 2018-12-11 ENCOUNTER — Ambulatory Visit: Payer: Self-pay | Admitting: Pharmacist

## 2018-12-11 DIAGNOSIS — E1149 Type 2 diabetes mellitus with other diabetic neurological complication: Secondary | ICD-10-CM

## 2018-12-11 DIAGNOSIS — I10 Essential (primary) hypertension: Secondary | ICD-10-CM

## 2018-12-11 DIAGNOSIS — IMO0002 Reserved for concepts with insufficient information to code with codable children: Secondary | ICD-10-CM

## 2018-12-11 NOTE — Chronic Care Management (AMB) (Signed)
  Chronic Care Management   Follow Up Note   12/11/2018 Name: Alyssa Perkins MRN: [id number] DOB: June 29, 1950  Referred by: [name], DO Reason for referral : Chronic Care Management (Patient Phone Call)   Alyssa Perkins is a 68 y.o. year old female who is a primary care patient of [name], DO. The CCM team was consulted for assistance with chronic disease management and care coordination needs.    Was unable to reach patient via telephone today and unable to leave a message as no voicemail picks up.   Plan  The care management team will reach out to the patient again over the next 14 days.   [name], PharmD, [place] [employer] [phone]

## 2018-12-21 ENCOUNTER — Telehealth: Payer: Self-pay

## 2018-12-25 ENCOUNTER — Ambulatory Visit: Payer: Medicare Other | Admitting: Pharmacist

## 2018-12-25 ENCOUNTER — Other Ambulatory Visit: Payer: Self-pay | Admitting: Family Medicine

## 2018-12-25 DIAGNOSIS — IMO0002 Reserved for concepts with insufficient information to code with codable children: Secondary | ICD-10-CM

## 2018-12-25 DIAGNOSIS — Z1231 Encounter for screening mammogram for malignant neoplasm of breast: Secondary | ICD-10-CM

## 2018-12-25 DIAGNOSIS — I10 Essential (primary) hypertension: Secondary | ICD-10-CM

## 2018-12-25 DIAGNOSIS — E1149 Type 2 diabetes mellitus with other diabetic neurological complication: Secondary | ICD-10-CM

## 2018-12-25 DIAGNOSIS — E1140 Type 2 diabetes mellitus with diabetic neuropathy, unspecified: Secondary | ICD-10-CM

## 2018-12-25 MED ORDER — OZEMPIC (0.25 OR 0.5 MG/DOSE) 2 MG/1.5ML ~~LOC~~ SOPN: 0.2500 mg | PEN_INJECTOR | SUBCUTANEOUS | 0 refills | Status: DC

## 2018-12-25 MED ORDER — TOUJEO SOLOSTAR 300 UNIT/ML ~~LOC~~ SOPN: 65.0000 [IU] | PEN_INJECTOR | Freq: Every day | SUBCUTANEOUS | 1 refills | Status: DC

## 2018-12-25 NOTE — Telephone Encounter (Signed)
Needs refills called in to [place]? She is out of medication  OZEMPIC, .25 OR 0.5 MG/DOSE,) 2 MG/1.5ML SOPN [id number]  Insulin Glargine, 1 Unit Dial, (TOUJEO SOLOSTAR) 300 UNIT/ML SOPN [id number]

## 2018-12-25 NOTE — Patient Instructions (Signed)
Thank you allowing the Chronic Care Management Team to be a part of your care! It was a pleasure speaking with you today!     CCM (Chronic Care Management) Team    Janci Minor RN, BSN Nurse Care Coordinator  [phone]   [name] PharmD  Clinical Pharmacist  [phone]   Eula Fried LCSW Clinical Social Worker [phone]  Visit Information  Goals Addressed            This Visit's Progress   . PharmD- I don't have a blood sugar meter (pt-stated)       Current Barriers:  [name] Knowledge deficits related to impact of diet on blood sugar and medications to manage blood sugar . Non Adherence to prescribed medication regimen . Financial Barriers . Lack of blood sugar results for clinical team  Pharmacist Clinical Goal(s):  [name] Over the next 30 days, patient will work with CM Pharmacist to address needs related to obtaining a blood glucose meter, medication adherence and medication regimen optimization  Interventions: . Perform chart review and note patient has scheduled PCP visit for 10/14 o States that she plans to discuss with PCP her on and off tiredness and nausea . Follow up again regarding overdue eye exam. Ms. Padmore confirms eye exam scheduled for 10/13 . Identify patient overdue for follow up visit with Endocrinologist. Ms. Houlton reports that she missed the appointment o Advise patient to call [facility] Endocrinology now to reschedule. Provide patient with phone number o Advise patient to also request her refills of Ozempic and Toujeo be sent to [place], as she denies having done this . Counsel on importance of medication adherence . Counsel on importance of blood sugar monitoring and control o Denies having been checking blood sugar consistently o Counsel on using blood sugar log to keep record and bringing this log to medical appointments . Encourage patient to occasionally check blood pressure and keep log o Patient reports having  an upper arm monitor o Report that the last times that she has checked, BP has been: 120s/70s-80s . Counsel patient again about importance of calling PCP regarding new medical questions/concerns  Patient Self Care Activities:  . Currently UNABLE to independently manage diabetes as evidenced by a recent A1C of 14% . Patient to check blood sugars as directed and keep log . Patient to attend provider appointments as scheduled o Eye exam scheduled for 10/13 o Next PCP visit scheduled for 10/14   Please see past updates related to this goal by clicking on the "Past Updates" button in the selected goal         The patient verbalized understanding of instructions provided today and declined a print copy of patient instruction materials.   The care management team will reach out to the patient again over the next 14 days.   [name], PharmD, [place] [employer] [phone]

## 2018-12-25 NOTE — Chronic Care Management (AMB) (Signed)
Chronic Care Management   Follow Up Note   12/25/2018 Name: Alyssa Perkins MRN: [id number] DOB: 1950-12-07  Referred by: [name], DO Reason for referral : Chronic Care Management (Patient Phone Call)   Alyssa Perkins is a 68 y.o. year old female who is a primary care patient of [name], DO. The CCM team was consulted for assistance with chronic disease management and care coordination needs.  Alyssa Perkins has a past medical history including but not limited to type 2 diabetes mellitus, hypertension, OSA, depression, hyperlipidemia, chronic back pain and acid reflux.  I reached out to [name] by phone today.   Review of patient status, including review of consultants reports, relevant laboratory and other test results, and collaboration with appropriate care team members and the patient's provider was performed as part of comprehensive patient evaluation and provision of chronic care management services.      Outpatient Encounter Medications as of 12/25/2018  Medication Sig Note  . Blood Glucose Monitoring Suppl (ONETOUCH VERIO) w/Device KIT Use to check blood sugar up to 3 x daily   . diclofenac sodium (VOLTAREN) 1 % GEL Apply 2 g topically 3 (three) times daily as needed. (Patient not taking: Reported on 11/05/2018)   . Elastic Bandages & Supports (MEDICAL COMPRESSION STOCKINGS) MISC 1 each by Does not apply route daily.   [name] gabapentin (NEURONTIN) 400 MG capsule Take 1 capsule (400 mg total) by mouth 3 (three) times daily. (Patient taking differently: Take 400 mg by mouth 2 (two) times daily. )   . hydrOXYzine (ATARAX/VISTARIL) 25 MG tablet Take 1 tablet (25 mg total) by mouth daily as needed for itching. For itching   . ibuprofen (ADVIL,MOTRIN) 200 MG tablet Take 600-800 mg by mouth every 6 (six) hours as needed for moderate pain.   . Insulin Glargine, 1 Unit Dial, (TOUJEO SOLOSTAR) 300 UNIT/ML SOPN Inject 65 Units into the skin daily.  11/05/2018: In morning  . Insulin Pen Needle 31G X 5 MM MISC 1 each by Does not apply route daily. Use to inject Toujeo daily. Dx:E11.9   . omeprazole (PRILOSEC) 20 MG capsule Take 1 capsule (20 mg total) by mouth daily.   [name] Lancets 78G MISC Use to check blood sugar up to 3 x daily   . ONETOUCH VERIO test strip Check blood sugar up to 3 x daily   . quinapril (ACCUPRIL) 20 MG tablet Take 1 tablet (20 mg total) by mouth at bedtime.   . Semaglutide,0.25 or 0.5MG/DOS, (OZEMPIC, 0.25 OR 0.5 MG/DOSE,) 2 MG/1.5ML SOPN Inject 0.25 mg into the skin once a week. For first 4 weeks. Then increase dose to 0.37m weekly 11/05/2018: Taking on Fridays  . sertraline (ZOLOFT) 50 MG tablet Take 1 tablet (50 mg total) by mouth daily.   . simvastatin (ZOCOR) 40 MG tablet Take 1 tablet (40 mg total) by mouth at bedtime. Reported on 05/16/2015 (Patient not taking: Reported on 11/05/2018)   . traMADol (ULTRAM) 50 MG tablet Take 1 tablet (50 mg total) by mouth daily as needed for moderate pain (headache).   . traZODone (DESYREL) 50 MG tablet Take 1 tablet (50 mg total) by mouth at bedtime.    No facility-administered encounter medications on file as of 12/25/2018.     Goals Addressed            This Visit's Progress   . PharmD- I don't have a blood sugar meter (pt-stated)       Current  Barriers:  [name] Knowledge deficits related to impact of diet on blood sugar and medications to manage blood sugar . Non Adherence to prescribed medication regimen . Financial Barriers . Lack of blood sugar results for clinical team  Pharmacist Clinical Goal(s):  [name] Over the next 30 days, patient will work with CM Pharmacist to address needs related to obtaining a blood glucose meter, medication adherence and medication regimen optimization  Interventions: . Perform chart review and note patient has scheduled PCP visit for 10/14 o States that she plans to discuss with PCP her on and off tiredness and nausea o Note  patient also to follow up with provider about not currently using CPAP . Follow up again regarding overdue eye exam. Alyssa Perkins confirms eye exam scheduled for 10/13 . Identify patient overdue for follow up visit with Endocrinologist. Alyssa Perkins reports that she missed the appointment because she forgot o Advise patient to call [facility] Endocrinology now to reschedule. Provide patient with phone number o Advise patient to also request her refills of Ozempic and Toujeo be sent to [place], as she denies having done this . Counsel on importance of medication adherence . Counsel on importance of blood sugar monitoring and control o Denies having been checking blood sugar consistently o Counsel on using blood sugar log to keep record and bringing this log to medical appointments . Encourage patient to occasionally check blood pressure and keep log o Patient reports having an upper arm monitor o Report that the last times that she has checked, BP has been: 120s/70s-80s . Counsel patient again about importance of calling PCP regarding new medical questions/concerns  Patient Self Care Activities:  . Currently UNABLE to independently manage diabetes as evidenced by a recent A1C of 14% . Patient to check blood sugars as directed and keep log . Patient to attend provider appointments as scheduled o Eye exam scheduled for 10/13 o Next PCP visit scheduled for 10/14   Please see past updates related to this goal by clicking on the "Past Updates" button in the selected goal         Plan  The care management team will reach out to the patient again over the next 14 days.   Alyssa Perkins, PharmD, [place] [employer] [phone]

## 2018-12-28 ENCOUNTER — Other Ambulatory Visit: Payer: Self-pay

## 2018-12-30 ENCOUNTER — Ambulatory Visit: Payer: Medicare Other | Admitting: Family Medicine

## 2019-01-04 ENCOUNTER — Telehealth: Payer: Self-pay

## 2019-01-06 ENCOUNTER — Other Ambulatory Visit: Payer: Self-pay | Admitting: Nurse Practitioner

## 2019-01-06 ENCOUNTER — Ambulatory Visit: Payer: Medicare Other | Admitting: Pharmacist

## 2019-01-06 DIAGNOSIS — IMO0002 Reserved for concepts with insufficient information to code with codable children: Secondary | ICD-10-CM

## 2019-01-06 DIAGNOSIS — E1140 Type 2 diabetes mellitus with diabetic neuropathy, unspecified: Secondary | ICD-10-CM

## 2019-01-06 DIAGNOSIS — E1149 Type 2 diabetes mellitus with other diabetic neurological complication: Secondary | ICD-10-CM

## 2019-01-06 MED ORDER — OZEMPIC (0.25 OR 0.5 MG/DOSE) 2 MG/1.5ML ~~LOC~~ SOPN: 0.5000 mg | PEN_INJECTOR | SUBCUTANEOUS | 0 refills | Status: DC

## 2019-01-06 NOTE — Chronic Care Management (AMB) (Signed)
Chronic Care Management   Follow Up Note   01/06/2019 Name: PIERINA SCHUKNECHT MRN: [id number] DOB: Sep 11, 1950  Referred by: [name], DO Reason for referral : Chronic Care Management (Patient Phone Call)   MAYBELLINE KOLARIK is a 68 y.o. year old female who is a primary care patient of [name], DO. The CCM team was consulted for assistance with chronic disease management and care coordination needs.  Ms. Kath has a past medical history including but not limited to type 2 diabetes mellitus, hypertension, OSA, depression, hyperlipidemia, chronic back pain and acid reflux.  I reached out to [name] by phone today.   Review of patient status, including review of consultants reports, relevant laboratory and other test results, and collaboration with appropriate care team members and the patient's provider was performed as part of comprehensive patient evaluation and provision of chronic care management services.     Outpatient Encounter Medications as of 01/06/2019  Medication Sig  . Blood Glucose Monitoring Suppl (ONETOUCH VERIO) w/Device KIT Use to check blood sugar up to 3 x daily  . diclofenac sodium (VOLTAREN) 1 % GEL Apply 2 g topically 3 (three) times daily as needed. (Patient not taking: Reported on 11/05/2018)  . Elastic Bandages & Supports (MEDICAL COMPRESSION STOCKINGS) MISC 1 each by Does not apply route daily.  [name] gabapentin (NEURONTIN) 400 MG capsule Take 1 capsule (400 mg total) by mouth 3 (three) times daily. (Patient taking differently: Take 400 mg by mouth 2 (two) times daily. )  . hydrOXYzine (ATARAX/VISTARIL) 25 MG tablet Take 1 tablet (25 mg total) by mouth daily as needed for itching. For itching  . ibuprofen (ADVIL,MOTRIN) 200 MG tablet Take 600-800 mg by mouth every 6 (six) hours as needed for moderate pain.  . Insulin Glargine, 1 Unit Dial, (TOUJEO SOLOSTAR) 300 UNIT/ML SOPN Inject 65 Units into the skin daily.  .  Insulin Pen Needle 31G X 5 MM MISC 1 each by Does not apply route daily. Use to inject Toujeo daily. Dx:E11.9  . omeprazole (PRILOSEC) 20 MG capsule Take 1 capsule (20 mg total) by mouth daily.  [name] Lancets 12O MISC Use to check blood sugar up to 3 x daily  . ONETOUCH VERIO test strip Check blood sugar up to 3 x daily  . quinapril (ACCUPRIL) 20 MG tablet Take 1 tablet (20 mg total) by mouth at bedtime.  . sertraline (ZOLOFT) 50 MG tablet Take 1 tablet (50 mg total) by mouth daily.  . simvastatin (ZOCOR) 40 MG tablet Take 1 tablet (40 mg total) by mouth at bedtime. Reported on 05/16/2015 (Patient not taking: Reported on 11/05/2018)  . traMADol (ULTRAM) 50 MG tablet Take 1 tablet (50 mg total) by mouth daily as needed for moderate pain (headache).  . traZODone (DESYREL) 50 MG tablet Take 1 tablet (50 mg total) by mouth at bedtime.  . [DISCONTINUED] Semaglutide,0.25 or 0.5MG/DOS, (OZEMPIC, 0.25 OR 0.5 MG/DOSE,) 2 MG/1.5ML SOPN Inject 0.25 mg into the skin once a week. For first 4 weeks. Then increase dose to 0.1m weekly   No facility-administered encounter medications on file as of 01/06/2019.     Goals Addressed            This Visit's Progress   . PharmD- I don't have a blood sugar meter (pt-stated)       Current Barriers:  .Marland KitchenKnowledge deficits related to impact of diet on blood sugar and medications to manage blood sugar . Non Adherence to  prescribed medication regimen . Financial Barriers . Lack of blood sugar results for clinical team  Pharmacist Clinical Goal(s):  [name] Over the next 30 days, patient will work with CM Pharmacist to address needs related to obtaining a blood glucose meter, medication adherence and medication regimen optimization  Interventions: . Advise patient to call PCP regarding current illness o Ms. Alamo reports that she has had "flu-like" symptoms for about a week, including headache, runny nose, sweating, fever (on and off) and trouble sleeping.  o Denies improvement over time, as she reports "some days are worse and some days better" o Reports plans to travel out of town in a week o Provide patient with phone number for PCP; patient states that she will call office now . Perform chart review o Note patient called PCP office on 10/9 to request refills of Toujeo and Ozempic sent to [place].  - Note Toujeo sent to [facility], but Ozempic sent to [facility]. Follow up with NP to request Ozempic prescription update to [place] patient to follow up with Endocrinologist for future refills of medication for diabetes management o Patient has not yet rescheduled missed appointment with Endocrinology o Note that patient missed scheduled PCP appointment on 10/14  Patient Self Care Activities:  . Currently UNABLE to independently manage diabetes as evidenced by a recent A1C of 14% . Patient to check blood sugars as directed and keep log . Patient to attend provider appointments as scheduled   Please see past updates related to this goal by clicking on the "Past Updates" button in the selected goal         Plan  The care management team will reach out to the patient again over the next 7 days.   [name], PharmD, [place] [employer] [phone]

## 2019-01-06 NOTE — Patient Instructions (Signed)
Thank you allowing the Chronic Care Management Team to be a part of your care! It was a pleasure speaking with you today!     CCM (Chronic Care Management) Team    Janci Minor RN, BSN Nurse Care Coordinator  [phone]   [name] PharmD  Clinical Pharmacist  [phone]   Eula Fried LCSW Clinical Social Worker [phone]  Visit Information  Goals Addressed            This Visit's Progress   . PharmD- I don't have a blood sugar meter (pt-stated)       Current Barriers:  [name] Knowledge deficits related to impact of diet on blood sugar and medications to manage blood sugar . Non Adherence to prescribed medication regimen . Financial Barriers . Lack of blood sugar results for clinical team  Pharmacist Clinical Goal(s):  [name] Over the next 30 days, patient will work with CM Pharmacist to address needs related to obtaining a blood glucose meter, medication adherence and medication regimen optimization  Interventions: . Advise patient to call PCP regarding current illness o Ms. Burgin reports that she has had "flu-like" symptoms for about a week, including headache, runny nose, sweating, fever (on and off) and trouble sleeping. o Denies improvement over time, as she reports "some days are worse and some days better" o Reports plans to travel out of town in a week o Provide patient with phone number for PCP; patient states that she will call office now . Perform chart review o Patient has not yet rescheduled missed appointment with Endocrinology o Note that patient missed scheduled PCP appointment on 10/14  Patient Self Care Activities:  . Currently UNABLE to independently manage diabetes as evidenced by a recent A1C of 14% . Patient to check blood sugars as directed and keep log . Patient to attend provider appointments as scheduled   Please see past updates related to this goal by clicking on the "Past Updates" button in the selected goal         The  patient verbalized understanding of instructions provided today and declined a print copy of patient instruction materials.   The care management team will reach out to the patient again over the next 7 days.   [name], PharmD, [place] [employer] [phone]

## 2019-01-07 ENCOUNTER — Telehealth: Payer: Self-pay

## 2019-01-08 ENCOUNTER — Ambulatory Visit: Payer: Medicare Other | Admitting: Family Medicine

## 2019-01-13 ENCOUNTER — Ambulatory Visit (INDEPENDENT_AMBULATORY_CARE_PROVIDER_SITE_OTHER): Payer: Medicare Other | Admitting: Pharmacist

## 2019-01-13 DIAGNOSIS — E1165 Type 2 diabetes mellitus with hyperglycemia: Secondary | ICD-10-CM

## 2019-01-13 DIAGNOSIS — Z794 Long term (current) use of insulin: Secondary | ICD-10-CM

## 2019-01-13 DIAGNOSIS — E1140 Type 2 diabetes mellitus with diabetic neuropathy, unspecified: Secondary | ICD-10-CM | POA: Diagnosis not present

## 2019-01-13 DIAGNOSIS — IMO0002 Reserved for concepts with insufficient information to code with codable children: Secondary | ICD-10-CM

## 2019-01-13 NOTE — Chronic Care Management (AMB) (Signed)
Chronic Care Management   Follow Up Note   01/13/2019 Name: Alyssa Perkins MRN: [id number] DOB: 06-Jul-1950  Referred by: [name], DO Reason for referral : Chronic Care Management (Patient Phone Call)   Alyssa Perkins is a 68 y.o. year old female who is a primary care patient of [name], DO. The CCM team was consulted for assistance with chronic disease management and care coordination needs.  Ms. Boyte has a past medical history including but not limited to type 2 diabetes mellitus, hypertension, OSA, depression, hyperlipidemia, chronic back pain and acid reflux.  I reached out to [name] by phone today.   Review of patient status, including review of consultants reports, relevant laboratory and other test results, and collaboration with appropriate care team members and the patient's provider was performed as part of comprehensive patient evaluation and provision of chronic care management services.     Outpatient Encounter Medications as of 01/13/2019  Medication Sig  . Blood Glucose Monitoring Suppl (ONETOUCH VERIO) w/Device KIT Use to check blood sugar up to 3 x daily  . diclofenac sodium (VOLTAREN) 1 % GEL Apply 2 g topically 3 (three) times daily as needed. (Patient not taking: Reported on 11/05/2018)  . Elastic Bandages & Supports (MEDICAL COMPRESSION STOCKINGS) MISC 1 each by Does not apply route daily.  [name] gabapentin (NEURONTIN) 400 MG capsule Take 1 capsule (400 mg total) by mouth 3 (three) times daily. (Patient taking differently: Take 400 mg by mouth 2 (two) times daily. )  . hydrOXYzine (ATARAX/VISTARIL) 25 MG tablet Take 1 tablet (25 mg total) by mouth daily as needed for itching. For itching  . ibuprofen (ADVIL,MOTRIN) 200 MG tablet Take 600-800 mg by mouth every 6 (six) hours as needed for moderate pain.  . Insulin Glargine, 1 Unit Dial, (TOUJEO SOLOSTAR) 300 UNIT/ML SOPN Inject 65 Units into the skin daily.  .  Insulin Pen Needle 31G X 5 MM MISC 1 each by Does not apply route daily. Use to inject Toujeo daily. Dx:E11.9  . omeprazole (PRILOSEC) 20 MG capsule Take 1 capsule (20 mg total) by mouth daily.  Alyssa Perkins 28U MISC Use to check blood sugar up to 3 x daily  . ONETOUCH VERIO test strip Check blood sugar up to 3 x daily  . quinapril (ACCUPRIL) 20 MG tablet Take 1 tablet (20 mg total) by mouth at bedtime.  . Semaglutide,0.25 or 0.5MG/DOS, (OZEMPIC, 0.25 OR 0.5 MG/DOSE,) 2 MG/1.5ML SOPN Inject 0.5 mg into the skin once a week. (Patient not taking: Reported on 01/13/2019)  . sertraline (ZOLOFT) 50 MG tablet Take 1 tablet (50 mg total) by mouth daily.  . simvastatin (ZOCOR) 40 MG tablet Take 1 tablet (40 mg total) by mouth at bedtime. Reported on 05/16/2015 (Patient not taking: Reported on 11/05/2018)  . traMADol (ULTRAM) 50 MG tablet Take 1 tablet (50 mg total) by mouth daily as needed for moderate pain (headache).  . traZODone (DESYREL) 50 MG tablet Take 1 tablet (50 mg total) by mouth at bedtime.   No facility-administered encounter medications on file as of 01/13/2019.     Goals Addressed            This Visit's Progress   . PharmD- I don't have a blood sugar meter (pt-stated)       Current Barriers:  [name] Knowledge deficits related to impact of diet on blood sugar and medications to manage blood sugar . Non Adherence to prescribed medication regimen . Financial  Barriers . Lack of blood sugar results for clinical team  Pharmacist Clinical Goal(s):  [name] Over the next 30 days, patient will work with CM Pharmacist to address needs related to obtaining a blood glucose meter, medication adherence and medication regimen optimization  Interventions: . Follow up with Ms. Sarafian regarding reported flu-like symptoms from last week o Reports that she is feeling better o Denies having called PCP about symptoms  . Coordination of care: Denies having called Endocrinology to reschedule  appointment. States "If I don't do something right then, I forget" o Reports has not yet receive refill of Ozempic from [place] and has run out o Advise patient to call Endocrinologist now. Provide patient number. Encourage patient to 1) reschedule missed appointment 2) Provide recent blood sugar numbers and request guidance for how to manage blood sugars while waiting for her Ozempic refill to come in the mail. . Ms. Ramthun calls back to report she has called Endocrinology to reschedule appointment and request guidance on diabetes management o Reports that she has also called [facility] and been informed that her Ozepmic will be mailed out tomorrow. . Encourage patient to also call to reschedule missed PCP visit o Patient reports that she has not been sleeping well. Confirms that she will follow up with PCP regarding sleep apnea o Remind patient to bring blood pressure and blood sugar logs with her to medical appointments  Patient Self Care Activities:  . Currently UNABLE to independently manage diabetes as evidenced by a recent A1C of 14% . Patient to check blood sugars as directed and keep log . Patient to attend provider appointments as scheduled o Next Endocrinology visit scheduled for: 11/23   Please see past updates related to this goal by clicking on the "Past Updates" button in the selected goal         Plan  The care management team will reach out to the patient again over the next 14 days.   [name], PharmD, [place] [employer] [phone]

## 2019-01-13 NOTE — Patient Instructions (Signed)
Thank you allowing the Chronic Care Management Team to be a part of your care! It was a pleasure speaking with you today!     CCM (Chronic Care Management) Team    Janci Minor RN, BSN Nurse Care Coordinator  [phone]   [name] PharmD  Clinical Pharmacist  [phone]   Eula Fried LCSW Clinical Social Worker [phone]  Visit Information  Goals Addressed            This Visit's Progress   . PharmD- I don't have a blood sugar meter (pt-stated)       Current Barriers:  [name] Knowledge deficits related to impact of diet on blood sugar and medications to manage blood sugar . Non Adherence to prescribed medication regimen . Financial Barriers . Lack of blood sugar results for clinical team  Pharmacist Clinical Goal(s):  [name] Over the next 30 days, patient will work with CM Pharmacist to address needs related to obtaining a blood glucose meter, medication adherence and medication regimen optimization  Interventions: . Follow up with Ms. Karl regarding reported flu-like symptoms from last week o Reports that she is feeling better o Denies having called PCP about symptoms  . Coordination of care: Denies having called Endocrinology to reschedule appointment. States "If I don't do something right then, I forget" o Reports has not yet receive refill of Ozempic from [place] and has run out o Advise patient to call Endocrinologist now. Provide patient number.  . Ms. Riccardi calls back to report she has called Endocrinology to reschedule appointment and request guidance on diabetes management o Reports that she has also called [facility] and been informed that her Ozepmic will be mailed out tomorrow. . Encourage patient to also call to reschedule missed PCP visit o Remind patient to bring blood pressure and blood sugar logs with her to medical appointments  Patient Self Care Activities:  . Currently UNABLE to independently manage diabetes as evidenced by a recent  A1C of 14% . Patient to check blood sugars as directed and keep log . Patient to attend provider appointments as scheduled o Next Endocrinology visit scheduled for: 11/23   Please see past updates related to this goal by clicking on the "Past Updates" button in the selected goal         The patient verbalized understanding of instructions provided today and declined a print copy of patient instruction materials.   The care management team will reach out to the patient again over the next 14 days.   [name], PharmD, [place] [employer] [phone]

## 2019-01-21 ENCOUNTER — Ambulatory Visit
Admission: RE | Admit: 2019-01-21 | Discharge: 2019-01-21 | Disposition: A | Discharge status: Home / Self Care | Payer: Medicare Other | Source: Ambulatory Visit | Attending: Family Medicine | Admitting: Family Medicine

## 2019-01-21 ENCOUNTER — Telehealth: Payer: Self-pay

## 2019-01-21 ENCOUNTER — Other Ambulatory Visit: Payer: Self-pay

## 2019-01-21 DIAGNOSIS — Z1231 Encounter for screening mammogram for malignant neoplasm of breast: Secondary | ICD-10-CM | POA: Diagnosis not present

## 2019-01-26 ENCOUNTER — Emergency Department
Admission: EM | Admit: 2019-01-26 | Discharge: 2019-01-26 | Disposition: A | Discharge status: Home / Self Care | Payer: Medicare Other | Attending: Student | Admitting: Student

## 2019-01-26 ENCOUNTER — Other Ambulatory Visit: Payer: Self-pay

## 2019-01-26 ENCOUNTER — Encounter: Payer: Self-pay | Admitting: Emergency Medicine

## 2019-01-26 DIAGNOSIS — K529 Noninfective gastroenteritis and colitis, unspecified: Secondary | ICD-10-CM | POA: Diagnosis not present

## 2019-01-26 DIAGNOSIS — Z79899 Other long term (current) drug therapy: Secondary | ICD-10-CM | POA: Diagnosis not present

## 2019-01-26 DIAGNOSIS — R519 Headache, unspecified: Secondary | ICD-10-CM | POA: Diagnosis not present

## 2019-01-26 DIAGNOSIS — I10 Essential (primary) hypertension: Secondary | ICD-10-CM | POA: Diagnosis not present

## 2019-01-26 DIAGNOSIS — E119 Type 2 diabetes mellitus without complications: Secondary | ICD-10-CM | POA: Insufficient documentation

## 2019-01-26 DIAGNOSIS — R112 Nausea with vomiting, unspecified: Secondary | ICD-10-CM | POA: Diagnosis not present

## 2019-01-26 DIAGNOSIS — Z794 Long term (current) use of insulin: Secondary | ICD-10-CM | POA: Insufficient documentation

## 2019-01-26 DIAGNOSIS — R1013 Epigastric pain: Secondary | ICD-10-CM | POA: Diagnosis not present

## 2019-01-26 DIAGNOSIS — Z87891 Personal history of nicotine dependence: Secondary | ICD-10-CM | POA: Insufficient documentation

## 2019-01-26 LAB — CBC WITH DIFFERENTIAL/PLATELET
Abs Immature Granulocytes: 0.04 10*3/uL (ref 0.00–0.07)
Basophils Absolute: 0 10*3/uL (ref 0.0–0.1)
Basophils Relative: 0 %
Eosinophils Absolute: 0.3 10*3/uL (ref 0.0–0.5)
Eosinophils Relative: 3 %
HCT: 44.8 % (ref 36.0–46.0)
Hemoglobin: 14.6 g/dL (ref 12.0–15.0)
Immature Granulocytes: 0 %
Lymphocytes Relative: 24 %
Lymphs Abs: 2.6 10*3/uL (ref 0.7–4.0)
MCH: 27.5 pg (ref 26.0–34.0)
MCHC: 32.6 g/dL (ref 30.0–36.0)
MCV: 84.5 fL (ref 80.0–100.0)
Monocytes Absolute: 0.6 10*3/uL (ref 0.1–1.0)
Monocytes Relative: 5 %
Neutro Abs: 7.2 10*3/uL (ref 1.7–7.7)
Neutrophils Relative %: 68 %
Platelets: 271 10*3/uL (ref 150–400)
RBC: 5.3 MIL/uL — ABNORMAL HIGH (ref 3.87–5.11)
RDW: 14 % (ref 11.5–15.5)
WBC: 10.8 10*3/uL — ABNORMAL HIGH (ref 4.0–10.5)
nRBC: 0 % (ref 0.0–0.2)

## 2019-01-26 LAB — BASIC METABOLIC PANEL
Anion gap: 11 (ref 5–15)
BUN: 12 mg/dL (ref 8–23)
CO2: 23 mmol/L (ref 22–32)
Calcium: 8.8 mg/dL — ABNORMAL LOW (ref 8.9–10.3)
Chloride: 103 mmol/L (ref 98–111)
Creatinine, Ser: 0.82 mg/dL (ref 0.44–1.00)
GFR calc Af Amer: >60 mL/min (ref >60)
GFR calc non Af Amer: >60 mL/min (ref >60)
Glucose, Bld: 119 mg/dL — ABNORMAL HIGH (ref 70–99)
Potassium: 3.9 mmol/L (ref 3.5–5.1)
Sodium: 137 mmol/L (ref 135–145)

## 2019-01-26 LAB — URINALYSIS, COMPLETE (UACMP) WITH MICROSCOPIC
Bilirubin Urine: NEGATIVE
Glucose, UA: NEGATIVE mg/dL
Hgb urine dipstick: NEGATIVE
Ketones, ur: NEGATIVE mg/dL
Leukocytes,Ua: NEGATIVE
Nitrite: NEGATIVE
Protein, ur: NEGATIVE mg/dL
Specific Gravity, Urine: 1.009 (ref 1.005–1.030)
pH: 7 (ref 5.0–8.0)

## 2019-01-26 LAB — TROPONIN I (HIGH SENSITIVITY): Troponin I (High Sensitivity): 4 ng/L (ref <18)

## 2019-01-26 MED ORDER — RANITIDINE HCL 300 MG PO TABS: 300.0000 mg | ORAL_TABLET | Freq: Every day | ORAL | 0 refills | Status: DC

## 2019-01-26 MED ORDER — ONDANSETRON 4 MG PO TBDP: 4.0000 mg | ORAL_TABLET | Freq: Three times a day (TID) | ORAL | 0 refills | Status: DC | PRN

## 2019-01-26 MED ORDER — PROMETHAZINE HCL 25 MG/ML IJ SOLN
25.0000 mg | Freq: Once | INTRAMUSCULAR | Status: AC
  Administered 2019-01-26: 16:00:00 25 mg via INTRAMUSCULAR
  Filled 2019-01-26: qty 1

## 2019-01-26 MED ORDER — DIPHENHYDRAMINE HCL 50 MG/ML IJ SOLN
50.0000 mg | Freq: Once | INTRAMUSCULAR | Status: AC
  Administered 2019-01-26: 50 mg via INTRAMUSCULAR
  Filled 2019-01-26: qty 1

## 2019-01-26 MED ORDER — DEXAMETHASONE SODIUM PHOSPHATE 10 MG/ML IJ SOLN
10.0000 mg | Freq: Once | INTRAMUSCULAR | Status: AC
  Administered 2019-01-26: 10 mg via INTRAMUSCULAR
  Filled 2019-01-26: qty 1

## 2019-01-26 MED ORDER — FAMOTIDINE 20 MG PO TABS: 20.0000 mg | ORAL_TABLET | Freq: Every day | ORAL | 0 refills | Status: DC

## 2019-01-26 NOTE — ED Provider Notes (Signed)
[facility] Emergency Department Provider Note  ____________________________________________  Time seen: Approximately 3:31 PM  I have reviewed the triage vital signs and the nursing notes.   HISTORY  Chief Complaint Abdominal Pain    HPI Alyssa Perkins is a 68 y.o. female who presents the emergency department complaining of headache, epigastric discomfort, nausea, vomiting, diarrhea.  Patient reports that she has a history of intermittent headaches and has had a worsened headache while experiencing the other GI complaints.  No recent trauma to the head.  No blurred vision, weakness on one side of the body or the other.  Patient states that it is a global headache, worsen with emesis.  Patient has a history of migraines.  Patient has a history of GERD and takes Prilosec for same.  She states that she has a intermittent burning sensation from her stomach into her esophagus.  She has had nausea, emesis.  No hematic emesis.  Patient has had diarrhea as well but no bloody diarrhea.  Patient denies any frank abdominal pain.   Patient has a history of cholecystectomy as well as hysterectomy.  Patient denies any flank pain, dysuria, polyuria or hematuria.  Other than Advil for headache, patient has been taking no medications for his complaints.  Complaints of been ongoing x3 days.  Patient has a history of angina, anxiety, chronic back pain, depression, GERD, migraines, hyperlipidemia, sleep apnea, diabetes.        Past Medical History:  Diagnosis Date  . Anginal pain ([place])   . Anxiety    not on any medication at this time  . Arthritis   . Asthma    "sports asthma or exercised induced"seen at [place] for primary  . Chronic back pain   . Chronic neck pain   . Complication of anesthesia    "difficulty waking up from surgery"  . Depression    has OCD, states counts constantly  . GERD (gastroesophageal reflux disease)   . Headache(784.0)    hx of migraines  . Hyperlipidemia   . IBS (irritable bowel syndrome)   . PONV (postoperative nausea and vomiting)   . RLS (restless legs syndrome)   . Sleep apnea   . Thyroid disease     Patient Active Problem List   Diagnosis Date Noted  . Moderate episode of recurrent major depressive disorder ([place]) 04/09/2018  . BPPV (benign paroxysmal positional vertigo), right 10/15/2017  . Diabetic retinopathy ([place]) 02/26/2017  . Insomnia 02/21/2017  . Chronic headaches 02/21/2017  . OSA on CPAP 10/21/2016  . Depression 10/15/2016  . Chronic back pain 09/11/2016  . Chronic neck pain 09/11/2016  . RLS (restless legs syndrome) 07/08/2016  . Bilateral carpal tunnel syndrome 03/26/2016  . De Quervain's tenosynovitis, left 11/14/2015  . DM (diabetes mellitus), type 2, uncontrolled w/neurologic complication ([place]) 08/65/7846  . Hyperlipidemia associated with type 2 diabetes mellitus ([place]) 09/16/2014  . Hypertension 09/16/2014  . Acid reflux 09/16/2014  . Cervical radiculopathy 12/30/2011    Past Surgical History:  Procedure Laterality Date  . ABDOMINAL HYSTERECTOMY     also had oophorectomy  . AMPUTATION TOE Right 09/05/2017   Procedure: AMPUTATION TOE-RIGHT 2ND TOE;  Surgeon: Sharlotte Alamo, DPM;  Location: [facility] ORS;  Service: Podiatry;  Laterality: Right;  . ANTERIOR CERVICAL DECOMP/DISCECTOMY FUSION  01/02/2012   Procedure: ANTERIOR CERVICAL DECOMPRESSION/DISCECTOMY FUSION 2 LEVELS;  Surgeon: Melina Schools, MD;  Location: [place];  Service: Orthopedics;  Laterality: N/A;  ACDF C5-7  . BILATERAL CARPAL TUNNEL  RELEASE    . CARDIAC CATHETERIZATION     done approx. 4- 5 years ago, Dr. Leanor Kail, does not see cardi. at this time.  . CHOLECYSTECTOMY    . DILATION AND CURETTAGE OF UTERUS     "several"  . HERNIA REPAIR    . IRRIGATION AND DEBRIDEMENT FOOT  09/05/2017   Procedure: IRRIGATION AND DEBRIDEMENT FOOT;  Surgeon: Sharlotte Alamo, DPM;  Location: [facility] ORS;  Service: Podiatry;;  . KNEE  SURGERY     1 partial left, 1 total left  . SPINE SURGERY      Prior to Admission medications   Medication Sig Start Date End Date Taking? Authorizing Provider  Blood Glucose Monitoring Suppl (ONETOUCH VERIO) w/Device KIT Use to check blood sugar up to 3 x daily 10/28/18   [name], DO  diclofenac sodium (VOLTAREN) 1 % GEL Apply 2 g topically 3 (three) times daily as needed. Patient not taking: Reported on 11/05/2018 11/13/17   [name], DO  Elastic Bandages & Supports ([place]) [place] 1 each by Does not apply route daily. 11/14/15   Luciana Axe, NP  famotidine (PEPCID) 20 MG tablet Take 1 tablet (20 mg total) by mouth daily. 01/26/19 01/26/20  Cuthriell, Charline Bills, PA-C  gabapentin (NEURONTIN) 400 MG capsule Take 1 capsule (400 mg total) by mouth 3 (three) times daily. Patient taking differently: Take 400 mg by mouth 2 (two) times daily.  04/09/18   Karamalegos, Devonne Doughty, DO  hydrOXYzine (ATARAX/VISTARIL) 25 MG tablet Take 1 tablet (25 mg total) by mouth daily as needed for itching. For itching 03/13/18   [name], DO  ibuprofen (ADVIL,MOTRIN) 200 MG tablet Take 600-800 mg by mouth every 6 (six) hours as needed for moderate pain.    [provider]  Insulin Glargine, 1 Unit Dial, (TOUJEO SOLOSTAR) 300 UNIT/ML SOPN Inject 65 Units into the skin daily. 12/25/18   Mikey College, NP  Insulin Pen Needle 31G X 5 MM MISC 1 each by Does not apply route daily. Use to inject Toujeo daily. Dx:E11.9 11/28/15   Krebs, Genevie Cheshire, NP  omeprazole (PRILOSEC) 20 MG capsule Take 1 capsule (20 mg total) by mouth daily. 03/13/18   Karamalegos, Devonne Doughty, DO  ondansetron (ZOFRAN-ODT) 4 MG disintegrating tablet Take 1 tablet (4 mg total) by mouth every 8 (eight) hours as needed for nausea or vomiting. 01/26/19   Cuthriell, Charline Bills, PA-C  OneTouch Delica Lancets 86P MISC Use to check blood sugar up to 3 x daily 10/29/18    Parks Ranger, Devonne Doughty, DO  [facility] VERIO test strip Check blood sugar up to 3 x daily 10/29/18   Parks Ranger, Devonne Doughty, DO  quinapril (ACCUPRIL) 20 MG tablet Take 1 tablet (20 mg total) by mouth at bedtime. 03/13/18   Karamalegos, Devonne Doughty, DO  Semaglutide,0.25 or 0.5MG/DOS, (OZEMPIC, 0.25 OR 0.5 MG/DOSE,) 2 MG/1.5ML SOPN Inject 0.5 mg into the skin once a week. Patient not taking: Reported on 01/13/2019 01/06/19   Mikey College, NP  sertraline (ZOLOFT) 50 MG tablet Take 1 tablet (50 mg total) by mouth daily. 03/13/18   Karamalegos, Devonne Doughty, DO  simvastatin (ZOCOR) 40 MG tablet Take 1 tablet (40 mg total) by mouth at bedtime. Reported on 05/16/2015 Patient not taking: Reported on 11/05/2018 03/13/18   [name], DO  traMADol (ULTRAM) 50 MG tablet Take 1 tablet (50 mg total) by mouth daily as needed for moderate pain (headache). 11/04/18   Nobie Putnam  J, DO  traZODone (DESYREL) 50 MG tablet Take 1 tablet (50 mg total) by mouth at bedtime. 03/13/18   Karamalegos, Devonne Doughty, DO  ranitidine (ZANTAC) 300 MG tablet Take 1 tablet (300 mg total) by mouth daily. 01/26/19 01/26/19  Cuthriell, Charline Bills, PA-C    Allergies Patient has no known allergies.  Family History  Problem Relation Age of Onset  . Heart disease Mother   . Osteoporosis Mother   . Breast cancer Mother        dx with ovarian ca 1st  . Ovarian cancer Mother        68's  . Osteoporosis Sister   . Diabetes Sister   . Breast cancer Sister        early 20's no braca testing   . Cancer Brother        sinus, bone  . Osteoporosis Brother     Social History Social History   Tobacco Use  . Smoking status: Former Smoker    Packs/day: 0.50    Years: 15.00    Pack years: 7.50    Types: Cigarettes    Quit date: 07/17/1994    Years since quitting: 24.5  . Smokeless tobacco: Former Systems developer  . Tobacco comment: stopped 18 years ago  Substance Use Topics  . Alcohol use: No  . Drug use: No      Review of Systems  Constitutional: No fever/chills Eyes: No visual changes. No discharge ENT: No upper respiratory complaints. Cardiovascular: no chest pain. Respiratory: no cough. No SOB. Gastrointestinal: Intermittent epigastric abdominal pain.  Positive for nausea, vomiting and diarrhea.  No constipation. Genitourinary: Negative for dysuria. No hematuria Musculoskeletal: Negative for musculoskeletal pain. Skin: Negative for rash, abrasions, lacerations, ecchymosis. Neurological: Negative for headaches, focal weakness or numbness. 10-point ROS otherwise negative.  ____________________________________________   PHYSICAL EXAM:  VITAL SIGNS: ED Triage Vitals [01/26/19 1431]  Enc Vitals Group     BP (!) 143/72     Pulse Rate 76     Resp 16     Temp 98.4 F (36.9 C)     Temp Source Oral     SpO2 96 %     Weight      Height      Head Circumference      Peak Flow      Pain Score 8     Pain Loc      Pain Edu?      Excl. in [place]?      Constitutional: Alert and oriented. Well appearing and in no acute distress. Eyes: Conjunctivae are normal. PERRL. EOMI. Head: Atraumatic. ENT:      Ears:       Nose: No congestion/rhinnorhea.      Mouth/Throat: Mucous membranes are moist.  Neck: No stridor.  Neck is supple full range of motion Hematological/Lymphatic/Immunilogical: No cervical lymphadenopathy. Cardiovascular: Normal rate, regular rhythm. Normal S1 and S2.  Good peripheral circulation. Respiratory: Normal respiratory effort without tachypnea or retractions. Lungs CTAB. Good air entry to the bases with no decreased or absent breath sounds. Gastrointestinal: Bowel sounds 4 quadrants. Soft and nontender to palpation all quadrants.  With palpation in the epigastric region, patient states that she has a burning sensation in my "throat."  No associated abdominal pain with palpation in the epigastric region however.. No guarding or rigidity. No palpable masses. No distention.  No CVA tenderness. Musculoskeletal: Full range of motion to all extremities. No gross deformities appreciated. Neurologic:  Normal speech and language. No gross  focal neurologic deficits are appreciated.  Cranial nerves II through XII grossly intact.  Negative Romberg's and pronator drift. Skin:  Skin is warm, dry and intact. No rash noted. Psychiatric: Mood and affect are normal. Speech and behavior are normal. Patient exhibits appropriate insight and judgement.   ____________________________________________   LABS (all labs ordered are listed, but only abnormal results are displayed)  Labs Reviewed  BASIC METABOLIC PANEL - Abnormal; Notable for the following components:      Result Value   Glucose, Bld 119 (*)    Calcium 8.8 (*)    All other components within normal limits  CBC WITH DIFFERENTIAL/PLATELET - Abnormal; Notable for the following components:   WBC 10.8 (*)    RBC 5.30 (*)    All other components within normal limits  URINALYSIS, COMPLETE (UACMP) WITH MICROSCOPIC - Abnormal; Notable for the following components:   Color, Urine YELLOW (*)    APPearance CLEAR (*)    Bacteria, UA RARE (*)    All other components within normal limits  TROPONIN I (HIGH SENSITIVITY)   ____________________________________________  EKG   ____________________________________________  RADIOLOGY   No results found.  ____________________________________________    PROCEDURES  Procedure(s) performed:    Procedures    Medications  promethazine (PHENERGAN) injection 25 mg (has no administration in time range)  diphenhydrAMINE (BENADRYL) injection 50 mg (has no administration in time range)  dexamethasone (DECADRON) injection 10 mg (has no administration in time range)     ____________________________________________   INITIAL IMPRESSION / ASSESSMENT AND PLAN / ED COURSE  Pertinent labs & imaging results that were available during my care of the patient were reviewed by  me and considered in my medical decision making (see chart for details).  Review of the [place] CSRS was performed in accordance of the [place] prior to dispensing any controlled drugs.           Patient's diagnosis is consistent with  gastroenteritis.  Patient presents emergency department with 3-day history of nausea, vomiting, diarrhea.  Patient is also complaining of generalized headache with no deficits.  Patient does have a history of migraines.  When asked if this was different than normal patient states yes.  When asked to clarify patient states that she typically does not have nausea, vomiting, diarrhea with her headache.  I suspect that headache is secondary to gastroenteritis.  Patient had no acute findings on physical exam.  Labs are overall reassuring.  Patient will be given Phenergan, diphenhydramine and Decadron in the emergency department for her headache.  Patient is to have antiemetics, famotidine for GI complaint.  No indication for imaging at this time.  Patient is given return precautions.  Follow-up primary care as needed..  Patient is given ED precautions to return to the ED for any worsening or new symptoms.     ____________________________________________  FINAL CLINICAL IMPRESSION(S) / ED DIAGNOSES  Final diagnoses:  Gastroenteritis  Nausea vomiting and diarrhea  Acute nonintractable headache, unspecified headache type      NEW MEDICATIONS STARTED DURING THIS VISIT:  ED Discharge Orders         Ordered    ondansetron (ZOFRAN-ODT) 4 MG disintegrating tablet  Every 8 hours PRN,   Status:  Discontinued     01/26/19 1558    ranitidine (ZANTAC) 300 MG tablet  Daily,   Status:  Discontinued     01/26/19 1558    ondansetron (ZOFRAN-ODT) 4 MG disintegrating tablet  Every 8 hours PRN     01/26/19  1559    famotidine (PEPCID) 20 MG tablet  Daily     01/26/19 1559              This chart was dictated using voice recognition software/Dragon. Despite best efforts to  proofread, errors can occur which can change the meaning. Any change was purely unintentional.    Darletta Moll, PA-C 01/26/19 1600    Lilia Pro., MD 01/27/19 [phone]

## 2019-01-26 NOTE — ED Triage Notes (Signed)
First Nurse Note:  C/O nausea, vomiting and diarrhea x 3 days.

## 2019-01-26 NOTE — ED Provider Notes (Signed)
[facility] Emergency Department Provider Note  ____________________________________________   None    (approximate)   I have reviewed the triage vital signs and the nursing notes.   Patient has been triaged with a MSE exam performed by myself at a minimum. Based on symptoms and screening exam, patient may receive a more in-depth exam, labs, imaging as detailed below. Patients have been advised of this setting and exam type at the time of patient interview.   HISTORY  Chief Complaint Abdominal Pain   HPI Alyssa Perkins is a 68 y.o. female presents to the emergency department with a complaint of presents to the ED for evaluation of nausea, vomiting, and diarrhea. She notes symptoms for the last 3 days. She reports onset after cooking and eating pot roast. She describes pain to the epigastrium. She has a history of severe reflux, DM, and HTN.   Patient will receive a medical screening exam as detailed below.  Based off of this exam, more in depth exam, labs, imaging will be performed as needed for complaint.  Patient care will be eventually transferred to another provider in the emergency department for final exam, diagnosis and disposition.   Past Medical History:  Diagnosis Date  . Anginal pain ([place])   . Anxiety    not on any medication at this time  . Arthritis   . Asthma    "sports asthma or exercised induced"seen at [place] for primary  . Chronic back pain   . Chronic neck pain   . Complication of anesthesia    "difficulty waking up from surgery"  . Depression    has OCD, states counts constantly  . GERD (gastroesophageal reflux disease)   . Headache(784.0)    hx of migraines  . Hyperlipidemia   . IBS (irritable bowel syndrome)   . PONV (postoperative nausea and vomiting)   . RLS (restless legs syndrome)   . Sleep apnea   . Thyroid disease     Patient Active Problem List   Diagnosis Date Noted  . Moderate  episode of recurrent major depressive disorder ([place]) 04/09/2018  . BPPV (benign paroxysmal positional vertigo), right 10/15/2017  . Diabetic retinopathy ([place]) 02/26/2017  . Insomnia 02/21/2017  . Chronic headaches 02/21/2017  . OSA on CPAP 10/21/2016  . Depression 10/15/2016  . Chronic back pain 09/11/2016  . Chronic neck pain 09/11/2016  . RLS (restless legs syndrome) 07/08/2016  . Bilateral carpal tunnel syndrome 03/26/2016  . De Quervain's tenosynovitis, left 11/14/2015  . DM (diabetes mellitus), type 2, uncontrolled w/neurologic complication ([place]) 85/46/2703  . Hyperlipidemia associated with type 2 diabetes mellitus ([place]) 09/16/2014  . Hypertension 09/16/2014  . Acid reflux 09/16/2014  . Cervical radiculopathy 12/30/2011    Past Surgical History:  Procedure Laterality Date  . ABDOMINAL HYSTERECTOMY     also had oophorectomy  . AMPUTATION TOE Right 09/05/2017   Procedure: AMPUTATION TOE-RIGHT 2ND TOE;  Surgeon: Sharlotte Alamo, DPM;  Location: [facility] ORS;  Service: Podiatry;  Laterality: Right;  . ANTERIOR CERVICAL DECOMP/DISCECTOMY FUSION  01/02/2012   Procedure: ANTERIOR CERVICAL DECOMPRESSION/DISCECTOMY FUSION 2 LEVELS;  Surgeon: Melina Schools, MD;  Location: [place];  Service: Orthopedics;  Laterality: N/A;  ACDF C5-7  . BILATERAL CARPAL TUNNEL RELEASE    . CARDIAC CATHETERIZATION     done approx. 4- 5 years ago, Dr. Leanor Kail, does not see cardi. at this time.  . CHOLECYSTECTOMY    . DILATION AND CURETTAGE OF UTERUS     "  several"  . HERNIA REPAIR    . IRRIGATION AND DEBRIDEMENT FOOT  09/05/2017   Procedure: IRRIGATION AND DEBRIDEMENT FOOT;  Surgeon: Sharlotte Alamo, DPM;  Location: [facility] OR[facility];  Service: Podiatry;;  . KNEE SURGERY     1 partial left, 1 total left  . SPINE SURGERY      Prior to Admission medications   Medication Sig Start Date End Date Taking? Authorizing Provider  Blood Glucose Monitoring Suppl (ONETOUCH VERIO) w/Device KIT Use to check blood sugar up to 3 x  daily 10/28/18   [name], DO  diclofenac sodium (VOLTAREN) 1 % GEL Apply 2 g topically 3 (three) times daily as needed. Patient not taking: Reported on 11/05/2018 11/13/17   [name], DO  Elastic Bandages & Supports ([place]) [place] 1 each by Does not apply route daily. 11/14/15   Luciana Axe, NP  gabapentin (NEURONTIN) 400 MG capsule Take 1 capsule (400 mg total) by mouth 3 (three) times daily. Patient taking differently: Take 400 mg by mouth 2 (two) times daily.  04/09/18   Karamalegos, Devonne Doughty, DO  hydrOXYzine (ATARAX/VISTARIL) 25 MG tablet Take 1 tablet (25 mg total) by mouth daily as needed for itching. For itching 03/13/18   [name], DO  ibuprofen (ADVIL,MOTRIN) 200 MG tablet Take 600-800 mg by mouth every 6 (six) hours as needed for moderate pain.    [provider]  Insulin Glargine, 1 Unit Dial, (TOUJEO SOLOSTAR) 300 UNIT/ML SOPN Inject 65 Units into the skin daily. 12/25/18   Mikey College, NP  Insulin Pen Needle 31G X 5 MM MISC 1 each by Does not apply route daily. Use to inject Toujeo daily. Dx:E11.9 11/28/15   Krebs, Genevie Cheshire, NP  omeprazole (PRILOSEC) 20 MG capsule Take 1 capsule (20 mg total) by mouth daily. 03/13/18   Karamalegos, Devonne Doughty, DO  OneTouch Delica Lancets 26S MISC Use to check blood sugar up to 3 x daily 10/29/18   Parks Ranger, Devonne Doughty, DO  [facility] VERIO test strip Check blood sugar up to 3 x daily 10/29/18   Parks Ranger, Devonne Doughty, DO  quinapril (ACCUPRIL) 20 MG tablet Take 1 tablet (20 mg total) by mouth at bedtime. 03/13/18   Karamalegos, Devonne Doughty, DO  Semaglutide,0.25 or 0.5MG/DOS, (OZEMPIC, 0.25 OR 0.5 MG/DOSE,) 2 MG/1.5ML SOPN Inject 0.5 mg into the skin once a week. Patient not taking: Reported on 01/13/2019 01/06/19   Mikey College, NP  sertraline (ZOLOFT) 50 MG tablet Take 1 tablet (50 mg total) by mouth daily. 03/13/18   Karamalegos, Devonne Doughty, DO   simvastatin (ZOCOR) 40 MG tablet Take 1 tablet (40 mg total) by mouth at bedtime. Reported on 05/16/2015 Patient not taking: Reported on 11/05/2018 03/13/18   [name], DO  traMADol (ULTRAM) 50 MG tablet Take 1 tablet (50 mg total) by mouth daily as needed for moderate pain (headache). 11/04/18   Karamalegos, Devonne Doughty, DO  traZODone (DESYREL) 50 MG tablet Take 1 tablet (50 mg total) by mouth at bedtime. 03/13/18   [name], DO    Allergies Patient has no known allergies.  Family History  Problem Relation Age of Onset  . Heart disease Mother   . Osteoporosis Mother   . Breast cancer Mother        dx with ovarian ca 1st  . Ovarian cancer Mother        69's  . Osteoporosis Sister   . Diabetes Sister   . Breast  cancer Sister        early 24's no braca testing   . Cancer Brother        sinus, bone  . Osteoporosis Brother     Social History Social History   Tobacco Use  . Smoking status: Former Smoker    Packs/day: 0.50    Years: 15.00    Pack years: 7.50    Types: Cigarettes    Quit date: 07/17/1994    Years since quitting: 24.5  . Smokeless tobacco: Former Systems developer  . Tobacco comment: stopped 18 years ago  Substance Use Topics  . Alcohol use: No  . Drug use: No    Review of Systems Constitutional: Denies fever ENT: Denies nasal congestion/rhinorhea. Denies sore throat Cardiovascular: Denies chest pain. Respiratory: Denies cough. Denies shortness of breath/difficulty breathing Gastroenterology: Reports epigastric abdominal pain and nausea & vomiting Musculoskeletal: Denies for musculoskeletal pain Integumentary: Negative for rash. Neurological: No focal weakness nor numbness. ____________________________________________   PHYSICAL EXAM:  VITAL SIGNS: ED Triage Vitals [01/26/19 1431]  Enc Vitals Group     BP (!) 143/72     Pulse Rate 76     Resp 16     Temp 98.4 F (36.9 C)     Temp Source Oral     SpO2 96 %     Weight       Height      Head Circumference      Peak Flow      Pain Score 8     Pain Loc      Pain Edu?      Excl. in [place]?    Constitutional: Alert and oriented. Generally well appearing and in no acute distress. Eyes: Conjunctivae are normal.  Cardiovascular: Grossly normal heart sounds. Respiratory: Normal respiratory effort without significant tachypnea and no observed retractions. Lungs CTA Gastrointestinal: No significant visible abdominal wall findings. Discomfort localized to the epigastrium. Bowel sounds x4 quadrants. Non-tender to palpation. Musculoskeletal: No gross deformities of extremities. Neurologic:  Normal speech and language. No gross focal neurologic deficits are appreciated.  Skin:  Skin is warm, dry and intact. No rash noted. ____________________________________________   LABS (all labs ordered are listed, but only abnormal results are displayed)  Labs Reviewed  CBC WITH DIFFERENTIAL/PLATELET - Abnormal; Notable for the following components:      Result Value   WBC 10.8 (*)    RBC 5.30 (*)    All other components within normal limits  BASIC METABOLIC PANEL  URINALYSIS, COMPLETE (UACMP) WITH MICROSCOPIC  TROPONIN I (HIGH SENSITIVITY)   ___________________________________________  Official radiology report(s): No results found.  ____________________________________________   INITIAL IMPRESSION / MDM / ASSESSMENT AND PLAN / ED COURSE  As part of my medical decision making, I reviewed the following data within the [place] by EM attending. Notes from prior ED visits and [place] Controlled Substance Database  Clinical Impression: epigastric abdominal pain   Plan: Review labs and imaging. Final diagnosis and disposition pending results.   Patient has been screened based based on their arrival complaint, evaluated for an emergent condition, and at a minimum has received a medical screening exam.  At this time, patient will receive further  work-up as determined by medical screening exam.  Patient care will eventually be transferred to another provider in the emergency department for final diagnosis and disposition.  ____________________________________________  Note:  This document was prepared using Systems analyst and may include unintentional dictation errors.  Melvenia Needles, PA-C 01/26/19 1517    Merlyn Lot, MD 01/26/19 [phone]

## 2019-01-26 NOTE — ED Triage Notes (Signed)
Pt from home with multiple complaints. States vomiting and diarrhea x3 days. Also states chest pressure and head pressure. VSS

## 2019-01-28 ENCOUNTER — Ambulatory Visit: Payer: Medicare Other | Admitting: Pharmacist

## 2019-01-28 DIAGNOSIS — E1140 Type 2 diabetes mellitus with diabetic neuropathy, unspecified: Secondary | ICD-10-CM

## 2019-01-28 DIAGNOSIS — IMO0002 Reserved for concepts with insufficient information to code with codable children: Secondary | ICD-10-CM

## 2019-01-28 NOTE — Patient Instructions (Signed)
Thank you allowing the Chronic Care Management Team to be a part of your care! It was a pleasure speaking with you today!     CCM (Chronic Care Management) Team    Janci Minor RN, BSN Nurse Care Coordinator  [phone]   [name] PharmD  Clinical Pharmacist  [phone]   Eula Fried LCSW Clinical Social Worker [phone]  Visit Information  Goals Addressed            This Visit's Progress   . PharmD- I don't have a blood sugar meter (pt-stated)       Current Barriers:  [name] Knowledge deficits related to impact of diet on blood sugar and medications to manage blood sugar . Non Adherence to prescribed medication regimen . Financial Barriers . Lack of blood sugar results for clinical team  Pharmacist Clinical Goal(s):  [name] Over the next 30 days, patient will work with CM Pharmacist to address needs related to obtaining a blood glucose meter, medication adherence and medication regimen optimization  Interventions: . Perform chart review. Note Alyssa Perkins seen in [facility] ED on 11/10 for gastroenteritis. . Follow up with Alyssa Perkins regarding recent ED visit. Reports she had felt better after going to ED, but having nausea again today. . Briefly review discharge summary with patient. Address questions about medications that were prescribed for gastroenteritis symptoms o Reports she picked up prescriptions from pharmacy (famotidine and ondansetron), but then left these with daughter. Plans to take medications as directed when daughter home from work, today around 5 pm . Follow up regarding recent CBGs o Reports taking: - Ozempic 0.5 mg weekly on Sundays - Toujeo 76 units daily (as increased by Endocrinologist - per 10/29 telephone note) o Reports CBGs have been improved, running 110s-160s - This morning: 155 mg/dL - Denies any low blood sugars . Encourage patient to follow up with PCP office if needed for medical questions/concerns o Note patient due for follow  up appointment with PCP.  Patient Self Care Activities:  . Currently UNABLE to independently manage diabetes as evidenced by a recent A1C of 14% . Patient to check blood sugars as directed and keep log . Patient to attend provider appointments as scheduled o Next Endocrinology visit scheduled for: 11/23   Please see past updates related to this goal by clicking on the "Past Updates" button in the selected goal         The patient verbalized understanding of instructions provided today and declined a print copy of patient instruction materials.   The care management team will reach out to the patient again over the next 14 days.   [name], PharmD, [place] [employer] [phone]

## 2019-01-28 NOTE — Chronic Care Management (AMB) (Signed)
Chronic Care Management   Follow Up Note   01/28/2019 Name: Alyssa Perkins MRN: [id number] DOB: 15-Apr-1950  Referred by: [name], DO Reason for referral : Chronic Care Management (Patient Phone Call)   Alyssa Perkins is a 68 y.o. year old female who is a primary care patient of [name], DO. The CCM team was consulted for assistance with chronic disease management and care coordination needs.  Alyssa Perkins has a past medical history including but not limited to type 2 diabetes mellitus, hypertension, OSA, depression, hyperlipidemia, chronic back pain and acid reflux.  I reached out to Alyssa Perkins.   Review of patient status, including review of consultants reports, relevant laboratory and other test results, and collaboration with appropriate care team members and the patient's provider was performed as part of comprehensive patient evaluation and provision of chronic care management services.      Outpatient Encounter Medications as of 01/28/2019  Medication Sig Note  . Insulin Glargine, 1 Unit Dial, (TOUJEO SOLOSTAR) 300 UNIT/ML SOPN Inject 76 Units into the skin daily.   . Semaglutide,0.25 or 0.5MG/DOS, (OZEMPIC, 0.25 OR 0.5 MG/DOSE,) 2 MG/1.5ML SOPN Inject 0.5 mg into the skin once a week. 01/28/2019: Taking on Sundays  . Blood Glucose Monitoring Suppl (ONETOUCH VERIO) w/Device KIT Use to check blood sugar up to 3 x daily   . diclofenac sodium (VOLTAREN) 1 % GEL Apply 2 g topically 3 (three) times daily as needed. (Patient not taking: Reported on 11/05/2018)   . Elastic Bandages & Supports (MEDICAL COMPRESSION STOCKINGS) MISC 1 each by Does not apply route daily.   . famotidine (PEPCID) 20 MG tablet Take 1 tablet (20 mg total) by mouth daily.   [name] gabapentin (NEURONTIN) 400 MG capsule Take 1 capsule (400 mg total) by mouth 3 (three) times daily. (Patient taking differently: Take 400 mg by mouth 2 (two) times daily. )   .  hydrOXYzine (ATARAX/VISTARIL) 25 MG tablet Take 1 tablet (25 mg total) by mouth daily as needed for itching. For itching   . ibuprofen (ADVIL,MOTRIN) 200 MG tablet Take 600-800 mg by mouth every 6 (six) hours as needed for moderate pain.   . Insulin Pen Needle 31G X 5 MM MISC 1 each by Does not apply route daily. Use to inject Toujeo daily. Dx:E11.9   . omeprazole (PRILOSEC) 20 MG capsule Take 1 capsule (20 mg total) by mouth daily.   . ondansetron (ZOFRAN-ODT) 4 MG disintegrating tablet Take 1 tablet (4 mg total) by mouth every 8 (eight) hours as needed for nausea or vomiting.   Alyssa Perkins Delica Lancets 26V MISC Use to check blood sugar up to 3 x daily   . ONETOUCH VERIO test strip Check blood sugar up to 3 x daily   . quinapril (ACCUPRIL) 20 MG tablet Take 1 tablet (20 mg total) by mouth at bedtime.   . sertraline (ZOLOFT) 50 MG tablet Take 1 tablet (50 mg total) by mouth daily.   . simvastatin (ZOCOR) 40 MG tablet Take 1 tablet (40 mg total) by mouth at bedtime. Reported on 05/16/2015 (Patient not taking: Reported on 11/05/2018)   . traMADol (ULTRAM) 50 MG tablet Take 1 tablet (50 mg total) by mouth daily as needed for moderate pain (headache).   . traZODone (DESYREL) 50 MG tablet Take 1 tablet (50 mg total) by mouth at bedtime.   . [DISCONTINUED] Insulin Glargine, 1 Unit Dial, (TOUJEO SOLOSTAR) 300 UNIT/ML SOPN Inject 65 Units into the  skin daily.   . [DISCONTINUED] ranitidine (ZANTAC) 300 MG tablet Take 1 tablet (300 mg total) by mouth daily.    No facility-administered encounter medications on file as of 01/28/2019.     Goals Addressed            This Visit's Progress   . PharmD- I don't have a blood sugar meter (pt-stated)       Current Barriers:  [name] Knowledge deficits related to impact of diet on blood sugar and medications to manage blood sugar . Non Adherence to prescribed medication regimen . Financial Barriers . Lack of blood sugar results for clinical team  Pharmacist  Clinical Goal(s):  [name] Over the next 30 days, patient will work with CM Pharmacist to address needs related to obtaining a blood glucose meter, medication adherence and medication regimen optimization  Interventions: . Perform chart review. Note [name] seen in [facility] ED on 11/10 for gastroenteritis. . Follow up with [name] regarding recent ED visit. Reports she had felt better after going to ED, but having nausea again Perkins. . Briefly review discharge summary with patient. Address questions about medications that were prescribed for gastroenteritis symptoms o Reports she picked up prescriptions from pharmacy (famotidine and ondansetron), but then left these with daughter. Plans to take medications as directed when daughter home from work, Perkins around 5 pm . Follow up regarding recent CBGs o Reports taking: - Ozempic 0.5 mg weekly on Sundays - Toujeo 76 units daily (as increased by Endocrinologist - per 10/29 telephone note) o Reports CBGs have been improved, running 110s-160s - This morning: 155 mg/dL - Denies any low blood sugars . Encourage patient to follow up with PCP office if needed for medical questions/concerns o Note patient due for follow up appointment with PCP.  Patient Self Care Activities:  . Currently UNABLE to independently manage diabetes as evidenced by a recent A1C of 14% . Patient to check blood sugars as directed and keep log . Patient to attend provider appointments as scheduled o Next Endocrinology visit scheduled for: 11/23   Please see past updates related to this goal by clicking on the "Past Updates" button in the selected goal          Plan  The care management team will reach out to the patient again over the next 14 days.   [name], PharmD, [place] [employer] [phone]

## 2019-02-04 ENCOUNTER — Ambulatory Visit: Payer: Medicare Other | Admitting: *Deleted

## 2019-02-04 DIAGNOSIS — E1140 Type 2 diabetes mellitus with diabetic neuropathy, unspecified: Secondary | ICD-10-CM

## 2019-02-04 DIAGNOSIS — IMO0002 Reserved for concepts with insufficient information to code with codable children: Secondary | ICD-10-CM

## 2019-02-04 DIAGNOSIS — E1149 Type 2 diabetes mellitus with other diabetic neurological complication: Secondary | ICD-10-CM

## 2019-02-04 DIAGNOSIS — I10 Essential (primary) hypertension: Secondary | ICD-10-CM

## 2019-02-07 NOTE — Chronic Care Management (AMB) (Signed)
Chronic Care Management   Follow Up Note   02/04/2019 Name: Alyssa Perkins MRN: [id number] DOB: [birth year]  Referred by: [name], DO Reason for referral : Chronic Care Management (DM )   Alyssa Perkins is a 68 y.o. year old female who is a primary care patient of [name], DO. The CCM team was consulted for assistance with chronic disease management and care coordination needs.    Review of patient status, including review of consultants reports, relevant laboratory and other test results, and collaboration with appropriate care team members and the patient's provider was performed as part of comprehensive patient evaluation and provision of chronic care management services.    SDOH (Social Determinants of Health) screening performed today: Stress. See Care Plan for related entries.   Outpatient Encounter Medications as of 02/04/2019  Medication Sig Note  . Blood Glucose Monitoring Suppl (ONETOUCH VERIO) w/Device KIT Use to check blood sugar up to 3 x daily   . diclofenac sodium (VOLTAREN) 1 % GEL Apply 2 g topically 3 (three) times daily as needed. (Patient not taking: Reported on 11/05/2018)   . Elastic Bandages & Supports (MEDICAL COMPRESSION STOCKINGS) MISC 1 each by Does not apply route daily.   . famotidine (PEPCID) 20 MG tablet Take 1 tablet (20 mg total) by mouth daily.   [name] gabapentin (NEURONTIN) 400 MG capsule Take 1 capsule (400 mg total) by mouth 3 (three) times daily. (Patient taking differently: Take 400 mg by mouth 2 (two) times daily. )   . hydrOXYzine (ATARAX/VISTARIL) 25 MG tablet Take 1 tablet (25 mg total) by mouth daily as needed for itching. For itching   . ibuprofen (ADVIL,MOTRIN) 200 MG tablet Take 600-800 mg by mouth every 6 (six) hours as needed for moderate pain.   . Insulin Glargine, 1 Unit Dial, (TOUJEO SOLOSTAR) 300 UNIT/ML SOPN Inject 76 Units into the skin daily.   . Insulin Pen Needle 31G X 5 MM MISC 1 each by Does  not apply route daily. Use to inject Toujeo daily. Dx:E11.9   . omeprazole (PRILOSEC) 20 MG capsule Take 1 capsule (20 mg total) by mouth daily.   . ondansetron (ZOFRAN-ODT) 4 MG disintegrating tablet Take 1 tablet (4 mg total) by mouth every 8 (eight) hours as needed for nausea or vomiting.   [name] Lancets 67E MISC Use to check blood sugar up to 3 x daily   . ONETOUCH VERIO test strip Check blood sugar up to 3 x daily   . quinapril (ACCUPRIL) 20 MG tablet Take 1 tablet (20 mg total) by mouth at bedtime.   . Semaglutide,0.25 or 0.5MG/DOS, (OZEMPIC, 0.25 OR 0.5 MG/DOSE,) 2 MG/1.5ML SOPN Inject 0.5 mg into the skin once a week. 01/28/2019: Taking on Sundays  . sertraline (ZOLOFT) 50 MG tablet Take 1 tablet (50 mg total) by mouth daily.   . simvastatin (ZOCOR) 40 MG tablet Take 1 tablet (40 mg total) by mouth at bedtime. Reported on 05/16/2015 (Patient not taking: Reported on 11/05/2018)   . traMADol (ULTRAM) 50 MG tablet Take 1 tablet (50 mg total) by mouth daily as needed for moderate pain (headache).   . traZODone (DESYREL) 50 MG tablet Take 1 tablet (50 mg total) by mouth at bedtime.    No facility-administered encounter medications on file as of 02/04/2019.      Goals Addressed            This Visit's Progress   . RNCM- I need help with my  diabetes (pt-stated)       Current Barriers:  [name] Knowledge Deficits related to basic Diabetes pathophysiology and self care/management . Knowledge Deficits related to medications used for management of diabetes . Difficulty obtaining or cannot afford medications . Does not have glucometer to monitor blood sugar . Film/video editor . Transportation barriers  Case Manager Clinical Goal(s):  Over the next 120 days, patient will demonstrate improved adherence to prescribed treatment plan for diabetes self care/management as evidenced by:  . daily monitoring and recording of CBG  . adherence to ADA/ carb modified diet . adherence to  prescribed medication regimen  Interventions:  . Provided education to patient about basic DM disease process . Reviewed medications with patient and discussed importance of medication adherence . Discussed plans with patient for ongoing care management follow up and provided patient with direct contact information for care management team . Reviewed scheduled/upcoming provider appointments including: Endocrinology 11/23 @ 11:40 confirms she has transportation and plans to be there . Discussed recent visit to the ED . Patient states her sugars continue to be in the 200's, plans to take CBG meter to endocrinology visit on Monday. She feels high sugars are related to stress and snaking.  . No recent falls but states when she gets up at night she has to "bounce of the walls" to walk.  . No b/p readings at this time, reports not checking. . Stated she still needed to go to the eye doctor  Patient Self Care Activities:  . UNABLE to independently manage diabetes as evidenced by a recent A1C of 14% on 10/21/18  Please see past updates related to this goal by clicking on the "Past Updates" button in the selected goal           The care management team will reach out to the patient again over the next 60 days.  The patient has been provided with contact information for the care management team and has been advised to call with any health related questions or concerns.   Merlene Morse Minnie Shi RN, BSN Nurse Case Pharmacist, [facility]  [phone]) [employer]

## 2019-02-07 NOTE — Patient Instructions (Signed)
Thank you allowing the Chronic Care Management Team to be a part of your care! It was a pleasure speaking with you today!   CCM (Chronic Care Management) Team   Carynn Felling RN, BSN Nurse Care Coordinator  [phone]  [name] PharmD  Clinical Pharmacist  [phone]  Eula Fried LCSW Clinical Social Worker [phone]  Goals Addressed            This Visit's Progress   . RNCM- I need help with my diabetes (pt-stated)       Current Barriers:  [name] Knowledge Deficits related to basic Diabetes pathophysiology and self care/management . Knowledge Deficits related to medications used for management of diabetes . Difficulty obtaining or cannot afford medications . Does not have glucometer to monitor blood sugar . Film/video editor . Transportation barriers  Case Manager Clinical Goal(s):  Over the next 120 days, patient will demonstrate improved adherence to prescribed treatment plan for diabetes self care/management as evidenced by:  . daily monitoring and recording of CBG  . adherence to ADA/ carb modified diet . adherence to prescribed medication regimen  Interventions:  . Provided education to patient about basic DM disease process . Reviewed medications with patient and discussed importance of medication adherence . Discussed plans with patient for ongoing care management follow up and provided patient with direct contact information for care management team . Reviewed scheduled/upcoming provider appointments including: Endocrinology 11/23 @ 11:40 confirms she has transportation and plans to be there . Discussed recent visit to the ED . Patient states her sugars continue to be in the 200's, plans to take CBG meter to endocrinology visit on Monday. She feels high sugars are related to stress and snaking.  . No recent falls but states when she gets up at night she has to "bounce of the walls" to walk.  . No b/p readings at this time, reports not  checking. . Stated she still needed to go to the eye doctor  Patient Self Care Activities:  . UNABLE to independently manage diabetes as evidenced by a recent A1C of 14% on 10/21/18  Please see past updates related to this goal by clicking on the "Past Updates" button in the selected goal          The patient verbalized understanding of instructions provided today and declined a print copy of patient instruction materials.   The patient has been provided with contact information for the care management team and has been advised to call with any health related questions or concerns.

## 2019-02-08 DIAGNOSIS — Z794 Long term (current) use of insulin: Secondary | ICD-10-CM | POA: Diagnosis not present

## 2019-02-08 DIAGNOSIS — E785 Hyperlipidemia, unspecified: Secondary | ICD-10-CM | POA: Diagnosis not present

## 2019-02-08 DIAGNOSIS — E1169 Type 2 diabetes mellitus with other specified complication: Secondary | ICD-10-CM | POA: Diagnosis not present

## 2019-02-08 DIAGNOSIS — I10 Essential (primary) hypertension: Secondary | ICD-10-CM | POA: Diagnosis not present

## 2019-02-08 DIAGNOSIS — E1142 Type 2 diabetes mellitus with diabetic polyneuropathy: Secondary | ICD-10-CM | POA: Diagnosis not present

## 2019-02-08 DIAGNOSIS — E1165 Type 2 diabetes mellitus with hyperglycemia: Secondary | ICD-10-CM | POA: Diagnosis not present

## 2019-02-08 DIAGNOSIS — E669 Obesity, unspecified: Secondary | ICD-10-CM | POA: Diagnosis not present

## 2019-02-08 DIAGNOSIS — E1159 Type 2 diabetes mellitus with other circulatory complications: Secondary | ICD-10-CM | POA: Diagnosis not present

## 2019-02-10 ENCOUNTER — Ambulatory Visit: Payer: Medicare Other | Admitting: Pharmacist

## 2019-02-10 DIAGNOSIS — IMO0002 Reserved for concepts with insufficient information to code with codable children: Secondary | ICD-10-CM

## 2019-02-10 DIAGNOSIS — Z9989 Dependence on other enabling machines and devices: Secondary | ICD-10-CM

## 2019-02-10 DIAGNOSIS — G4733 Obstructive sleep apnea (adult) (pediatric): Secondary | ICD-10-CM

## 2019-02-10 DIAGNOSIS — E1140 Type 2 diabetes mellitus with diabetic neuropathy, unspecified: Secondary | ICD-10-CM

## 2019-02-10 NOTE — Chronic Care Management (AMB) (Signed)
Chronic Care Management   Follow Up Note   02/10/2019 Name: Alyssa Perkins MRN: [id number] DOB: 08/24/1950  Referred by: [name], DO Reason for referral : Chronic Care Management (Patient Phone Call)   Alyssa Perkins is a 68 y.o. year old female who is a primary care patient of [name], DO. The CCM team was consulted for assistance with chronic disease management and care coordination needs.  [name] has a past medical history including but not limited to type 2 diabetes mellitus, hypertension, OSA, depression, hyperlipidemia, chronic back pain and acid reflux.  I reached out to [name] by phone today.   Review of patient status, including review of consultants reports, relevant laboratory and other test results, and collaboration with appropriate care team members and the patient's provider was performed as part of comprehensive patient evaluation and provision of chronic care management services.      Outpatient Encounter Medications as of 02/10/2019  Medication Sig Note  . Insulin Glargine, 1 Unit Dial, (TOUJEO SOLOSTAR) 300 UNIT/ML SOPN Inject 76 Units into the skin daily.   . Blood Glucose Monitoring Suppl (ONETOUCH VERIO) w/Device KIT Use to check blood sugar up to 3 x daily   . diclofenac sodium (VOLTAREN) 1 % GEL Apply 2 g topically 3 (three) times daily as needed. (Patient not taking: Reported on 11/05/2018)   . Elastic Bandages & Supports (MEDICAL COMPRESSION STOCKINGS) MISC 1 each by Does not apply route daily.   . famotidine (PEPCID) 20 MG tablet Take 1 tablet (20 mg total) by mouth daily.   [name] gabapentin (NEURONTIN) 400 MG capsule Take 1 capsule (400 mg total) by mouth 3 (three) times daily. (Patient taking differently: Take 400 mg by mouth 2 (two) times daily. )   . hydrOXYzine (ATARAX/VISTARIL) 25 MG tablet Take 1 tablet (25 mg total) by mouth daily as needed for itching. For itching   . ibuprofen (ADVIL,MOTRIN) 200  MG tablet Take 600-800 mg by mouth every 6 (six) hours as needed for moderate pain.   . Insulin Pen Needle 31G X 5 MM MISC 1 each by Does not apply route daily. Use to inject Toujeo daily. Dx:E11.9   . omeprazole (PRILOSEC) 20 MG capsule Take 1 capsule (20 mg total) by mouth daily.   . ondansetron (ZOFRAN-ODT) 4 MG disintegrating tablet Take 1 tablet (4 mg total) by mouth every 8 (eight) hours as needed for nausea or vomiting.   [name] Lancets 00B MISC Use to check blood sugar up to 3 x daily   . ONETOUCH VERIO test strip Check blood sugar up to 3 x daily   . quinapril (ACCUPRIL) 20 MG tablet Take 1 tablet (20 mg total) by mouth at bedtime.   . Semaglutide,0.25 or 0.5MG/DOS, (OZEMPIC, 0.25 OR 0.5 MG/DOSE,) 2 MG/1.5ML SOPN Inject 0.5 mg into the skin once a week. 01/28/2019: Taking on Sundays  . sertraline (ZOLOFT) 50 MG tablet Take 1 tablet (50 mg total) by mouth daily.   . simvastatin (ZOCOR) 40 MG tablet Take 1 tablet (40 mg total) by mouth at bedtime. Reported on 05/16/2015 (Patient not taking: Reported on 11/05/2018)   . traMADol (ULTRAM) 50 MG tablet Take 1 tablet (50 mg total) by mouth daily as needed for moderate pain (headache).   . traZODone (DESYREL) 50 MG tablet Take 1 tablet (50 mg total) by mouth at bedtime.   . [DISCONTINUED] ranitidine (ZANTAC) 300 MG tablet Take 1 tablet (300 mg total) by mouth daily.  No facility-administered encounter medications on file as of 02/10/2019.     Goals Addressed            This Visit's Progress   . PharmD- I don't have a blood sugar meter (pt-stated)       Current Barriers:  [name] Knowledge deficits related to impact of diet on blood sugar and medications to manage blood sugar . Non Adherence to prescribed medication regimen . Financial Barriers . Lack of blood sugar results for clinical team  Pharmacist Clinical Goal(s):  [name] Over the next 30 days, patient will work with CM Pharmacist to address needs related to obtaining a blood  glucose meter, medication adherence and medication regimen optimization  Interventions: . Perform chart review. Note patient kept appointment with Endocrinologist on 11/23 o Endocrinologist advised patient to: - Continue Ozempic INCREASE from 0.25 mg to 0.5 mg weekly on Friday - Continue Toujeo 76 units each morning - Add Jardiance 25 mg daily - Check blood sugars fasting each morning and again before bed. Please bring your meter to all clinic visits . Counsel on importance of blood sugar control and monitoring o Review instructions from Endocrinologist with patient - [name] confirms taking Toujeo as directed and planning to increase Ozempic as directed on Friday, 11/27 - States waiting on Jardiance to arrive from [facility] order pharmacy - Reports has been checking fasting CBGs, but not at night before bed . States will start checking at bedtime as well as directed o Reports fasting CBG today in 250s. Attributes result to having a late supper with large carbohydrate portion size . Counsel patient on importance of well balanced meal with limiting carbohydrate portion sizes . Encourage patient to follow up for reschedule of PCP office visit, particularly to address concerns about headaches and sleep apnea o Patient states that she will call next week . Encourage patient to continue using weekly pillbox to aid with medication adherence.  . Encourage patient to call for medication refills   Patient Self Care Activities:  . Currently UNABLE to independently manage diabetes as evidenced by a recent A1C of 14% . Patient to check blood sugars as directed and keep log . Patient to attend provider appointments as scheduled   Please see past updates related to this goal by clicking on the "Past Updates" button in the selected goal         Plan  The care management team will reach out to the patient again over the next 30 days.   [name], PharmD, [place] [employer] [phone]

## 2019-02-10 NOTE — Patient Instructions (Signed)
Thank you allowing the Chronic Care Management Team to be a part of your care! It was a pleasure speaking with you today!     CCM (Chronic Care Management) Team    Janci Minor RN, BSN Nurse Care Coordinator  [phone]   [name] PharmD  Clinical Pharmacist  [phone]   Eula Fried LCSW Clinical Social Worker [phone]  Visit Information  Goals Addressed            This Visit's Progress   . PharmD- I don't have a blood sugar meter (pt-stated)       Current Barriers:  Alyssa Kitchen Knowledge deficits related to impact of diet on blood sugar and medications to manage blood sugar . Non Adherence to prescribed medication regimen . Financial Barriers . Lack of blood sugar results for clinical team  Pharmacist Clinical Goal(s):  Alyssa Kitchen Over the next 30 days, patient will work with CM Pharmacist to address needs related to obtaining a blood glucose meter, medication adherence and medication regimen optimization  Interventions: . Perform chart review. Note patient kept appointment with Endocrinologist on 11/23 . Counsel on importance of blood sugar control and monitoring o Review instructions from Endocrinologist with patient - Ms. Perkins confirms taking Toujeo as directed and planning to increase Ozempic as directed on Friday, 11/27 - States waiting on Jardiance to arrive from [facility] order pharmacy - Reports has been checking fasting CBGs, but not at night before bed . States will start checking at bedtime as well as directed o Reports fasting CBG today in 250s. Attributes result to having a late supper with large carbohydrate portion size . Counsel patient on importance of well balanced meal with limiting carbohydrate portion sizes . Encourage patient to follow up for reschedule of PCP office visit, particularly to address concerns about headaches and sleep apnea o Patient states that she will call next week . Encourage patient to continue using weekly pillbox to  aid with medication adherence.  . Encourage patient to call for medication refills   Patient Self Care Activities:  . Currently UNABLE to independently manage diabetes as evidenced by a recent A1C of 14% . Patient to check blood sugars as directed and keep log . Patient to attend provider appointments as scheduled   Please see past updates related to this goal by clicking on the "Past Updates" button in the selected goal         The patient verbalized understanding of instructions provided today and declined a print copy of patient instruction materials.   The care management team will reach out to the patient again over the next 30 days.   [name], PharmD, [place] [employer] [phone]

## 2019-02-17 ENCOUNTER — Other Ambulatory Visit: Payer: Self-pay | Admitting: Family Medicine

## 2019-02-17 DIAGNOSIS — E1140 Type 2 diabetes mellitus with diabetic neuropathy, unspecified: Secondary | ICD-10-CM

## 2019-02-17 DIAGNOSIS — F3289 Other specified depressive episodes: Secondary | ICD-10-CM

## 2019-02-17 DIAGNOSIS — I10 Essential (primary) hypertension: Secondary | ICD-10-CM

## 2019-02-17 DIAGNOSIS — G5603 Carpal tunnel syndrome, bilateral upper limbs: Secondary | ICD-10-CM

## 2019-02-17 DIAGNOSIS — M5412 Radiculopathy, cervical region: Secondary | ICD-10-CM

## 2019-02-17 DIAGNOSIS — E1169 Type 2 diabetes mellitus with other specified complication: Secondary | ICD-10-CM

## 2019-02-17 DIAGNOSIS — E1142 Type 2 diabetes mellitus with diabetic polyneuropathy: Secondary | ICD-10-CM

## 2019-02-17 DIAGNOSIS — IMO0002 Reserved for concepts with insufficient information to code with codable children: Secondary | ICD-10-CM

## 2019-02-17 MED ORDER — GABAPENTIN 400 MG PO CAPS: 400.0000 mg | ORAL_CAPSULE | Freq: Three times a day (TID) | ORAL | 1 refills | Status: DC

## 2019-02-17 MED ORDER — INSULIN PEN NEEDLE 31G X 5 MM MISC: 1.0000 | Freq: Every day | 0 refills | Status: AC

## 2019-02-17 MED ORDER — TOUJEO SOLOSTAR 300 UNIT/ML ~~LOC~~ SOPN: 76.0000 [IU] | PEN_INJECTOR | Freq: Every day | SUBCUTANEOUS | 3 refills | Status: DC

## 2019-02-17 MED ORDER — SIMVASTATIN 40 MG PO TABS: 40.0000 mg | ORAL_TABLET | Freq: Every day | ORAL | 3 refills | Status: DC

## 2019-02-17 MED ORDER — HYDROXYZINE HCL 25 MG PO TABS: 25.0000 mg | ORAL_TABLET | Freq: Every day | ORAL | 1 refills | Status: DC | PRN

## 2019-02-17 MED ORDER — SERTRALINE HCL 50 MG PO TABS: 50.0000 mg | ORAL_TABLET | Freq: Every day | ORAL | 3 refills | Status: DC

## 2019-02-17 MED ORDER — TRAZODONE HCL 50 MG PO TABS: 50.0000 mg | ORAL_TABLET | Freq: Every day | ORAL | 3 refills | Status: DC

## 2019-02-17 MED ORDER — QUINAPRIL HCL 20 MG PO TABS: 20.0000 mg | ORAL_TABLET | Freq: Every day | ORAL | 3 refills | Status: DC

## 2019-02-17 NOTE — Telephone Encounter (Signed)
Pt  Called requesting refill on     Quinapril 20 MG  Sertraline  50 mg  Trazodone  Omeprazole  20 MG  Toujeo    Gabapentin  400 MG  Hydroxyzine  25 MG    [facility] ORDER

## 2019-03-09 ENCOUNTER — Telehealth: Payer: Self-pay

## 2019-03-09 ENCOUNTER — Ambulatory Visit: Payer: Self-pay | Admitting: Pharmacist

## 2019-03-09 NOTE — Chronic Care Management (AMB) (Signed)
  Chronic Care Management   Follow Up Note   03/09/2019 Name: JINAN NORTHWAY MRN: [id number] DOB: 02-12-1951  Referred by: [name], DO Reason for referral : Chronic Care Management (Patient Phone Call)   VYLETTE WORK is a 68 y.o. year old female who is a primary care patient of [name], DO. The CCM team was consulted for assistance with chronic disease management and care coordination needs.    Was unable to reach patient via telephone today. Patient does not answer and am unable to leave a message as no voicemail picks up.  Plan  The care management team will reach out to the patient again over the next 30 days.   [name], PharmD, [place] [employer] [phone]

## 2019-03-15 ENCOUNTER — Telehealth: Payer: Self-pay

## 2019-03-31 ENCOUNTER — Other Ambulatory Visit: Payer: Self-pay

## 2019-03-31 ENCOUNTER — Ambulatory Visit (INDEPENDENT_AMBULATORY_CARE_PROVIDER_SITE_OTHER): Payer: Medicare Other | Admitting: Family Medicine

## 2019-03-31 ENCOUNTER — Encounter: Payer: Self-pay | Admitting: Family Medicine

## 2019-03-31 DIAGNOSIS — R630 Anorexia: Secondary | ICD-10-CM

## 2019-03-31 DIAGNOSIS — Z20822 Contact with and (suspected) exposure to covid-19: Secondary | ICD-10-CM | POA: Diagnosis not present

## 2019-03-31 DIAGNOSIS — R059 Cough, unspecified: Secondary | ICD-10-CM

## 2019-03-31 DIAGNOSIS — R110 Nausea: Secondary | ICD-10-CM | POA: Diagnosis not present

## 2019-03-31 DIAGNOSIS — R509 Fever, unspecified: Secondary | ICD-10-CM | POA: Diagnosis not present

## 2019-03-31 DIAGNOSIS — R0602 Shortness of breath: Secondary | ICD-10-CM

## 2019-03-31 DIAGNOSIS — R05 Cough: Secondary | ICD-10-CM

## 2019-03-31 NOTE — Patient Instructions (Addendum)
You may have coronavirus / [place] Testing Information  COVID-19 Testing By Appointment Only  Online scheduling can be done online at [email or web address] or by texting "COVID" to [phone].  Test result may take 2-7 days to result. You will be notified by MyChart or by Phone.  Phone: [phone] [facility] contact, can inquire about status of test result)  If negative test - they will call you with result. If abnormal or positive test you will be notified as well and our office will contact you to help further with treatment plan.  May take Tylenol as needed for aches pains and fever. Prefer to avoid Ibuprofen if can help it, to avoid complication from virus.  REQUIRED self quarantine to [place] - advised to avoid all exposure with others while during treatment. Should continue to quarantine for up to 7-14 days, pending resolution of symptoms, if symptoms resolve by 7 days and is afebrile >3 days - may STOP self quarantine at that time.  If symptoms do not resolve or significantly improve OR if WORSENING - fever / cough - or worsening shortness of breath - then should contact us and seek advice on next steps in treatment at home vs where/when to seek care at Urgent Care or Hospital ED for further intervention    Please schedule a Follow-up Appointment to: Return in about 1 week (around 04/07/2019), or if symptoms worsen or fail to improve, for suspected covid.  If you have any other questions or concerns, please feel free to call the office or send a message through [place]. You may also schedule an earlier appointment if necessary.  Additionally, you may be receiving a survey about your experience at our office within a few days to 1 week by e-mail or mail. We value your feedback.  Nobie Putnam, DO [place]

## 2019-03-31 NOTE — Progress Notes (Signed)
Virtual Visit via Telephone The purpose of this virtual visit is to provide medical care while limiting exposure to the novel coronavirus (COVID19) for both patient and office staff.  Consent was obtained for phone visit:  Yes.   Answered questions that patient had about telehealth interaction:  Yes.   I discussed the limitations, risks, security and privacy concerns of performing an evaluation and management service by telephone. I also discussed with the patient that there may be a patient responsible charge related to this service. The patient expressed understanding and agreed to proceed.  Patient Location: Home Provider Location: [facility])  ---------------------------------------------------------------------- Chief Complaint  Patient presents with  . Abdominal Pain    nausea, intermittent sharp abdominal pain, fever w/ fever blisters, coughing, chills, SOB, dry mouth, lack of taste and severe headache x 1 wk    S: Reviewed CMA documentation. I have called patient and gathered additional HPI as follows:  Abdominal Pain / Chest Tightness / Cough / Dyspnea Dehydration / Poor PO intake nausea vomiting She admits exposure to COVID with family member recently, has not been tested She has had poor PO intake, has nasuea medicine taking but not always relieving Admits dry mouth, trying to take in plenty of fluids Admits headache Admits loss of taste  Admits low grade fever did not measure, felt chills Denies any sinus pain or pressure, diarrhea  Past Medical History:  Diagnosis Date  . Anginal pain ([place])   . Anxiety    not on any medication at this time  . Arthritis   . Asthma    "sports asthma or exercised induced"seen at [place] for primary  . Chronic back pain   . Chronic neck pain   . Complication of anesthesia    "difficulty waking up from surgery"  . Depression    has OCD, states counts constantly  . GERD (gastroesophageal  reflux disease)   . Headache(784.0)    hx of migraines  . Hyperlipidemia   . IBS (irritable bowel syndrome)   . PONV (postoperative nausea and vomiting)   . RLS (restless legs syndrome)   . Sleep apnea   . Thyroid disease    Social History   Tobacco Use  . Smoking status: Former Smoker    Packs/day: 0.50    Years: 15.00    Pack years: 7.50    Types: Cigarettes    Quit date: 07/17/1994    Years since quitting: 24.7  . Smokeless tobacco: Former Systems developer  . Tobacco comment: stopped 18 years ago  Substance Use Topics  . Alcohol use: No  . Drug use: No    Current Outpatient Medications:  .  Blood Glucose Monitoring Suppl (ONETOUCH VERIO) w/Device KIT, Use to check blood sugar up to 3 x daily, Disp: 1 kit, Rfl: 0 .  Elastic Bandages & Supports (MEDICAL COMPRESSION STOCKINGS) MISC, 1 each by Does not apply route daily., Disp: 2 each, Rfl: 0 .  famotidine (PEPCID) 20 MG tablet, Take 1 tablet (20 mg total) by mouth daily., Disp: 14 tablet, Rfl: 0 .  gabapentin (NEURONTIN) 400 MG capsule, Take 1 capsule (400 mg total) by mouth 3 (three) times daily., Disp: 270 capsule, Rfl: 1 .  hydrOXYzine (ATARAX/VISTARIL) 25 MG tablet, Take 1 tablet (25 mg total) by mouth daily as needed for itching. For itching, Disp: 90 tablet, Rfl: 1 .  Insulin Glargine, 1 Unit Dial, (TOUJEO SOLOSTAR) 300 UNIT/ML SOPN, Inject 76 Units into the skin daily., Disp: 15  mL, Rfl: 3 .  Insulin Pen Needle 31G X 5 MM MISC, 1 each by Does not apply route daily. Use to inject Toujeo daily. Dx:E11.9, Disp: 100 each, Rfl: 0 .  omeprazole (PRILOSEC) 20 MG capsule, Take 1 capsule (20 mg total) by mouth daily., Disp: 90 capsule, Rfl: 3 .  ondansetron (ZOFRAN-ODT) 4 MG disintegrating tablet, Take 1 tablet (4 mg total) by mouth every 8 (eight) hours as needed for nausea or vomiting., Disp: 20 tablet, Rfl: 0 .  OneTouch Delica Lancets 49F MISC, Use to check blood sugar up to 3 x daily, Disp: 300 each, Rfl: 2 .  ONETOUCH VERIO test strip,  Check blood sugar up to 3 x daily, Disp: 300 each, Rfl: 2 .  Semaglutide,0.25 or 0.5MG/DOS, (OZEMPIC, 0.25 OR 0.5 MG/DOSE,) 2 MG/1.5ML SOPN, Inject 0.5 mg into the skin once a week., Disp: 3 pen, Rfl: 0 .  sertraline (ZOLOFT) 50 MG tablet, Take 1 tablet (50 mg total) by mouth daily., Disp: 90 tablet, Rfl: 3 .  simvastatin (ZOCOR) 40 MG tablet, Take 1 tablet (40 mg total) by mouth at bedtime. Reported on 05/16/2015, Disp: 90 tablet, Rfl: 3 .  traZODone (DESYREL) 50 MG tablet, Take 1 tablet (50 mg total) by mouth at bedtime., Disp: 90 tablet, Rfl: 3 .  diclofenac sodium (VOLTAREN) 1 % GEL, Apply 2 g topically 3 (three) times daily as needed. (Patient not taking: Reported on 03/31/2019), Disp: 100 g, Rfl: 3 .  ibuprofen (ADVIL,MOTRIN) 200 MG tablet, Take 600-800 mg by mouth every 6 (six) hours as needed for moderate pain., Disp: , Rfl:  .  quinapril (ACCUPRIL) 20 MG tablet, Take 1 tablet (20 mg total) by mouth at bedtime. (Patient not taking: Reported on 03/31/2019), Disp: 90 tablet, Rfl: 3 .  traMADol (ULTRAM) 50 MG tablet, Take 1 tablet (50 mg total) by mouth daily as needed for moderate pain (headache). (Patient not taking: Reported on 03/31/2019), Disp: 30 tablet, Rfl: 1  Depression screen [facility] 2/9 03/31/2019 10/21/2018 04/09/2018  Decreased Interest 3 3 1   Down, Depressed, Hopeless 1 2 2   PHQ - 2 Score 4 5 3   Altered sleeping 3 1 1   Tired, decreased energy 3 1 3   Change in appetite 1 2 2   Feeling bad or failure about yourself  0 1 1  Trouble concentrating 3 1 1   Moving slowly or fidgety/restless 0 0 2  Suicidal thoughts 0 1 0  PHQ-9 Score 14 12 13   Difficult doing work/chores Somewhat difficult Somewhat difficult Somewhat difficult  Some recent data might be hidden    GAD 7 : Generalized Anxiety Score 10/21/2018 04/09/2018 10/15/2017 01/15/2017  Nervous, Anxious, on Edge 2 1 2 2   Control/stop worrying 3 2 2 3   Worry too much - different things 2 1 3 3   Trouble relaxing 2 1 3 2   Restless 2 1 2 2    Easily annoyed or irritable 2 2 2 3   Afraid - awful might happen 2 2 1 2   Total GAD 7 Score 15 10 15 17   Anxiety Difficulty Somewhat difficult Not difficult at all Somewhat difficult Somewhat difficult    -------------------------------------------------------------------------- O: No physical exam performed due to remote telephone encounter.  Lab results reviewed.  Recent Results (from the past 2160 hour(s))  Basic metabolic panel     Status: Abnormal   Collection Time: 01/26/19  2:33 PM  Result Value Ref Range   Sodium 137 135 - 145 mmol/L   Potassium 3.9 3.5 - 5.1 mmol/L  Chloride 103 98 - 111 mmol/L   CO2 23 22 - 32 mmol/L   Glucose, Bld 119 (H) 70 - 99 mg/dL   BUN 12 8 - 23 mg/dL   Creatinine, Ser 0.82 0.44 - 1.00 mg/dL   Calcium 8.8 (L) 8.9 - 10.3 mg/dL   GFR calc non Af Amer >60 >60 mL/min   GFR calc Af Amer >60 >60 mL/min   Anion gap 11 5 - 15    Comment: Performed at [facility], [address] [place], [place] [address]  CBC with Differential     Status: Abnormal   Collection Time: 01/26/19  2:33 PM  Result Value Ref Range   WBC 10.8 (H) 4.0 - 10.5 K/uL   RBC 5.30 (H) 3.87 - 5.11 MIL/uL   Hemoglobin 14.6 12.0 - 15.0 g/dL   HCT 44.8 36.0 - 46.0 %   MCV 84.5 80.0 - 100.0 fL   MCH 27.5 26.0 - 34.0 pg   MCHC 32.6 30.0 - 36.0 g/dL   RDW 14.0 11.5 - 15.5 %   Platelets 271 150 - 400 K/uL   nRBC 0.0 0.0 - 0.2 %   Neutrophils Relative % 68 %   Neutro Abs 7.2 1.7 - 7.7 K/uL   Lymphocytes Relative 24 %   Lymphs Abs 2.6 0.7 - 4.0 K/uL   Monocytes Relative 5 %   Monocytes Absolute 0.6 0.1 - 1.0 K/uL   Eosinophils Relative 3 %   Eosinophils Absolute 0.3 0.0 - 0.5 K/uL   Basophils Relative 0 %   Basophils Absolute 0.0 0.0 - 0.1 K/uL   Immature Granulocytes 0 %   Abs Immature Granulocytes 0.04 0.00 - 0.07 K/uL    Comment: Performed at [facility], [address] [place], [place] [address]  Urinalysis, Complete w Microscopic     Status:  Abnormal   Collection Time: 01/26/19  2:33 PM  Result Value Ref Range   Color, Urine YELLOW (A) YELLOW   APPearance CLEAR (A) CLEAR   Specific Gravity, Urine 1.009 1.005 - 1.030   pH 7.0 5.0 - 8.0   Glucose, UA NEGATIVE NEGATIVE mg/dL   Hgb urine dipstick NEGATIVE NEGATIVE   Bilirubin Urine NEGATIVE NEGATIVE   Ketones, ur NEGATIVE NEGATIVE mg/dL   Protein, ur NEGATIVE NEGATIVE mg/dL   Nitrite NEGATIVE NEGATIVE   Leukocytes,Ua NEGATIVE NEGATIVE   RBC / HPF 0-5 0 - 5 RBC/hpf   WBC, UA 0-5 0 - 5 WBC/hpf   Bacteria, UA RARE (A) NONE SEEN   Squamous Epithelial / LPF 6-10 0 - 5   Mucus PRESENT     Comment: Performed at [facility], [address] [place], [place] [address]  Troponin I (High Sensitivity)     Status: None   Collection Time: 01/26/19  2:33 PM  Result Value Ref Range   Troponin I (High Sensitivity) 4 <18 ng/L    Comment: (NOTE) Elevated high sensitivity troponin I (hsTnI) values and significant  changes across serial measurements may suggest ACS but many other  chronic and acute conditions are known to elevate hsTnI results.  Refer to the "Links" section for chest pain algorithms and additional  guidance. Performed at [facility], [address] [place], [place] [address]     -------------------------------------------------------------------------- A&P:  Problem List Items Addressed This Visit    None    Visit Diagnoses    Suspected COVID-19 virus infection    -  Primary   Fever and chills  Poor appetite       Nausea       Cough       Shortness of breath         Constellation of non-specific symptoms, seems most consistent with COVID19 suspected, in setting of known exposure recently. Poor PO intake, risk of dehydration  Emphasized importance of getting COVID test and evaluated  Recommended that she go to hospital ED given she is high risk patient with other comorbid conditions and multiple constellation of symptoms, hard to  assess virtually at this time.  She may need IV fluid rehydration if ultimately dehydrated, further work up as well and can get expedited covid19 testing.  If she gets COVID19 test through [facility] but does not go to hospital, she was asked to follow-up promptly to ensure her symptoms are improving.  Offer zofran for nausea, she has currently. She can contact us back if any worsening or needs other treatments. Declined other treatment, instead she agreed to go to get tested / treated  No orders of the defined types were placed in this encounter.   Follow-up: As needed  Patient verbalizes understanding with the above medical recommendations including the limitation of remote medical advice.  Specific follow-up and call-back criteria were given for patient to follow-up or seek medical care more urgently if needed.   - Time spent in direct consultation with patient on phone: 9 minutes   Nobie Putnam, [facility] 03/31/2019, 11:03 AM

## 2019-04-01 ENCOUNTER — Ambulatory Visit: Payer: Medicare Other | Attending: Internal Medicine

## 2019-04-01 DIAGNOSIS — Z20822 Contact with and (suspected) exposure to covid-19: Secondary | ICD-10-CM | POA: Diagnosis not present

## 2019-04-02 LAB — NOVEL CORONAVIRUS, NAA: SARS-CoV-2, NAA: NOT DETECTED

## 2019-04-08 ENCOUNTER — Ambulatory Visit (INDEPENDENT_AMBULATORY_CARE_PROVIDER_SITE_OTHER): Payer: Medicare Other | Admitting: Pharmacist

## 2019-04-08 DIAGNOSIS — E785 Hyperlipidemia, unspecified: Secondary | ICD-10-CM

## 2019-04-08 DIAGNOSIS — E1165 Type 2 diabetes mellitus with hyperglycemia: Secondary | ICD-10-CM

## 2019-04-08 DIAGNOSIS — E1169 Type 2 diabetes mellitus with other specified complication: Secondary | ICD-10-CM | POA: Diagnosis not present

## 2019-04-08 DIAGNOSIS — IMO0002 Reserved for concepts with insufficient information to code with codable children: Secondary | ICD-10-CM

## 2019-04-08 DIAGNOSIS — I10 Essential (primary) hypertension: Secondary | ICD-10-CM

## 2019-04-08 DIAGNOSIS — E1149 Type 2 diabetes mellitus with other diabetic neurological complication: Secondary | ICD-10-CM

## 2019-04-08 NOTE — Chronic Care Management (AMB) (Signed)
Chronic Care Management   Follow Up Note   04/08/2019 Name: Alyssa Perkins MRN: [id number] DOB: 1950/03/19  Referred by: [name], DO Reason for referral : Chronic Care Management (Patient Phone Call)   Alyssa Perkins is a 69 y.o. year old female who is a primary care patient of [name], DO. The CCM team was consulted for assistance with chronic disease management and care coordination needs.  Alyssa Perkins has a past medical history including but not limited to type 2 diabetes mellitus, hypertension, OSA, depression, hyperlipidemia, chronic back pain and acid reflux.  I reached out to Alyssa Perkins by phone today.   Review of patient status, including review of consultants reports, relevant laboratory and other test results, and collaboration with appropriate care team members and the patient's provider was performed as part of comprehensive patient evaluation and provision of chronic care management services.     Outpatient Encounter Medications as of 04/08/2019  Medication Sig Note  . empagliflozin (JARDIANCE) 25 MG TABS tablet Take 25 mg by mouth daily with breakfast.   . Insulin Glargine, 1 Unit Dial, (TOUJEO SOLOSTAR) 300 UNIT/ML SOPN Inject 76 Units into the skin daily.   . quinapril (ACCUPRIL) 20 MG tablet Take 1 tablet (20 mg total) by mouth at bedtime.   . Semaglutide,0.25 or 0.5MG/DOS, (OZEMPIC, 0.25 OR 0.5 MG/DOSE,) 2 MG/1.5ML SOPN Inject 0.5 mg into the skin once a week. 01/28/2019: Taking on Sundays  . simvastatin (ZOCOR) 40 MG tablet Take 1 tablet (40 mg total) by mouth at bedtime. Reported on 05/16/2015   . Blood Glucose Monitoring Suppl (ONETOUCH VERIO) w/Device KIT Use to check blood sugar up to 3 x daily   . diclofenac sodium (VOLTAREN) 1 % GEL Apply 2 g topically 3 (three) times daily as needed. (Patient not taking: Reported on 03/31/2019)   . Elastic Bandages & Supports (MEDICAL COMPRESSION STOCKINGS) MISC 1 each by Does not  apply route daily.   . famotidine (PEPCID) 20 MG tablet Take 1 tablet (20 mg total) by mouth daily.   [name] gabapentin (NEURONTIN) 400 MG capsule Take 1 capsule (400 mg total) by mouth 3 (three) times daily.   . hydrOXYzine (ATARAX/VISTARIL) 25 MG tablet Take 1 tablet (25 mg total) by mouth daily as needed for itching. For itching   . ibuprofen (ADVIL,MOTRIN) 200 MG tablet Take 600-800 mg by mouth every 6 (six) hours as needed for moderate pain.   . Insulin Pen Needle 31G X 5 MM MISC 1 each by Does not apply route daily. Use to inject Toujeo daily. Dx:E11.9   . omeprazole (PRILOSEC) 20 MG capsule Take 1 capsule (20 mg total) by mouth daily.   . ondansetron (ZOFRAN-ODT) 4 MG disintegrating tablet Take 1 tablet (4 mg total) by mouth every 8 (eight) hours as needed for nausea or vomiting.   [name] Lancets 34Y MISC Use to check blood sugar up to 3 x daily   . ONETOUCH VERIO test strip Check blood sugar up to 3 x daily   . sertraline (ZOLOFT) 50 MG tablet Take 1 tablet (50 mg total) by mouth daily.   . traMADol (ULTRAM) 50 MG tablet Take 1 tablet (50 mg total) by mouth daily as needed for moderate pain (headache). (Patient not taking: Reported on 03/31/2019)   . traZODone (DESYREL) 50 MG tablet Take 1 tablet (50 mg total) by mouth at bedtime.   . [DISCONTINUED] ranitidine (ZANTAC) 300 MG tablet Take 1 tablet (300 mg total) by  mouth daily.    No facility-administered encounter medications on file as of 04/08/2019.    Goals Addressed            This Visit's Progress   . PharmD- I don't have a blood sugar meter (pt-stated)       Current Barriers:  [name] Knowledge deficits related to impact of diet on blood sugar and medications to manage blood sugar . Non Adherence to prescribed medication regimen . Financial Barriers . Lack of blood sugar results for clinical team  Pharmacist Clinical Goal(s):  [name] Over the next 30 days, patient will work with CM Pharmacist to address needs related to  obtaining a blood glucose meter, medication adherence and medication regimen optimization  Interventions: . Perform chart review. Note patient had virtual appointment with PCP on 1/13 for symptoms including: abdominal pain, chest tightness, cough and dyspnea o Note COVID-19 testing completed on 1/14; result: not detected . Follow up with patient regarding recent illness. Alyssa Perkins reports significant improvement in symptoms . Counsel on importance of blood sugar control and monitoring o Alyssa Perkins reports taking: - Toujeo 76 units each morning - Jardiance 25 mg once daily - States taking Ozempic weekly, but unable to confirm current dose . Review instructions from last Endocrinologist appointment (11/23) with patient. States that she will review and confirm that she is taking Ozempic 0.5 mg weekly as directed - Reports has been checking fasting CBGs, but not at night before bed . States will start checking at bedtime as well as directed o Reports fasting CBGs ranging 100-130s - Denies low blood sugars . Counsel patient on s/s of low blood sugar and how to manage any lows . Encourage patient to continue using weekly pillbox to aid with medication adherence.  . Coordination of care - Note patient's next Endocrinology visit scheduled for 2/25.  o Encourage patient to call to follow up with Endocrinology for time of appointment - Provide patient with phone number for this clinic as requested - Remind patient to bring glucometer with her to appointment  Patient Self Care Activities:  . Currently UNABLE to independently manage diabetes as evidenced by a recent A1C of 14% . Patient to check blood sugars as directed and keep log . Patient to attend provider appointments as scheduled   Please see past updates related to this goal by clicking on the "Past Updates" button in the selected goal         Plan  Telephone follow up appointment with care management team member scheduled for:  3/1  [name], PharmD, [facility] [phone]

## 2019-04-08 NOTE — Patient Instructions (Signed)
Thank you allowing the Chronic Care Management Team to be a part of your care! It was a pleasure speaking with you today!     CCM (Chronic Care Management) Team    Noreene Larsson RN, MSN, CCM Nurse Care Coordinator  [phone]   [name] PharmD  Clinical Pharmacist  [phone]   Eula Fried LCSW Clinical Social Worker [phone]  Visit Information  Goals Addressed            This Visit's Progress   . PharmD- I don't have a blood sugar meter (pt-stated)       Current Barriers:  [name] Knowledge deficits related to impact of diet on blood sugar and medications to manage blood sugar . Non Adherence to prescribed medication regimen . Financial Barriers . Lack of blood sugar results for clinical team  Pharmacist Clinical Goal(s):  [name] Over the next 30 days, patient will work with CM Pharmacist to address needs related to obtaining a blood glucose meter, medication adherence and medication regimen optimization  Interventions: . Perform chart review. Note patient had virtual appointment with PCP on 1/13 for symptoms including: abdominal pain, chest tightness, cough and dyspnea o Note COVID-19 testing completed on 1/14; result: not detected . Follow up with patient regarding recent illness. Ms. Purtee reports significant improvement in symptoms . Counsel on importance of blood sugar control and monitoring o Ms. Newland reports taking: - Toujeo 76 units each morning - Jardiance 25 mg once daily - States taking Ozempic weekly, but unable to confirm current dose . Review instructions from last Endocrinologist appointment (11/23) with patient. States that she will review and confirm that she is taking Ozempic 0.5 mg weekly as directed - Reports has been checking fasting CBGs, but not at night before bed . States will start checking at bedtime as well as directed o Reports fasting CBGs ranging 100-130s - Denies low blood sugars . Counsel patient on s/s of low blood sugar  and how to manage any lows . Encourage patient to continue using weekly pillbox to aid with medication adherence.  . Coordination of care - Note patient's next Endocrinology visit scheduled for 2/25.  o Encourage patient to call to follow up with Endocrinology for time of appointment - Provide patient with phone number for this clinic as requested - Remind patient to bring glucometer with her to appointment  Patient Self Care Activities:  . Currently UNABLE to independently manage diabetes as evidenced by a recent A1C of 14% . Patient to check blood sugars as directed and keep log . Patient to attend provider appointments as scheduled   Please see past updates related to this goal by clicking on the "Past Updates" button in the selected goal         The patient verbalized understanding of instructions provided today and declined a print copy of patient instruction materials.   Telephone follow up appointment with care management team member scheduled for: 3/1  [name], PharmD, [place] [phone]

## 2019-04-12 DIAGNOSIS — E113393 Type 2 diabetes mellitus with moderate nonproliferative diabetic retinopathy without macular edema, bilateral: Secondary | ICD-10-CM | POA: Diagnosis not present

## 2019-04-12 DIAGNOSIS — H401131 Primary open-angle glaucoma, bilateral, mild stage: Secondary | ICD-10-CM | POA: Diagnosis not present

## 2019-04-12 DIAGNOSIS — H04123 Dry eye syndrome of bilateral lacrimal glands: Secondary | ICD-10-CM | POA: Diagnosis not present

## 2019-04-12 LAB — HM DIABETES EYE EXAM

## 2019-04-13 ENCOUNTER — Encounter: Payer: Self-pay | Admitting: Family Medicine

## 2019-04-19 ENCOUNTER — Telehealth: Payer: Self-pay

## 2019-05-03 ENCOUNTER — Telehealth: Payer: Medicare Other | Admitting: General Practice

## 2019-05-03 ENCOUNTER — Ambulatory Visit (INDEPENDENT_AMBULATORY_CARE_PROVIDER_SITE_OTHER): Payer: Medicare Other | Admitting: General Practice

## 2019-05-03 DIAGNOSIS — E1149 Type 2 diabetes mellitus with other diabetic neurological complication: Secondary | ICD-10-CM

## 2019-05-03 DIAGNOSIS — E1169 Type 2 diabetes mellitus with other specified complication: Secondary | ICD-10-CM | POA: Diagnosis not present

## 2019-05-03 DIAGNOSIS — E1165 Type 2 diabetes mellitus with hyperglycemia: Secondary | ICD-10-CM

## 2019-05-03 DIAGNOSIS — E785 Hyperlipidemia, unspecified: Secondary | ICD-10-CM | POA: Diagnosis not present

## 2019-05-03 DIAGNOSIS — IMO0002 Reserved for concepts with insufficient information to code with codable children: Secondary | ICD-10-CM

## 2019-05-03 DIAGNOSIS — I10 Essential (primary) hypertension: Secondary | ICD-10-CM | POA: Diagnosis not present

## 2019-05-03 NOTE — Chronic Care Management (AMB) (Signed)
Chronic Care Management   Follow Up Note   05/03/2019 Name: Alyssa Perkins MRN: [id number] DOB: December 29, 1950  Referred by: [name], DO Reason for referral : Chronic Care Management (HTN/DM2/HLD)   Alyssa Perkins is a 69 y.o. year old female who is a primary care patient of [name], DO. The CCM team was consulted for assistance with chronic disease management and care coordination needs.    Review of patient status, including review of consultants reports, relevant laboratory and other test results, and collaboration with appropriate care team members and the patient's provider was performed as part of comprehensive patient evaluation and provision of chronic care management services.    SDOH (Social Determinants of Health) screening performed today: Biomedical engineer  Food Insecurity  Alcohol/Substance Use Tobacco Use Stress Physical Activity. See Care Plan for related entries.   Outpatient Encounter Medications as of 05/03/2019  Medication Sig Note   Blood Glucose Monitoring Suppl (ONETOUCH VERIO) w/Device KIT Use to check blood sugar up to 3 x daily    diclofenac sodium (VOLTAREN) 1 % GEL Apply 2 g topically 3 (three) times daily as needed. (Patient not taking: Reported on 03/31/2019)    Elastic Bandages & Supports (MEDICAL COMPRESSION STOCKINGS) MISC 1 each by Does not apply route daily.    empagliflozin (JARDIANCE) 25 MG TABS tablet Take 25 mg by mouth daily with breakfast.    famotidine (PEPCID) 20 MG tablet Take 1 tablet (20 mg total) by mouth daily.    gabapentin (NEURONTIN) 400 MG capsule Take 1 capsule (400 mg total) by mouth 3 (three) times daily.    hydrOXYzine (ATARAX/VISTARIL) 25 MG tablet Take 1 tablet (25 mg total) by mouth daily as needed for itching. For itching    ibuprofen (ADVIL,MOTRIN) 200 MG tablet Take 600-800 mg by mouth every 6 (six) hours as needed for moderate pain.    Insulin Glargine, 1  Unit Dial, (TOUJEO SOLOSTAR) 300 UNIT/ML SOPN Inject 76 Units into the skin daily.    Insulin Pen Needle 31G X 5 MM MISC 1 each by Does not apply route daily. Use to inject Toujeo daily. Dx:E11.9    omeprazole (PRILOSEC) 20 MG capsule Take 1 capsule (20 mg total) by mouth daily.    ondansetron (ZOFRAN-ODT) 4 MG disintegrating tablet Take 1 tablet (4 mg total) by mouth every 8 (eight) hours as needed for nausea or vomiting.    OneTouch Delica Lancets 56P MISC Use to check blood sugar up to 3 x daily    ONETOUCH VERIO test strip Check blood sugar up to 3 x daily    quinapril (ACCUPRIL) 20 MG tablet Take 1 tablet (20 mg total) by mouth at bedtime.    Semaglutide,0.25 or 0.5MG/DOS, (OZEMPIC, 0.25 OR 0.5 MG/DOSE,) 2 MG/1.5ML SOPN Inject 0.5 mg into the skin once a week. 01/28/2019: Taking on Sundays   sertraline (ZOLOFT) 50 MG tablet Take 1 tablet (50 mg total) by mouth daily.    simvastatin (ZOCOR) 40 MG tablet Take 1 tablet (40 mg total) by mouth at bedtime. Reported on 05/16/2015    traMADol (ULTRAM) 50 MG tablet Take 1 tablet (50 mg total) by mouth daily as needed for moderate pain (headache). (Patient not taking: Reported on 03/31/2019)    traZODone (DESYREL) 50 MG tablet Take 1 tablet (50 mg total) by mouth at bedtime.    [DISCONTINUED] ranitidine (ZANTAC) 300 MG tablet Take 1 tablet (300 mg total) by mouth daily.    No facility-administered encounter  medications on file as of 05/03/2019.     Objective:  Lab Results  Component Value Date   HGBA1C 14.0 (A) 10/21/2018   BP Readings from Last 3 Encounters:  01/26/19 134/67  10/21/18 136/65  04/14/18 130/68    Goals Addressed            This Visit's Progress    RNCM- I need help with my diabetes (pt-stated)       Current Barriers:   Knowledge Deficits related to basic Diabetes pathophysiology and self care/management  Knowledge Deficits related to medications used for management of diabetes  Knowledge Deficits related  to adherence of a heart healthy/ADA diet  Difficulty obtaining or cannot afford medications  Astronomer barriers  Case Manager Clinical Goal(s):  Over the next 120 days, patient will demonstrate improved adherence to prescribed treatment plan for diabetes self care/management as evidenced by:   daily monitoring and recording of CBG   adherence to ADA/ carb modified diet  adherence to prescribed medication regimen  Interventions:   Provided education to patient about basic DM disease process  Reviewed medications with patient and discussed importance of medication adherence  Discussed plans with patient for ongoing care management follow up and provided patient with direct contact information for care management team  Reviewed scheduled/upcoming provider appointments including: need to follow up with pcp, appointment to see podiatrist, endocrinology appointment   Discussed the need to follow a Heart healthy/ADA diet and review of sugar substitutes   Patient states she only takes her blood sugars 1 time a day and today was 160. Education on the importance of checking regularly to maintain health and well being  Evaluation of foot exams. The patient does not check daily, states she does need to see the podiatrist, advised to check feet daily for sores and cuts  No b/p readings at this time, reports not checking.  Stated she still needed to go to the eye doctor  Patient Self Care Activities:   UNABLE to independently manage diabetes as evidenced by a recent A1C of 14% on 10/21/18, per the patient she has not had any new readings.   Please see past updates related to this goal by clicking on the "Past Updates" button in the selected goal           Plan:   The care management team will reach out to the patient again over the next 30 to 60 days.    Noreene Larsson RN, MSN, [facility] Mobile: [phone]

## 2019-05-03 NOTE — Patient Instructions (Signed)
Visit Information  Goals Addressed            This Visit's Progress   . RNCM- I need help with my diabetes (pt-stated)       Current Barriers:  [name] Knowledge Deficits related to basic Diabetes pathophysiology and self care/management . Knowledge Deficits related to medications used for management of diabetes . Knowledge Deficits related to adherence of a heart healthy/ADA diet . 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 daily monitoring and recording of CBG  . adherence to ADA/ carb modified diet . adherence to prescribed medication regimen  Interventions:  . Provided education to patient about basic DM disease process . Reviewed medications with patient and discussed importance of medication adherence . Discussed plans with patient for ongoing care management follow up and provided patient with direct contact information for care management team . Reviewed scheduled/upcoming provider appointments including: need to follow up with pcp, appointment to see podiatrist, endocrinology appointment  . Discussed the need to follow a Heart healthy/ADA diet and review of sugar substitutes  . Patient states she only takes her blood sugars 1 time a day and today was 160. Education on the importance of checking regularly to maintain health and well being . Evaluation of foot exams. The patient does not check daily, states she does need to see the podiatrist, advised to check feet daily for sores and cuts . No b/p readings at this time, reports not checking. . Stated she still needed to go to the eye doctor  Patient Self Care Activities:  . UNABLE to independently manage diabetes as evidenced by a recent A1C of 14% on 10/21/18, per the patient she has not had  any new readings.   Please see past updates related to this goal by clicking on the "Past Updates" button in the selected goal          The patient verbalized understanding of instructions provided today and declined a print copy of patient instruction materials.   The care management team will reach out to the patient again over the next 30 to 60 days.   Noreene Larsson RN, MSN, [facility] Mobile: [phone]  Diabetes Mellitus and Foot Care Foot care is an important part of your health, especially when you have diabetes. Diabetes may cause you to have problems because of poor blood flow (circulation) to your feet and legs, which can cause your skin to:  Become thinner and drier.  Break more easily.  Heal more slowly.  Peel and crack. You may also have nerve damage (neuropathy) in your legs and feet, causing decreased feeling in them. This means that you may not notice minor injuries to your feet that could lead to more serious problems. Noticing and addressing any potential problems early is the best way to prevent future foot problems. How to care for your feet Foot hygiene  Wash your feet daily with warm water and mild soap. Do not use hot water. Then, pat your feet and the areas between your toes until they are completely dry. Do not soak your feet as this can dry your skin.  Trim your toenails straight across. Do not dig under them or around the cuticle. File the edges of your nails  with an Paediatric nurse.  Apply a moisturizing lotion or petroleum jelly to the skin on your feet and to dry, brittle toenails. Use lotion that does not contain alcohol and is unscented. Do not apply lotion between your toes. Shoes and socks  Wear clean socks or stockings every day. Make sure they are not too tight. Do not wear knee-high stockings since they may decrease blood flow to your legs.  Wear  shoes that fit properly and have enough cushioning. Always look in your shoes before you put them on to be sure there are no objects inside.  To break in new shoes, wear them for just a few hours a day. This prevents injuries on your feet. Wounds, scrapes, corns, and calluses  Check your feet daily for blisters, cuts, bruises, sores, and redness. If you cannot see the bottom of your feet, use a mirror or ask someone for help.  Do not cut corns or calluses or try to remove them with medicine.  If you find a minor scrape, cut, or break in the skin on your feet, keep it and the skin around it clean and dry. You may clean these areas with mild soap and water. Do not clean the area with peroxide, alcohol, or iodine.  If you have a wound, scrape, corn, or callus on your foot, look at it several times a day to make sure it is healing and not infected. Check for: ? Redness, swelling, or pain. ? Fluid or blood. ? Warmth. ? Pus or a bad smell. General instructions  Do not cross your legs. This may decrease blood flow to your feet.  Do not use heating pads or hot water bottles on your feet. They may burn your skin. If you have lost feeling in your feet or legs, you may not know this is happening until it is too late.  Protect your feet from hot and cold by wearing shoes, such as at the beach or on hot pavement.  Schedule a complete foot exam at least once a year (annually) or more often if you have foot problems. If you have foot problems, report any cuts, sores, or bruises to your health care provider immediately. Contact a health care provider if:  You have a medical condition that increases your risk of infection and you have any cuts, sores, or bruises on your feet.  You have an injury that is not healing.  You have redness on your legs or feet.  You feel burning or tingling in your legs or feet.  You have pain or cramps in your legs and feet.  Your legs or feet are numb.  Your feet  always feel cold.  You have pain around a toenail. Get help right away if:  You have a wound, scrape, corn, or callus on your foot and: ? You have pain, swelling, or redness that gets worse. ? You have fluid or blood coming from the wound, scrape, corn, or callus. ? Your wound, scrape, corn, or callus feels warm to the touch. ? You have pus or a bad smell coming from the wound, scrape, corn, or callus. ? You have a fever. ? You have a red line going up your leg. Summary  Check your feet every day for cuts, sores, red spots, swelling, and blisters.  Moisturize feet and legs daily.  Wear shoes that fit properly and have enough cushioning.  If you have foot problems, report any cuts, sores, or bruises to your  health care provider immediately.  Schedule a complete foot exam at least once a year (annually) or more often if you have foot problems. This information is not intended to replace advice given to you by your health care provider. Make sure you discuss any questions you have with your health care provider. Document Revised: 11/25/2018 Document Reviewed: 04/05/2016 Elsevier Patient Education  [place].  Diabetes Mellitus and Nutrition, Adult When you have diabetes (diabetes mellitus), it is very important to have healthy eating habits because your blood sugar (glucose) levels are greatly affected by what you eat and drink. Eating healthy foods in the appropriate amounts, at about the same times every day, can help you:  Control your blood glucose.  Lower your risk of heart disease.  Improve your blood pressure.  Reach or maintain a healthy weight. Every person with diabetes is different, and each person has different needs for a meal plan. Your health care provider may recommend that you work with a diet and nutrition specialist (dietitian) to make a meal plan that is best for you. Your meal plan may vary depending on factors such as:  The calories you need.  The  medicines you take.  Your weight.  Your blood glucose, blood pressure, and cholesterol levels.  Your activity level.  Other health conditions you have, such as heart or kidney disease. How do carbohydrates affect me? Carbohydrates, also called carbs, affect your blood glucose level more than any other type of food. Eating carbs naturally raises the amount of glucose in your blood. Carb counting is a method for keeping track of how many carbs you eat. Counting carbs is important to keep your blood glucose at a healthy level, especially if you use insulin or take certain oral diabetes medicines. It is important to know how many carbs you can safely have in each meal. This is different for every person. Your dietitian can help you calculate how many carbs you should have at each meal and for each snack. Foods that contain carbs include:  Bread, cereal, rice, pasta, and crackers.  Potatoes and corn.  Peas, beans, and lentils.  Milk and yogurt.  Fruit and juice.  Desserts, such as cakes, cookies, ice cream, and candy. How does alcohol affect me? Alcohol can cause a sudden decrease in blood glucose (hypoglycemia), especially if you use insulin or take certain oral diabetes medicines. Hypoglycemia can be a life-threatening condition. Symptoms of hypoglycemia (sleepiness, dizziness, and confusion) are similar to symptoms of having too much alcohol. If your health care provider says that alcohol is safe for you, follow these guidelines:  Limit alcohol intake to no more than 1 drink per day for nonpregnant women and 2 drinks per day for men. One drink equals 12 oz of beer, 5 oz of wine, or 1 oz of hard liquor.  Do not drink on an empty stomach.  Keep yourself hydrated with water, diet soda, or unsweetened iced tea.  Keep in mind that regular soda, juice, and other mixers may contain a lot of sugar and must be counted as carbs. What are tips for following this plan?  Reading food  labels  Start by checking the serving size on the "Nutrition Facts" label of packaged foods and drinks. The amount of calories, carbs, fats, and other nutrients listed on the label is based on one serving of the item. Many items contain more than one serving per package.  Check the total grams (g) of carbs in one serving. You can  calculate the number of servings of carbs in one serving by dividing the total carbs by 15. For example, if a food has 30 g of total carbs, it would be equal to 2 servings of carbs.  Check the number of grams (g) of saturated and trans fats in one serving. Choose foods that have low or no amount of these fats.  Check the number of milligrams (mg) of salt (sodium) in one serving. Most people should limit total sodium intake to less than 2,300 mg per day.  Always check the nutrition information of foods labeled as "low-fat" or "nonfat". These foods may be higher in added sugar or refined carbs and should be avoided.  Talk to your dietitian to identify your daily goals for nutrients listed on the label. Shopping  Avoid buying canned, premade, or processed foods. These foods tend to be high in fat, sodium, and added sugar.  Shop around the outside edge of the grocery store. This includes fresh fruits and vegetables, bulk grains, fresh meats, and fresh dairy. Cooking  Use low-heat cooking methods, such as baking, instead of high-heat cooking methods like deep frying.  Cook using healthy oils, such as olive, canola, or sunflower oil.  Avoid cooking with butter, cream, or high-fat meats. Meal planning  Eat meals and snacks regularly, preferably at the same times every day. Avoid going long periods of time without eating.  Eat foods high in fiber, such as fresh fruits, vegetables, beans, and whole grains. Talk to your dietitian about how many servings of carbs you can eat at each meal.  Eat 4-6 ounces (oz) of lean protein each day, such as lean meat, chicken, fish,  eggs, or tofu. One oz of lean protein is equal to: ? 1 oz of meat, chicken, or fish. ? 1 egg. ?  cup of tofu.  Eat some foods each day that contain healthy fats, such as avocado, nuts, seeds, and fish. Lifestyle  Check your blood glucose regularly.  Exercise regularly as told by your health care provider. This may include: ? 150 minutes of moderate-intensity or vigorous-intensity exercise each week. This could be brisk walking, biking, or water aerobics. ? Stretching and doing strength exercises, such as yoga or weightlifting, at least 2 times a week.  Take medicines as told by your health care provider.  Do not use any products that contain nicotine or tobacco, such as cigarettes and e-cigarettes. If you need help quitting, ask your health care provider.  Work with a Social worker or diabetes educator to identify strategies to manage stress and any emotional and social challenges. Questions to ask a health care provider  Do I need to meet with a diabetes educator?  Do I need to meet with a dietitian?  What number can I call if I have questions?  When are the best times to check my blood glucose? Where to find more information:  American Diabetes Association: diabetes.org  Academy of Nutrition and Dietetics: www.eatright.[employer] of Diabetes and Digestive and Kidney Diseases (NIH): [employer] Summary  A healthy meal plan will help you control your blood glucose and maintain a healthy lifestyle.  Working with a diet and nutrition specialist (dietitian) can help you make a meal plan that is best for you.  Keep in mind that carbohydrates (carbs) and alcohol have immediate effects on your blood glucose levels. It is important to count carbs and to use alcohol carefully. This information is not intended to replace advice given to you by your health  care provider. Make sure you discuss any questions you have with your health care provider. Document Revised:  02/14/2017 Document Reviewed: 04/08/2016 Elsevier Patient Education  2020 [employer].

## 2019-05-06 ENCOUNTER — Ambulatory Visit: Payer: Medicare Other | Admitting: Family Medicine

## 2019-05-10 ENCOUNTER — Other Ambulatory Visit: Payer: Self-pay

## 2019-05-10 ENCOUNTER — Ambulatory Visit (INDEPENDENT_AMBULATORY_CARE_PROVIDER_SITE_OTHER): Payer: Medicare Other | Admitting: Family Medicine

## 2019-05-10 ENCOUNTER — Encounter: Payer: Self-pay | Admitting: Family Medicine

## 2019-05-10 VITALS — BP 95/58 | HR 84 | Temp 97.5°F | Resp 16 | Ht 63.0 in | Wt 205.0 lb

## 2019-05-10 DIAGNOSIS — F331 Major depressive disorder, recurrent, moderate: Secondary | ICD-10-CM

## 2019-05-10 DIAGNOSIS — E1140 Type 2 diabetes mellitus with diabetic neuropathy, unspecified: Secondary | ICD-10-CM

## 2019-05-10 DIAGNOSIS — G5603 Carpal tunnel syndrome, bilateral upper limbs: Secondary | ICD-10-CM

## 2019-05-10 DIAGNOSIS — M5412 Radiculopathy, cervical region: Secondary | ICD-10-CM

## 2019-05-10 DIAGNOSIS — G44221 Chronic tension-type headache, intractable: Secondary | ICD-10-CM

## 2019-05-10 DIAGNOSIS — Z794 Long term (current) use of insulin: Secondary | ICD-10-CM | POA: Diagnosis not present

## 2019-05-10 DIAGNOSIS — R197 Diarrhea, unspecified: Secondary | ICD-10-CM

## 2019-05-10 LAB — POCT GLYCOSYLATED HEMOGLOBIN (HGB A1C): Hemoglobin A1C: 7.5 % — AB (ref 4.0–5.6)

## 2019-05-10 MED ORDER — OZEMPIC (1 MG/DOSE) 2 MG/1.5ML ~~LOC~~ SOPN: 1.0000 mg | PEN_INJECTOR | SUBCUTANEOUS | 3 refills | Status: DC

## 2019-05-10 MED ORDER — TOUJEO SOLOSTAR 300 UNIT/ML ~~LOC~~ SOPN: 70.0000 [IU] | PEN_INJECTOR | Freq: Every day | SUBCUTANEOUS | 3 refills | Status: DC

## 2019-05-10 MED ORDER — GABAPENTIN 100 MG PO CAPS: 200.0000 mg | ORAL_CAPSULE | Freq: Three times a day (TID) | ORAL | 1 refills | Status: DC

## 2019-05-10 NOTE — Patient Instructions (Addendum)
Thank you for coming to the office today.  Increase Ozempic from 0.5 up to 1mg  - once a week.  New rx sent.  REDUCE Toujeo insulin - from 76 units down to 70 units daily. - Now every 1 week, if your fasting blood sugars average < 150, you can decrease again by 2 units.  Reduced Gabapentin now to smaller dose, 2 capsules per dose 3 times a day.  --------  [facility]) [address], [place] [address] Phone: [phone]  [facility] - Neurology Dept [place], [place] [address] Phone: [phone]   Please schedule a Follow-up Appointment to: Return in about 3 months (around 08/07/2019) for DM A1c GI/Neuro follow-up, Mood PHQ.  If you have any other questions or concerns, please feel free to call the office or send a message through [place]. You may also schedule an earlier appointment if necessary.  Additionally, you may be receiving a survey about your experience at our office within a few days to 1 week by e-mail or mail. We value your feedback.  Nobie Putnam, DO [place]

## 2019-05-10 NOTE — Progress Notes (Signed)
Subjective:    Patient ID: Alyssa Perkins, female    DOB: 08-Aug-1950, 69 y.o.   MRN: [id number]  Alyssa Perkins is a 69 y.o. female presenting on 05/10/2019 for Diarrhea and Diabetes   HPI  CHRONIC DM, Type 2 Last visit for DM 10/2018, started ozemipc, followed by [facility] 01/2019, See background notes Significantly improved adherence to meds and has done well on current regimen. Still goals to improve lifestyle but she has majorly limited sugar/sweets and excess sugar/carbs CBGs: improved results 80-90 lowest, 100-120 common readings, often < 150 Meds: ozempic 0.5mg  weekly inj, Jardiance 25mg  daily, Toujeo 76 units daily Currently on ACEi Lifestyle: - Diet improved - Exercise (no regular exercise, limited) History of cataracts s/p removal. 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  Abdominal Pain / Watery Diarrhea Reports symptoms seem to be chronic worsening diarrhea, seems worse over 2-3 months, admits explosive diarrhea. She takes Imodium PRN. Says diarrhea is more watery. No other features, no blood, dark, or unusual odor. Has some fecal urgency.  Chronic Headache Recurrent flare, has history of fall and associated dizziness. Takes Tramadol for pain. Chronic headache - Prior history of cervical DJD and radiculopathy  OSA CPAP / Insomnia She says CPAP not functioning properly last sleep study done in 2018. [facility] Says she thinks the settings are wrong, and request help Dramatically worsening daytime sleepiness due to not sleeping well at night not using CPAP now    Depression screen [facility] 2/9 05/10/2019 03/31/2019 10/21/2018  Decreased Interest 3 3 3   Down, Depressed, Hopeless 1 1 2   PHQ - 2 Score 4 4 5   Altered sleeping 3 3 1   Tired, decreased energy 3 3 1   Change in appetite 1 1 2     Feeling bad or failure about yourself  0 0 1  Trouble concentrating 3 3 1   Moving slowly or fidgety/restless 0 0 0  Suicidal thoughts 0 0 1  PHQ-9 Score 14 14 12   Difficult doing work/chores Somewhat difficult Somewhat difficult Somewhat difficult  Some recent data might be hidden   GAD 7 : Generalized Anxiety Score 10/21/2018 04/09/2018 10/15/2017 01/15/2017  Nervous, Anxious, on Edge 2 1 2 2   Control/stop worrying 3 2 2 3   Worry too much - different things 2 1 3 3   Trouble relaxing 2 1 3 2   Restless 2 1 2 2   Easily annoyed or irritable 2 2 2 3   Afraid - awful might happen 2 2 1 2   Total GAD 7 Score 15 10 15 17   Anxiety Difficulty Somewhat difficult Not difficult at all Somewhat difficult Somewhat difficult     Social History   Tobacco Use  . Smoking status: Former Smoker    Packs/day: 0.50    Years: 15.00    Pack years: 7.50    Types: Cigarettes    Quit date: 07/17/1994    Years since quitting: 24.8  . Smokeless tobacco: Former Systems developer  . Tobacco comment: stopped 18 years ago  Substance Use Topics  . Alcohol use: No  . Drug use: No    Review of Systems Per HPI unless specifically indicated above     Objective:    BP (!) 95/58   Pulse 84   Temp (!) 97.5 F (36.4 C) (Temporal)   Resp 16   Ht 5\' 3"  (1.6 m)   Wt  205 lb (93 kg)   BMI 36.31 kg/m   Wt Readings from Last 3 Encounters:  05/10/19 205 lb (93 kg)  10/21/18 215 lb (97.5 kg)  04/14/18 215 lb (97.5 kg)    Physical Exam Vitals and nursing note reviewed.  Constitutional:      General: She is not in acute distress.    Appearance: She is well-developed. She is not diaphoretic.     Comments: Well-appearing, comfortable, cooperative, obese  HENT:     Head: Normocephalic and atraumatic.  Eyes:     General:        Right eye: No discharge.        Left eye: No discharge.     Conjunctiva/sclera: Conjunctivae normal.  Neck:     Thyroid: No thyromegaly.  Cardiovascular:     Rate and Rhythm: Normal rate and  regular rhythm.     Heart sounds: Normal heart sounds. No murmur.  Pulmonary:     Effort: Pulmonary effort is normal. No respiratory distress.     Breath sounds: Normal breath sounds. No wheezing or rales.  Musculoskeletal:        General: Normal range of motion.     Cervical back: Normal range of motion and neck supple.  Lymphadenopathy:     Cervical: No cervical adenopathy.  Skin:    General: Skin is warm and dry.     Findings: No erythema or rash.  Neurological:     Mental Status: She is alert and oriented to person, place, and time.  Psychiatric:        Behavior: Behavior normal.     Comments: Well groomed, good eye contact, normal speech and thoughts    Results for orders placed or performed in visit on 05/10/19  POCT HgB A1C  Result Value Ref Range   Hemoglobin A1C 7.5 (A) 4.0 - 5.6 %   Recent Labs    10/21/18 1629 05/10/19 1128  HGBA1C 14.0* 7.5*       Assessment & Plan:   Problem List Items Addressed This Visit    Type 2 diabetes mellitus with neurologic complication (HCC) - Primary   Relevant Medications   Insulin Glargine, 1 Unit Dial, (TOUJEO SOLOSTAR) 300 UNIT/ML SOPN   OZEMPIC, 1 MG/DOSE, 2 MG/1.5ML SOPN   Other Relevant Orders   POCT HgB A1C (Completed)   Chronic headaches   Relevant Medications   gabapentin (NEURONTIN) 100 MG capsule   Other Relevant Orders   Ambulatory referral to Neurology   Cervical radiculopathy   Relevant Medications   gabapentin (NEURONTIN) 100 MG capsule   Other Relevant Orders   Ambulatory referral to Neurology   Bilateral carpal tunnel syndrome   Relevant Medications   gabapentin (NEURONTIN) 100 MG capsule    Other Visit Diagnoses    Watery diarrhea       Relevant Orders   Ambulatory referral to Gastroenterology     Dramatically improved A1c down to 7.5 from prior uncontrolled >14 Improved lifestyle diet / med adherence / Endocrinology Complications - CKD-II to III, peripheral neuropathy, DM retinopathy /  cataracts other including hyperlipidemia GERD, depression, obesity, hypothyroidism, OSA - increases risk of future cardiovascular complications // poor glucose control due to reduced lifestyle diet/exercise with low energy mood and fatigue  Plan:  1. Increase Ozempic from 0.5mg  weekly up to 1mg  weekly 2. Reduce Toujeo from 76 to 70u daily, reduce down by 2 units every 1 week as long as fasting CBG < 150 3. Continue Jardiance 25mg  daily 4.  Encourage improved lifestyle - low carb, low sugar diet, reduce portion size, start regular exercise  --------  #Chronic Headaches Referral to [facility] Neurology given persistent issue recurrent headaches, dizziness lightheaded Limited benefit on variety of meds in past  #Watery Stools Referral to Gi for further evaluation and management  Orders Placed This Encounter  Procedures  . Ambulatory referral to Gastroenterology    Referral Priority:   Routine    Referral Type:   Consultation    Referral Reason:   Specialty Services Required    Number of Visits Requested:   1  . Ambulatory referral to Neurology    Referral Priority:   Routine    Referral Type:   Consultation    Referral Reason:   Specialty Services Required    Requested Specialty:   Neurology    Number of Visits Requested:   1  . POCT HgB A1C       Meds ordered this encounter  Medications  . Insulin Glargine, 1 Unit Dial, (TOUJEO SOLOSTAR) 300 UNIT/ML SOPN    Sig: Inject 70 Units into the skin daily.    Dispense:  15 mL    Refill:  3  . OZEMPIC, 1 MG/DOSE, 2 MG/1.5ML SOPN    Sig: Inject 1 mg into the skin once a week.    Dispense:  6 pen    Refill:  3    Dose increase from 0.5 up to 1  . gabapentin (NEURONTIN) 100 MG capsule    Sig: Take 2 capsules (200 mg total) by mouth in the morning, at noon, and at bedtime.    Dispense:  540 capsule    Refill:  1      Follow up plan: Return in about 3 months (around 08/07/2019) for DM A1c GI/Neuro follow-up, Mood PHQ.   Nobie Putnam, DO [facility] 05/10/2019, 11:25 AM

## 2019-05-12 ENCOUNTER — Encounter: Payer: Self-pay | Admitting: *Deleted

## 2019-05-13 DIAGNOSIS — L600 Ingrowing nail: Secondary | ICD-10-CM | POA: Diagnosis not present

## 2019-05-13 DIAGNOSIS — E1165 Type 2 diabetes mellitus with hyperglycemia: Secondary | ICD-10-CM | POA: Diagnosis not present

## 2019-05-13 DIAGNOSIS — E1159 Type 2 diabetes mellitus with other circulatory complications: Secondary | ICD-10-CM | POA: Diagnosis not present

## 2019-05-13 DIAGNOSIS — E1169 Type 2 diabetes mellitus with other specified complication: Secondary | ICD-10-CM | POA: Diagnosis not present

## 2019-05-13 DIAGNOSIS — E1140 Type 2 diabetes mellitus with diabetic neuropathy, unspecified: Secondary | ICD-10-CM | POA: Diagnosis not present

## 2019-05-13 DIAGNOSIS — Z89421 Acquired absence of other right toe(s): Secondary | ICD-10-CM | POA: Diagnosis not present

## 2019-05-13 DIAGNOSIS — Z794 Long term (current) use of insulin: Secondary | ICD-10-CM | POA: Diagnosis not present

## 2019-05-13 DIAGNOSIS — E1143 Type 2 diabetes mellitus with diabetic autonomic (poly)neuropathy: Secondary | ICD-10-CM | POA: Diagnosis not present

## 2019-05-13 DIAGNOSIS — I10 Essential (primary) hypertension: Secondary | ICD-10-CM | POA: Diagnosis not present

## 2019-05-13 DIAGNOSIS — L03031 Cellulitis of right toe: Secondary | ICD-10-CM | POA: Diagnosis not present

## 2019-05-13 DIAGNOSIS — B351 Tinea unguium: Secondary | ICD-10-CM | POA: Diagnosis not present

## 2019-05-13 DIAGNOSIS — E669 Obesity, unspecified: Secondary | ICD-10-CM | POA: Diagnosis not present

## 2019-05-13 DIAGNOSIS — E785 Hyperlipidemia, unspecified: Secondary | ICD-10-CM | POA: Diagnosis not present

## 2019-05-15 ENCOUNTER — Emergency Department
Admission: EM | Admit: 2019-05-15 | Discharge: 2019-05-15 | Disposition: A | Discharge status: Home / Self Care | Payer: Medicare Other | Attending: Emergency Medicine | Admitting: Emergency Medicine

## 2019-05-15 ENCOUNTER — Other Ambulatory Visit: Payer: Self-pay

## 2019-05-15 ENCOUNTER — Emergency Department: Payer: Medicare Other

## 2019-05-15 ENCOUNTER — Encounter: Payer: Self-pay | Admitting: Emergency Medicine

## 2019-05-15 DIAGNOSIS — Z79899 Other long term (current) drug therapy: Secondary | ICD-10-CM | POA: Diagnosis not present

## 2019-05-15 DIAGNOSIS — I10 Essential (primary) hypertension: Secondary | ICD-10-CM | POA: Diagnosis not present

## 2019-05-15 DIAGNOSIS — M25511 Pain in right shoulder: Secondary | ICD-10-CM | POA: Insufficient documentation

## 2019-05-15 DIAGNOSIS — M4722 Other spondylosis with radiculopathy, cervical region: Secondary | ICD-10-CM | POA: Diagnosis not present

## 2019-05-15 DIAGNOSIS — M5412 Radiculopathy, cervical region: Secondary | ICD-10-CM

## 2019-05-15 DIAGNOSIS — M542 Cervicalgia: Secondary | ICD-10-CM | POA: Diagnosis not present

## 2019-05-15 DIAGNOSIS — Z794 Long term (current) use of insulin: Secondary | ICD-10-CM | POA: Insufficient documentation

## 2019-05-15 DIAGNOSIS — E1149 Type 2 diabetes mellitus with other diabetic neurological complication: Secondary | ICD-10-CM | POA: Insufficient documentation

## 2019-05-15 MED ORDER — CYCLOBENZAPRINE HCL 5 MG PO TABS: 5.0000 mg | ORAL_TABLET | Freq: Three times a day (TID) | ORAL | 0 refills | Status: DC | PRN

## 2019-05-15 MED ORDER — ORPHENADRINE CITRATE 30 MG/ML IJ SOLN
60.0000 mg | Freq: Once | INTRAMUSCULAR | Status: AC
  Administered 2019-05-15: 60 mg via INTRAMUSCULAR
  Filled 2019-05-15: qty 2

## 2019-05-15 MED ORDER — TRAMADOL HCL 50 MG PO TABS: 50.0000 mg | ORAL_TABLET | Freq: Four times a day (QID) | ORAL | 0 refills | Status: DC | PRN

## 2019-05-15 MED ORDER — OXYCODONE-ACETAMINOPHEN 5-325 MG PO TABS
1.0000 | ORAL_TABLET | Freq: Once | ORAL | Status: AC
  Administered 2019-05-15: 1 via ORAL
  Filled 2019-05-15: qty 1

## 2019-05-15 NOTE — ED Notes (Signed)
Pt c/o right shoulder pain (see triage note) that started today, pt denies injury or history of this type of pain. CMS is intact. Pt denies numbness or weakness. Skin is warm, dry, pink.

## 2019-05-15 NOTE — Discharge Instructions (Addendum)
Please apply heating pad to the right side of the neck.  Work on gentle neck range of motion exercises.  Take tramadol and Flexeril as needed.  You may use Tylenol for additional pain relief as needed.  If any fevers, worsening pain, weakness in the right upper extremity please return to the ER immediately.  Follow-up with primary care provider or orthopedics if no improvement 1 week.

## 2019-05-15 NOTE — ED Triage Notes (Signed)
Pt to ER with c/o right shoulder pain that started last night while she was making her bed.  Pt states pain shoots from shoulder down to elbow and up into neck and head.  Pt denies known injury, pain worsens with movement.

## 2019-05-15 NOTE — ED Provider Notes (Signed)
[facility] EMERGENCY DEPARTMENT Provider Note   CSN: [id number] Arrival date & time: 05/15/19  1230     History Chief Complaint  Patient presents with  . Shoulder Pain    Alyssa Perkins is a 69 y.o. female presents to the emergency department for evaluation of right-sided neck pain, right shoulder pain rating down to the right arm.  Her symptoms been present since 10 AM this morning.  No history of similar symptoms.  She denies any numbness or weakness.  Pain is worse with neck range of motion.  She has not had a medications for the symptoms.  She denies any numbness or tingling.  No fevers, headaches, vision changes  HPI     Past Medical History:  Diagnosis Date  . Anginal pain ([place])   . Anxiety    not on any medication at this time  . Arthritis   . Asthma    "sports asthma or exercised induced"seen at [place] for primary  . Chronic back pain   . Chronic neck pain   . Complication of anesthesia    "difficulty waking up from surgery"  . Depression    has OCD, states counts constantly  . GERD (gastroesophageal reflux disease)   . Headache(784.0)    hx of migraines  . Hyperlipidemia   . IBS (irritable bowel syndrome)   . PONV (postoperative nausea and vomiting)   . RLS (restless legs syndrome)   . Sleep apnea   . Thyroid disease     Patient Active Problem List   Diagnosis Date Noted  . Moderate episode of recurrent major depressive disorder ([place]) 04/09/2018  . BPPV (benign paroxysmal positional vertigo), right 10/15/2017  . Diabetic retinopathy ([place]) 02/26/2017  . Insomnia 02/21/2017  . Chronic headaches 02/21/2017  . OSA on CPAP 10/21/2016  . Depression 10/15/2016  . Chronic back pain 09/11/2016  . Chronic neck pain 09/11/2016  . RLS (restless legs syndrome) 07/08/2016  . Bilateral carpal tunnel syndrome 03/26/2016  . De Quervain's tenosynovitis, left 11/14/2015  . Type 2 diabetes mellitus with neurologic  complication ([place]) 45/36/4680  . Hyperlipidemia associated with type 2 diabetes mellitus ([place]) 09/16/2014  . Hypertension 09/16/2014  . Acid reflux 09/16/2014  . Cervical radiculopathy 12/30/2011    Past Surgical History:  Procedure Laterality Date  . ABDOMINAL HYSTERECTOMY     also had oophorectomy  . AMPUTATION TOE Right 09/05/2017   Procedure: AMPUTATION TOE-RIGHT 2ND TOE;  Surgeon: Sharlotte Alamo, DPM;  Location: [facility] ORS;  Service: Podiatry;  Laterality: Right;  . ANTERIOR CERVICAL DECOMP/DISCECTOMY FUSION  01/02/2012   Procedure: ANTERIOR CERVICAL DECOMPRESSION/DISCECTOMY FUSION 2 LEVELS;  Surgeon: Melina Schools, MD;  Location: [facility];  Service: Orthopedics;  Laterality: N/A;  ACDF C5-7  . BILATERAL CARPAL TUNNEL RELEASE    . CARDIAC CATHETERIZATION     done approx. 4- 5 years ago, Dr. Leanor Kail, does not see cardi. at this time.  . CHOLECYSTECTOMY    . DILATION AND CURETTAGE OF UTERUS     "several"  . HERNIA REPAIR    . IRRIGATION AND DEBRIDEMENT FOOT  09/05/2017   Procedure: IRRIGATION AND DEBRIDEMENT FOOT;  Surgeon: Sharlotte Alamo, DPM;  Location: [facility] ORS;  Service: Podiatry;;  . KNEE SURGERY     1 partial left, 1 total left  . SPINE SURGERY       OB History   No obstetric history on file.     Family History  Problem Relation Age of Onset  .  Heart disease Mother   . Osteoporosis Mother   . Breast cancer Mother        dx with ovarian ca 1st  . Ovarian cancer Mother        86's  . Osteoporosis Sister   . Diabetes Sister   . Breast cancer Sister        early 16's no braca testing   . Cancer Brother        sinus, bone  . Osteoporosis Brother     Social History   Tobacco Use  . Smoking status: Former Smoker    Packs/day: 0.50    Years: 15.00    Pack years: 7.50    Types: Cigarettes    Quit date: 07/17/1994    Years since quitting: 24.8  . Smokeless tobacco: Former Systems developer  . Tobacco comment: stopped 18 years ago  Substance Use Topics  . Alcohol use: No  .  Drug use: No    Home Medications Prior to Admission medications   Medication Sig Start Date End Date Taking? Authorizing Provider  Blood Glucose Monitoring Suppl (ONETOUCH VERIO) w/Device KIT Use to check blood sugar up to 3 x daily 10/28/18   Karamalegos, Devonne Doughty, DO  cyclobenzaprine (FLEXERIL) 5 MG tablet Take 1-2 tablets (5-10 mg total) by mouth 3 (three) times daily as needed for muscle spasms. 05/15/19   Duanne Guess, PA-C  diclofenac sodium (VOLTAREN) 1 % GEL Apply 2 g topically 3 (three) times daily as needed. 11/13/17   [name], DO  Elastic Bandages & Supports (MEDICAL COMPRESSION STOCKINGS) [place] 1 each by Does not apply route daily. 11/14/15   Luciana Axe, NP  empagliflozin (JARDIANCE) 25 MG TABS tablet Take 25 mg by mouth daily with breakfast. 02/08/19 02/08/20  [provider]  famotidine (PEPCID) 20 MG tablet Take 1 tablet (20 mg total) by mouth daily. 01/26/19 01/26/20  Cuthriell, Charline Bills, PA-C  gabapentin (NEURONTIN) 100 MG capsule Take 2 capsules (200 mg total) by mouth in the morning, at noon, and at bedtime. 05/10/19   Karamalegos, Devonne Doughty, DO  hydrOXYzine (ATARAX/VISTARIL) 25 MG tablet Take 1 tablet (25 mg total) by mouth daily as needed for itching. For itching 02/17/19   [name], DO  ibuprofen (ADVIL,MOTRIN) 200 MG tablet Take 600-800 mg by mouth every 6 (six) hours as needed for moderate pain.    [provider]  Insulin Glargine, 1 Unit Dial, (TOUJEO SOLOSTAR) 300 UNIT/ML SOPN Inject 70 Units into the skin daily. 05/10/19   Karamalegos, Devonne Doughty, DO  Insulin Pen Needle 31G X 5 MM MISC 1 each by Does not apply route daily. Use to inject Toujeo daily. Dx:E11.9 02/17/19   Karamalegos, Devonne Doughty, DO  omeprazole (PRILOSEC) 20 MG capsule Take 1 capsule (20 mg total) by mouth daily. 03/13/18   Karamalegos, Devonne Doughty, DO  ondansetron (ZOFRAN-ODT) 4 MG disintegrating tablet Take 1 tablet (4 mg total) by mouth every  8 (eight) hours as needed for nausea or vomiting. 01/26/19   Cuthriell, Charline Bills, PA-C  OneTouch Delica Lancets 86V MISC Use to check blood sugar up to 3 x daily 10/29/18   Parks Ranger, Devonne Doughty, DO  [facility] VERIO test strip Check blood sugar up to 3 x daily 10/29/18   Karamalegos, Devonne Doughty, DO  OZEMPIC, 1 MG/DOSE, 2 MG/1.5ML SOPN Inject 1 mg into the skin once a week. 05/10/19   Karamalegos, Devonne Doughty, DO  quinapril (ACCUPRIL) 20 MG tablet Take 1 tablet (20 mg  total) by mouth at bedtime. 02/17/19   Karamalegos, Devonne Doughty, DO  sertraline (ZOLOFT) 50 MG tablet Take 1 tablet (50 mg total) by mouth daily. 02/17/19   Karamalegos, Devonne Doughty, DO  simvastatin (ZOCOR) 40 MG tablet Take 1 tablet (40 mg total) by mouth at bedtime. Reported on 05/16/2015 02/17/19   [name], DO  traMADol (ULTRAM) 50 MG tablet Take 1 tablet (50 mg total) by mouth every 6 (six) hours as needed. 05/15/19 05/14/20  Duanne Guess, PA-C  traZODone (DESYREL) 50 MG tablet Take 1 tablet (50 mg total) by mouth at bedtime. 02/17/19   Karamalegos, Devonne Doughty, DO  ranitidine (ZANTAC) 300 MG tablet Take 1 tablet (300 mg total) by mouth daily. 01/26/19 01/26/19  Cuthriell, Charline Bills, PA-C    Allergies    Patient has no known allergies.  Review of Systems   Review of Systems  Constitutional: Negative for chills and fever.  Eyes: Negative for photophobia, pain and visual disturbance.  Cardiovascular: Negative for chest pain.  Gastrointestinal: Negative for nausea and vomiting.  Musculoskeletal: Positive for arthralgias and neck pain. Negative for joint swelling.  Skin: Negative for rash.  Neurological: Negative for headaches.    Physical Exam Updated Vital Signs BP 109/63 (BP Location: Left Arm)   Pulse 80   Temp 98.8 F (37.1 C)   Resp 16   Ht _0  (1.6 m)   Wt 91.2 kg   SpO2 95%   BMI 35.61 kg/m   Physical Exam Constitutional:      Appearance: She is well-developed.  HENT:     Head:  Normocephalic and atraumatic.  Eyes:     Conjunctiva/sclera: Conjunctivae normal.  Cardiovascular:     Rate and Rhythm: Normal rate.  Pulmonary:     Effort: Pulmonary effort is normal. No respiratory distress.  Musculoskeletal:     Cervical back: Normal range of motion.     Comments: Neck is tender along the spinous process and right paravertebral muscles.  She has pain with neck range of motion.  Neck range of motion is limited with rotation.  She has good flexion extension.  No cranial nerve deficits.  Shoulder with good internal and external rotation with no discomfort.  No swelling or effusion throughout the right shoulder.  She has no rest intact in right upper extremity.  Skin:    General: Skin is warm.     Findings: No rash.  Neurological:     Mental Status: She is alert and oriented to person, place, and time.  Psychiatric:        Behavior: Behavior normal.        Thought Content: Thought content normal.     ED Results / Procedures / Treatments   Labs (all labs ordered are listed, but only abnormal results are displayed) Labs Reviewed - No data to display  EKG None  Radiology DG Shoulder Right  Result Date: 05/15/2019 CLINICAL DATA:  RIGHT shoulder pain. No known injury. Initial encounter. EXAM: RIGHT SHOULDER - 2+ VIEW COMPARISON:  None. FINDINGS: No acute fracture, subluxation or dislocation. Mild AC joint degenerative changes noted. No other significant abnormality within the RIGHT shoulder. Cervical fusion hardware noted. IMPRESSION: No acute abnormality. Electronically Signed   By: Margarette Canada M.D.   On: 05/15/2019 14:30   CT Cervical Spine Wo Contrast  Result Date: 05/15/2019 CLINICAL DATA:  Neck pain, chronic also right shoulder pain. Pain shoots from shoulder into elbow denies history of injury. EXAM: CT CERVICAL SPINE WITHOUT  CONTRAST TECHNIQUE: Multidetector CT imaging of the cervical spine was performed without intravenous contrast. Multiplanar CT image  reconstructions were also generated. COMPARISON:  01/21/2018 FINDINGS: Alignment: Straightening of normal cervical lordosis due to spinal fusion spanning C6 through C7 as before. Skull base and vertebrae: No acute fracture. No primary bone lesion or focal pathologic process. Soft tissues and spinal canal: No prevertebral fluid or swelling. No visible canal hematoma. Disc levels: Right-sided neural foraminal narrowing at C3-C4 due to facet hypertrophy similar to the prior exam. Mild foraminal narrowing again at C4-C5. Bony fusion at C5 through C7. Moderate anterior osteophyte at C4-5. Upper chest: Negative. Other: None IMPRESSION: 1. No acute fracture or listhesis. 2. Bony fusion at C5 through C7 as before. 3. Right-sided neural foraminal narrowing at C3-C4 due to facet hypertrophy similar to the prior exam. Mild foraminal narrowing again at C4-C5. Electronically Signed   By: Zetta Bills M.D.   On: 05/15/2019 16:32    Procedures Procedures (including critical care time)  Medications Ordered in ED Medications  oxyCODONE-acetaminophen (PERCOCET/ROXICET) 5-325 MG per tablet 1 tablet (1 tablet Oral Given 05/15/19 1344)  orphenadrine (NORFLEX) injection 60 mg (60 mg Intramuscular Given 05/15/19 1432)    ED Course  I have reviewed the triage vital signs and the nursing notes.  Pertinent labs & imaging results that were available during my care of the patient were reviewed by me and considered in my medical decision making (see chart for details).    MDM Rules/Calculators/A&P                      69 year old female with right neck pain and right arm pain.  Pain radiating from the shoulder blade down to the radial aspect of the humerus into the dorsal aspect of the forearm.  Symptoms consistent with a cervical radiculopathy.  CT of the cervical spine shows no acute changes.  Hardware is stable from C3-C5 fusion.  She does have some stenosis present on the right side.  Discussed treatment options such as  oral anti-inflammatory medications, she is not interested in steroids.  Will take over-the-counter ibuprofen at home.  She is given prescription for tramadol and Flexeril as needed for additional pain relief.  She will follow-up with spine specialist in [facility] if pain has not improved in 1 week.  She understands signs and symptoms return to the ED for such as any increasing pain, weakness, fevers, worsening symptoms or urgent changes in her health. Final Clinical Impression(s) / ED Diagnoses Final diagnoses:  Acute pain of right shoulder  Right cervical radiculopathy  Cervical spondylosis with radiculopathy    Rx / DC Orders ED Discharge Orders         Ordered    traMADol (ULTRAM) 50 MG tablet  Every 6 hours PRN     05/15/19 1640    cyclobenzaprine (FLEXERIL) 5 MG tablet  3 times daily PRN     05/15/19 1640           Duanne Guess, PA-C 05/15/19 1647    Harvest Dark, MD 05/16/19 1244

## 2019-05-17 ENCOUNTER — Ambulatory Visit (INDEPENDENT_AMBULATORY_CARE_PROVIDER_SITE_OTHER): Payer: Medicare Other | Admitting: Pharmacist

## 2019-05-17 DIAGNOSIS — E1140 Type 2 diabetes mellitus with diabetic neuropathy, unspecified: Secondary | ICD-10-CM

## 2019-05-17 DIAGNOSIS — Z794 Long term (current) use of insulin: Secondary | ICD-10-CM

## 2019-05-17 NOTE — Chronic Care Management (AMB) (Signed)
Chronic Care Management   Follow Up Note   05/17/2019 Name: Alyssa Perkins MRN: [id number] DOB: January 10, 1951  Referred by: [name], DO Reason for referral : Chronic Care Management (Patient Phone Call)   TENEE WISH is a 69 y.o. year old female who is a primary care patient of [name], DO. The CCM team was consulted for assistance with chronic disease management and care coordination needs.  Alyssa Perkins has a past medical history including but not limited to type 2 diabetes mellitus, hypertension, OSA, depression, hyperlipidemia, chronic back pain and acid reflux.  Note patient seen in [facility] ED on 2/27 for right shoulder pain.  I reached out to Alyssa Perkins by phone today.   Review of patient status, including review of consultants reports, relevant laboratory and other test results, and collaboration with appropriate care team members and the patient's provider was performed as part of comprehensive patient evaluation and provision of chronic care management services.      Objective  Lab Results  Component Value Date   HGBA1C 7.5 (A) 05/10/2019   HGBA1C 14.0 (A) 10/21/2018   HGBA1C 10.9 (H) 09/02/2017     Outpatient Encounter Medications as of 05/17/2019  Medication Sig  . empagliflozin (JARDIANCE) 25 MG TABS tablet Take 25 mg by mouth daily with breakfast.  . Insulin Glargine, 1 Unit Dial, (TOUJEO SOLOSTAR) 300 UNIT/ML SOPN Inject 70 Units into the skin daily. (Patient taking differently: Inject 74 Units into the skin daily. )  . Semaglutide,0.25 or 0.5MG/DOS, (OZEMPIC, 0.25 OR 0.5 MG/DOSE,) 2 MG/1.5ML SOPN Inject 0.5 mg into the skin once a week.  . Blood Glucose Monitoring Suppl (ONETOUCH VERIO) w/Device KIT Use to check blood sugar up to 3 x daily  . cyclobenzaprine (FLEXERIL) 5 MG tablet Take 1-2 tablets (5-10 mg total) by mouth 3 (three) times daily as needed for muscle spasms.  . diclofenac  sodium (VOLTAREN) 1 % GEL Apply 2 g topically 3 (three) times daily as needed.  Regino Schultze Bandages & Supports (MEDICAL COMPRESSION STOCKINGS) MISC 1 each by Does not apply route daily.  . famotidine (PEPCID) 20 MG tablet Take 1 tablet (20 mg total) by mouth daily.  [name] gabapentin (NEURONTIN) 100 MG capsule Take 2 capsules (200 mg total) by mouth in the morning, at noon, and at bedtime.  . hydrOXYzine (ATARAX/VISTARIL) 25 MG tablet Take 1 tablet (25 mg total) by mouth daily as needed for itching. For itching  . ibuprofen (ADVIL,MOTRIN) 200 MG tablet Take 600-800 mg by mouth every 6 (six) hours as needed for moderate pain.  . Insulin Pen Needle 31G X 5 MM MISC 1 each by Does not apply route daily. Use to inject Toujeo daily. Dx:E11.9  . omeprazole (PRILOSEC) 20 MG capsule Take 1 capsule (20 mg total) by mouth daily.  . ondansetron (ZOFRAN-ODT) 4 MG disintegrating tablet Take 1 tablet (4 mg total) by mouth every 8 (eight) hours as needed for nausea or vomiting.  [name] Lancets 70W MISC Use to check blood sugar up to 3 x daily  . ONETOUCH VERIO test strip Check blood sugar up to 3 x daily  . quinapril (ACCUPRIL) 20 MG tablet Take 1 tablet (20 mg total) by mouth at bedtime.  . sertraline (ZOLOFT) 50 MG tablet Take 1 tablet (50 mg total) by mouth daily.  . simvastatin (ZOCOR) 40 MG tablet Take 1 tablet (40 mg total) by mouth at bedtime. Reported on 05/16/2015  . traMADol Veatrice Bourbon)  50 MG tablet Take 1 tablet (50 mg total) by mouth every 6 (six) hours as needed.  . traZODone (DESYREL) 50 MG tablet Take 1 tablet (50 mg total) by mouth at bedtime.  . [DISCONTINUED] OZEMPIC, 1 MG/DOSE, 2 MG/1.5ML SOPN Inject 1 mg into the skin once a week. (Patient not taking: Reported on 05/17/2019)  . [DISCONTINUED] ranitidine (ZANTAC) 300 MG tablet Take 1 tablet (300 mg total) by mouth daily.   No facility-administered encounter medications on file as of 05/17/2019.    Goals Addressed            This Visit's  Progress   . PharmD- I don't have a blood sugar meter (pt-stated)       Current Barriers:  [name] Knowledge deficits related to impact of diet on blood sugar and medications to manage blood sugar . Non Adherence to prescribed medication regimen . Financial Barriers . Lack of blood sugar results for clinical team  Pharmacist Clinical Goal(s):  [name] Over the next 30 days, patient will work with CM Pharmacist to address needs related to obtaining a blood glucose meter, medication adherence and medication regimen optimization  Interventions: . Perform chart review o Patient seen by PCP on 2/22 for f/u of diarrhea and type 2 diabetes mellitus - A1C drawn: 7.5% - For diabetes management, instructed to: [name] Increase Ozempic from 0.5 mg weekly up to 1 mg weekly (new Rx for Ozempic 1 mg sent to [facility]) . Reduce Toujeo from 76 units to 70 units daily, reduce down by 2 units every 1 week as long as fasting CGB <150 . Continue Jardiance 25 mg daily - Referrals placed to: . [facility]  . [facility]) o Patient seen by Endocrinologist on 2/25 for f/u for type 2 diabetes mellitus and instructed to: - Continue Toujeo 74 units each morning - Continue Ozempic 0.5 mg weekly on Friday - Continue Jardiance 25 mg daily o Patient seen in [facility] ED on 2/27 for right shoulder pain . Follow up with Alyssa Perkins regarding shoulder pain o Patient reports pain has gone down only a little bit o Caution patient regarding sedation and dizziness risk when taking cyclobenzaprine and tramadol, particularly in combination - Alyssa Perkins admits to sedation with these medications and confirms that she has been lying down when she takes them. o Reports currently resting in her recliner o Note ED provider recommended follow-up with spine specialist in [place] if pain has not improved in 1 week.  - Alyssa Perkins confirms having phone number for this specialist, Dr. Rolena Infante. [name] Counsel  on importance of blood sugar control and monitoring o Alyssa Perkins reports taking: - Toujeo 76 units each morning - Ozempic 0.5 mg weekly - Jardiance 25 mg once daily . Place call to [place]. Speak with Aniceto Boss who states Ozempic 1 mg Rx not yet shipped. Request pharamcy hold Rx. . Place coordination of care call to [facility]ology for medication reconcillation among providers of patient's Ozempic and Toujeo doses o Note patient out of Ozempic, next dose due 3/5 o Leave message with Caryl Pina in office . Coordination of care: Patient confirms having phone numbers for [facility] and [facility] to follow up for appointments from AVS from PCP visit  Patient Self Care Activities:  . Currently UNABLE to independently manage diabetes as evidenced by a recent A1C of 14% . Patient to check blood sugars as directed and keep log . Patient to attend provider appointments as scheduled  Please see past updates related to this goal by clicking on the "Past Updates" button in the selected goal         Plan  The care management team will reach out to the patient again over the next 1 days.   [name], PharmD, [place] [employer] [phone]

## 2019-05-17 NOTE — Patient Instructions (Signed)
Thank you allowing the Chronic Care Management Team to be a part of your care! It was a pleasure speaking with you today!     CCM (Chronic Care Management) Team    Noreene Larsson RN, MSN, CCM Nurse Care Coordinator  [phone]   [name] PharmD  Clinical Pharmacist  [phone]   Eula Fried LCSW Clinical Social Worker [phone]  Visit Information  Goals Addressed            This Visit's Progress   . PharmD- I don't have a blood sugar meter (pt-stated)       Current Barriers:  [name] Knowledge deficits related to impact of diet on blood sugar and medications to manage blood sugar . Non Adherence to prescribed medication regimen . Financial Barriers . Lack of blood sugar results for clinical team  Pharmacist Clinical Goal(s):  [name] Over the next 30 days, patient will work with CM Pharmacist to address needs related to obtaining a blood glucose meter, medication adherence and medication regimen optimization  Interventions: . Perform chart review o Patient seen by PCP on 2/22 for f/u of diarrhea and type 2 diabetes mellitus - A1C drawn: 7.5% - For diabetes management, instructed to: [name] Increase Ozempic from 0.5 mg weekly up to 1 mg weekly (new Rx for Ozempic 1 mg sent to [facility]) . Reduce Toujeo from 76 units to 70 units daily, reduce down by 2 units every 1 week as long as fasting CGB <150 . Continue Jardiance 25 mg daily - Referrals placed to: . [facility]  . [place] [facility]) o Patient seen by Endocrinologist on 2/25 for f/u for type 2 diabetes mellitus and instructed to: - Continue Toujeo 74 units each morning - Continue Ozempic 0.5 mg weekly on Friday - Continue Jardiance 25 mg daily o Patient seen in [facility] ED on 2/27 for right shoulder pain . Follow up with Ms. Milham regarding shoulder pain o Patient reports pain has gone down only a little bit o Caution patient regarding sedation and dizziness risk when  taking cyclobenzaprine and tramadol, particularly in combination - Ms. Hackel admits to sedation with these medications and confirms that she has been lying down when she takes them. o Reports currently resting in her recliner o Note ED provider recommended follow-up with spine specialist in [place] if pain has not improved in 1 week.  - Ms. Edwards confirms having phone number for this specialist, Dr. Rolena Infante. [name] Counsel on importance of blood sugar control and monitoring o Ms. Lasch reports taking: - Toujeo 76 units each morning - Ozempic 0.5 mg weekly - Jardiance 25 mg once daily . Place call to [place]. Speak with Aniceto Boss who states Ozempic 1 mg Rx not yet shipped. Request pharamcy hold Rx. . Place coordination of care call to [facility] Endocrinology for medication reconcillation among providers of patient's Ozempic and Toujeo doses o Note patient out of Ozempic, next dose due 3/5 o Leave message with Caryl Pina in office . Coordination of care: Patient confirms having phone numbers for [facility] and [facility] to follow up for appointments from AVS from PCP visit  Patient Self Care Activities:  . Currently UNABLE to independently manage diabetes as evidenced by a recent A1C of 14% . Patient to check blood sugars as directed and keep log . Patient to attend provider appointments as scheduled   Please see past updates related to this goal by clicking on the "Past Updates" button in the selected goal  Patient verbalizes understanding of instructions provided today.   The care management team will reach out to the patient again over the next 1 days.   [name], PharmD, [place] [employer] [phone]

## 2019-05-18 ENCOUNTER — Ambulatory Visit: Payer: Self-pay | Admitting: Pharmacist

## 2019-05-18 DIAGNOSIS — E1140 Type 2 diabetes mellitus with diabetic neuropathy, unspecified: Secondary | ICD-10-CM

## 2019-05-18 DIAGNOSIS — Z794 Long term (current) use of insulin: Secondary | ICD-10-CM | POA: Diagnosis not present

## 2019-05-18 NOTE — Chronic Care Management (AMB) (Signed)
Chronic Care Management   Follow Up Note   05/18/2019 Name: Alyssa Perkins MRN: [id number] DOB: 1950/04/27  Referred by: [name], DO Reason for referral : Chronic Care Management (Patient Phone Call)   Alyssa Perkins is a 69 y.o. year old female who is a primary care patient of [name], DO. The CCM team was consulted for assistance with chronic disease management and care coordination needs.  Alyssa Perkins has a past medical history including but not limited to type 2 diabetes mellitus, hypertension, OSA, depression, hyperlipidemia, chronic back pain and acid reflux.  I reached out to [name] by phone today.   Review of patient status, including review of consultants reports, relevant laboratory and other test results, and collaboration with appropriate care team members and the patient's provider was performed as part of comprehensive patient evaluation and provision of chronic care management services.      Outpatient Encounter Medications as of 05/18/2019  Medication Sig  . Insulin Glargine, 1 Unit Dial, (TOUJEO SOLOSTAR) 300 UNIT/ML SOPN Inject 70 Units into the skin daily. (Patient taking differently: Inject 74 Units into the skin daily. )  . Semaglutide,0.25 or 0.5MG/DOS, (OZEMPIC, 0.25 OR 0.5 MG/DOSE,) 2 MG/1.5ML SOPN Inject 0.5 mg into the skin once a week.  . Blood Glucose Monitoring Suppl (ONETOUCH VERIO) w/Device KIT Use to check blood sugar up to 3 x daily  . cyclobenzaprine (FLEXERIL) 5 MG tablet Take 1-2 tablets (5-10 mg total) by mouth 3 (three) times daily as needed for muscle spasms.  . diclofenac sodium (VOLTAREN) 1 % GEL Apply 2 g topically 3 (three) times daily as needed.  Regino Schultze Bandages & Supports (MEDICAL COMPRESSION STOCKINGS) MISC 1 each by Does not apply route daily.  . empagliflozin (JARDIANCE) 25 MG TABS tablet Take 25 mg by mouth daily with breakfast.  . famotidine (PEPCID) 20 MG tablet Take 1 tablet (20 mg  total) by mouth daily.  Alyssa Perkins gabapentin (NEURONTIN) 100 MG capsule Take 2 capsules (200 mg total) by mouth in the morning, at noon, and at bedtime.  . hydrOXYzine (ATARAX/VISTARIL) 25 MG tablet Take 1 tablet (25 mg total) by mouth daily as needed for itching. For itching  . ibuprofen (ADVIL,MOTRIN) 200 MG tablet Take 600-800 mg by mouth every 6 (six) hours as needed for moderate pain.  . Insulin Pen Needle 31G X 5 MM MISC 1 each by Does not apply route daily. Use to inject Toujeo daily. Dx:E11.9  . omeprazole (PRILOSEC) 20 MG capsule Take 1 capsule (20 mg total) by mouth daily.  . ondansetron (ZOFRAN-ODT) 4 MG disintegrating tablet Take 1 tablet (4 mg total) by mouth every 8 (eight) hours as needed for nausea or vomiting.  [name] Lancets 27C MISC Use to check blood sugar up to 3 x daily  . ONETOUCH VERIO test strip Check blood sugar up to 3 x daily  . quinapril (ACCUPRIL) 20 MG tablet Take 1 tablet (20 mg total) by mouth at bedtime.  . sertraline (ZOLOFT) 50 MG tablet Take 1 tablet (50 mg total) by mouth daily.  . simvastatin (ZOCOR) 40 MG tablet Take 1 tablet (40 mg total) by mouth at bedtime. Reported on 05/16/2015  . traMADol (ULTRAM) 50 MG tablet Take 1 tablet (50 mg total) by mouth every 6 (six) hours as needed.  . traZODone (DESYREL) 50 MG tablet Take 1 tablet (50 mg total) by mouth at bedtime.  . [DISCONTINUED] ranitidine (ZANTAC) 300 MG tablet Take 1 tablet (300  mg total) by mouth daily.   No facility-administered encounter medications on file as of 05/18/2019.    Goals Addressed            This Visit's Progress   . PharmD- I don't have a blood sugar meter (pt-stated)       Current Barriers:  Alyssa Perkins Knowledge deficits related to impact of diet on blood sugar and medications to manage blood sugar . Non Adherence to prescribed medication regimen . Financial Barriers . Lack of blood sugar results for clinical team  Pharmacist Clinical Goal(s):  Alyssa Perkins Over the next 30 days, patient  will work with CM Pharmacist to address needs related to obtaining a blood glucose meter, medication adherence and medication regimen optimization  Interventions: . Collaborate with PCP regarding patient's diabetes management . Perform chart review. Note in response to my message from yesterday, Ozempic 0.5 mg weekly sent to [place] by Endocrinologist this morning . Follow up with patient regarding her diabetes medication management o Ms. Templer reports that since we spoke yesterday, she reviewed medications in fridge again and does have one dose of Ozempic 0.5 mg remaining for this Friday. - Advise patient to follow up with [place] for next refill o Reports that she received refill of Toujeo from [place] via mail this morning. Myles Rosenthal on importance of medication adherence  Patient Self Care Activities:  . Currently UNABLE to independently manage diabetes as evidenced by a recent A1C of 14% . Patient to check blood sugars as directed and keep log . Patient to attend provider appointments as scheduled   Please see past updates related to this goal by clicking on the "Past Updates" button in the selected goal         Plan  The care management team will reach out to the patient again over the next 14 days.   [name], PharmD, [place] [employer] [phone]

## 2019-05-18 NOTE — Patient Instructions (Signed)
Thank you allowing the Chronic Care Management Team to be a part of your care! It was a pleasure speaking with you today!     CCM (Chronic Care Management) Team    Noreene Larsson RN, MSN, CCM Nurse Care Coordinator  [phone]   [name] PharmD  Clinical Pharmacist  [phone]   Eula Fried LCSW Clinical Social Worker [phone]  Visit Information  Goals Addressed            This Visit's Progress   . PharmD- I don't have a blood sugar meter (pt-stated)       Current Barriers:  [name] Knowledge deficits related to impact of diet on blood sugar and medications to manage blood sugar . Non Adherence to prescribed medication regimen . Financial Barriers . Lack of blood sugar results for clinical team  Pharmacist Clinical Goal(s):  [name] Over the next 30 days, patient will work with CM Pharmacist to address needs related to obtaining a blood glucose meter, medication adherence and medication regimen optimization  Interventions: . Collaborate with PCP regarding patient's diabetes management . Perform chart review. Note in response to my message from yesterday, Ozempic 0.5 mg weekly sent to [place] by Endocrinologist this morning . Follow up with patient regarding her diabetes medication management o Ms. Mckain reports that since we spoke yesterday, she reviewed medications in fridge again and does have one dose of Ozempic 0.5 mg remaining for this Friday. - Advise patient to follow up with [place] for next refill o Reports that she received refill of Toujeo from [place] via mail this morning. Myles Rosenthal on importance of medication adherence  Patient Self Care Activities:  . Currently UNABLE to independently manage diabetes as evidenced by a recent A1C of 14% . Patient to check blood sugars as directed and keep log . Patient to attend provider appointments as scheduled   Please see past updates related to this goal by clicking on the "Past  Updates" button in the selected goal         Patient verbalizes understanding of instructions provided today.   The care management team will reach out to the patient again over the next 14 days.   [name], PharmD, [place] [employer] [phone]

## 2019-05-31 ENCOUNTER — Telehealth: Payer: Self-pay

## 2019-05-31 ENCOUNTER — Ambulatory Visit: Payer: Self-pay | Admitting: Pharmacist

## 2019-05-31 NOTE — Chronic Care Management (AMB) (Signed)
  Chronic Care Management   Follow Up Note   05/31/2019 Name: Alyssa Perkins MRN: [id number] DOB: 1950/07/15  Referred by: [name], DO Reason for referral : Chronic Care Management (Patient Phone Call)   Alyssa Perkins is a 69 y.o. year old female who is a primary care patient of [name], DO. The CCM team was consulted for assistance with chronic disease management and care coordination needs.    Was unable to reach patient via telephone today and have left HIPAA compliant voicemail asking patient to return my call.   Plan  The care management team will reach out to the patient again over the next 30 days.   [name], PharmD, [place] [employer] [phone]

## 2019-06-21 ENCOUNTER — Telehealth: Payer: Medicare Other | Admitting: General Practice

## 2019-06-21 ENCOUNTER — Ambulatory Visit (INDEPENDENT_AMBULATORY_CARE_PROVIDER_SITE_OTHER): Payer: Medicare Other | Admitting: General Practice

## 2019-06-21 DIAGNOSIS — E785 Hyperlipidemia, unspecified: Secondary | ICD-10-CM | POA: Diagnosis not present

## 2019-06-21 DIAGNOSIS — E1140 Type 2 diabetes mellitus with diabetic neuropathy, unspecified: Secondary | ICD-10-CM

## 2019-06-21 DIAGNOSIS — E1169 Type 2 diabetes mellitus with other specified complication: Secondary | ICD-10-CM | POA: Diagnosis not present

## 2019-06-21 DIAGNOSIS — M545 Low back pain, unspecified: Secondary | ICD-10-CM

## 2019-06-21 DIAGNOSIS — Z794 Long term (current) use of insulin: Secondary | ICD-10-CM | POA: Diagnosis not present

## 2019-06-21 DIAGNOSIS — I10 Essential (primary) hypertension: Secondary | ICD-10-CM

## 2019-06-21 DIAGNOSIS — G8929 Other chronic pain: Secondary | ICD-10-CM

## 2019-06-21 NOTE — Chronic Care Management (AMB) (Signed)
Chronic Care Management   Follow Up Note   06/21/2019 Name: Alyssa Perkins MRN: [id number] DOB: 03/15/1951  Referred by: [name], DO Reason for referral : Chronic Care Management (RNCM Chronic Disease Management and Care Coordination Needs)   Alyssa Perkins is a 69 y.o. year old female who is a primary care patient of [name], DO. The CCM team was consulted for assistance with chronic disease management and care coordination needs.    Review of patient status, including review of consultants reports, relevant laboratory and other test results, and collaboration with appropriate care team members and the patient's provider was performed as part of comprehensive patient evaluation and provision of chronic care management services.    SDOH (Social Determinants of Health) assessments performed: Yes physical activity See Care Plan activities for detailed interventions related to [facility])     Outpatient Encounter Medications as of 06/21/2019  Medication Sig  . Blood Glucose Monitoring Suppl (ONETOUCH VERIO) w/Device KIT Use to check blood sugar up to 3 x daily  . cyclobenzaprine (FLEXERIL) 5 MG tablet Take 1-2 tablets (5-10 mg total) by mouth 3 (three) times daily as needed for muscle spasms.  . diclofenac sodium (VOLTAREN) 1 % GEL Apply 2 g topically 3 (three) times daily as needed.  Regino Schultze Bandages & Supports (MEDICAL COMPRESSION STOCKINGS) MISC 1 each by Does not apply route daily.  . empagliflozin (JARDIANCE) 25 MG TABS tablet Take 25 mg by mouth daily with breakfast.  . famotidine (PEPCID) 20 MG tablet Take 1 tablet (20 mg total) by mouth daily.  [name] gabapentin (NEURONTIN) 100 MG capsule Take 2 capsules (200 mg total) by mouth in the morning, at noon, and at bedtime.  . hydrOXYzine (ATARAX/VISTARIL) 25 MG tablet Take 1 tablet (25 mg total) by mouth daily as needed for itching. For itching  . ibuprofen (ADVIL,MOTRIN) 200 MG tablet Take 600-800 mg by  mouth every 6 (six) hours as needed for moderate pain.  . Insulin Glargine, 1 Unit Dial, (TOUJEO SOLOSTAR) 300 UNIT/ML SOPN Inject 70 Units into the skin daily. (Patient taking differently: Inject 74 Units into the skin daily. )  . Insulin Pen Needle 31G X 5 MM MISC 1 each by Does not apply route daily. Use to inject Toujeo daily. Dx:E11.9  . omeprazole (PRILOSEC) 20 MG capsule Take 1 capsule (20 mg total) by mouth daily.  . ondansetron (ZOFRAN-ODT) 4 MG disintegrating tablet Take 1 tablet (4 mg total) by mouth every 8 (eight) hours as needed for nausea or vomiting.  [name] Lancets 30Q MISC Use to check blood sugar up to 3 x daily  . ONETOUCH VERIO test strip Check blood sugar up to 3 x daily  . quinapril (ACCUPRIL) 20 MG tablet Take 1 tablet (20 mg total) by mouth at bedtime.  . Semaglutide,0.25 or 0.5MG/DOS, (OZEMPIC, 0.25 OR 0.5 MG/DOSE,) 2 MG/1.5ML SOPN Inject 0.5 mg into the skin once a week.  . sertraline (ZOLOFT) 50 MG tablet Take 1 tablet (50 mg total) by mouth daily.  . simvastatin (ZOCOR) 40 MG tablet Take 1 tablet (40 mg total) by mouth at bedtime. Reported on 05/16/2015  . traMADol (ULTRAM) 50 MG tablet Take 1 tablet (50 mg total) by mouth every 6 (six) hours as needed.  . traZODone (DESYREL) 50 MG tablet Take 1 tablet (50 mg total) by mouth at bedtime.  . [DISCONTINUED] ranitidine (ZANTAC) 300 MG tablet Take 1 tablet (300 mg total) by mouth daily.   No facility-administered encounter  medications on file as of 06/21/2019.     Objective:  BP Readings from Last 3 Encounters:  05/15/19 109/63  05/10/19 (!) 95/58  01/26/19 134/67    Goals Addressed            This Visit's Progress   . RNCM- I need help with my diabetes (pt-stated)       Current Barriers:  [name] Knowledge Deficits related to basic Diabetes pathophysiology and self care/management . Knowledge Deficits related to medications used for management of diabetes . Knowledge Deficits related to adherence of a  heart healthy/ADA diet . 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 daily monitoring and recording of CBG  . adherence to ADA/ carb modified diet . adherence to prescribed medication regimen  Interventions:  . Provided education to patient about basic DM disease process . Reviewed medications with patient and discussed importance of medication adherence . Discussed plans with patient for ongoing care management follow up and provided patient with direct contact information for care management team . Reviewed scheduled/upcoming provider appointments including: need to follow up with pcp, appointment to see podiatrist, endocrinology appointment  . Discussed the need to follow a Heart healthy/ADA diet and review of sugar substitutes  . Patient states she only takes her blood sugars 1 time a day and today was 150.  The patient states she ate ice cream last night.  The patient states she hasn't worked herself up to BID yet. Education on the importance of checking regularly to maintain health and well being . Evaluation of foot exams. The patient does not check daily, states she does need to see the podiatrist, advised to check feet daily for sores and cuts . Stated she still needed to go to the eye doctor  Patient Self Care Activities:  . UNABLE to independently manage diabetes as evidenced by a recent A1C of 14% on 10/21/18, per the patient she has not had any new readings.   Please see past updates related to this goal by clicking on the "Past Updates" button in the selected goal       . RNCM: "I don't check my blood pressure" (pt-stated)       [place] (see longtitudinal plan of care for additional care plan information)  Current Barriers:   . Chronic Disease Management support, education, and care coordination needs related to HTN, HLD, and Chronic pain  Clinical Goal(s) related to HTN, HLD, and Chronic pain :  Over the next 120 days, patient will:  . Work with the care management team to address educational, disease management, and care coordination needs  . Begin or continue self health monitoring activities as directed today Measure and record blood pressure 2 times per week and adhere to heart healthy/ADA diet . Call provider office for new or worsened signs and symptoms Blood pressure findings outside established parameters, Shortness of breath, and New or worsened symptom related to HLD, Chronic pain and other Chronic health conditions . Call care management team with questions or concerns . Verbalize basic understanding of patient centered plan of care established today  Interventions related to HTN, HLD, and Chronic pain :  . Evaluation of current treatment plans and patient's adherence to plan as established by provider . Assessed patient understanding of disease states . Assessed patient's education  and care coordination needs . Assessed patients pain level. The patient verbalized it is at a 3 today and much better than it has been.  . Provided disease specific education to patient.  Education on the benefits of taking blood pressures weekly and recording.  . Evaluation of patients physical activity level: the patient states she has been out working in her yard and mowing her yard. Denies any acute distress.  Nash Dimmer with appropriate clinical care team members regarding patient needs  Patient Self Care Activities related to HTN, HLD, and Chronic pain :  . Patient is unable to independently self-manage chronic health conditions  Initial goal documentation         Plan:   The care management team will reach out to the patient again over the next 60 days.    Noreene Larsson RN, MSN, [place] [place] Mobile: [phone]

## 2019-06-21 NOTE — Patient Instructions (Signed)
Visit Information  Goals Addressed            This Visit's Progress    RNCM- I need help with my diabetes (pt-stated)       Current Barriers:   Knowledge Deficits related to basic Diabetes pathophysiology and self care/management  Knowledge Deficits related to medications used for management of diabetes  Knowledge Deficits related to adherence of a heart healthy/ADA diet  Difficulty obtaining or cannot afford medications  Astronomer barriers  Case Manager Clinical Goal(s):  Over the next 120 days, patient will demonstrate improved adherence to prescribed treatment plan for diabetes self care/management as evidenced by:   daily monitoring and recording of CBG   adherence to ADA/ carb modified diet  adherence to prescribed medication regimen  Interventions:   Provided education to patient about basic DM disease process  Reviewed medications with patient and discussed importance of medication adherence  Discussed plans with patient for ongoing care management follow up and provided patient with direct contact information for care management team  Reviewed scheduled/upcoming provider appointments including: need to follow up with pcp, appointment to see podiatrist, endocrinology appointment   Discussed the need to follow a Heart healthy/ADA diet and review of sugar substitutes   Patient states she only takes her blood sugars 1 time a day and today was 150.  The patient states she ate ice cream last night.  The patient states she hasn't worked herself up to BID yet. Education on the importance of checking regularly to maintain health and well being  Evaluation of foot exams. The patient does not check daily, states she does need to see the podiatrist, advised to check feet daily for sores and cuts  Stated she still needed to go to the eye doctor  Patient Self Care Activities:   UNABLE to independently manage diabetes as evidenced by a recent  A1C of 14% on 10/21/18, per the patient she has not had any new readings.   Please see past updates related to this goal by clicking on the "Past Updates" button in the selected goal        RNCM: "I don't check my blood pressure" (pt-stated)       CARE PLAN ENTRY (see longtitudinal plan of care for additional care plan information)  Current Barriers:   Chronic Disease Management support, education, and care coordination needs related to HTN, HLD, and Chronic pain  Clinical Goal(s) related to HTN, HLD, and Chronic pain :  Over the next 120 days, patient will:   Work with the care management team to address educational, disease management, and care coordination needs   Begin or continue self health monitoring activities as directed today Measure and record blood pressure 2 times per week and adhere to heart healthy/ADA diet  Call provider office for new or worsened signs and symptoms Blood pressure findings outside established parameters, Shortness of breath, and New or worsened symptom related to HLD, Chronic pain and other Chronic health conditions  Call care management team with questions or concerns  Verbalize basic understanding of patient centered plan of care established today  Interventions related to HTN, HLD, and Chronic pain :   Evaluation of current treatment plans and patient's adherence to plan as established by provider  Assessed patient understanding of disease states  Assessed patient's education and care coordination needs  Assessed patients pain level. The patient verbalized it is at a 3 today and much better than it has been.   Provided disease  specific education to patient.  Education on the benefits of taking blood pressures weekly and recording.   Evaluation of patients physical activity level: the patient states she has been out working in her yard and mowing her yard. Denies any acute distress.   Collaborated with appropriate clinical care team members  regarding patient needs  Patient Self Care Activities related to HTN, HLD, and Chronic pain :   Patient is unable to independently self-manage chronic health conditions  Initial goal documentation        Patient verbalizes understanding of instructions provided today.   The care management team will reach out to the patient again over the next 60 days.   Noreene Larsson RN, MSN, [place] Mobile: [phone]

## 2019-06-23 ENCOUNTER — Ambulatory Visit: Payer: Medicare Other | Admitting: Pharmacist

## 2019-06-23 DIAGNOSIS — E785 Hyperlipidemia, unspecified: Secondary | ICD-10-CM | POA: Diagnosis not present

## 2019-06-23 DIAGNOSIS — Z794 Long term (current) use of insulin: Secondary | ICD-10-CM | POA: Diagnosis not present

## 2019-06-23 DIAGNOSIS — E1140 Type 2 diabetes mellitus with diabetic neuropathy, unspecified: Secondary | ICD-10-CM | POA: Diagnosis not present

## 2019-06-23 DIAGNOSIS — E1169 Type 2 diabetes mellitus with other specified complication: Secondary | ICD-10-CM | POA: Diagnosis not present

## 2019-06-23 DIAGNOSIS — I10 Essential (primary) hypertension: Secondary | ICD-10-CM | POA: Diagnosis not present

## 2019-06-23 NOTE — Chronic Care Management (AMB) (Signed)
Chronic Care Management   Follow Up Note   06/23/2019 Name: Alyssa Perkins MRN: [id number] DOB: 11/13/1950  Referred by: [name], DO Reason for referral : Chronic Care Management (Patient Phone Call)   Alyssa Perkins is a 69 y.o. year old female who is a primary care patient of [name], DO. The CCM team was consulted for assistance with chronic disease management and care coordination needs.  Ms. Kostelecky has a past medical history including but not limited to type 2 diabetes mellitus, hypertension, OSA, depression, hyperlipidemia, chronic back pain and acid reflux.  I reached out to [name] by phone today.   Review of patient status, including review of consultants reports, relevant laboratory and other test results, and collaboration with appropriate care team members and the patient's provider was performed as part of comprehensive patient evaluation and provision of chronic care management services.     Outpatient Encounter Medications as of 06/23/2019  Medication Sig  . Insulin Glargine, 1 Unit Dial, (TOUJEO SOLOSTAR) 300 UNIT/ML SOPN Inject 70 Units into the skin daily. (Patient taking differently: Inject 76 Units into the skin daily. )  . Semaglutide,0.25 or 0.5MG/DOS, (OZEMPIC, 0.25 OR 0.5 MG/DOSE,) 2 MG/1.5ML SOPN Inject 0.5 mg into the skin once a week.  . Blood Glucose Monitoring Suppl (ONETOUCH VERIO) w/Device KIT Use to check blood sugar up to 3 x daily  . cyclobenzaprine (FLEXERIL) 5 MG tablet Take 1-2 tablets (5-10 mg total) by mouth 3 (three) times daily as needed for muscle spasms.  . diclofenac sodium (VOLTAREN) 1 % GEL Apply 2 g topically 3 (three) times daily as needed.  Regino Schultze Bandages & Supports (MEDICAL COMPRESSION STOCKINGS) MISC 1 each by Does not apply route daily.  . empagliflozin (JARDIANCE) 25 MG TABS tablet Take 25 mg by mouth daily with breakfast.  . famotidine (PEPCID) 20 MG tablet Take 1 tablet (20 mg  total) by mouth daily.  [name] gabapentin (NEURONTIN) 100 MG capsule Take 2 capsules (200 mg total) by mouth in the morning, at noon, and at bedtime.  . hydrOXYzine (ATARAX/VISTARIL) 25 MG tablet Take 1 tablet (25 mg total) by mouth daily as needed for itching. For itching  . ibuprofen (ADVIL,MOTRIN) 200 MG tablet Take 600-800 mg by mouth every 6 (six) hours as needed for moderate pain.  . Insulin Pen Needle 31G X 5 MM MISC 1 each by Does not apply route daily. Use to inject Toujeo daily. Dx:E11.9  . omeprazole (PRILOSEC) 20 MG capsule Take 1 capsule (20 mg total) by mouth daily.  . ondansetron (ZOFRAN-ODT) 4 MG disintegrating tablet Take 1 tablet (4 mg total) by mouth every 8 (eight) hours as needed for nausea or vomiting.  [name] Lancets 90W MISC Use to check blood sugar up to 3 x daily  . ONETOUCH VERIO test strip Check blood sugar up to 3 x daily  . quinapril (ACCUPRIL) 20 MG tablet Take 1 tablet (20 mg total) by mouth at bedtime.  . sertraline (ZOLOFT) 50 MG tablet Take 1 tablet (50 mg total) by mouth daily.  . simvastatin (ZOCOR) 40 MG tablet Take 1 tablet (40 mg total) by mouth at bedtime. Reported on 05/16/2015  . traMADol (ULTRAM) 50 MG tablet Take 1 tablet (50 mg total) by mouth every 6 (six) hours as needed.  . traZODone (DESYREL) 50 MG tablet Take 1 tablet (50 mg total) by mouth at bedtime.  . [DISCONTINUED] ranitidine (ZANTAC) 300 MG tablet Take 1 tablet (300 mg  total) by mouth daily.   No facility-administered encounter medications on file as of 06/23/2019.    Goals Addressed            This Visit's Progress   . PharmD- I don't have a blood sugar meter (pt-stated)       Current Barriers:  [name] Knowledge deficits related to impact of diet on blood sugar and medications to manage blood sugar . Non Adherence to prescribed medication regimen . Financial Barriers . Lack of blood sugar results for clinical team  Pharmacist Clinical Goal(s):  [name] Over the next 30 days, patient  will work with CM Pharmacist to address needs related to obtaining a blood glucose meter, medication adherence and medication regimen optimization  Interventions: . Counsel on importance of blood sugar control and monitoring o Reports currently taking: - Toujeo 76 units once daily - Ozempic 0.5 mg weekly o Reports NOT taking Jardiance. Reviews medications with sister, who is currently home with patient - unable to locate Jardiance and unsure of when she last refilled - states she believes last received from [facility], but does not believe sent to [place] o Review recent CBG results (see below) - Denies checking CBGs before bed; encourage patient to start also checking CBGs before bed as directed by Endocrinologist o Discuss impact of carbohydrate portion sizes on blood sugar. Patient states that she has noticed this when she has eaten ice cream at bedtime . Discuss importance of medication adherence . Advise patient to f/u with Endocrinology office today regarding Jardiance Rx and provide recent CBGs o Patient confirms having contact info and states she will call now  Patient Self Care Activities:  . Patient to check blood sugars twice daily as directed and keep log  Morning Fasting CBG  1 - April 131  2 - April 133  3 - April -  4 - April 91  5 - April 150  6 - April 117  7 - April 131  Average 126   . Patient to attend provider appointments as scheduled   Please see past updates related to this goal by clicking on the "Past Updates" button in the selected goal         Plan  The care management team will reach out to the patient again over the next 14 days.   [name], PharmD, [place] [employer] [phone]

## 2019-06-23 NOTE — Patient Instructions (Signed)
Thank you allowing the Chronic Care Management Team to be a part of your care! It was a pleasure speaking with you today!     CCM (Chronic Care Management) Team    Noreene Larsson RN, MSN, CCM Nurse Care Coordinator  [phone]   [name] PharmD  Clinical Pharmacist  [phone]   Eula Fried LCSW Clinical Social Worker [phone]  Visit Information  Goals Addressed            This Visit's Progress   . PharmD- I don't have a blood sugar meter (pt-stated)       Current Barriers:  [name] Knowledge deficits related to impact of diet on blood sugar and medications to manage blood sugar . Non Adherence to prescribed medication regimen . Financial Barriers . Lack of blood sugar results for clinical team  Pharmacist Clinical Goal(s):  [name] Over the next 30 days, patient will work with CM Pharmacist to address needs related to obtaining a blood glucose meter, medication adherence and medication regimen optimization  Interventions: . Counsel on importance of blood sugar control and monitoring o Reports currently taking: - Toujeo 76 units once daily - Ozempic 0.5 mg weekly o Reports NOT taking Jardiance. Reviews medications with sister, who is currently home with patient - unable to locate Jardiance and unsure of when she last refilled - states she believes last received from [facility], but does not believe sent to [place] o Review recent CBG results (see below) - Denies checking CBGs before bed; encourage patient to start also checking CBGs before bed as directed by Endocrinologist o Discuss impact of carbohydrate portion sizes on blood sugar. Patient states that she has noticed this when she has eaten ice cream at bedtime . Discuss importance of medication adherence . Advise patient to f/u with Endocrinology office today regarding Jardiance Rx and provide recent CBGs o Patient confirms having contact info and states she will call now  Patient Self Care  Activities:  . Patient to check blood sugars twice daily as directed and keep log  Morning Fasting CBG  1 - April 131  2 - April 133  3 - April -  4 - April 91  5 - April 150  6 - April 117  7 - April 131  Average 126   . Patient to attend provider appointments as scheduled   Please see past updates related to this goal by clicking on the "Past Updates" button in the selected goal         Patient verbalizes understanding of instructions provided today.   The care management team will reach out to the patient again over the next 14 days.   [name], PharmD, [place] [employer] [phone]

## 2019-07-07 ENCOUNTER — Ambulatory Visit: Payer: Medicare Other | Admitting: Pharmacist

## 2019-07-07 DIAGNOSIS — E1140 Type 2 diabetes mellitus with diabetic neuropathy, unspecified: Secondary | ICD-10-CM

## 2019-07-07 DIAGNOSIS — Z794 Long term (current) use of insulin: Secondary | ICD-10-CM

## 2019-07-07 NOTE — Chronic Care Management (AMB) (Signed)
Chronic Care Management   Follow Up Note   07/07/2019 Name: Alyssa Perkins MRN: [id number] DOB: Mar 02, 1951  Referred by: [name], DO Reason for referral : Chronic Care Management (Patient Phone Call)   Alyssa Perkins is a 69 y.o. year old female who is a primary care patient of [name], DO. The CCM team was consulted for assistance with chronic disease management and care coordination needs.  [name] has a past medical history including but not limited to type 2 diabetes mellitus, hypertension, OSA, depression, hyperlipidemia, chronic back pain and acid reflux.  I reached out to [name] by phone today.   Review of patient status, including review of consultants reports, relevant laboratory and other test results, and collaboration with appropriate care team members and the patient's provider was performed as part of comprehensive patient evaluation and provision of chronic care management services.     Outpatient Encounter Medications as of 07/07/2019  Medication Sig  . empagliflozin (JARDIANCE) 25 MG TABS tablet Take 25 mg by mouth daily with breakfast.  . Insulin Glargine, 1 Unit Dial, (TOUJEO SOLOSTAR) 300 UNIT/ML SOPN Inject 70 Units into the skin daily. (Patient taking differently: Inject 76 Units into the skin daily. )  . Blood Glucose Monitoring Suppl (ONETOUCH VERIO) w/Device KIT Use to check blood sugar up to 3 x daily  . cyclobenzaprine (FLEXERIL) 5 MG tablet Take 1-2 tablets (5-10 mg total) by mouth 3 (three) times daily as needed for muscle spasms.  . diclofenac sodium (VOLTAREN) 1 % GEL Apply 2 g topically 3 (three) times daily as needed.  Alyssa Perkins Bandages & Supports (MEDICAL COMPRESSION STOCKINGS) MISC 1 each by Does not apply route daily.  . famotidine (PEPCID) 20 MG tablet Take 1 tablet (20 mg total) by mouth daily.  [name] gabapentin (NEURONTIN) 100 MG capsule Take 2 capsules (200 mg total) by mouth in the morning,  at noon, and at bedtime.  . hydrOXYzine (ATARAX/VISTARIL) 25 MG tablet Take 1 tablet (25 mg total) by mouth daily as needed for itching. For itching  . ibuprofen (ADVIL,MOTRIN) 200 MG tablet Take 600-800 mg by mouth every 6 (six) hours as needed for moderate pain.  . Insulin Pen Needle 31G X 5 MM MISC 1 each by Does not apply route daily. Use to inject Toujeo daily. Dx:E11.9  . omeprazole (PRILOSEC) 20 MG capsule Take 1 capsule (20 mg total) by mouth daily.  . ondansetron (ZOFRAN-ODT) 4 MG disintegrating tablet Take 1 tablet (4 mg total) by mouth every 8 (eight) hours as needed for nausea or vomiting.  [name] Lancets 24M MISC Use to check blood sugar up to 3 x daily  . ONETOUCH VERIO test strip Check blood sugar up to 3 x daily  . quinapril (ACCUPRIL) 20 MG tablet Take 1 tablet (20 mg total) by mouth at bedtime.  . Semaglutide,0.25 or 0.5MG/DOS, (OZEMPIC, 0.25 OR 0.5 MG/DOSE,) 2 MG/1.5ML SOPN Inject 0.5 mg into the skin once a week.  . sertraline (ZOLOFT) 50 MG tablet Take 1 tablet (50 mg total) by mouth daily.  . simvastatin (ZOCOR) 40 MG tablet Take 1 tablet (40 mg total) by mouth at bedtime. Reported on 05/16/2015  . traMADol (ULTRAM) 50 MG tablet Take 1 tablet (50 mg total) by mouth every 6 (six) hours as needed.  . traZODone (DESYREL) 50 MG tablet Take 1 tablet (50 mg total) by mouth at bedtime.  . [DISCONTINUED] ranitidine (ZANTAC) 300 MG tablet Take 1 tablet (300 mg  total) by mouth daily.   No facility-administered encounter medications on file as of 07/07/2019.    Goals Addressed            This Visit's Progress   . PharmD- I don't have a blood sugar meter (pt-stated)       Current Barriers:  [name] Knowledge deficits related to impact of diet on blood sugar and medications to manage blood sugar . Non Adherence to prescribed medication regimen . Financial Barriers . Lack of blood sugar results for clinical team  Pharmacist Clinical Goal(s):  [name] Over the next 30 days,  patient will work with CM Pharmacist to address needs related to obtaining a blood glucose meter, medication adherence and medication regimen optimization  Interventions: . Counsel on importance of blood sugar control and monitoring o Reports currently taking: - Toujeo 76 units once daily - Jardiance 25 mg once daily o Note patient previously out of Jardiance, but reports has been taking as directed since received from [place] now out of Ozempic 0.5 mg. Reports ran out ~2 weeks ago. Reports called [facility] for refill when realized out, but has not yet arrived. o Reports CBG this morning: 168  - Reports reading higher as she had ice cream night before o Denies having checked CBGs earlier in the week as she was not feeling well - Encourage patient to follow up with Endocrinology for any new conditions/illness effecting hydration or her ability to eat to see if adjustments need to be made to medications o Encourage patient to check CBGs as directed by Endocrinologist . Counsel patient to follow up with [facility] now to determine when Rx to arrive.  o Patient states that she will call now o Encourage atient to follow up with Endocrinologist if needed to request 30 day supply to local pharmacy . Discuss importance of medication adherence o Counsel on importance of planning ahead for refills  . Counsel patient on s/s of low blood sugar and how to manage lows  o Encourage patient to follow up with Endocrinology if having low blood sugars  Patient Self Care Activities:  . Patient to check blood sugars twice daily as directed and keep log . Patient to attend provider appointments as scheduled   Please see past updates related to this goal by clicking on the "Past Updates" button in the selected goal         Plan  The care management team will reach out to the patient again over the next 14 days.   [name], PharmD, [place]  [employer] [phone]

## 2019-07-07 NOTE — Patient Instructions (Signed)
Thank you allowing the Chronic Care Management Team to be a part of your care! It was a pleasure speaking with you today!     CCM (Chronic Care Management) Team    Noreene Larsson RN, MSN, CCM Nurse Care Coordinator  [phone]   [name] PharmD  Clinical Pharmacist  [phone]   Eula Fried LCSW Clinical Social Worker [phone]  Visit Information  Goals Addressed            This Visit's Progress   . PharmD- I don't have a blood sugar meter (pt-stated)       Current Barriers:  [name] Knowledge deficits related to impact of diet on blood sugar and medications to manage blood sugar . Non Adherence to prescribed medication regimen . Financial Barriers . Lack of blood sugar results for clinical team  Pharmacist Clinical Goal(s):  [name] Over the next 30 days, patient will work with CM Pharmacist to address needs related to obtaining a blood glucose meter, medication adherence and medication regimen optimization  Interventions: . Counsel on importance of blood sugar control and monitoring o Reports currently taking: - Toujeo 76 units once daily - Jardiance 25 mg once daily o Note patient previously out of Jardiance, but reports has been taking as directed since received from [place] now out of Ozempic 0.5 mg. Reports ran out ~2 weeks ago. Reports called [facility] for refill when realized out, but has not yet arrived. o Reports CBG this morning: 168  - Reports reading higher as she had ice cream night before o Denies having checked CBGs earlier in the week as she was not feeling well - Encourage patient to follow up with Endocrinology for any new conditions/illness effecting hydration or her ability to eat to see if adjustments need to be made to medications o Encourage patient to check CBGs as directed by Endocrinologist . Counsel patient to follow up with [facility] now to determine when Rx to arrive.  o Patient states that she will call now o Encourage  atient to follow up with Endocrinologist if needed to request 30 day supply to local pharmacy . Discuss importance of medication adherence o Counsel on importance of planning ahead for refills  . Counsel patient on s/s of low blood sugar and how to manage lows  o Encourage patient to follow up with Endocrinology if having low blood sugars  Patient Self Care Activities:  . Patient to check blood sugars twice daily as directed and keep log . Patient to attend provider appointments as scheduled   Please see past updates related to this goal by clicking on the "Past Updates" button in the selected goal         Patient verbalizes understanding of instructions provided today.   The care management team will reach out to the patient again over the next 14 days.   [name], PharmD, [place] [employer] [phone]

## 2019-07-19 ENCOUNTER — Ambulatory Visit: Payer: Self-pay | Admitting: *Deleted

## 2019-07-19 ENCOUNTER — Ambulatory Visit: Payer: Medicare Other | Admitting: Pharmacist

## 2019-07-19 DIAGNOSIS — E1140 Type 2 diabetes mellitus with diabetic neuropathy, unspecified: Secondary | ICD-10-CM

## 2019-07-19 DIAGNOSIS — Z794 Long term (current) use of insulin: Secondary | ICD-10-CM

## 2019-07-19 NOTE — Telephone Encounter (Signed)
Patient is calling to report she is having severe abdominal pain LUQ and she has been vomiting and having diarrhea. Patient states she has not eaten/kept food down since Saturday. Strongly encouraged ED- but patient does not have transportation and she states she does not do ambulance.Told patient she should not wait and call 911 if she gets worse.  Reason for Disposition . [1] SEVERE pain (e.g., excruciating) AND [2] present > 1 hour  Answer Assessment - Initial Assessment Questions 1. LOCATION: "Where does it hurt?"      L of midline 2. RADIATION: "Does the pain shoot anywhere else?" (e.g., chest, back)     Stomach straight to back 3. ONSET: "When did the pain begin?" (e.g., minutes, hours or days ago)      Yesterday got bad- 2-3 days 4. SUDDEN: "Gradual or sudden onset?"     gradually- hit yesterday bad 5. PATTERN "Does the pain come and go, or is it constant?"    - If constant: "Is it getting better, staying the same, or worsening?"      (Note: Constant means the pain never goes away completely; most serious pain is constant and it progresses)     - If intermittent: "How long does it last?" "Do you have pain now?"     (Note: Intermittent means the pain goes away completely between bouts)     constant 6. SEVERITY: "How bad is the pain?"  (e.g., Scale 1-10; mild, moderate, or severe)   - MILD (1-3): doesn't interfere with normal activities, abdomen soft and not tender to touch    - MODERATE (4-7): interferes with normal activities or awakens from sleep, tender to touch    - SEVERE (8-10): excruciating pain, doubled over, unable to do any normal activities      Severe- 8 7. RECURRENT SYMPTOM: "Have you ever had this type of abdominal pain before?" If so, ask: "When was the last time?" and "What happened that time?"      Yes- not seen 8. CAUSE: "What do you think is causing the abdominal pain?"     no 9. RELIEVING/AGGRAVATING FACTORS: "What makes it better or worse?" (e.g., movement,  antacids, bowel movement)     no 10. OTHER SYMPTOMS: "Has there been any vomiting, diarrhea, constipation, or urine problems?"       Vomiting, diarrhea 11. PREGNANCY: "Is there any chance you are pregnant?" "When was your last menstrual period?"       n/a  Protocols used: ABDOMINAL PAIN - [facility]

## 2019-07-19 NOTE — Chronic Care Management (AMB) (Signed)
Chronic Care Management   Follow Up Note   07/19/2019 Name: Alyssa Perkins MRN: [id number] DOB: [birth year]  Referred by: [name], DO Reason for referral : Chronic Care Management (Patient Phone Call)   Alyssa Perkins is a 69 y.o. year old female who is a primary care patient of [name], DO. The CCM team was consulted for assistance with chronic disease management and care coordination needs. Ms. Henry has a past medical history including but not limited to type 2 diabetes mellitus, hypertension, OSA, depression, hyperlipidemia, chronic back pain and acid reflux.    I reached out to [name] by phone today.   Review of patient status, including review of consultants reports, relevant laboratory and other test results, and collaboration with appropriate care team members and the patient's provider was performed as part of comprehensive patient evaluation and provision of chronic care management services.     Outpatient Encounter Medications as of 07/19/2019  Medication Sig  . empagliflozin (JARDIANCE) 25 MG TABS tablet Take 25 mg by mouth daily with breakfast.  . Insulin Glargine, 1 Unit Dial, (TOUJEO SOLOSTAR) 300 UNIT/ML SOPN Inject 70 Units into the skin daily. (Patient taking differently: Inject 76 Units into the skin daily. )  . Semaglutide,0.25 or 0.'5MG'$ /DOS, (OZEMPIC, 0.25 OR 0.5 MG/DOSE,) 2 MG/1.5ML SOPN Inject 0.5 mg into the skin once a week.  . Blood Glucose Monitoring Suppl (ONETOUCH VERIO) w/Device KIT Use to check blood sugar up to 3 x daily  . cyclobenzaprine (FLEXERIL) 5 MG tablet Take 1-2 tablets (5-10 mg total) by mouth 3 (three) times daily as needed for muscle spasms.  . diclofenac sodium (VOLTAREN) 1 % GEL Apply 2 g topically 3 (three) times daily as needed.  Regino Schultze Bandages & Supports (MEDICAL COMPRESSION STOCKINGS) MISC 1 each by Does not apply route daily.  . famotidine (PEPCID) 20 MG tablet Take 1 tablet (20 mg  total) by mouth daily.  [name] gabapentin (NEURONTIN) 100 MG capsule Take 2 capsules (200 mg total) by mouth in the morning, at noon, and at bedtime.  . hydrOXYzine (ATARAX/VISTARIL) 25 MG tablet Take 1 tablet (25 mg total) by mouth daily as needed for itching. For itching  . ibuprofen (ADVIL,MOTRIN) 200 MG tablet Take 600-800 mg by mouth every 6 (six) hours as needed for moderate pain.  . Insulin Pen Needle 31G X 5 MM MISC 1 each by Does not apply route daily. Use to inject Toujeo daily. Dx:E11.9  . omeprazole (PRILOSEC) 20 MG capsule Take 1 capsule (20 mg total) by mouth daily.  . ondansetron (ZOFRAN-ODT) 4 MG disintegrating tablet Take 1 tablet (4 mg total) by mouth every 8 (eight) hours as needed for nausea or vomiting.  [name] Lancets 34H MISC Use to check blood sugar up to 3 x daily  . ONETOUCH VERIO test strip Check blood sugar up to 3 x daily  . quinapril (ACCUPRIL) 20 MG tablet Take 1 tablet (20 mg total) by mouth at bedtime.  . sertraline (ZOLOFT) 50 MG tablet Take 1 tablet (50 mg total) by mouth daily.  . simvastatin (ZOCOR) 40 MG tablet Take 1 tablet (40 mg total) by mouth at bedtime. Reported on 05/16/2015  . traMADol (ULTRAM) 50 MG tablet Take 1 tablet (50 mg total) by mouth every 6 (six) hours as needed.  . traZODone (DESYREL) 50 MG tablet Take 1 tablet (50 mg total) by mouth at bedtime.  . [DISCONTINUED] ranitidine (ZANTAC) 300 MG tablet Take 1 tablet (300  mg total) by mouth daily.   No facility-administered encounter medications on file as of 07/19/2019.    Goals Addressed            This Visit's Progress   . PharmD- Medication Adherence (pt-stated)       Current Barriers:  [name] Knowledge deficits related to impact of diet on blood sugar and medications to manage blood sugar . Non Adherence to prescribed medication regimen . Financial Barriers . Lack of blood sugar results for clinical team  Pharmacist Clinical Goal(s):  [name] Over the next 30 days, patient will work  with CM Pharmacist to address needs related to obtaining a blood glucose meter, medication adherence and medication regimen optimization  Interventions: . Advise patient to call to follow up with PCP office now for advice on current illness o Patient reports has been sick since yesterday afternoon with vomiting, fever, chills, diarrhea, body aches and pain. o Ms. Martinezgarcia confirms has phone number for office and states that she will call now. . Encourage patient to continue to monitor CBGs closely particularly while sick and follow up with Endocrinologist for readings outside of established parameters o Reports currently taking: - Toujeo 76 units once daily - Jardiance 25 mg once daily - Ozempic 0.5 mg weekly (reports restarted on Friday) o Note patient previously reported ran out of Ozempic o Reports has noticed CBGs running a little higher since yesterday. Reports CBG 151 this morning o Denies any low blood sugars . Will collaborate with PCP  Patient Self Care Activities:  . Patient to check blood sugars twice daily as directed and keep log . Patient to attend provider appointments as scheduled   Please see past updates related to this goal by clicking on the "Past Updates" button in the selected goal         Plan  1) Patient advised to call PCP office now regarding current illness. 2) The care management team will reach out to the patient again over the next 30 days.   Alyssa Perkins, PharmD, [place] [employer] [phone]

## 2019-07-19 NOTE — Patient Instructions (Signed)
Thank you allowing the Chronic Care Management Team to be a part of your care! It was a pleasure speaking with you today!     CCM (Chronic Care Management) Team    Noreene Larsson RN, MSN, CCM Nurse Care Coordinator  [phone]   [name] PharmD  Clinical Pharmacist  [phone]   Eula Fried LCSW Clinical Social Worker [phone]  Visit Information  Goals Addressed            This Visit's Progress   . PharmD- Medication Adherence (pt-stated)       Current Barriers:  [name] Knowledge deficits related to impact of diet on blood sugar and medications to manage blood sugar . Non Adherence to prescribed medication regimen . Financial Barriers . Lack of blood sugar results for clinical team  Pharmacist Clinical Goal(s):  [name] Over the next 30 days, patient will work with CM Pharmacist to address needs related to obtaining a blood glucose meter, medication adherence and medication regimen optimization  Interventions: . Advise patient to call to follow up with PCP office now for advice on current illness o Patient reports has been sick since yesterday afternoon with vomiting, fever, chills, diarrhea, body aches and pain. o Ms. Keay confirms has phone number for office and states that she will call now. . Encourage patient to continue to monitor CBGs closely particularly while sick and follow up with Endocrinologist for readings outside of established parameters o Reports currently taking: - Toujeo 76 units once daily - Jardiance 25 mg once daily - Ozempic 0.5 mg weekly (reports restarted on Friday) o Note patient previously reported ran out of Ozempic o Reports has noticed CBGs running a little higher since yesterday. Reports CBG 151 this morning o Denies any low blood sugars . Will collaborate with PCP  Patient Self Care Activities:  . Patient to check blood sugars twice daily as directed and keep log . Patient to attend provider appointments as  scheduled   Please see past updates related to this goal by clicking on the "Past Updates" button in the selected goal         Patient verbalizes understanding of instructions provided today.   The care management team will reach out to the patient again over the next 30 days.   [name], PharmD, [place] [employer] [phone]

## 2019-07-20 ENCOUNTER — Ambulatory Visit (INDEPENDENT_AMBULATORY_CARE_PROVIDER_SITE_OTHER): Payer: Medicare Other

## 2019-07-20 DIAGNOSIS — Z0000 Encounter for general adult medical examination without abnormal findings: Secondary | ICD-10-CM | POA: Diagnosis not present

## 2019-07-20 NOTE — Patient Instructions (Signed)
Alyssa Perkins , Thank you for taking time to come for your Medicare Wellness Visit. I appreciate your ongoing commitment to your health goals. Please review the following plan we discussed and let me know if I can assist you in the future.   Screening recommendations/referrals: Colonoscopy: completed 2012, due 2022 Mammogram: due 01/2020 Bone Density: up to date  Recommended yearly ophthalmology/optometry visit for glaucoma screening and checkup Recommended yearly dental visit for hygiene and checkup  Vaccinations: Influenza vaccine: due 11/2019 Pneumococcal vaccine: due now  Tdap vaccine: up to date  Shingles vaccine: shingrix eligible    Covid-19:We are recommending the vaccine to everyone who has not had an allergic reaction to any of the components of the vaccine. If you have specific questions about the vaccine, please bring them up with your health care provider to discuss them.   We will likely not be getting the vaccine in the office for the first rounds of vaccinations. The way they are releasing the vaccines is going to be through the health systems (like [facility], [facility], [facility], [facility]), through your county health department, or through the pharmacies.   The [facility] Department is giving vaccines to those 65+ and Health Care Workers Teachers and [place] providers start 05/12/19, Essential workers start 3/10 and those with co-morbidities start 06/09/19 Call [phone] to schedule  If you are 65+ you can get a vaccine through [facility] by signing up for an appointment.  You can sign up by going to: [email or web address].  You can get more information by going to: [email or web address]  [employer] next door is giving the [employer]- you can call [phone] or stop by there to schedule.  Advanced directives: Please pick up a copy of this information in the office, Once this is complete please bring a copy in to our office so we can scan it  into your chart.   Conditions/risks identified: pain, scheduled follow up with Dr.Karamalegos   Next appointment: Follow up in one year for your annual wellness visit    Preventive Care 65 Years and Older, Female Preventive care refers to lifestyle choices and visits with your health care provider that can promote health and wellness. What does preventive care include?  A yearly physical exam. This is also called an annual well check.  Dental exams once or twice a year.  Routine eye exams. Ask your health care provider how often you should have your eyes checked.  Personal lifestyle choices, including:  Daily care of your teeth and gums.  Regular physical activity.  Eating a healthy diet.  Avoiding tobacco and drug use.  Limiting alcohol use.  Practicing safe sex.  Taking low-dose aspirin every day.  Taking vitamin and mineral supplements as recommended by your health care provider. What happens during an annual well check? The services and screenings done by your health care provider during your annual well check will depend on your age, overall health, lifestyle risk factors, and family history of disease. Counseling  Your health care provider may ask you questions about your:  Alcohol use.  Tobacco use.  Drug use.  Emotional well-being.  Home and relationship well-being.  Sexual activity.  Eating habits.  History of falls.  Memory and ability to understand (cognition).  Work and work Statistician.  Reproductive health. Screening  You may have the following tests or measurements:  Height, weight, and BMI.  Blood pressure.  Lipid and cholesterol levels. These may be checked every 5 years, or more frequently if  you are over 29 years old.  Skin check.  Lung cancer screening. You may have this screening every year starting at age 36 if you have a 30-pack-year history of smoking and currently smoke or have quit within the past 15 years.  Fecal  occult blood test (FOBT) of the stool. You may have this test every year starting at age 82.  Flexible sigmoidoscopy or colonoscopy. You may have a sigmoidoscopy every 5 years or a colonoscopy every 10 years starting at age 36.  Hepatitis C blood test.  Hepatitis B blood test.  Sexually transmitted disease (STD) testing.  Diabetes screening. This is done by checking your blood sugar (glucose) after you have not eaten for a while (fasting). You may have this done every 1-3 years.  Bone density scan. This is done to screen for osteoporosis. You may have this done starting at age 68.  Mammogram. This may be done every 1-2 years. Talk to your health care provider about how often you should have regular mammograms. Talk with your health care provider about your test results, treatment options, and if necessary, the need for more tests. Vaccines  Your health care provider may recommend certain vaccines, such as:  Influenza vaccine. This is recommended every year.  Tetanus, diphtheria, and acellular pertussis (Tdap, Td) vaccine. You may need a Td booster every 10 years.  Zoster vaccine. You may need this after age 72.  Pneumococcal 13-valent conjugate (PCV13) vaccine. One dose is recommended after age 41.  Pneumococcal polysaccharide (PPSV23) vaccine. One dose is recommended after age 77. Talk to your health care provider about which screenings and vaccines you need and how often you need them. This information is not intended to replace advice given to you by your health care provider. Make sure you discuss any questions you have with your health care provider. Document Released: 03/31/2015 Document Revised: 11/22/2015 Document Reviewed: 01/03/2015 Elsevier Interactive Patient Education  2017 [employer] Prevention in the Home Falls can cause injuries. They can happen to people of all ages. There are many things you can do to make your home safe and to help prevent falls. What  can I do on the outside of my home?  Regularly fix the edges of walkways and driveways and fix any cracks.  Remove anything that might make you trip as you walk through a door, such as a raised step or threshold.  Trim any bushes or trees on the path to your home.  Use bright outdoor lighting.  Clear any walking paths of anything that might make someone trip, such as rocks or tools.  Regularly check to see if handrails are loose or broken. Make sure that both sides of any steps have handrails.  Any raised decks and porches should have guardrails on the edges.  Have any leaves, snow, or ice cleared regularly.  Use sand or salt on walking paths during winter.  Clean up any spills in your garage right away. This includes oil or grease spills. What can I do in the bathroom?  Use night lights.  Install grab bars by the toilet and in the tub and shower. Do not use towel bars as grab bars.  Use non-skid mats or decals in the tub or shower.  If you need to sit down in the shower, use a plastic, non-slip stool.  Keep the floor dry. Clean up any water that spills on the floor as soon as it happens.  Remove soap buildup in the tub or  shower regularly.  Attach bath mats securely with double-sided non-slip rug tape.  Do not have throw rugs and other things on the floor that can make you trip. What can I do in the bedroom?  Use night lights.  Make sure that you have a light by your bed that is easy to reach.  Do not use any sheets or blankets that are too big for your bed. They should not hang down onto the floor.  Have a firm chair that has side arms. You can use this for support while you get dressed.  Do not have throw rugs and other things on the floor that can make you trip. What can I do in the kitchen?  Clean up any spills right away.  Avoid walking on wet floors.  Keep items that you use a lot in easy-to-reach places.  If you need to reach something above you, use a  strong step stool that has a grab bar.  Keep electrical cords out of the way.  Do not use floor polish or wax that makes floors slippery. If you must use wax, use non-skid floor wax.  Do not have throw rugs and other things on the floor that can make you trip. What can I do with my stairs?  Do not leave any items on the stairs.  Make sure that there are handrails on both sides of the stairs and use them. Fix handrails that are broken or loose. Make sure that handrails are as long as the stairways.  Check any carpeting to make sure that it is firmly attached to the stairs. Fix any carpet that is loose or worn.  Avoid having throw rugs at the top or bottom of the stairs. If you do have throw rugs, attach them to the floor with carpet tape.  Make sure that you have a light switch at the top of the stairs and the bottom of the stairs. If you do not have them, ask someone to add them for you. What else can I do to help prevent falls?  Wear shoes that:  Do not have high heels.  Have rubber bottoms.  Are comfortable and fit you well.  Are closed at the toe. Do not wear sandals.  If you use a stepladder:  Make sure that it is fully opened. Do not climb a closed stepladder.  Make sure that both sides of the stepladder are locked into place.  Ask someone to hold it for you, if possible.  Clearly mark and make sure that you can see:  Any grab bars or handrails.  First and last steps.  Where the edge of each step is.  Use tools that help you move around (mobility aids) if they are needed. These include:  Canes.  Walkers.  Scooters.  Crutches.  Turn on the lights when you go into a dark area. Replace any light bulbs as soon as they burn out.  Set up your furniture so you have a clear path. Avoid moving your furniture around.  If any of your floors are uneven, fix them.  If there are any pets around you, be aware of where they are.  Review your medicines with your  doctor. Some medicines can make you feel dizzy. This can increase your chance of falling. Ask your doctor what other things that you can do to help prevent falls. This information is not intended to replace advice given to you by your health care provider. Make sure you discuss any  questions you have with your health care provider. Document Released: 12/29/2008 Document Revised: 08/10/2015 Document Reviewed: 04/08/2014 Elsevier Interactive Patient Education  2017 [employer].

## 2019-07-20 NOTE — Progress Notes (Signed)
Subjective:   Alyssa Perkins is a 69 y.o. female who presents for a Subsequent Medicare Annual Wellness Visit.    Review of Systems      Cardiac Risk Factors include: advanced age (>88mn, >>46women);hypertension;dyslipidemia;diabetes mellitus     Objective:    Today's Vitals   07/20/19 1326  PainSc: 8    There is no height or weight on file to calculate BMI.  Advanced Directives 05/15/2019 01/26/2019 04/14/2018 01/21/2018 09/02/2017 02/11/2017 02/03/2017  Does Patient Have a Medical Advance Directive? _0  No No  Would patient like information on creating a medical advance directive? No - Patient declined - - - No - Patient declined Yes (MAU/Ambulatory/Procedural Areas - Information given) No - Patient declined    Current Medications (verified) Outpatient Encounter Medications as of 07/20/2019  Medication Sig  . Blood Glucose Monitoring Suppl (ONETOUCH VERIO) w/Device KIT Use to check blood sugar up to 3 x daily  . cyclobenzaprine (FLEXERIL) 5 MG tablet Take 1-2 tablets (5-10 mg total) by mouth 3 (three) times daily as needed for muscle spasms.  . diclofenac sodium (VOLTAREN) 1 % GEL Apply 2 g topically 3 (three) times daily as needed.  .Regino SchultzeBandages & Supports (MEDICAL COMPRESSION STOCKINGS) MISC 1 each by Does not apply route daily.  . empagliflozin (JARDIANCE) 25 MG TABS tablet Take 25 mg by mouth daily with breakfast.  . famotidine (PEPCID) 20 MG tablet Take 1 tablet (20 mg total) by mouth daily.  .Marland Kitchengabapentin (NEURONTIN) 100 MG capsule Take 2 capsules (200 mg total) by mouth in the morning, at noon, and at bedtime.  . hydrOXYzine (ATARAX/VISTARIL) 25 MG tablet Take 1 tablet (25 mg total) by mouth daily as needed for itching. For itching  . ibuprofen (ADVIL,MOTRIN) 200 MG tablet Take 600-800 mg by mouth every 6 (six) hours as needed for moderate pain.  . Insulin Glargine, 1 Unit Dial, (TOUJEO SOLOSTAR) 300 UNIT/ML SOPN Inject 70 Units into the skin daily.  (Patient taking differently: Inject 76 Units into the skin daily. )  . Insulin Pen Needle 31G X 5 MM MISC 1 each by Does not apply route daily. Use to inject Toujeo daily. Dx:E11.9  . omeprazole (PRILOSEC) 20 MG capsule Take 1 capsule (20 mg total) by mouth daily.  . ondansetron (ZOFRAN-ODT) 4 MG disintegrating tablet Take 1 tablet (4 mg total) by mouth every 8 (eight) hours as needed for nausea or vomiting.  .Glory RosebushDelica Lancets 350IMISC Use to check blood sugar up to 3 x daily  . ONETOUCH VERIO test strip Check blood sugar up to 3 x daily  . quinapril (ACCUPRIL) 20 MG tablet Take 1 tablet (20 mg total) by mouth at bedtime.  . Semaglutide,0.25 or 0.5MG/DOS, (OZEMPIC, 0.25 OR 0.5 MG/DOSE,) 2 MG/1.5ML SOPN Inject 0.5 mg into the skin once a week.  . sertraline (ZOLOFT) 50 MG tablet Take 1 tablet (50 mg total) by mouth daily.  . simvastatin (ZOCOR) 40 MG tablet Take 1 tablet (40 mg total) by mouth at bedtime. Reported on 05/16/2015  . traMADol (ULTRAM) 50 MG tablet Take 1 tablet (50 mg total) by mouth every 6 (six) hours as needed.  . traZODone (DESYREL) 50 MG tablet Take 1 tablet (50 mg total) by mouth at bedtime.  . [DISCONTINUED] ranitidine (ZANTAC) 300 MG tablet Take 1 tablet (300 mg total) by mouth daily.   No facility-administered encounter medications on file as of 07/20/2019.    Allergies (verified) Patient has no  known allergies.   History: Past Medical History:  Diagnosis Date  . Anginal pain ([place])   . Anxiety    not on any medication at this time  . Arthritis   . Asthma    "sports asthma or exercised induced"seen at [facility] for primary  . Chronic back pain   . Chronic neck pain   . Complication of anesthesia    "difficulty waking up from surgery"  . Depression    has OCD, states counts constantly  . GERD (gastroesophageal reflux disease)   . Headache(784.0)    hx of migraines  . Hyperlipidemia   . IBS (irritable bowel syndrome)   . PONV  (postoperative nausea and vomiting)   . RLS (restless legs syndrome)   . Sleep apnea   . Thyroid disease    Past Surgical History:  Procedure Laterality Date  . ABDOMINAL HYSTERECTOMY     also had oophorectomy  . AMPUTATION TOE Right 09/05/2017   Procedure: AMPUTATION TOE-RIGHT 2ND TOE;  Surgeon: Sharlotte Alamo, DPM;  Location: [facility];  Service: Podiatry;  Laterality: Right;  . ANTERIOR CERVICAL DECOMP/DISCECTOMY FUSION  01/02/2012   Procedure: ANTERIOR CERVICAL DECOMPRESSION/DISCECTOMY FUSION 2 LEVELS;  Surgeon: Melina Schools, MD;  Location: [facility] [place];  Service: Orthopedics;  Laterality: N/A;  ACDF C5-7  . BILATERAL CARPAL TUNNEL RELEASE    . CARDIAC CATHETERIZATION     done approx. 4- 5 years ago, Dr. Leanor Kail, does not see cardi. at this time.  . CHOLECYSTECTOMY    . DILATION AND CURETTAGE OF UTERUS     "several"  . HERNIA REPAIR    . IRRIGATION AND DEBRIDEMENT FOOT  09/05/2017   Procedure: IRRIGATION AND DEBRIDEMENT FOOT;  Surgeon: Sharlotte Alamo, DPM;  Location: [facility];  Service: Podiatry;;  . KNEE SURGERY     1 partial left, 1 total left  . SPINE SURGERY     Family History  Problem Relation Age of Onset  . Heart disease Mother   . Osteoporosis Mother   . Breast cancer Mother        dx with ovarian ca 1st  . Ovarian cancer Mother        62's  . Osteoporosis Sister   . Diabetes Sister   . Breast cancer Sister        early 71's no braca testing   . Cancer Brother        sinus, bone  . Osteoporosis Brother    Social History   Socioeconomic History  . Marital status: Widowed    Spouse name: Not on file  . Number of children: Not on file  . Years of education: 43  . Highest education level: 12th grade  Occupational History  . Not on file  Tobacco Use  . Smoking status: Former Smoker    Packs/day: 0.50    Years: 15.00    Pack years: 7.50    Types: Cigarettes    Quit date: 07/17/1994    Years since quitting: 25.0  . Smokeless tobacco: Former Systems developer  . Tobacco  comment: stopped 18 years ago  Substance and Sexual Activity  . Alcohol use: No  . Drug use: No  . Sexual activity: Not on file  Other Topics Concern  . Not on file  Social History Narrative  . Not on file   Social Determinants of Health   Financial Resource Strain: Low Risk   . Difficulty of Paying Living Expenses: Not hard at all  Food Insecurity: No  Food Insecurity  . Worried About Charity fundraiser in the Last Year: Never true  . Ran Out of Food in the Last Year: Never true  Transportation Needs: No Transportation Needs  . Lack of Transportation (Medical): No  . Lack of Transportation (Non-Medical): No  Physical Activity: Inactive  . Days of Exercise per Week: 0 days  . Minutes of Exercise per Session: 0 min  Stress: No Stress Concern Present  . Feeling of Stress : Only a little  Social Connections:   . Frequency of Communication with Friends and Family:   . Frequency of Social Gatherings with Friends and Family:   . Attends Religious Services:   . Active Member of Clubs or Organizations:   . Attends Archivist Meetings:   [name] Marital Status:     Tobacco Counseling Counseling given: Not Answered Comment: stopped 18 years ago   Clinical Intake:  Pre-visit preparation completed: Yes  Pain : 0-10 Pain Score: 8  Pain Type: Chronic pain Pain Location: Back Pain Descriptors / Indicators: Aching Pain Onset: More than a month ago Pain Frequency: Constant     Nutritional Risks: None Diabetes: No  How often do you need to have someone help you when you read instructions, pamphlets, or other written materials from your doctor or pharmacy?: 1 - Never  Interpreter Needed?: No  Information entered by :: Timea Breed,LPN   Activities of Daily Living In your present state of health, do you have any difficulty performing the following activities: 07/20/2019 05/10/2019  Hearing? N Y  Comment no hearing aids -  Vision? N Y  Comment reading glasses,  Dr.Woodard -  Difficulty concentrating or making decisions? Tempie Donning  Walking or climbing stairs? N Y  Comment - loosing gaits  Dressing or bathing? N N  Doing errands, shopping? N N  Preparing Food and eating ? N -  Using the Toilet? N -  In the past six months, have you accidently leaked urine? N -  Do you have problems with loss of bowel control? N -  Managing your Medications? N -  Managing your Finances? N -  Housekeeping or managing your Housekeeping? N -  Some recent data might be hidden     Immunizations and Health Maintenance Immunization History  Administered Date(s) Administered  . Influenza, High Dose Seasonal PF 01/15/2017   Health Maintenance Due  Topic Date Due  . Hepatitis C Screening  Never done  . COVID-19 Vaccine (1) Never done  . PNA vac Low Risk Adult (1 of 2 - PCV13) Never done    Patient Care Team: [name], DO as PCP - General (Family Medicine) [place], Virl Diamond, [place] as Pharmacist Vanita Ingles, RN as Case Manager (General Practice)  Indicate any recent Medical Services you may have received from other than [facility] providers in the past year (date may be approximate).     Assessment:   This is a routine wellness examination for [place].  Hearing/Vision screen No exam data present  Dietary issues and exercise activities discussed: Current Exercise Habits: Home exercise routine, Type of exercise: walking, Frequency (Times/Week): 2, Intensity: Mild, Exercise limited by: None identified  Goals Addressed   None    Depression Screen PHQ 2/9 Scores 07/20/2019 05/10/2019 03/31/2019 10/21/2018 04/09/2018 11/13/2017 10/15/2017  PHQ - 2 Score _0 - 3  PHQ- 9 Score - _1 - 13  Exception Documentation - - - - - Patient refusal -  Fall Risk Fall Risk  07/20/2019 05/10/2019 10/19/2018 04/09/2018 02/04/2018  Falls in the past year? 0 0 _0 Comment - - - - Emmi Telephone Survey: data to providers prior to load  Number falls in past  yr: 0 0 1 - 1  Comment - - - - Emmi Telephone Survey Actual Response = 45  Injury with Fall? 0 0 1 - 1  Comment - - hit head - -  Risk Factor Category  - - - - -  Risk for fall due to : - - History of fall(s);Other (Comment) - -  Risk for fall due to: Comment - - severe neuropathy - -  Follow up - Falls evaluation completed Falls prevention discussed;Follow up appointment Falls evaluation completed -  Comment - - plans to see pcp - -   FALL RISK PREVENTION PERTAINING TO THE HOME:  Any stairs in or around the home? No  If so, are there any without handrails? No   Home free of loose throw rugs in walkways, pet beds, electrical cords, etc? Yes  Adequate lighting in your home to reduce risk of falls? Yes   ASSISTIVE DEVICES UTILIZED TO PREVENT FALLS:  Life alert? No  Use of a cane, walker or w/c? No  Grab bars in the bathroom? No  Shower chair or bench in shower? No  Elevated toilet seat or a handicapped toilet? No    DME ORDERS:  DME order needed?  No   TIMED UP AND GO:  Unable to perform    Cognitive Function:     6CIT Screen 02/11/2017  What Year? 0 points  What month? 0 points  What time? 0 points  Count back from 20 0 points  Months in reverse 0 points  Repeat phrase 0 points  Total Score 0    Screening Tests Health Maintenance  Topic Date Due  . Hepatitis C Screening  Never done  . COVID-19 Vaccine (1) Never done  . PNA vac Low Risk Adult (1 of 2 - PCV13) Never done  . INFLUENZA VACCINE  10/17/2019  . FOOT EXAM  10/21/2019  . HEMOGLOBIN A1C  11/07/2019  . COLONOSCOPY  03/18/2020  . OPHTHALMOLOGY EXAM  04/11/2020  . MAMMOGRAM  01/20/2021  . TETANUS/TDAP  03/18/2022  . DEXA SCAN  Completed    Qualifies for Shingles Vaccine? Yes  Zostavax completed n/a. Due for Shingrix. Education has been provided regarding the importance of this vaccine. Pt has been advised to call insurance company to determine out of pocket expense. Advised may also receive vaccine  at local pharmacy or Health Dept. Verbalized acceptance and understanding.  Tdap: up to date   Flu Vaccine:   Pneumococcal Vaccine: due now, will get at next visit.   Covid-19 Vaccine: Information provided   Cancer Screenings:  Colorectal Screening: Completed 03/18/2010  Mammogram: Completed 01/21/2019. Repeat every year  Bone Density: Completed 03/2012.  Lung Cancer Screening: (Low Dose CT Chest recommended if Age 30-80 years, 30 pack-year currently smoking OR have quit w/in 15years.) does not qualify.   Additional Screening:  Hepatitis C Screening: does qualify, needs done with next lab appt   Vision Screening: Recommended annual ophthalmology exams for early detection of glaucoma and other disorders of the eye. Is the patient up to date with their annual eye exam?  Yes  Who is the provider or what is the name of the office in which the pt attends annual eye exams? Dr.Woodard   Dental Screening: Recommended annual  dental exams for proper oral hygiene  Community Resource Referral:  CRR required this visit?  No       Plan:  I have personally reviewed and addressed the Medicare Annual Wellness questionnaire and have noted the following in the patient's chart:  A. Medical and social history B. Use of alcohol, tobacco or illicit drugs  C. Current medications and supplements D. Functional ability and status E.  Nutritional status F.  Physical activity G. Advance directives H. List of other physicians I.  Hospitalizations, surgeries, and ER visits in previous 12 months J.  [place] such as hearing and vision if needed, cognitive and depression L. Referrals and appointments   In addition, I have reviewed and discussed with patient certain preventive protocols, quality metrics, and best practice recommendations. A written personalized care plan for preventive services as well as general preventive health recommendations were provided to patient.   Signed,     Bevelyn Ngo, LPN   07/17/1745  Nurse Health Advisor   Nurse Notes:  Patient complaining of increase in pain in abdomen radiating to back 8/10 pain. States it feels like sharp needles. Denies any rashes. Scheduled appt for 07/26/2019 in office for follow up.

## 2019-07-26 ENCOUNTER — Ambulatory Visit: Payer: Medicare Other | Admitting: Family Medicine

## 2019-08-09 ENCOUNTER — Ambulatory Visit (INDEPENDENT_AMBULATORY_CARE_PROVIDER_SITE_OTHER): Payer: Medicare Other | Admitting: Pharmacist

## 2019-08-09 DIAGNOSIS — E1169 Type 2 diabetes mellitus with other specified complication: Secondary | ICD-10-CM | POA: Diagnosis not present

## 2019-08-09 DIAGNOSIS — E785 Hyperlipidemia, unspecified: Secondary | ICD-10-CM

## 2019-08-09 DIAGNOSIS — Z794 Long term (current) use of insulin: Secondary | ICD-10-CM | POA: Diagnosis not present

## 2019-08-09 DIAGNOSIS — I10 Essential (primary) hypertension: Secondary | ICD-10-CM | POA: Diagnosis not present

## 2019-08-09 DIAGNOSIS — E1140 Type 2 diabetes mellitus with diabetic neuropathy, unspecified: Secondary | ICD-10-CM | POA: Diagnosis not present

## 2019-08-09 NOTE — Chronic Care Management (AMB) (Signed)
Chronic Care Management   Follow Up Note   08/09/2019 Name: Alyssa Perkins MRN: [id number] DOB: 20-Oct-1950  Referred by: Alyssa Hauser, DO Reason for referral : Chronic Care Management (Patient Phone Call)   Alyssa Perkins is a 69 y.o. year old female who is a primary care patient of Alyssa Hauser, DO. The CCM team was consulted for assistance with chronic disease management and care coordination needs.  Alyssa Perkins has a past medical history including but not limited to type 2 diabetes mellitus, hypertension, OSA, depression, hyperlipidemia, chronic back pain and acid reflux.    I reached out to Alyssa Perkins by phone today.   Review of patient status, including review of consultants reports, relevant laboratory and other test results, and collaboration with appropriate care team members and the patient's provider was performed as part of comprehensive patient evaluation and provision of chronic care management services.     Outpatient Encounter Medications as of 08/09/2019  Medication Sig  . empagliflozin (JARDIANCE) 25 MG TABS tablet Take 25 mg by mouth daily with breakfast.  . Insulin Glargine, 1 Unit Dial, (TOUJEO SOLOSTAR) 300 UNIT/ML SOPN Inject 70 Units into the skin daily. (Patient taking differently: Inject 76 Units into the skin daily. )  . Semaglutide,0.25 or 0.'5MG'$ /DOS, (OZEMPIC, 0.25 OR 0.5 MG/DOSE,) 2 MG/1.5ML SOPN Inject 0.5 mg into the skin once a week.  . simvastatin (ZOCOR) 40 MG tablet Take 1 tablet (40 mg total) by mouth at bedtime. Reported on 05/16/2015  . Blood Glucose Monitoring Suppl (ONETOUCH VERIO) w/Device KIT Use to check blood sugar up to 3 x daily  . cyclobenzaprine (FLEXERIL) 5 MG tablet Take 1-2 tablets (5-10 mg total) by mouth 3 (three) times daily as needed for muscle spasms.  . diclofenac sodium (VOLTAREN) 1 % GEL Apply 2 g topically 3 (three) times daily as needed.  Alyssa Perkins Bandages & Supports (MEDICAL COMPRESSION  STOCKINGS) MISC 1 each by Does not apply route daily.  . famotidine (PEPCID) 20 MG tablet Take 1 tablet (20 mg total) by mouth daily.  Alyssa Perkins gabapentin (NEURONTIN) 100 MG capsule Take 2 capsules (200 mg total) by mouth in the morning, at noon, and at bedtime.  . hydrOXYzine (ATARAX/VISTARIL) 25 MG tablet Take 1 tablet (25 mg total) by mouth daily as needed for itching. For itching  . ibuprofen (ADVIL,MOTRIN) 200 MG tablet Take 600-800 mg by mouth every 6 (six) hours as needed for moderate pain.  . Insulin Pen Needle 31G X 5 MM MISC 1 each by Does not apply route daily. Use to inject Toujeo daily. Dx:E11.9  . omeprazole (PRILOSEC) 20 MG capsule Take 1 capsule (20 mg total) by mouth daily.  . ondansetron (ZOFRAN-ODT) 4 MG disintegrating tablet Take 1 tablet (4 mg total) by mouth every 8 (eight) hours as needed for nausea or vomiting.  Alyssa Perkins Delica Lancets 50D MISC Use to check blood sugar up to 3 x daily  . ONETOUCH VERIO test strip Check blood sugar up to 3 x daily  . quinapril (ACCUPRIL) 20 MG tablet Take 1 tablet (20 mg total) by mouth at bedtime.  . sertraline (ZOLOFT) 50 MG tablet Take 1 tablet (50 mg total) by mouth daily.  . traMADol (ULTRAM) 50 MG tablet Take 1 tablet (50 mg total) by mouth every 6 (six) hours as needed.  . traZODone (DESYREL) 50 MG tablet Take 1 tablet (50 mg total) by mouth at bedtime.  . [DISCONTINUED] ranitidine (ZANTAC) 300 MG tablet Take 1 tablet (  300 mg total) by mouth daily.   No facility-administered encounter medications on file as of 08/09/2019.    Goals Addressed            This Visit's Progress   . Perkins- Medication Adherence (pt-stated)       Current Barriers:  Alyssa Perkins Knowledge deficits related to impact of diet on blood sugar and medications to manage blood sugar . Non Adherence to prescribed medication regimen . Financial Barriers . Lack of blood sugar results for clinical team  Pharmacist Clinical Goal(s):  Alyssa Perkins Over the next 30 days, patient will  work with CM Pharmacist to address needs related to obtaining a blood glucose meter, medication adherence and medication regimen optimization  Interventions: . Perform chart review . Follow up with patient o Reports feeling better since we last spoke.  o From chart see that patient missed appointment with PCP to address abdominal pain on 5/10. Patient reports she did not come to the office as she understood this to be a telephone appointment o Confirms planning to attend appointment with  GI on 6/15 . Counsel on importance of blood sugar control and monitoring o Reports currently taking: - Toujeo 76 units once daily - Jardiance 25 mg once daily - Ozempic 0.5 mg weekly (on Fridays) o Reports fasting morning CBGs running ~90s-180s o Counsel on s/s of low blood sugars and encourage to check CBG if ever feel might be low . Encourage patient to restart monitoring home blood pressure and keep log of results o Reports currently unsure of where BP monitor as she is currently in the process of moving in with her sister . Discuss importance of medication adherence. Patient confirms will call pharmacy to reorder refills of medications that are due . Coordination of care: Encourage patient to schedule appointment with Endocrinology, as patient due for 38-monthfollow up. . Schedule appointment with patient to review medications and blood pressure readings  Patient Self Care Activities:  . Patient to check blood sugars twice daily as directed and keep log . Patient to attend provider appointments as scheduled   Please see past updates related to this goal by clicking on the "Past Updates" button in the selected goal         Plan  Telephone follow up appointment with care management team member scheduled for: 7/7 at 2 pm   EHarlow Alyssa Perkins, [phone]

## 2019-08-09 NOTE — Patient Instructions (Signed)
Thank you allowing the Chronic Care Management Team to be a part of your care! It was a pleasure speaking with you today!     CCM (Chronic Care Management) Team    Noreene Larsson RN, MSN, CCM Nurse Care Coordinator  [phone]   [name] PharmD  Clinical Pharmacist  [phone]   Eula Fried LCSW Clinical Social Worker [phone]  Visit Information  Goals Addressed            This Visit's Progress   . PharmD- Medication Adherence (pt-stated)       Current Barriers:  [name] Knowledge deficits related to impact of diet on blood sugar and medications to manage blood sugar . Non Adherence to prescribed medication regimen . Financial Barriers . Lack of blood sugar results for clinical team  Pharmacist Clinical Goal(s):  [name] Over the next 30 days, patient will work with CM Pharmacist to address needs related to obtaining a blood glucose meter, medication adherence and medication regimen optimization  Interventions: . Perform chart review . Follow up with patient o Reports feeling better since we last spoke.  o From chart see that patient missed appointment with PCP to address abdominal pain on 5/10. Patient reports she did not come to the office as she understood this to be a telephone appointment o Confirms planning to attend appointment with [facility] on 6/15 . Counsel on importance of blood sugar control and monitoring o Reports currently taking: - Toujeo 76 units once daily - Jardiance 25 mg once daily - Ozempic 0.5 mg weekly (on Fridays) o Reports fasting morning CBGs running ~90s-180s o Counsel on s/s of low blood sugars and encourage to check CBG if ever feel might be low . Encourage patient to restart monitoring home blood pressure and keep log of results o Reports currently unsure of where BP monitor as she is currently in the process of moving in with her sister . Discuss importance of medication adherence. Patient confirms will call pharmacy to reorder  refills of medications that are due . Coordination of care: Encourage patient to schedule appointment with Endocrinology, as patient due for 96-month follow up. . Schedule appointment with patient to review medications and blood pressure readings  Patient Self Care Activities:  . Patient to check blood sugars twice daily as directed and keep log . Patient to attend provider appointments as scheduled   Please see past updates related to this goal by clicking on the "Past Updates" button in the selected goal         Patient verbalizes understanding of instructions provided today.   Telephone follow up appointment with care management team member scheduled for: 7/7 at 2 pm   [name], PharmD, [place] [phone]

## 2019-08-26 ENCOUNTER — Ambulatory Visit (INDEPENDENT_AMBULATORY_CARE_PROVIDER_SITE_OTHER): Payer: Medicare Other | Admitting: Family Medicine

## 2019-08-26 ENCOUNTER — Encounter: Payer: Self-pay | Admitting: Family Medicine

## 2019-08-26 ENCOUNTER — Other Ambulatory Visit: Payer: Self-pay

## 2019-08-26 VITALS — BP 130/67 | HR 79 | Temp 97.1°F | Ht 63.0 in | Wt 206.0 lb

## 2019-08-26 DIAGNOSIS — L02411 Cutaneous abscess of right axilla: Secondary | ICD-10-CM

## 2019-08-26 MED ORDER — SULFAMETHOXAZOLE-TRIMETHOPRIM 800-160 MG PO TABS: 1.0000 | ORAL_TABLET | Freq: Two times a day (BID) | ORAL | 0 refills | Status: DC

## 2019-08-26 NOTE — Assessment & Plan Note (Signed)
Right axilla abscess, approx 2 inches at widest point, no area of fluctuance or prominent punctum.  Discussed with patient would be better treated with antibiotics instead of incision and drainage.  Patient in agreement with treatment plan.  Plan: 1. Begin Bactrim DS 1 tablet 2x per day for the next 5 days 2. If worsening of symptoms, can return to clinic or urgent care for evaluation

## 2019-08-26 NOTE — Progress Notes (Signed)
Subjective:    Patient ID: Alyssa Perkins, female    DOB: 03-25-1950, 69 y.o.   MRN: [id number]  Alyssa Perkins is a 69 y.o. female presenting on 08/26/2019 for Recurrent Skin Infections (under Rt armpit x 3 days. Pt state that it burns, red and inflamed.)   HPI  Alyssa Perkins presents to clinic for evaluation of right armpit swelling and pain x 3 days.  Reports symptoms as burning, redness, swelling and pain with palpation and sleeping on her right side.  Denies injury, trauma, bug bite, or puncture to the skin.  Denies fevers, chills, shakes, abdominal pain, n/v/d, streaking from right armpit, numbness, tingling or weakness.  Depression screen [facility] 2/9 07/20/2019 05/10/2019 03/31/2019  Decreased Interest 0 3 3  Down, Depressed, Hopeless 1 1 1   PHQ - 2 Score 1 4 4   Altered sleeping - 3 3  Tired, decreased energy - 3 3  Change in appetite - 1 1  Feeling bad or failure about yourself  - 0 0  Trouble concentrating - 3 3  Moving slowly or fidgety/restless - 0 0  Suicidal thoughts - 0 0  PHQ-9 Score - 14 14  Difficult doing work/chores - Somewhat difficult Somewhat difficult  Some recent data might be hidden    Social History   Tobacco Use  . Smoking status: Former Smoker    Packs/day: 0.50    Years: 15.00    Pack years: 7.50    Types: Cigarettes    Quit date: 07/17/1994    Years since quitting: 25.1  . Smokeless tobacco: Former Systems developer  . Tobacco comment: stopped 18 years ago  Vaping Use  . Vaping Use: Never used  Substance Use Topics  . Alcohol use: No  . Drug use: No    Review of Systems  Constitutional: Negative.   HENT: Negative.   Eyes: Negative.   Respiratory: Negative.   Cardiovascular: Negative.   Gastrointestinal: Negative.   Endocrine: Negative.   Genitourinary: Negative.   Musculoskeletal: Negative.   Skin: Positive for color change and rash. Negative for pallor.       Right axilla  Allergic/Immunologic: Negative.   Neurological: Negative.     Hematological: Negative.   Psychiatric/Behavioral: Negative.    Per HPI unless specifically indicated above     Objective:    BP 130/67 (BP Location: Left Arm, Cuff Size: Normal)   Pulse 79   Temp (!) 97.1 F (36.2 C) (Temporal)   Ht 5\' 3"  (1.6 m)   Wt 206 lb (93.4 kg)   BMI 36.49 kg/m   Wt Readings from Last 3 Encounters:  08/26/19 206 lb (93.4 kg)  05/15/19 201 lb (91.2 kg)  05/10/19 205 lb (93 kg)    Physical Exam Vitals reviewed.  Constitutional:      General: She is not in acute distress.    Appearance: Normal appearance. She is well-developed and well-groomed. She is obese. She is not ill-appearing or toxic-appearing.  HENT:     Head: Normocephalic and atraumatic.     Nose:     Comments: Lizbeth Bark is in place, covering mouth and nose Eyes:     General:        Right eye: No discharge.        Left eye: No discharge.     Extraocular Movements: Extraocular movements intact.     Conjunctiva/sclera: Conjunctivae normal.     Pupils: Pupils are equal, round, and reactive to light.  Cardiovascular:     Pulses: Normal  pulses.  Pulmonary:     Effort: Pulmonary effort is normal. No respiratory distress.  Skin:    General: Skin is warm and dry.     Capillary Refill: Capillary refill takes less than 2 seconds.     Findings: Abscess present.     Comments: Right axilla abscess, approx 2 inches around borders with deep palpation, no area of fluctuance or prominent punctum.  No streaking.  Neurological:     General: No focal deficit present.     Mental Status: She is alert and oriented to person, place, and time.     Cranial Nerves: No cranial nerve deficit.  Psychiatric:        Mood and Affect: Mood normal.        Behavior: Behavior normal. Behavior is cooperative.        Thought Content: Thought content normal.        Judgment: Judgment normal.    Results for orders placed or performed in visit on 05/10/19  POCT HgB A1C  Result Value Ref Range   Hemoglobin A1C 7.5  (A) 4.0 - 5.6 %      Assessment & Plan:   Problem List Items Addressed This Visit      Musculoskeletal and Integument   Cutaneous abscess of right axilla - Primary    Right axilla abscess, approx 2 inches at widest point, no area of fluctuance or prominent punctum.  Discussed with patient would be better treated with antibiotics instead of incision and drainage.  Patient in agreement with treatment plan.  Plan: 1. Begin Bactrim DS 1 tablet 2x per day for the next 5 days 2. If worsening of symptoms, can return to clinic or urgent care for evaluation      Relevant Medications   sulfamethoxazole-trimethoprim (BACTRIM DS) 800-160 MG tablet      Meds ordered this encounter  Medications  . sulfamethoxazole-trimethoprim (BACTRIM DS) 800-160 MG tablet    Sig: Take 1 tablet by mouth 2 (two) times daily for 5 days.    Dispense:  10 tablet    Refill:  0      Follow up plan: Return if symptoms worsen or fail to improve.   Harlin Rain, [place] [facility] Nurse Practitioner [facility] 08/26/2019, 3:27 PM

## 2019-08-26 NOTE — Patient Instructions (Signed)
I have sent in a prescription for Bactrim DS to take 1 tablet twice a day for the next 5 days.  If you find that you are having worsening of symptoms, to return to clinic and we can see if the abscess has progressed to where it can be opened and drained.  If this happen over the weekend, the local urgent cares are open and please proceed there for assistance.  We will plan to see you back as needed for this  You will receive a survey after today's visit either digitally by e-mail or paper by [place] mail. Your experiences and feedback matter to [place].  Please respond so we know how we are doing as we provide care for you.  Call us with any questions/concerns/needs.  It is my goal to be available to you for your health concerns.  Thanks for choosing me to be a partner in your healthcare needs!  Harlin Rain, FNP-C Family Nurse Practitioner [facility] Phone: [phone]

## 2019-08-30 ENCOUNTER — Other Ambulatory Visit: Payer: Self-pay | Admitting: Family Medicine

## 2019-08-30 ENCOUNTER — Ambulatory Visit (INDEPENDENT_AMBULATORY_CARE_PROVIDER_SITE_OTHER): Payer: Medicare Other | Admitting: General Practice

## 2019-08-30 ENCOUNTER — Telehealth: Payer: Medicare Other | Admitting: General Practice

## 2019-08-30 DIAGNOSIS — I10 Essential (primary) hypertension: Secondary | ICD-10-CM

## 2019-08-30 DIAGNOSIS — E1169 Type 2 diabetes mellitus with other specified complication: Secondary | ICD-10-CM

## 2019-08-30 DIAGNOSIS — R197 Diarrhea, unspecified: Secondary | ICD-10-CM

## 2019-08-30 DIAGNOSIS — E785 Hyperlipidemia, unspecified: Secondary | ICD-10-CM

## 2019-08-30 DIAGNOSIS — E1140 Type 2 diabetes mellitus with diabetic neuropathy, unspecified: Secondary | ICD-10-CM

## 2019-08-30 DIAGNOSIS — R630 Anorexia: Secondary | ICD-10-CM

## 2019-08-30 DIAGNOSIS — Z794 Long term (current) use of insulin: Secondary | ICD-10-CM

## 2019-08-30 DIAGNOSIS — L02411 Cutaneous abscess of right axilla: Secondary | ICD-10-CM

## 2019-08-30 DIAGNOSIS — IMO0002 Reserved for concepts with insufficient information to code with codable children: Secondary | ICD-10-CM

## 2019-08-30 DIAGNOSIS — G8929 Other chronic pain: Secondary | ICD-10-CM

## 2019-08-30 MED ORDER — ONETOUCH VERIO W/DEVICE KIT: PACK | 0 refills | Status: AC

## 2019-08-30 NOTE — Chronic Care Management (AMB) (Signed)
Chronic Care Management   Follow Up Note   08/30/2019 Name: [name] MRN: [id number] DOB: [birth year]  Referred by: [name], DO Reason for referral : Chronic Care Management (RNCM Chronic Disease Management and care coordination needs)   Alyssa Perkins is a 69 y.o. year old female who is a primary care patient of [name], DO. The CCM team was consulted for assistance with chronic disease management and care coordination needs.    Review of patient status, including review of consultants reports, relevant laboratory and other test results, and collaboration with appropriate care team members and the patient's provider was performed as part of comprehensive patient evaluation and provision of chronic care management services.    SDOH (Social Determinants of Health) assessments performed: No See Care Plan activities for detailed interventions related to [facility])     Outpatient Encounter Medications as of 08/30/2019  Medication Sig  . Blood Glucose Monitoring Suppl (ONETOUCH VERIO) w/Device KIT Use to check blood sugar up to 3 x daily  . cyclobenzaprine (FLEXERIL) 5 MG tablet Take 1-2 tablets (5-10 mg total) by mouth 3 (three) times daily as needed for muscle spasms. (Patient not taking: Reported on 08/26/2019)  . diclofenac sodium (VOLTAREN) 1 % GEL Apply 2 g topically 3 (three) times daily as needed.  Regino Schultze Bandages & Supports (MEDICAL COMPRESSION STOCKINGS) MISC 1 each by Does not apply route daily.  . empagliflozin (JARDIANCE) 25 MG TABS tablet Take 25 mg by mouth daily with breakfast.  . famotidine (PEPCID) 20 MG tablet Take 1 tablet (20 mg total) by mouth daily.  [name] gabapentin (NEURONTIN) 100 MG capsule Take 2 capsules (200 mg total) by mouth in the morning, at noon, and at bedtime.  . hydrOXYzine (ATARAX/VISTARIL) 25 MG tablet Take 1 tablet (25 mg total) by mouth daily as needed for itching. For itching (Patient taking differently: Take 25  mg by mouth at bedtime. For itching)  . ibuprofen (ADVIL,MOTRIN) 200 MG tablet Take 600-800 mg by mouth every 6 (six) hours as needed for moderate pain. (Patient not taking: Reported on 08/26/2019)  . Insulin Glargine, 1 Unit Dial, (TOUJEO SOLOSTAR) 300 UNIT/ML SOPN Inject 70 Units into the skin daily. (Patient taking differently: Inject 76 Units into the skin daily. )  . Insulin Pen Needle 31G X 5 MM MISC 1 each by Does not apply route daily. Use to inject Toujeo daily. Dx:E11.9  . omeprazole (PRILOSEC) 20 MG capsule Take 1 capsule (20 mg total) by mouth daily.  . ondansetron (ZOFRAN-ODT) 4 MG disintegrating tablet Take 1 tablet (4 mg total) by mouth every 8 (eight) hours as needed for nausea or vomiting.  [name] Lancets 66Q MISC Use to check blood sugar up to 3 x daily  . ONETOUCH VERIO test strip Check blood sugar up to 3 x daily  . quinapril (ACCUPRIL) 20 MG tablet Take 1 tablet (20 mg total) by mouth at bedtime.  . Semaglutide,0.25 or 0.'5MG'$ /DOS, (OZEMPIC, 0.25 OR 0.5 MG/DOSE,) 2 MG/1.5ML SOPN Inject 0.5 mg into the skin once a week.  . sertraline (ZOLOFT) 50 MG tablet Take 1 tablet (50 mg total) by mouth daily.  . simvastatin (ZOCOR) 40 MG tablet Take 1 tablet (40 mg total) by mouth at bedtime. Reported on 05/16/2015  . sulfamethoxazole-trimethoprim (BACTRIM DS) 800-160 MG tablet Take 1 tablet by mouth 2 (two) times daily for 5 days.  . traMADol (ULTRAM) 50 MG tablet Take 1 tablet (50 mg total) by mouth every 6 (six)  hours as needed.  . traZODone (DESYREL) 50 MG tablet Take 1 tablet (50 mg total) by mouth at bedtime.  . [DISCONTINUED] ranitidine (ZANTAC) 300 MG tablet Take 1 tablet (300 mg total) by mouth daily.   No facility-administered encounter medications on file as of 08/30/2019.     Objective:  BP Readings from Last 3 Encounters:  08/26/19 130/67  05/15/19 109/63  05/10/19 (!) 95/58    Goals Addressed              This Visit's Progress   .  RNCM- I need help with  my diabetes (pt-stated)        Current Barriers:  [name] Knowledge Deficits related to basic Diabetes pathophysiology and self care/management . Knowledge Deficits related to medications used for management of diabetes . Knowledge Deficits related to adherence of a heart healthy/ADA diet . 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 daily monitoring and recording of CBG  . adherence to ADA/ carb modified diet . adherence to prescribed medication regimen  Interventions:  . Provided education to patient about basic DM disease process . Reviewed medications with patient and discussed importance of medication adherence . Discussed plans with patient for ongoing care management follow up and provided patient with direct contact information for care management team . Reviewed scheduled/upcoming provider appointments including: need to follow up with pcp, appointment to see podiatrist, endocrinology appointment  . Discussed the need to follow a Heart healthy/ADA diet and review of sugar substitutes  . Patient states she only takes her blood sugars 1 time a day and it was 120,  4 or 5 days ago.  The patient states her meter is missing and so is her toujeo pen is missing also. Her adult son and grandson live with her and the sons 37.42 year old daughter has been there and she feels like she may have gotten it and put it somewhere. This meter is over 41 years old. Will collaborate with the pcp and see if she can have an order sent to her pharmacy for a new meter that her insurance will pay for. Education given on the importance of keeping devices and medications out of the reach of children. The patient states that the granddaughter is not there all the time and she was not  thinking about her messing with her items.  The patient states she hasn't worked herself up to BID yet. Education on the importance of checking regularly to maintain health and well being . Evaluation of foot exams. The patient does not check daily, states she does need to see the podiatrist, advised to check feet daily for sores and cuts . Stated she still needed to go to the eye doctor  Patient Self Care Activities:  . UNABLE to independently manage diabetes as evidenced by a recent A1C of 14% on 10/21/18.  Reading on 05-10-2019 was 7.5.   Please see past updates related to this goal by clicking on the "Past Updates" button in the selected goal       .  RNCM: "I don't check my blood pressure" (pt-stated)        [place] (see longtitudinal plan of care for additional care plan information)  Current Barriers:  . Chronic Disease Management support, education, and care coordination needs related to  HTN, HLD, GI problems, and Chronic pain  Clinical Goal(s) related to HTN, HLD, GI problems, and Chronic pain:  Over the next 120 days, patient will:  . Work with the care management team to address educational, disease management, and care coordination needs  . Begin or continue self health monitoring activities as directed today Measure and record blood pressure 2 times per week and adhere to heart healthy/ADA diet . Call provider office for new or worsened signs and symptoms Blood pressure findings outside established parameters, Shortness of breath, and New or worsened symptom related to HLD, Chronic pain and other Chronic health conditions . Call care management team with questions or concerns . Verbalize basic understanding of patient centered plan of care established today  Interventions related to HTN, HLD, GI problems and Chronic pain:  . Evaluation of current treatment plans and patient's adherence to plan as established by provider.  The patient verbalized that the last several days  she has not felt well at all. Was started on the 10th on ABX for cutaneous Abscess at her arm pit. The patient took 3 doses and stopped because she broke out in hives.  She did not report to the provider and she stopped taking the ABX.  The patient used peroxide and hot compresses to the area and it started "seeping" drainage. The patient says it is a lot better but admits it is sore where she has been "squeezing the area".  Education given on s/s of infection and worsening condition. Will let the pcp know of current findings.  . Assessed patient understanding of disease states. The patient verbalized she is going tomorrow to see the GI specialist due to her chronic diarrhea and changes in taste. The patient states she has had worsening diarrhea over the last several days. Education given that this could be related to the ABX she was prescribed. She says this has been going on for a while, just worse the last few days. The patient states she has a "nasty taste" in her mouth. It does not taste like "metal" but does taste "sour".  Education on discussing this with the GI specialist. Ask patient to write down questions to ask the GI specialist.  . Assessed patient's education and care coordination needs.  Needs a new meter to check blood glucose levels.  . Assessed patients pain level. The patient verbalized it is at a 3 today and much better than it has been. 08-30-2019: Pain level not assessed today.  . Provided disease specific education to patient.  Education on the benefits of taking blood pressures weekly and recording. The patient has not been taking lately because she does not feel good.  . Evaluation of patients physical activity level: the patient states she has been out working in her yard and mowing her yard. Denies any acute distress.  Nash Dimmer with appropriate clinical care team members regarding patient needs  Patient Self Care Activities related to HTN, HLD, GI problems,  and Chronic pain:    . Patient is unable to independently self-manage chronic health conditions  Please see past updates related to this goal by clicking on the "Past Updates" button in the selected goal          Plan:   The care management team will reach out to the patient again over the next 30 to 60 days.    Noreene Larsson RN, MSN, [place] Mobile: [phone]

## 2019-08-30 NOTE — Patient Instructions (Signed)
Visit Information  Goals Addressed              This Visit's Progress   .  RNCM- I need help with my diabetes (pt-stated)        Current Barriers:  [name] Knowledge Deficits related to basic Diabetes pathophysiology and self care/management . Knowledge Deficits related to medications used for management of diabetes . Knowledge Deficits related to adherence of a heart healthy/ADA diet . 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 daily monitoring and recording of CBG  . adherence to ADA/ carb modified diet . adherence to prescribed medication regimen  Interventions:  . Provided education to patient about basic DM disease process . Reviewed medications with patient and discussed importance of medication adherence . Discussed plans with patient for ongoing care management follow up and provided patient with direct contact information for care management team . Reviewed scheduled/upcoming provider appointments including: need to follow up with pcp, appointment to see podiatrist, endocrinology appointment  . Discussed the need to follow a Heart healthy/ADA diet and review of sugar substitutes  . Patient states she only takes her blood sugars 1 time a day and it was 120,  4 or 5 days ago.  The patient states her meter is missing and so is her toujeo pen is missing also. Her adult son and grandson live with her and the sons 9.21 year old daughter has been there and she feels like she may have gotten it and put it somewhere. This meter is over 61 years old. Will collaborate with the pcp and see if she can have an order sent to her pharmacy for a new meter that her insurance will pay for. Education given on the importance of keeping devices and medications out of the  reach of children. The patient states that the granddaughter is not there all the time and she was not thinking about her messing with her items.  The patient states she hasn't worked herself up to BID yet. Education on the importance of checking regularly to maintain health and well being . Evaluation of foot exams. The patient does not check daily, states she does need to see the podiatrist, advised to check feet daily for sores and cuts . Stated she still needed to go to the eye doctor  Patient Self Care Activities:  . UNABLE to independently manage diabetes as evidenced by a recent A1C of 14% on 10/21/18.  Reading on 05-10-2019 was 7.5.   Please see past updates related to this goal by clicking on the "Past Updates" button in the selected goal       .  RNCM: "I don't check my blood pressure" (pt-stated)        [place] (see longtitudinal plan of care for additional care plan information)  Current Barriers:  . Chronic Disease Management support, education, and care coordination needs related to HTN, HLD, GI problems, and Chronic pain  Clinical Goal(s) related to HTN, HLD, GI problems, and Chronic pain:  Over the next 120 days, patient will:  . Work with the care management team to address educational, disease management, and care coordination needs  . Begin or continue self health monitoring activities as directed today Measure and record blood pressure 2 times per week and adhere to heart healthy/ADA diet .  Call provider office for new or worsened signs and symptoms Blood pressure findings outside established parameters, Shortness of breath, and New or worsened symptom related to HLD, Chronic pain and other Chronic health conditions . Call care management team with questions or concerns . Verbalize basic understanding of patient centered plan of care established today  Interventions related to HTN, HLD, GI problems and Chronic pain:  . Evaluation of current treatment plans and  patient's adherence to plan as established by provider.  The patient verbalized that the last several days she has not felt well at all. Was started on the 10th on ABX for cutaneous Abscess at her arm pit. The patient took 3 doses and stopped because she broke out in hives.  She did not report to the provider and she stopped taking the ABX.  The patient used peroxide and hot compresses to the area and it started "seeping" drainage. The patient says it is a lot better but admits it is sore where she has been "squeezing the area".  Education given on s/s of infection and worsening condition. Will let the pcp know of current findings.  . Assessed patient understanding of disease states. The patient verbalized she is going tomorrow to see the GI specialist due to her chronic diarrhea and changes in taste. The patient states she has had worsening diarrhea over the last several days. Education given that this could be related to the ABX she was prescribed. She says this has been going on for a while, just worse the last few days. The patient states she has a "nasty taste" in her mouth. It does not taste like "metal" but does taste "sour".  Education on discussing this with the GI specialist. Ask patient to write down questions to ask the GI specialist.  . Assessed patient's education and care coordination needs.  Needs a new meter to check blood glucose levels.  . Assessed patients pain level. The patient verbalized it is at a 3 today and much better than it has been. 08-30-2019: Pain level not assessed today.  . Provided disease specific education to patient.  Education on the benefits of taking blood pressures weekly and recording. The patient has not been taking lately because she does not feel good.  . Evaluation of patients physical activity level: the patient states she has been out working in her yard and mowing her yard. Denies any acute distress.  Nash Dimmer with appropriate clinical care team members  regarding patient needs  Patient Self Care Activities related to HTN, HLD, GI problems,  and Chronic pain:  . Patient is unable to independently self-manage chronic health conditions  Please see past updates related to this goal by clicking on the "Past Updates" button in the selected goal         Patient verbalizes understanding of instructions provided today.   The care management team will reach out to the patient again over the next 30 to 60 days.   Noreene Larsson RN, MSN, [place] Mobile: [phone]

## 2019-08-31 ENCOUNTER — Other Ambulatory Visit: Payer: Self-pay

## 2019-08-31 ENCOUNTER — Encounter: Payer: Self-pay | Admitting: Gastroenterology

## 2019-08-31 ENCOUNTER — Ambulatory Visit (INDEPENDENT_AMBULATORY_CARE_PROVIDER_SITE_OTHER): Payer: Medicare Other | Admitting: Gastroenterology

## 2019-08-31 VITALS — BP 131/78 | HR 88 | Temp 98.1°F | Ht 63.0 in | Wt 203.0 lb

## 2019-08-31 DIAGNOSIS — K219 Gastro-esophageal reflux disease without esophagitis: Secondary | ICD-10-CM

## 2019-08-31 DIAGNOSIS — R197 Diarrhea, unspecified: Secondary | ICD-10-CM | POA: Insufficient documentation

## 2019-08-31 DIAGNOSIS — K529 Noninfective gastroenteritis and colitis, unspecified: Secondary | ICD-10-CM | POA: Diagnosis not present

## 2019-08-31 DIAGNOSIS — R634 Abnormal weight loss: Secondary | ICD-10-CM | POA: Diagnosis not present

## 2019-08-31 DIAGNOSIS — R1030 Lower abdominal pain, unspecified: Secondary | ICD-10-CM

## 2019-08-31 MED ORDER — DICYCLOMINE HCL 10 MG PO CAPS: 10.0000 mg | ORAL_CAPSULE | Freq: Three times a day (TID) | ORAL | 0 refills | Status: AC

## 2019-08-31 MED ORDER — NA SULFATE-K SULFATE-MG SULF 17.5-3.13-1.6 GM/177ML PO SOLN: 354.0000 mL | Freq: Once | ORAL | 0 refills | Status: AC

## 2019-08-31 NOTE — Progress Notes (Signed)
Cephas Darby, MD [address]  [place]  [place], [place] [address]  Main: [phone]  Fax: [phone]    Gastroenterology Consultation  Referring Provider:     Nobie Putnam * Primary Care Physician:  [name], DO Primary Gastroenterologist:  Dr. Cephas Darby Reason for Consultation:     Chronic diarrhea, weight loss        HPI:   Alyssa Perkins is a 69 y.o. female referred by Dr. Parks Ranger, Devonne Doughty, DO  for consultation & management of chronic diarrhea, weight loss  Chronic diarrhea: Patient reports that for last 1 year, she has been experiencing nonbloody loose bowel movements, 3-4 times during the day and 1 or 2 times at night.  It is associated with weight loss.  Generally does not eat 3 meals a day, does not have good appetite at baseline.  She does report lower abdominal cramps as well as abdominal bloating.  She denies any rectal bleeding, nausea or vomiting.  Patient does not identify any food triggers.  She does report her diarrhea is mostly postprandial.  She did not undergo stool studies to rule out infection.  She said she tried Imodium which temporarily helped.  She does acknowledge stress in her life and is planning to move to [place].  Patient is accompanied by her sister today She has history of metabolic syndrome, on insulin  Chronic GERD: Patient reports severe heartburn after she eats.  She takes over-the-counter Pepcid and Prilosec as needed.  She denies nocturnal heartburn, difficulty swallowing, nausea or vomiting  She does not smoke or drink alcohol  NSAIDs: None  Antiplts/Anticoagulants/Anti thrombotics: None  GI Procedures: Colonoscopy in 03/2010 Upper endoscopy more than 10 years ago  Past Medical History:  Diagnosis Date  . Anginal pain ([place])   . Anxiety    not on any medication at this time  . Arthritis   . Asthma    "sports asthma or exercised induced"seen at [facility] for  primary  . Chronic back pain   . Chronic neck pain   . Complication of anesthesia    "difficulty waking up from surgery"  . Depression    has OCD, states counts constantly  . GERD (gastroesophageal reflux disease)   . Headache(784.0)    hx of migraines  . Hyperlipidemia   . IBS (irritable bowel syndrome)   . PONV (postoperative nausea and vomiting)   . RLS (restless legs syndrome)   . Sleep apnea   . Thyroid disease     Past Surgical History:  Procedure Laterality Date  . ABDOMINAL HYSTERECTOMY     also had oophorectomy  . AMPUTATION TOE Right 09/05/2017   Procedure: AMPUTATION TOE-RIGHT 2ND TOE;  Surgeon: Sharlotte Alamo, DPM;  Location: [facility];  Service: Podiatry;  Laterality: Right;  . ANTERIOR CERVICAL DECOMP/DISCECTOMY FUSION  01/02/2012   Procedure: ANTERIOR CERVICAL DECOMPRESSION/DISCECTOMY FUSION 2 LEVELS;  Surgeon: Melina Schools, MD;  Location: [facility];  Service: Orthopedics;  Laterality: N/A;  ACDF C5-7  . BILATERAL CARPAL TUNNEL RELEASE    . CARDIAC CATHETERIZATION     done approx. 4- 5 years ago, Dr. Leanor Kail, does not see cardi. at this time.  . CHOLECYSTECTOMY    . DILATION AND CURETTAGE OF UTERUS     "several"  . HERNIA REPAIR    . IRRIGATION AND DEBRIDEMENT FOOT  09/05/2017   Procedure: IRRIGATION AND DEBRIDEMENT FOOT;  Surgeon: Sharlotte Alamo, DPM;  Location: [facility];  Service: Podiatry;;  .  KNEE SURGERY     1 partial left, 1 total left  . SPINE SURGERY      Current Outpatient Medications:  .  Blood Glucose Monitoring Suppl (ONETOUCH VERIO) w/Device KIT, Use to check blood sugar up to 3 x daily, Disp: 1 kit, Rfl: 0 .  cyclobenzaprine (FLEXERIL) 5 MG tablet, Take 1-2 tablets (5-10 mg total) by mouth 3 (three) times daily as needed for muscle spasms., Disp: 20 tablet, Rfl: 0 .  diclofenac sodium (VOLTAREN) 1 % GEL, Apply 2 g topically 3 (three) times daily as needed., Disp: 100 g, Rfl: 3 .  Elastic Bandages & Supports (MEDICAL COMPRESSION STOCKINGS) MISC, 1  each by Does not apply route daily., Disp: 2 each, Rfl: 0 .  empagliflozin (JARDIANCE) 25 MG TABS tablet, Take 25 mg by mouth daily with breakfast., Disp: , Rfl:  .  famotidine (PEPCID) 20 MG tablet, Take 1 tablet (20 mg total) by mouth daily., Disp: 14 tablet, Rfl: 0 .  gabapentin (NEURONTIN) 100 MG capsule, Take 2 capsules (200 mg total) by mouth in the morning, at noon, and at bedtime., Disp: 540 capsule, Rfl: 1 .  hydrOXYzine (ATARAX/VISTARIL) 25 MG tablet, Take 1 tablet (25 mg total) by mouth daily as needed for itching. For itching (Patient taking differently: Take 25 mg by mouth at bedtime. For itching), Disp: 90 tablet, Rfl: 1 .  ibuprofen (ADVIL,MOTRIN) 200 MG tablet, Take 600-800 mg by mouth every 6 (six) hours as needed for moderate pain. , Disp: , Rfl:  .  Insulin Glargine, 1 Unit Dial, (TOUJEO SOLOSTAR) 300 UNIT/ML SOPN, Inject 70 Units into the skin daily. (Patient taking differently: Inject 76 Units into the skin daily. ), Disp: 15 mL, Rfl: 3 .  Insulin Pen Needle 31G X 5 MM MISC, 1 each by Does not apply route daily. Use to inject Toujeo daily. Dx:E11.9, Disp: 100 each, Rfl: 0 .  omeprazole (PRILOSEC) 20 MG capsule, Take 1 capsule (20 mg total) by mouth daily., Disp: 90 capsule, Rfl: 3 .  ondansetron (ZOFRAN-ODT) 4 MG disintegrating tablet, Take 1 tablet (4 mg total) by mouth every 8 (eight) hours as needed for nausea or vomiting., Disp: 20 tablet, Rfl: 0 .  OneTouch Delica Lancets 57S MISC, Use to check blood sugar up to 3 x daily, Disp: 300 each, Rfl: 2 .  ONETOUCH VERIO test strip, Check blood sugar up to 3 x daily, Disp: 300 each, Rfl: 2 .  quinapril (ACCUPRIL) 20 MG tablet, Take 1 tablet (20 mg total) by mouth at bedtime., Disp: 90 tablet, Rfl: 3 .  Semaglutide,0.25 or 0.5MG/DOS, (OZEMPIC, 0.25 OR 0.5 MG/DOSE,) 2 MG/1.5ML SOPN, Inject 0.5 mg into the skin once a week., Disp: , Rfl:  .  sertraline (ZOLOFT) 50 MG tablet, Take 1 tablet (50 mg total) by mouth daily., Disp: 90 tablet,  Rfl: 3 .  simvastatin (ZOCOR) 40 MG tablet, Take 1 tablet (40 mg total) by mouth at bedtime. Reported on 05/16/2015, Disp: 90 tablet, Rfl: 3 .  traMADol (ULTRAM) 50 MG tablet, Take 1 tablet (50 mg total) by mouth every 6 (six) hours as needed., Disp: 20 tablet, Rfl: 0 .  traZODone (DESYREL) 50 MG tablet, Take 1 tablet (50 mg total) by mouth at bedtime., Disp: 90 tablet, Rfl: 3 .  dicyclomine (BENTYL) 10 MG capsule, Take 1 capsule (10 mg total) by mouth 4 (four) times daily -  before meals and at bedtime., Disp: 90 capsule, Rfl: 0 .  Na Sulfate-K Sulfate-Mg Sulf 17.5-3.13-1.6 GM/177ML SOLN,  Take 354 mLs by mouth once for 1 dose., Disp: 354 mL, Rfl: 0   Family History  Problem Relation Age of Onset  . Heart disease Mother   . Osteoporosis Mother   . Breast cancer Mother        dx with ovarian ca 1st  . Ovarian cancer Mother        26's  . Osteoporosis Sister   . Diabetes Sister   . Breast cancer Sister        early 67's no braca testing   . Cancer Brother        sinus, bone  . Osteoporosis Brother      Social History   Tobacco Use  . Smoking status: Former Smoker    Packs/day: 0.50    Years: 15.00    Pack years: 7.50    Types: Cigarettes    Quit date: 07/17/1994    Years since quitting: 25.1  . Smokeless tobacco: Former Systems developer  . Tobacco comment: stopped 18 years ago  Vaping Use  . Vaping Use: Never used  Substance Use Topics  . Alcohol use: No  . Drug use: No    Allergies as of 08/31/2019 - Review Complete 08/31/2019  Allergen Reaction Noted  . Bactrim [sulfamethoxazole-trimethoprim] Rash 08/31/2019    Review of Systems:    All systems reviewed and negative except where noted in HPI.   Physical Exam:  BP 131/78 (BP Location: Left Arm, Patient Position: Sitting, Cuff Size: Normal)   Pulse 88   Temp 98.1 F (36.7 C) (Oral)   Ht _0  (1.6 m)   Wt 203 lb (92.1 kg)   BMI 35.96 kg/m  No LMP recorded. Patient has had a hysterectomy.  General:   Alert,   Well-developed, well-nourished, pleasant and cooperative in NAD Head:  Normocephalic and atraumatic. Eyes:  Sclera clear, no icterus.   Conjunctiva pink. Ears:  Normal auditory acuity. Nose:  No deformity, discharge, or lesions. Mouth:  No deformity or lesions,oropharynx pink & moist. Neck:  Supple; no masses or thyromegaly. Lungs:  Respirations even and unlabored.  Clear throughout to auscultation.   No wheezes, crackles, or rhonchi. No acute distress. Heart:  Regular rate and rhythm; no murmurs, clicks, rubs, or gallops. Abdomen:  Normal bowel sounds. Soft, non-tender and non-distended without masses, hepatosplenomegaly or hernias noted.  No guarding or rebound tenderness.   Rectal: Not performed Msk:  Symmetrical without gross deformities. Good, equal movement & strength bilaterally. Pulses:  Normal pulses noted. Extremities:  No clubbing or edema.  No cyanosis. Neurologic:  Alert and oriented x3;  grossly normal neurologically. Skin:  Intact without significant lesions or rashes. No jaundice. Psych:  Alert and cooperative. Normal mood and affect.  Imaging Studies: No recent abdominal imaging  Assessment and Plan:   Alyssa Perkins is a 69 y.o. female with metabolic syndrome is seen in consultation for chronic GERD and chronic diarrhea with unintentional weight loss  Chronic GERD Discussed about antireflux lifestyle Continue Prilosec and Pepcid for now Recommend EGD for further evaluation  Chronic diarrhea and unintentional weight loss Recommend stool studies to rule out infection, fecal calprotectin levels, celiac disease panel, pancreatic fecal elastase levels If above work-up is negative, recommend EGD and colonoscopy with TI evaluation, gastric and duodenal biopsies, random colon biopsies Trial of Bentyl 10 mg before each meal and at bedtime   Follow up in 6 to 8 weeks   Cephas Darby, MD

## 2019-09-02 LAB — CELIAC DISEASE PANEL
Endomysial IgA: NEGATIVE
IgA/Immunoglobulin A, Serum: 515 mg/dL — ABNORMAL HIGH (ref 87–352)
Transglutaminase IgA: 2 U/mL (ref 0–3)

## 2019-09-07 ENCOUNTER — Other Ambulatory Visit: Payer: Self-pay | Admitting: Family Medicine

## 2019-09-07 DIAGNOSIS — IMO0002 Reserved for concepts with insufficient information to code with codable children: Secondary | ICD-10-CM

## 2019-09-07 MED ORDER — ONETOUCH VERIO VI STRP: ORAL_STRIP | 2 refills | Status: DC

## 2019-09-07 NOTE — Telephone Encounter (Signed)
Requested Prescriptions  Pending Prescriptions Disp Refills  . ONETOUCH VERIO test strip 300 each 2    Sig: Check blood sugar up to 3 x daily     Endocrinology: Diabetes - Testing Supplies Passed - 09/07/2019 11:00 AM      Passed - Valid encounter within last 12 months    Recent Outpatient Visits          1 week ago Cutaneous abscess of right axilla   [facility], Lupita Raider, FNP   4 months ago Type 2 diabetes mellitus with diabetic neuropathy, with long-term current use of insulin [facility])   [place], DO   5 months ago Suspected COVID-19 virus infection   [place], DO   10 months ago Uncontrolled type 2 diabetes mellitus with diabetic neuropathy, with long-term current use of insulin [facility])   [place], DO   1 year ago Elevated serum glucose with glucosuria   [place], [place]

## 2019-09-07 NOTE — Telephone Encounter (Signed)
After 2 attempts to send prescription electronically- called [facility] and was informed by pharmacist that the prescription cannot say "up" to 3 times daily. Pharmacist stated it must have NPI code and dx code and a specific quantity, not "prn."  Routing to office to complete this information.

## 2019-09-07 NOTE — Telephone Encounter (Signed)
Medication Refill - Medication: ONETOUCH VERIO test strip   Pt would like to be notified when these are sent in, states her pharmacy does not communicate properly with her.  Has the patient contacted their pharmacy? Yes.   (Agent: If no, request that the patient contact the pharmacy for the refill.) (Agent: If yes, when and what did the pharmacy advise?)  Preferred Pharmacy (with phone number or street name):  [facility] [place] [facility] [place] [place] [address]  Phone: [phone] Fax: [phone]     Agent: Please be advised that RX refills may take up to 3 business days. We ask that you follow-up with your pharmacy.

## 2019-09-07 NOTE — Telephone Encounter (Signed)
Initial class was print- resent electronically (normal)

## 2019-09-07 NOTE — Addendum Note (Signed)
Addended by: Corky Sox E on: 09/07/2019 11:04 AM   Modules accepted: Orders

## 2019-09-08 MED ORDER — ONETOUCH VERIO VI STRP: ORAL_STRIP | 5 refills | Status: DC

## 2019-09-08 NOTE — Addendum Note (Signed)
Addended by: [name] on: 09/08/2019 05:46 PM   Modules accepted: Orders

## 2019-09-08 NOTE — Telephone Encounter (Signed)
Pt following up on refill request for the verio test strips. Please correct wording in Rx and have dx code and npi number on Rx.  Pt has been without all week and going out of town on Friday

## 2019-09-08 NOTE — Telephone Encounter (Signed)
New rx sent as requested.  Here is copy of the edited new rx.  ONETOUCH VERIO test strip [[id number]]   Order Details Dose, Route, Frequency: As Directed  Dispense Quantity: 300 each Refills: 5   Note to Pharmacy: For use with One Touch Verio Meter. Diagnosis codes: E11.49, E11.65. provider NPI [id number]      Sig: Check blood sugar 3 x daily      Start Date: 09/08/19 End Date: --  Written Date: 09/08/19 Expiration Date: 09/07/20    Diagnosis Association: DM (diabetes mellitus), type 2, uncontrolled w/neurologic complication ([place]) (R83.81 , E11.65)  Original Order:  Roma Schanz test strip [[id number]]   Nobie Putnam, DO [facility] 09/08/2019, 5:46 PM

## 2019-09-21 ENCOUNTER — Other Ambulatory Visit: Payer: Self-pay

## 2019-09-21 MED ORDER — GOLYTELY 236 G PO SOLR: 4000.0000 mL | Freq: Once | ORAL | 0 refills | Status: AC

## 2019-09-21 NOTE — Progress Notes (Unsigned)
Pharmacy sent a fax stating that the suprep was to expensive wanted something cheaper. Sent golytely to pharmacy

## 2019-09-22 ENCOUNTER — Ambulatory Visit (INDEPENDENT_AMBULATORY_CARE_PROVIDER_SITE_OTHER): Payer: Medicare Other | Admitting: Pharmacist

## 2019-09-22 ENCOUNTER — Telehealth: Payer: Self-pay

## 2019-09-22 DIAGNOSIS — E785 Hyperlipidemia, unspecified: Secondary | ICD-10-CM | POA: Diagnosis not present

## 2019-09-22 DIAGNOSIS — I10 Essential (primary) hypertension: Secondary | ICD-10-CM | POA: Diagnosis not present

## 2019-09-22 DIAGNOSIS — E1140 Type 2 diabetes mellitus with diabetic neuropathy, unspecified: Secondary | ICD-10-CM | POA: Diagnosis not present

## 2019-09-22 DIAGNOSIS — Z794 Long term (current) use of insulin: Secondary | ICD-10-CM | POA: Diagnosis not present

## 2019-09-22 DIAGNOSIS — E1169 Type 2 diabetes mellitus with other specified complication: Secondary | ICD-10-CM | POA: Diagnosis not present

## 2019-09-22 NOTE — Patient Instructions (Signed)
Thank you allowing the Chronic Care Management Team to be a part of your care! It was a pleasure speaking with you today!     CCM (Chronic Care Management) Team    Noreene Larsson RN, MSN, CCM Nurse Care Coordinator  [phone]   [name] PharmD  Clinical Pharmacist  [phone]   Eula Fried LCSW Clinical Social Worker [phone]  Visit Information  Goals Addressed              This Visit's Progress   .  PharmD- Medication Adherence (pt-stated)        Current Barriers:  [name] Knowledge deficits related to impact of diet on blood sugar and medications to manage blood sugar . Non Adherence to prescribed medication regimen . Financial Barriers . Lack of blood sugar results for clinical team  Pharmacist Clinical Goal(s):  [name] Over the next 30 days, patient will work with CM Pharmacist to address needs related to obtaining a blood glucose meter, medication adherence and medication regimen optimization  Interventions: . Perform chart review o Note patient seen by [place] on 6/15 - Started on a trial of Bentyl 10 mg before each meal and at bedtime o Note colonoscopy and EGD scheduled for 7/14 . Counsel on importance of medication adherence o Reports has been taking dicyclomine 10 mg - four times daily as needed. Reports diarrhea improved, but constipating if takes all four times/day o Identify patient in need of refill of Ozempic - advise patient to call to order refill today. Myles Rosenthal on importance of blood sugar control and monitoring o Reports currently taking: - Toujeo 76 units once daily - Jardiance 25 mg once daily - Ozempic 0.5 mg weekly (on Fridays) o Reports recent CBGs ranging: 110s-160s o Counsel patient to monitor blood sugars fasting each morning and again before bed as directed by Endocrinologist . Encourage patient to restart monitoring home blood pressure and keep log of results o Reports currently unsure of where BP monitor as she  is still in the process of moving in with her sister . Coordination of care: Encourage patient to schedule appointment with Endocrinology, as patient due for 8-month follow up. . Encourage patient to schedule follow up appointment with PCP   Patient Self Care Activities:  . Patient to check blood sugars twice daily as directed and keep log . Patient to attend provider appointments as scheduled   Please see past updates related to this goal by clicking on the "Past Updates" button in the selected goal         Patient verbalizes understanding of instructions provided today.   The care management team will reach out to the patient again over the next 30 days.   [name], PharmD, [place] [employer] [phone]

## 2019-09-22 NOTE — Chronic Care Management (AMB) (Signed)
Chronic Care Management   Follow Up Note   09/22/2019 Name: Alyssa Perkins MRN: [id number] DOB: 09/18/1950  Referred by: [name], DO Reason for referral : Chronic Care Management (Patient Phone Call)   Alyssa Perkins is a 69 y.o. year old female who is a primary care patient of [name], DO. The CCM team was consulted for assistance with chronic disease management and care coordination needs.    I reached out to [name] by phone today.   Review of patient status, including review of consultants reports, relevant laboratory and other test results, and collaboration with appropriate care team members and the patient's provider was performed as part of comprehensive patient evaluation and provision of chronic care management services.      Outpatient Encounter Medications as of 09/22/2019  Medication Sig   empagliflozin (JARDIANCE) 25 MG TABS tablet Take 25 mg by mouth daily with breakfast.   Insulin Glargine, 1 Unit Dial, (TOUJEO SOLOSTAR) 300 UNIT/ML SOPN Inject 70 Units into the skin daily. (Patient taking differently: Inject 76 Units into the skin daily. )   Semaglutide,0.25 or 0.'5MG'$ /DOS, (OZEMPIC, 0.25 OR 0.5 MG/DOSE,) 2 MG/1.5ML SOPN Inject 0.5 mg into the skin once a week.   Blood Glucose Monitoring Suppl (ONETOUCH VERIO) w/Device KIT Use to check blood sugar up to 3 x daily   cyclobenzaprine (FLEXERIL) 5 MG tablet Take 1-2 tablets (5-10 mg total) by mouth 3 (three) times daily as needed for muscle spasms.   diclofenac sodium (VOLTAREN) 1 % GEL Apply 2 g topically 3 (three) times daily as needed.   dicyclomine (BENTYL) 10 MG capsule Take 1 capsule (10 mg total) by mouth 4 (four) times daily -  before meals and at bedtime.   Elastic Bandages & Supports (MEDICAL COMPRESSION STOCKINGS) MISC 1 each by Does not apply route daily.   famotidine (PEPCID) 20 MG tablet Take 1 tablet (20 mg total) by mouth daily.   gabapentin  (NEURONTIN) 100 MG capsule Take 2 capsules (200 mg total) by mouth in the morning, at noon, and at bedtime.   hydrOXYzine (ATARAX/VISTARIL) 25 MG tablet Take 1 tablet (25 mg total) by mouth daily as needed for itching. For itching (Patient taking differently: Take 25 mg by mouth at bedtime. For itching)   ibuprofen (ADVIL,MOTRIN) 200 MG tablet Take 600-800 mg by mouth every 6 (six) hours as needed for moderate pain.    Insulin Pen Needle 31G X 5 MM MISC 1 each by Does not apply route daily. Use to inject Toujeo daily. Dx:E11.9   omeprazole (PRILOSEC) 20 MG capsule Take 1 capsule (20 mg total) by mouth daily.   ondansetron (ZOFRAN-ODT) 4 MG disintegrating tablet Take 1 tablet (4 mg total) by mouth every 8 (eight) hours as needed for nausea or vomiting.   OneTouch Delica Lancets 43P MISC Use to check blood sugar up to 3 x daily   ONETOUCH VERIO test strip Check blood sugar 3 x daily   quinapril (ACCUPRIL) 20 MG tablet Take 1 tablet (20 mg total) by mouth at bedtime.   sertraline (ZOLOFT) 50 MG tablet Take 1 tablet (50 mg total) by mouth daily.   simvastatin (ZOCOR) 40 MG tablet Take 1 tablet (40 mg total) by mouth at bedtime. Reported on 05/16/2015   traMADol (ULTRAM) 50 MG tablet Take 1 tablet (50 mg total) by mouth every 6 (six) hours as needed.   traZODone (DESYREL) 50 MG tablet Take 1 tablet (50 mg total) by mouth at bedtime.   [  DISCONTINUED] ranitidine (ZANTAC) 300 MG tablet Take 1 tablet (300 mg total) by mouth daily.   No facility-administered encounter medications on file as of 09/22/2019.    Goals Addressed              This Visit's Progress     PharmD- Medication Adherence (pt-stated)        Current Barriers:   Knowledge deficits related to impact of diet on blood sugar and medications to manage blood sugar  Non Adherence to prescribed medication regimen  Financial Barriers  Lack of blood sugar results for clinical team  Pharmacist Clinical Goal(s):   Over the  next 30 days, patient will work with CM Pharmacist to address needs related to obtaining a blood glucose meter, medication adherence and medication regimen optimization  Interventions:  Perform chart review o Note patient seen by [place] on 6/15 - Started on a trial of Bentyl 10 mg before each meal and at bedtime o Note colonoscopy and EGD scheduled for 7/14  Counsel on importance of medication adherence o Reports has been taking dicyclomine 10 mg - four times daily as needed. Reports diarrhea improved, but constipating if takes all four times/day o Identify patient in need of refill of Ozempic - advise patient to call to order refill today.  Counsel on importance of blood sugar control and monitoring o Reports currently taking: - Toujeo 76 units once daily - Jardiance 25 mg once daily - Ozempic 0.5 mg weekly (on Fridays) o Reports recent CBGs ranging: 110s-160s o Counsel patient to monitor blood sugars fasting each morning and again before bed as directed by Endocrinologist  Encourage patient to restart monitoring home blood pressure and keep log of results o Reports currently unsure of where BP monitor as she is still in the process of moving in with her sister  Coordination of care: Encourage patient to schedule appointment with Endocrinology, as patient due for 55-monthfollow up.  Encourage patient to schedule follow up appointment with PCP   Patient Self Care Activities:   Patient to check blood sugars twice daily as directed and keep log  Patient to attend provider appointments as scheduled   Please see past updates related to this goal by clicking on the "Past Updates" button in the selected goal         Plan  The care management team will reach out to the patient again over the next 30 days.   EHarlow Asa PharmD, [phone]

## 2019-09-23 DIAGNOSIS — R634 Abnormal weight loss: Secondary | ICD-10-CM | POA: Diagnosis not present

## 2019-09-23 DIAGNOSIS — K529 Noninfective gastroenteritis and colitis, unspecified: Secondary | ICD-10-CM | POA: Diagnosis not present

## 2019-09-23 DIAGNOSIS — R1030 Lower abdominal pain, unspecified: Secondary | ICD-10-CM | POA: Diagnosis not present

## 2019-09-24 ENCOUNTER — Telehealth: Payer: Self-pay

## 2019-09-24 NOTE — Telephone Encounter (Signed)
Copied from [place] [id number]. Topic: Referral - Request for Referral >> Sep 23, 2019 92:33 PM Doyce Loose D wrote: Has patient seen PCP for this complaint? No *If NO, is insurance requiring patient see PCP for this issue before PCP can refer them? Referral for which specialty: Cancer Preferred provider/office:  Reason for referral: Pt is not sure if she has Cancer and wants to be tested

## 2019-09-24 NOTE — Telephone Encounter (Signed)
This does not sound like lung cancer. The CT scan for lung cancer screening can only be done if patient is active smoker. Or only recently quit within past few years.  I do not think she meets the criteria for this.  Itching symptoms can be caused by many other problems other than cancer.  We cannot refer to a cancer specialist unless there is a specific reason to treat a cancer. I would not recommend that referral now.  One option is to do a Chest X-ray if she is worried about her lungs. We can order that for any patient with history of cough or shortness of breath at times. She can do walk in without appointment anytime 1-2 weeks if she would like to do an X-ray - it can show signs of a lung cancer if it is present.  Nobie Putnam, [facility] 09/24/2019, 10:27 AM

## 2019-09-24 NOTE — Telephone Encounter (Signed)
Called patient on 09/23/2019 and left message did not hear any reply back--called patient today as well -- patient states she is concerned since she has itching going on --on her neck, legs, back and thigh area from long time that she had talk to Dr Raliegh Ip about in past and family Hx of lung cancer -wants to get screening test done. Please suggest ?

## 2019-09-24 NOTE — Telephone Encounter (Signed)
Patient advised as per Dr K. 

## 2019-09-27 ENCOUNTER — Other Ambulatory Visit: Payer: Self-pay

## 2019-09-27 ENCOUNTER — Other Ambulatory Visit
Admission: RE | Admit: 2019-09-27 | Discharge: 2019-09-27 | Disposition: A | Discharge status: Home / Self Care | Payer: Medicare Other | Source: Ambulatory Visit | Attending: Gastroenterology | Admitting: Gastroenterology

## 2019-09-27 DIAGNOSIS — Z01812 Encounter for preprocedural laboratory examination: Secondary | ICD-10-CM | POA: Insufficient documentation

## 2019-09-27 DIAGNOSIS — Z20822 Contact with and (suspected) exposure to covid-19: Secondary | ICD-10-CM | POA: Insufficient documentation

## 2019-09-27 LAB — SARS CORONAVIRUS 2 (TAT 6-24 HRS): SARS Coronavirus 2: NEGATIVE

## 2019-09-28 LAB — GI PROFILE, STOOL, PCR

## 2019-09-28 LAB — CALPROTECTIN, FECAL: Calprotectin, Fecal: 210 ug/g — ABNORMAL HIGH (ref 0–120)

## 2019-09-28 LAB — PANCREATIC ELASTASE, FECAL: Pancreatic Elastase, Fecal: 319 ug Elast./g (ref >200)

## 2019-09-29 ENCOUNTER — Encounter
Admission: RE | Disposition: A | Discharge status: Home / Self Care | Payer: Self-pay | Source: Home / Self Care | Attending: Gastroenterology

## 2019-09-29 ENCOUNTER — Ambulatory Visit: Payer: Medicare Other | Admitting: Certified Registered Nurse Anesthetist

## 2019-09-29 ENCOUNTER — Other Ambulatory Visit: Payer: Self-pay

## 2019-09-29 ENCOUNTER — Encounter: Payer: Self-pay | Admitting: Gastroenterology

## 2019-09-29 ENCOUNTER — Ambulatory Visit
Admission: RE | Admit: 2019-09-29 | Discharge: 2019-09-29 | Disposition: A | Discharge status: Home / Self Care | Payer: Medicare Other | Attending: Gastroenterology | Admitting: Gastroenterology

## 2019-09-29 DIAGNOSIS — K219 Gastro-esophageal reflux disease without esophagitis: Secondary | ICD-10-CM | POA: Insufficient documentation

## 2019-09-29 DIAGNOSIS — R634 Abnormal weight loss: Secondary | ICD-10-CM | POA: Insufficient documentation

## 2019-09-29 DIAGNOSIS — D123 Benign neoplasm of transverse colon: Secondary | ICD-10-CM | POA: Insufficient documentation

## 2019-09-29 DIAGNOSIS — F329 Major depressive disorder, single episode, unspecified: Secondary | ICD-10-CM | POA: Insufficient documentation

## 2019-09-29 DIAGNOSIS — F419 Anxiety disorder, unspecified: Secondary | ICD-10-CM | POA: Diagnosis not present

## 2019-09-29 DIAGNOSIS — Z79899 Other long term (current) drug therapy: Secondary | ICD-10-CM | POA: Diagnosis not present

## 2019-09-29 DIAGNOSIS — R197 Diarrhea, unspecified: Secondary | ICD-10-CM

## 2019-09-29 DIAGNOSIS — E785 Hyperlipidemia, unspecified: Secondary | ICD-10-CM | POA: Insufficient documentation

## 2019-09-29 DIAGNOSIS — I10 Essential (primary) hypertension: Secondary | ICD-10-CM | POA: Diagnosis not present

## 2019-09-29 DIAGNOSIS — G4730 Sleep apnea, unspecified: Secondary | ICD-10-CM | POA: Insufficient documentation

## 2019-09-29 DIAGNOSIS — Z87891 Personal history of nicotine dependence: Secondary | ICD-10-CM | POA: Diagnosis not present

## 2019-09-29 DIAGNOSIS — E1149 Type 2 diabetes mellitus with other diabetic neurological complication: Secondary | ICD-10-CM | POA: Diagnosis not present

## 2019-09-29 DIAGNOSIS — K529 Noninfective gastroenteritis and colitis, unspecified: Secondary | ICD-10-CM | POA: Insufficient documentation

## 2019-09-29 DIAGNOSIS — Z794 Long term (current) use of insulin: Secondary | ICD-10-CM | POA: Insufficient documentation

## 2019-09-29 DIAGNOSIS — Z833 Family history of diabetes mellitus: Secondary | ICD-10-CM | POA: Insufficient documentation

## 2019-09-29 DIAGNOSIS — E119 Type 2 diabetes mellitus without complications: Secondary | ICD-10-CM | POA: Diagnosis not present

## 2019-09-29 DIAGNOSIS — M1990 Unspecified osteoarthritis, unspecified site: Secondary | ICD-10-CM | POA: Diagnosis not present

## 2019-09-29 DIAGNOSIS — Z8249 Family history of ischemic heart disease and other diseases of the circulatory system: Secondary | ICD-10-CM | POA: Insufficient documentation

## 2019-09-29 DIAGNOSIS — D122 Benign neoplasm of ascending colon: Secondary | ICD-10-CM | POA: Insufficient documentation

## 2019-09-29 DIAGNOSIS — G4733 Obstructive sleep apnea (adult) (pediatric): Secondary | ICD-10-CM | POA: Diagnosis not present

## 2019-09-29 DIAGNOSIS — K635 Polyp of colon: Secondary | ICD-10-CM

## 2019-09-29 DIAGNOSIS — J45909 Unspecified asthma, uncomplicated: Secondary | ICD-10-CM | POA: Diagnosis not present

## 2019-09-29 HISTORY — PX: COLONOSCOPY WITH PROPOFOL: SHX5780

## 2019-09-29 HISTORY — PX: ESOPHAGOGASTRODUODENOSCOPY (EGD) WITH PROPOFOL: SHX5813

## 2019-09-29 LAB — GLUCOSE, CAPILLARY: Glucose-Capillary: 101 mg/dL — ABNORMAL HIGH (ref 70–99)

## 2019-09-29 SURGERY — COLONOSCOPY WITH PROPOFOL
Anesthesia: General

## 2019-09-29 MED ORDER — PROPOFOL 500 MG/50ML IV EMUL
INTRAVENOUS | Status: AC
  Filled 2019-09-29: qty 50

## 2019-09-29 MED ORDER — PEG 3350-KCL-NABCB-NACL-NASULF 236 G PO SOLR: 4000.0000 mL | Freq: Once | ORAL | 0 refills | Status: AC

## 2019-09-29 MED ORDER — LIDOCAINE HCL (CARDIAC) PF 100 MG/5ML IV SOSY
PREFILLED_SYRINGE | INTRAVENOUS | Status: DC | PRN
  Administered 2019-09-29: 100 mg via INTRAVENOUS

## 2019-09-29 MED ORDER — SODIUM CHLORIDE 0.9 % IV SOLN: INTRAVENOUS | Status: DC

## 2019-09-29 MED ORDER — PROPOFOL 10 MG/ML IV BOLUS
INTRAVENOUS | Status: DC | PRN
  Administered 2019-09-29 (×2): 20 mg via INTRAVENOUS
  Administered 2019-09-29: 60 mg via INTRAVENOUS

## 2019-09-29 MED ORDER — PROPOFOL 500 MG/50ML IV EMUL
INTRAVENOUS | Status: DC | PRN
  Administered 2019-09-29: 150 ug/kg/min via INTRAVENOUS

## 2019-09-29 MED ORDER — LIDOCAINE HCL (PF) 2 % IJ SOLN
INTRAMUSCULAR | Status: AC
  Filled 2019-09-29: qty 5

## 2019-09-29 NOTE — H&P (Signed)
Cephas Darby, MD [address]  [place]  [place], [place] [address]  Main: [phone]  Fax: [phone] Pager: [phone]  Primary Care Physician:  [name], DO Primary Gastroenterologist:  Dr. Cephas Darby  Pre-Procedure History & Physical: HPI:  Alyssa Perkins is a 69 y.o. female is here for an endoscopy and colonoscopy.   Past Medical History:  Diagnosis Date  . Anginal pain ([place])   . Anxiety    not on any medication at this time  . Arthritis   . Asthma    "sports asthma or exercised induced"seen at [facility] for primary  . Chronic back pain   . Chronic neck pain   . Complication of anesthesia    "difficulty waking up from surgery"  . Depression    has OCD, states counts constantly  . GERD (gastroesophageal reflux disease)   . Headache(784.0)    hx of migraines  . Hyperlipidemia   . IBS (irritable bowel syndrome)   . PONV (postoperative nausea and vomiting)   . RLS (restless legs syndrome)   . Sleep apnea   . Thyroid disease     Past Surgical History:  Procedure Laterality Date  . ABDOMINAL HYSTERECTOMY     also had oophorectomy  . AMPUTATION TOE Right 09/05/2017   Procedure: AMPUTATION TOE-RIGHT 2ND TOE;  Surgeon: Sharlotte Alamo, DPM;  Location: [facility];  Service: Podiatry;  Laterality: Right;  . ANTERIOR CERVICAL DECOMP/DISCECTOMY FUSION  01/02/2012   Procedure: ANTERIOR CERVICAL DECOMPRESSION/DISCECTOMY FUSION 2 LEVELS;  Surgeon: Melina Schools, MD;  Location: [facility];  Service: Orthopedics;  Laterality: N/A;  ACDF C5-7  . BILATERAL CARPAL TUNNEL RELEASE    . CARDIAC CATHETERIZATION     done approx. 4- 5 years ago, Dr. Leanor Kail, does not see cardi. at this time.  . CHOLECYSTECTOMY    . DILATION AND CURETTAGE OF UTERUS     "several"  . HERNIA REPAIR    . IRRIGATION AND DEBRIDEMENT FOOT  09/05/2017   Procedure: IRRIGATION AND DEBRIDEMENT FOOT;  Surgeon: Sharlotte Alamo, DPM;  Location: [facility];  Service:  Podiatry;;  . KNEE SURGERY     1 partial left, 1 total left  . SPINE SURGERY      Prior to Admission medications   Medication Sig Start Date End Date Taking? Authorizing Provider  cyclobenzaprine (FLEXERIL) 5 MG tablet Take 1-2 tablets (5-10 mg total) by mouth 3 (three) times daily as needed for muscle spasms. 05/15/19  Yes Duanne Guess, PA-C  diclofenac sodium (VOLTAREN) 1 % GEL Apply 2 g topically 3 (three) times daily as needed. 11/13/17  Yes Karamalegos, Devonne Doughty, DO  empagliflozin (JARDIANCE) 25 MG TABS tablet Take 25 mg by mouth daily with breakfast. 02/08/19 02/08/20 Yes [provider]  gabapentin (NEURONTIN) 100 MG capsule Take 2 capsules (200 mg total) by mouth in the morning, at noon, and at bedtime. 05/10/19  Yes Karamalegos, Devonne Doughty, DO  Insulin Glargine, 1 Unit Dial, (TOUJEO SOLOSTAR) 300 UNIT/ML SOPN Inject 70 Units into the skin daily. Patient taking differently: Inject 76 Units into the skin daily.  05/10/19  Yes Karamalegos, Devonne Doughty, DO  omeprazole (PRILOSEC) 20 MG capsule Take 1 capsule (20 mg total) by mouth daily. 03/13/18  Yes Karamalegos, Devonne Doughty, DO  Semaglutide,0.25 or 0.5MG/DOS, (OZEMPIC, 0.25 OR 0.5 MG/DOSE,) 2 MG/1.5ML SOPN Inject 0.5 mg into the skin once a week.   Yes [provider]  sertraline (ZOLOFT) 50 MG tablet Take 1  tablet (50 mg total) by mouth daily. 02/17/19  Yes Karamalegos, Devonne Doughty, DO  simvastatin (ZOCOR) 40 MG tablet Take 1 tablet (40 mg total) by mouth at bedtime. Reported on 05/16/2015 02/17/19  Yes Karamalegos, Devonne Doughty, DO  traZODone (DESYREL) 50 MG tablet Take 1 tablet (50 mg total) by mouth at bedtime. 02/17/19  Yes Karamalegos, Devonne Doughty, DO  Blood Glucose Monitoring Suppl (ONETOUCH VERIO) w/Device KIT Use to check blood sugar up to 3 x daily 08/30/19   Karamalegos, Devonne Doughty, DO  dicyclomine (BENTYL) 10 MG capsule Take 1 capsule (10 mg total) by mouth 4 (four) times daily -  before meals and at  bedtime. Patient not taking: Reported on 09/29/2019 08/31/19   Lin Landsman, MD  Elastic Bandages & Supports (MEDICAL COMPRESSION STOCKINGS) [place] 1 each by Does not apply route daily. 11/14/15   Luciana Axe, NP  famotidine (PEPCID) 20 MG tablet Take 1 tablet (20 mg total) by mouth daily. Patient not taking: Reported on 09/29/2019 01/26/19 01/26/20  Cuthriell, Charline Bills, PA-C  hydrOXYzine (ATARAX/VISTARIL) 25 MG tablet Take 1 tablet (25 mg total) by mouth daily as needed for itching. For itching Patient taking differently: Take 25 mg by mouth at bedtime. For itching 02/17/19   [name], DO  ibuprofen (ADVIL,MOTRIN) 200 MG tablet Take 600-800 mg by mouth every 6 (six) hours as needed for moderate pain.     [provider]  Insulin Pen Needle 31G X 5 MM MISC 1 each by Does not apply route daily. Use to inject Toujeo daily. Dx:E11.9 02/17/19   Karamalegos, Devonne Doughty, DO  ondansetron (ZOFRAN-ODT) 4 MG disintegrating tablet Take 1 tablet (4 mg total) by mouth every 8 (eight) hours as needed for nausea or vomiting. 01/26/19   Cuthriell, Charline Bills, PA-C  OneTouch Delica Lancets 27N MISC Use to check blood sugar up to 3 x daily 10/29/18   Parks Ranger, Devonne Doughty, DO  [facility] VERIO test strip Check blood sugar 3 x daily 09/08/19   [name], DO  quinapril (ACCUPRIL) 20 MG tablet Take 1 tablet (20 mg total) by mouth at bedtime. Patient not taking: Reported on 09/29/2019 02/17/19   [name], DO  traMADol (ULTRAM) 50 MG tablet Take 1 tablet (50 mg total) by mouth every 6 (six) hours as needed. 05/15/19 05/14/20  Duanne Guess, PA-C  ranitidine (ZANTAC) 300 MG tablet Take 1 tablet (300 mg total) by mouth daily. 01/26/19 01/26/19  Cuthriell, Charline Bills, PA-C    Allergies as of 08/31/2019 - Review Complete 08/31/2019  Allergen Reaction Noted  . Bactrim [sulfamethoxazole-trimethoprim] Rash 08/31/2019    Family History  Problem Relation Age of  Onset  . Heart disease Mother   . Osteoporosis Mother   . Breast cancer Mother        dx with ovarian ca 1st  . Ovarian cancer Mother        50's  . Osteoporosis Sister   . Diabetes Sister   . Breast cancer Sister        early 17's no braca testing   . Cancer Brother        sinus, bone  . Osteoporosis Brother     Social History   Socioeconomic History  . Marital status: Widowed    Spouse name: Not on file  . Number of children: Not on file  . Years of education: 68  . Highest education level: 12th grade  Occupational History  . Not on file  Tobacco Use  . Smoking status: Former Smoker    Packs/day: 0.50    Years: 15.00    Pack years: 7.50    Types: Cigarettes    Quit date: 07/17/1994    Years since quitting: 25.2  . Smokeless tobacco: Former Systems developer  . Tobacco comment: stopped 18 years ago  Vaping Use  . Vaping Use: Never used  Substance and Sexual Activity  . Alcohol use: No  . Drug use: No  . Sexual activity: Not on file  Other Topics Concern  . Not on file  Social History Narrative  . Not on file   Social Determinants of Health   Financial Resource Strain: Low Risk   . Difficulty of Paying Living Expenses: Not hard at all  Food Insecurity: No Food Insecurity  . Worried About Charity fundraiser in the Last Year: Never true  . Ran Out of Food in the Last Year: Never true  Transportation Needs: No Transportation Needs  . Lack of Transportation (Medical): No  . Lack of Transportation (Non-Medical): No  Physical Activity: Inactive  . Days of Exercise per Week: 0 days  . Minutes of Exercise per Session: 0 min  Stress: No Stress Concern Present  . Feeling of Stress : Only a little  Social Connections:   . Frequency of Communication with Friends and Family:   . Frequency of Social Gatherings with Friends and Family:   . Attends Religious Services:   . Active Member of Clubs or Organizations:   . Attends Archivist Meetings:   [name] Marital Status:    Intimate Partner Violence:   . Fear of Current or Ex-Partner:   . Emotionally Abused:   [name] Physically Abused:   . Sexually Abused:     Review of Systems: See HPI, otherwise negative ROS  Physical Exam: Pulse 76   Temp (!) 97.4 F (36.3 C) (Temporal)   Resp 18   Ht 5' 3"  (1.6 m)   Wt 90.3 kg   SpO2 98%   BMI 35.25 kg/m  General:   Alert,  pleasant and cooperative in NAD Head:  Normocephalic and atraumatic. Neck:  Supple; no masses or thyromegaly. Lungs:  Clear throughout to auscultation.    Heart:  Regular rate and rhythm. Abdomen:  Soft, nontender and nondistended. Normal bowel sounds, without guarding, and without rebound.   Neurologic:  Alert and  oriented x4;  grossly normal neurologically.  Impression/Plan: Alyssa Perkins is here for an endoscopy and colonoscopy to be performed for chronic diarrhea, weight loss, elevated fecal calprotectin  Risks, benefits, limitations, and alternatives regarding  endoscopy and colonoscopy have been reviewed with the patient.  Questions have been answered.  All parties agreeable.   Sherri Sear, MD  09/29/2019, 10:14 AM

## 2019-09-29 NOTE — Anesthesia Postprocedure Evaluation (Signed)
Anesthesia Post Note  Patient: Alyssa Perkins  Procedure(s) Performed: COLONOSCOPY WITH PROPOFOL (N/A ) ESOPHAGOGASTRODUODENOSCOPY (EGD) WITH PROPOFOL (N/A )  Patient location during evaluation: Endoscopy Anesthesia Type: General Level of consciousness: awake and alert Pain management: pain level controlled Vital Signs Assessment: post-procedure vital signs reviewed and stable Respiratory status: spontaneous breathing and respiratory function stable Cardiovascular status: stable Anesthetic complications: no   No complications documented.   Last Vitals:  Vitals:   09/29/19 1000 09/29/19 1108  Pulse: 76   Resp: 18   Temp: (!) 36.3 C (!) 36.2 C  SpO2: 98%     Last Pain:  Vitals:   09/29/19 1108  TempSrc: Temporal  PainSc:                  Ciji Boston K

## 2019-09-29 NOTE — Anesthesia Preprocedure Evaluation (Signed)
Anesthesia Evaluation  Patient identified by MRN, date of birth, ID band Patient awake    Reviewed: Allergy & Precautions, NPO status , Patient's Chart, lab work & pertinent test results  History of Anesthesia Complications (+) PONV and history of anesthetic complications  Airway Mallampati: III       Dental  (+) Edentulous Upper, Edentulous Lower   Pulmonary asthma , sleep apnea (not using CPAP) , neg COPD, Not current smoker, former smoker,           Cardiovascular hypertension, Pt. on medications (-) Past MI and (-) CHF (-) dysrhythmias (-) Valvular Problems/Murmurs     Neuro/Psych neg Seizures Anxiety Depression    GI/Hepatic Neg liver ROS, GERD  Medicated and Controlled,  Endo/Other  diabetes, Type 2, Oral Hypoglycemic Agents, Insulin Dependent  Renal/GU negative Renal ROS     Musculoskeletal   Abdominal   Peds  Hematology   Anesthesia Other Findings   Reproductive/Obstetrics                            Anesthesia Physical Anesthesia Plan  ASA: III  Anesthesia Plan: General   Post-op Pain Management:    Induction: Intravenous  PONV Risk Score and Plan: 4 or greater and Propofol infusion, TIVA and Treatment may vary due to age or medical condition  Airway Management Planned: Nasal Cannula  Additional Equipment:   Intra-op Plan:   Post-operative Plan:   Informed Consent: I have reviewed the patients History and Physical, chart, labs and discussed the procedure including the risks, benefits and alternatives for the proposed anesthesia with the patient or authorized representative who has indicated his/her understanding and acceptance.       Plan Discussed with:   Anesthesia Plan Comments:         Anesthesia Quick Evaluation

## 2019-09-29 NOTE — Transfer of Care (Signed)
Immediate Anesthesia Transfer of Care Note  Patient: [name]  Procedure(s) Performed: COLONOSCOPY WITH PROPOFOL (N/A ) ESOPHAGOGASTRODUODENOSCOPY (EGD) WITH PROPOFOL (N/A )  Patient Location: Endoscopy Unit  Anesthesia Type:General  Level of Consciousness: drowsy and patient cooperative  Airway & Oxygen Therapy: Patient Spontanous Breathing and Patient connected to face mask oxygen  Post-op Assessment: Report given to RN and Post -op Vital signs reviewed and stable  Post vital signs: Reviewed and stable  Last Vitals:  Vitals Value Taken Time  BP 129/77 09/29/19 1107  Temp 36.2 C 09/29/19 1108  Pulse 80 09/29/19 1108  Resp 14 09/29/19 1108  SpO2 98 % 09/29/19 1108  Vitals shown include unvalidated device data.  Last Pain:  Vitals:   09/29/19 1108  TempSrc: Temporal  PainSc:          Complications: No complications documented.

## 2019-09-29 NOTE — Op Note (Signed)
[facility] Gastroenterology Patient Name: Alyssa Perkins Procedure Date: 09/29/2019 10:21 AM MRN: [id number] Account #: [id number] Date of Birth: 1950/11/01 Admit Type: Outpatient Age: 69 Room: [facility] ENDO ROOM 4 Gender: Female Note Status: Finalized Procedure:             Colonoscopy Indications:           Clinically significant diarrhea of unexplained origin,                         Weight loss, elevated fecal calprotectin levels Providers:             Lin Landsman MD, MD Referring MD:          [name] (Referring MD) Medicines:             Monitored Anesthesia Care Complications:         No immediate complications. Estimated blood loss: None. Procedure:             Pre-Anesthesia Assessment:                        - Prior to the procedure, a History and Physical was                         performed, and patient medications and allergies were                         reviewed. The patient is competent. The risks and                         benefits of the procedure and the sedation options and                         risks were discussed with the patient. All questions                         were answered and informed consent was obtained.                         Patient identification and proposed procedure were                         verified by the physician, the nurse, the                         anesthesiologist, the anesthetist and the technician                         in the pre-procedure area in the procedure room in the                         endoscopy suite. Mental Status Examination: alert and                         oriented. Airway Examination: normal oropharyngeal                         airway and neck mobility. Respiratory Examination:  clear to auscultation. CV Examination: normal.                         Prophylactic Antibiotics: The patient does not require                         prophylactic  antibiotics. Prior Anticoagulants: The                         patient has taken no previous anticoagulant or                         antiplatelet agents. ASA Grade Assessment: III - A                         patient with severe systemic disease. After reviewing                         the risks and benefits, the patient was deemed in                         satisfactory condition to undergo the procedure. The                         anesthesia plan was to use monitored anesthesia care                         (MAC). Immediately prior to administration of                         medications, the patient was re-assessed for adequacy                         to receive sedatives. The heart rate, respiratory                         rate, oxygen saturations, blood pressure, adequacy of                         pulmonary ventilation, and response to care were                         monitored throughout the procedure. The physical                         status of the patient was re-assessed after the                         procedure.                        After obtaining informed consent, the colonoscope was                         passed under direct vision. Throughout the procedure,                         the patient's blood pressure, pulse, and oxygen  saturations were monitored continuously. The                         Colonoscope was introduced through the anus and                         advanced to the the cecum, identified by appendiceal                         orifice and ileocecal valve. The colonoscopy was                         extremely difficult due to poor bowel prep with stool                         present and significant looping. Successful completion                         of the procedure was aided by applying abdominal                         pressure and lavage. The patient tolerated the                         procedure well. The quality of the  bowel preparation                         was poor. Findings:      The perianal and digital rectal examinations were normal. Pertinent       negatives include normal sphincter tone and no palpable rectal lesions.      Normal mucosa was found in the entire colon. Biopsies were taken with a       cold forceps for histology.      A few sessile polyps were found in the transverse colon and ascending       colon. The polyps were small in size. Polypectomy was not attempted due       to inadequate bowel preparation.      The retroflexed view of the distal rectum and anal verge was normal and       showed no anal or rectal abnormalities. Impression:            - Preparation of the colon was poor.                        - Normal mucosa in the entire examined colon. Biopsied.                        - A few small polyps in the transverse colon and in                         the ascending colon. Resection not attempted.                        - The distal rectum and anal verge are normal on                         retroflexion view. Recommendation:        -  Discharge patient to home (with escort).                        - Clear liquid diet today if patient agreeable for                         colonoscopy tomorrow.                        - Repeat colonoscopy tomorrow because the bowel                         preparation was suboptimal, repeat bowel prep today if                         patient agreeable. Otherwise recommend repeat                         colonoscopy with 2 day prep in 3-49month. Procedure Code(s):     --- Professional ---                        [id number] Colonoscopy, flexible; with biopsy, single or                         multiple Diagnosis Code(s):     --- Professional ---                        K63.5, Polyp of colon                        R19.7, Diarrhea, unspecified                        R63.4, Abnormal weight loss CPT copyright 2019 American Medical Association. All rights  reserved. The codes documented in this report are preliminary and upon coder review may  be revised to meet current compliance requirements. Dr. RUlyess MortRLin LandsmanMD, MD 09/29/2019 11:13:34 AM This report has been signed electronically. Number of Addenda: 0 Note Initiated On: 09/29/2019 10:21 AM Scope Withdrawal Time: 0 hours 12 minutes 32 seconds  Total Procedure Duration: 0 hours 18 minutes 2 seconds  Estimated Blood Loss:  Estimated blood loss: none.      [facility]

## 2019-09-29 NOTE — Op Note (Signed)
[facility] Gastroenterology Patient Name: Alyssa Perkins Procedure Date: 09/29/2019 10:22 AM MRN: [id number] Account #: [id number] Date of Birth: 04-10-1950 Admit Type: Outpatient Age: 69 Room: [facility] ENDO ROOM 4 Gender: Female Note Status: Finalized Procedure:             Upper GI endoscopy Indications:           Diarrhea, Weight loss Providers:             Lin Landsman MD, MD Referring MD:          [name] (Referring MD) Medicines:             Monitored Anesthesia Care Complications:         No immediate complications. Estimated blood loss: None. Procedure:             Pre-Anesthesia Assessment:                        - Prior to the procedure, a History and Physical was                         performed, and patient medications and allergies were                         reviewed. The patient is competent. The risks and                         benefits of the procedure and the sedation options and                         risks were discussed with the patient. All questions                         were answered and informed consent was obtained.                         Patient identification and proposed procedure were                         verified by the physician, the nurse, the                         anesthesiologist, the anesthetist and the technician                         in the pre-procedure area in the procedure room in the                         endoscopy suite. Mental Status Examination: alert and                         oriented. Airway Examination: normal oropharyngeal                         airway and neck mobility. Respiratory Examination:                         clear to auscultation. CV Examination: normal.  Prophylactic Antibiotics: The patient does not require                         prophylactic antibiotics. Prior Anticoagulants: The                         patient has taken no previous  anticoagulant or                         antiplatelet agents. ASA Grade Assessment: III - A                         patient with severe systemic disease. After reviewing                         the risks and benefits, the patient was deemed in                         satisfactory condition to undergo the procedure. The                         anesthesia plan was to use monitored anesthesia care                         (MAC). Immediately prior to administration of                         medications, the patient was re-assessed for adequacy                         to receive sedatives. The heart rate, respiratory                         rate, oxygen saturations, blood pressure, adequacy of                         pulmonary ventilation, and response to care were                         monitored throughout the procedure. The physical                         status of the patient was re-assessed after the                         procedure.                        After obtaining informed consent, the endoscope was                         passed under direct vision. Throughout the procedure,                         the patient's blood pressure, pulse, and oxygen                         saturations were monitored continuously. The Endoscope  was introduced through the mouth, and advanced to the                         second part of duodenum. The upper GI endoscopy was                         accomplished without difficulty. The patient tolerated                         the procedure well. Findings:      The esophagus was normal.      The stomach was normal.      The examined duodenum was normal. Impression:            - Normal esophagus.                        - Normal stomach.                        - Normal examined duodenum.                        - No specimens collected. Recommendation:        - Proceed with colonoscopy as scheduled                        See  colonoscopy report Procedure Code(s):     --- Professional ---                        [phone], Esophagogastroduodenoscopy, flexible,                         transoral; diagnostic, including collection of                         specimen(s) by brushing or washing, when performed                         (separate procedure) Diagnosis Code(s):     --- Professional ---                        R19.7, Diarrhea, unspecified                        R63.4, Abnormal weight loss CPT copyright 2019 American Medical Association. All rights reserved. The codes documented in this report are preliminary and upon coder review may  be revised to meet current compliance requirements. Dr. Ulyess Mort Lin Landsman MD, MD 09/29/2019 10:40:29 AM This report has been signed electronically. Number of Addenda: 0 Note Initiated On: 09/29/2019 10:22 AM Estimated Blood Loss:  Estimated blood loss: none.      [facility]

## 2019-09-30 ENCOUNTER — Encounter
Admission: RE | Disposition: A | Discharge status: Home / Self Care | Payer: Self-pay | Source: Home / Self Care | Attending: Gastroenterology

## 2019-09-30 ENCOUNTER — Ambulatory Visit
Admission: RE | Admit: 2019-09-30 | Discharge: 2019-09-30 | Disposition: A | Discharge status: Home / Self Care | Payer: Medicare Other | Attending: Gastroenterology | Admitting: Gastroenterology

## 2019-09-30 ENCOUNTER — Ambulatory Visit: Payer: Medicare Other | Admitting: Anesthesiology

## 2019-09-30 ENCOUNTER — Telehealth: Payer: Self-pay

## 2019-09-30 ENCOUNTER — Encounter: Payer: Self-pay | Admitting: Gastroenterology

## 2019-09-30 DIAGNOSIS — J45909 Unspecified asthma, uncomplicated: Secondary | ICD-10-CM | POA: Insufficient documentation

## 2019-09-30 DIAGNOSIS — Z791 Long term (current) use of non-steroidal anti-inflammatories (NSAID): Secondary | ICD-10-CM | POA: Insufficient documentation

## 2019-09-30 DIAGNOSIS — K5730 Diverticulosis of large intestine without perforation or abscess without bleeding: Secondary | ICD-10-CM | POA: Insufficient documentation

## 2019-09-30 DIAGNOSIS — I10 Essential (primary) hypertension: Secondary | ICD-10-CM | POA: Diagnosis not present

## 2019-09-30 DIAGNOSIS — D123 Benign neoplasm of transverse colon: Secondary | ICD-10-CM | POA: Diagnosis not present

## 2019-09-30 DIAGNOSIS — E119 Type 2 diabetes mellitus without complications: Secondary | ICD-10-CM | POA: Diagnosis not present

## 2019-09-30 DIAGNOSIS — G4730 Sleep apnea, unspecified: Secondary | ICD-10-CM | POA: Insufficient documentation

## 2019-09-30 DIAGNOSIS — E785 Hyperlipidemia, unspecified: Secondary | ICD-10-CM | POA: Insufficient documentation

## 2019-09-30 DIAGNOSIS — D122 Benign neoplasm of ascending colon: Secondary | ICD-10-CM | POA: Insufficient documentation

## 2019-09-30 DIAGNOSIS — G2581 Restless legs syndrome: Secondary | ICD-10-CM | POA: Insufficient documentation

## 2019-09-30 DIAGNOSIS — F329 Major depressive disorder, single episode, unspecified: Secondary | ICD-10-CM | POA: Diagnosis not present

## 2019-09-30 DIAGNOSIS — E079 Disorder of thyroid, unspecified: Secondary | ICD-10-CM | POA: Diagnosis not present

## 2019-09-30 DIAGNOSIS — G43909 Migraine, unspecified, not intractable, without status migrainosus: Secondary | ICD-10-CM | POA: Diagnosis not present

## 2019-09-30 DIAGNOSIS — K589 Irritable bowel syndrome without diarrhea: Secondary | ICD-10-CM | POA: Diagnosis not present

## 2019-09-30 DIAGNOSIS — Z882 Allergy status to sulfonamides status: Secondary | ICD-10-CM | POA: Insufficient documentation

## 2019-09-30 DIAGNOSIS — F419 Anxiety disorder, unspecified: Secondary | ICD-10-CM | POA: Diagnosis not present

## 2019-09-30 DIAGNOSIS — Z90710 Acquired absence of both cervix and uterus: Secondary | ICD-10-CM | POA: Diagnosis not present

## 2019-09-30 DIAGNOSIS — F418 Other specified anxiety disorders: Secondary | ICD-10-CM | POA: Diagnosis not present

## 2019-09-30 DIAGNOSIS — K5790 Diverticulosis of intestine, part unspecified, without perforation or abscess without bleeding: Secondary | ICD-10-CM | POA: Diagnosis not present

## 2019-09-30 DIAGNOSIS — K644 Residual hemorrhoidal skin tags: Secondary | ICD-10-CM | POA: Insufficient documentation

## 2019-09-30 DIAGNOSIS — K219 Gastro-esophageal reflux disease without esophagitis: Secondary | ICD-10-CM | POA: Diagnosis not present

## 2019-09-30 DIAGNOSIS — Z87891 Personal history of nicotine dependence: Secondary | ICD-10-CM | POA: Insufficient documentation

## 2019-09-30 DIAGNOSIS — Z981 Arthrodesis status: Secondary | ICD-10-CM | POA: Insufficient documentation

## 2019-09-30 DIAGNOSIS — Z803 Family history of malignant neoplasm of breast: Secondary | ICD-10-CM | POA: Insufficient documentation

## 2019-09-30 DIAGNOSIS — M549 Dorsalgia, unspecified: Secondary | ICD-10-CM | POA: Insufficient documentation

## 2019-09-30 DIAGNOSIS — Z8041 Family history of malignant neoplasm of ovary: Secondary | ICD-10-CM | POA: Insufficient documentation

## 2019-09-30 DIAGNOSIS — Z794 Long term (current) use of insulin: Secondary | ICD-10-CM | POA: Diagnosis not present

## 2019-09-30 DIAGNOSIS — F429 Obsessive-compulsive disorder, unspecified: Secondary | ICD-10-CM | POA: Insufficient documentation

## 2019-09-30 DIAGNOSIS — M1990 Unspecified osteoarthritis, unspecified site: Secondary | ICD-10-CM | POA: Insufficient documentation

## 2019-09-30 DIAGNOSIS — Z8262 Family history of osteoporosis: Secondary | ICD-10-CM | POA: Insufficient documentation

## 2019-09-30 DIAGNOSIS — G8929 Other chronic pain: Secondary | ICD-10-CM | POA: Insufficient documentation

## 2019-09-30 DIAGNOSIS — Z833 Family history of diabetes mellitus: Secondary | ICD-10-CM | POA: Insufficient documentation

## 2019-09-30 DIAGNOSIS — R569 Unspecified convulsions: Secondary | ICD-10-CM | POA: Insufficient documentation

## 2019-09-30 DIAGNOSIS — Z808 Family history of malignant neoplasm of other organs or systems: Secondary | ICD-10-CM | POA: Insufficient documentation

## 2019-09-30 DIAGNOSIS — Z89421 Acquired absence of other right toe(s): Secondary | ICD-10-CM | POA: Insufficient documentation

## 2019-09-30 DIAGNOSIS — Z79899 Other long term (current) drug therapy: Secondary | ICD-10-CM | POA: Insufficient documentation

## 2019-09-30 DIAGNOSIS — K529 Noninfective gastroenteritis and colitis, unspecified: Secondary | ICD-10-CM | POA: Diagnosis not present

## 2019-09-30 DIAGNOSIS — K635 Polyp of colon: Secondary | ICD-10-CM | POA: Diagnosis not present

## 2019-09-30 DIAGNOSIS — Z1211 Encounter for screening for malignant neoplasm of colon: Secondary | ICD-10-CM | POA: Diagnosis not present

## 2019-09-30 DIAGNOSIS — M542 Cervicalgia: Secondary | ICD-10-CM | POA: Insufficient documentation

## 2019-09-30 DIAGNOSIS — Z8249 Family history of ischemic heart disease and other diseases of the circulatory system: Secondary | ICD-10-CM | POA: Insufficient documentation

## 2019-09-30 DIAGNOSIS — J449 Chronic obstructive pulmonary disease, unspecified: Secondary | ICD-10-CM | POA: Diagnosis not present

## 2019-09-30 DIAGNOSIS — Z9049 Acquired absence of other specified parts of digestive tract: Secondary | ICD-10-CM | POA: Insufficient documentation

## 2019-09-30 DIAGNOSIS — Z90721 Acquired absence of ovaries, unilateral: Secondary | ICD-10-CM | POA: Insufficient documentation

## 2019-09-30 DIAGNOSIS — K649 Unspecified hemorrhoids: Secondary | ICD-10-CM | POA: Diagnosis not present

## 2019-09-30 HISTORY — PX: COLONOSCOPY WITH PROPOFOL: SHX5780

## 2019-09-30 LAB — GLUCOSE, CAPILLARY: Glucose-Capillary: 126 mg/dL — ABNORMAL HIGH (ref 70–99)

## 2019-09-30 LAB — SURGICAL PATHOLOGY

## 2019-09-30 SURGERY — COLONOSCOPY WITH PROPOFOL
Anesthesia: General

## 2019-09-30 MED ORDER — SODIUM CHLORIDE 0.9 % IV SOLN
INTRAVENOUS | Status: AC | PRN
  Administered 2019-09-30: 8 mL via INTRAMUSCULAR

## 2019-09-30 MED ORDER — PROPOFOL 10 MG/ML IV BOLUS
INTRAVENOUS | Status: DC | PRN
  Administered 2019-09-30: 75 ug/kg/min via INTRAVENOUS

## 2019-09-30 MED ORDER — PROPOFOL 500 MG/50ML IV EMUL
INTRAVENOUS | Status: AC
  Filled 2019-09-30: qty 50

## 2019-09-30 MED ORDER — SODIUM CHLORIDE 0.9 % IV SOLN
INTRAVENOUS | Status: DC
  Administered 2019-09-30: 1000 mL via INTRAVENOUS

## 2019-09-30 MED ORDER — MESALAMINE 1.2 G PO TBEC: 1.2000 g | DELAYED_RELEASE_TABLET | Freq: Two times a day (BID) | ORAL | 0 refills | Status: AC

## 2019-09-30 MED ORDER — LIDOCAINE HCL (CARDIAC) PF 100 MG/5ML IV SOSY
PREFILLED_SYRINGE | INTRAVENOUS | Status: DC | PRN
  Administered 2019-09-30: 50 mg via INTRAVENOUS

## 2019-09-30 NOTE — Transfer of Care (Signed)
Immediate Anesthesia Transfer of Care Note  Patient: Alyssa Perkins  Procedure(s) Performed: COLONOSCOPY WITH PROPOFOL (N/A )  Patient Location: PACU and Endoscopy Unit  Anesthesia Type:General  Level of Consciousness: drowsy  Airway & Oxygen Therapy: Patient Spontanous Breathing  Post-op Assessment: Report given to RN and Post -op Vital signs reviewed and stable  Post vital signs: Reviewed and stable  Last Vitals:  Vitals Value Taken Time  BP 143/81 09/30/19 1200  Temp 36.1 C 09/30/19 1200  Pulse 82 09/30/19 1202  Resp 18 09/30/19 1202  SpO2 99 % 09/30/19 1202  Vitals shown include unvalidated device data.  Last Pain:  Vitals:   09/30/19 1200  TempSrc: Temporal  PainSc: Asleep         Complications: No complications documented.

## 2019-09-30 NOTE — Telephone Encounter (Signed)
-----   Message from Lin Landsman, MD sent at 09/30/2019  4:24 PM EDT ----- Pathology results from colonoscopy are unremarkableRecommend trial of Lialda 2.4 g twice daily or Apriso 0.75 g twice daily based on her insuranceRohini Vanga

## 2019-09-30 NOTE — H&P (Signed)
Alyssa Darby, MD [address]  [place]  [place],  [address]  Main: [phone]  Fax: [phone] Pager: [phone]  Primary Care Physician:  [name], DO Primary Gastroenterologist:  Dr. Cephas Perkins  Pre-Procedure History & Physical: HPI:  Alyssa Perkins is a 69 y.o. female is here for an colonoscopy.   Past Medical History:  Diagnosis Date   Anginal pain ([place])    Anxiety    not on any medication at this time   Arthritis    Asthma    "sports asthma or exercised induced"seen at [facility] for primary   Chronic back pain    Chronic neck pain    Complication of anesthesia    "difficulty waking up from surgery"   Depression    has OCD, states counts constantly   GERD (gastroesophageal reflux disease)    Headache(784.0)    hx of migraines   Hyperlipidemia    IBS (irritable bowel syndrome)    PONV (postoperative nausea and vomiting)    RLS (restless legs syndrome)    Sleep apnea    Thyroid disease     Past Surgical History:  Procedure Laterality Date   ABDOMINAL HYSTERECTOMY     also had oophorectomy   AMPUTATION TOE Right 09/05/2017   Procedure: AMPUTATION TOE-RIGHT 2ND TOE;  Surgeon: Sharlotte Alamo, DPM;  Location: [facility] ORS;  Service: Podiatry;  Laterality: Right;   ANTERIOR CERVICAL DECOMP/DISCECTOMY FUSION  01/02/2012   Procedure: ANTERIOR CERVICAL DECOMPRESSION/DISCECTOMY FUSION 2 LEVELS;  Surgeon: Melina Schools, MD;  Location: [facility];  Service: Orthopedics;  Laterality: N/A;  ACDF C5-7   BILATERAL CARPAL TUNNEL RELEASE     CARDIAC CATHETERIZATION     done approx. 4- 5 years ago, Dr. Leanor Kail, does not see cardi. at this time.   CHOLECYSTECTOMY     COLONOSCOPY WITH PROPOFOL N/A 09/29/2019   Procedure: COLONOSCOPY WITH PROPOFOL;  Surgeon: Lin Landsman, MD;  Location: [facility] ENDOSCOPY;  Service: Gastroenterology;  Laterality: N/A;   DILATION AND CURETTAGE OF UTERUS     "several"    ESOPHAGOGASTRODUODENOSCOPY (EGD) WITH PROPOFOL N/A 09/29/2019   Procedure: ESOPHAGOGASTRODUODENOSCOPY (EGD) WITH PROPOFOL;  Surgeon: Lin Landsman, MD;  Location: [facility];  Service: Gastroenterology;  Laterality: N/A;   HERNIA REPAIR     IRRIGATION AND DEBRIDEMENT FOOT  09/05/2017   Procedure: IRRIGATION AND DEBRIDEMENT FOOT;  Surgeon: Sharlotte Alamo, DPM;  Location: [facility] ORS;  Service: Podiatry;;   KNEE SURGERY     1 partial left, 1 total left   SPINE SURGERY      Prior to Admission medications   Medication Sig Start Date End Date Taking? Authorizing Provider  Blood Glucose Monitoring Suppl (ONETOUCH VERIO) w/Device KIT Use to check blood sugar up to 3 x daily 08/30/19   Karamalegos, Devonne Doughty, DO  cyclobenzaprine (FLEXERIL) 5 MG tablet Take 1-2 tablets (5-10 mg total) by mouth 3 (three) times daily as needed for muscle spasms. 05/15/19   Duanne Guess, PA-C  diclofenac sodium (VOLTAREN) 1 % GEL Apply 2 g topically 3 (three) times daily as needed. 11/13/17   Karamalegos, Devonne Doughty, DO  dicyclomine (BENTYL) 10 MG capsule Take 1 capsule (10 mg total) by mouth 4 (four) times daily -  before meals and at bedtime. Patient not taking: Reported on 09/29/2019 08/31/19   Lin Landsman, MD  Elastic Bandages & Supports (MEDICAL COMPRESSION STOCKINGS) [place] 1 each by Does not apply route daily. 11/14/15   Krebs,  Amy Lauren, NP  empagliflozin (JARDIANCE) 25 MG TABS tablet Take 25 mg by mouth daily with breakfast. 02/08/19 02/08/20  [provider]  famotidine (PEPCID) 20 MG tablet Take 1 tablet (20 mg total) by mouth daily. Patient not taking: Reported on 09/29/2019 01/26/19 01/26/20  Cuthriell, Charline Bills, PA-C  gabapentin (NEURONTIN) 100 MG capsule Take 2 capsules (200 mg total) by mouth in the morning, at noon, and at bedtime. 05/10/19   Karamalegos, Devonne Doughty, DO  hydrOXYzine (ATARAX/VISTARIL) 25 MG tablet Take 1 tablet (25 mg total) by mouth daily as needed for itching. For  itching Patient taking differently: Take 25 mg by mouth at bedtime. For itching 02/17/19   [name], DO  ibuprofen (ADVIL,MOTRIN) 200 MG tablet Take 600-800 mg by mouth every 6 (six) hours as needed for moderate pain.     [provider]  Insulin Glargine, 1 Unit Dial, (TOUJEO SOLOSTAR) 300 UNIT/ML SOPN Inject 70 Units into the skin daily. Patient taking differently: Inject 76 Units into the skin daily.  05/10/19   Karamalegos, Devonne Doughty, DO  Insulin Pen Needle 31G X 5 MM MISC 1 each by Does not apply route daily. Use to inject Toujeo daily. Dx:E11.9 02/17/19   Karamalegos, Devonne Doughty, DO  omeprazole (PRILOSEC) 20 MG capsule Take 1 capsule (20 mg total) by mouth daily. 03/13/18   Karamalegos, Devonne Doughty, DO  ondansetron (ZOFRAN-ODT) 4 MG disintegrating tablet Take 1 tablet (4 mg total) by mouth every 8 (eight) hours as needed for nausea or vomiting. 01/26/19   Cuthriell, Charline Bills, PA-C  OneTouch Delica Lancets 34L MISC Use to check blood sugar up to 3 x daily 10/29/18   Parks Ranger, Devonne Doughty, DO  [facility] VERIO test strip Check blood sugar 3 x daily 09/08/19   [name], DO  quinapril (ACCUPRIL) 20 MG tablet Take 1 tablet (20 mg total) by mouth at bedtime. Patient not taking: Reported on 09/29/2019 02/17/19   [name], DO  Semaglutide,0.25 or 0.'5MG'$ /DOS, (OZEMPIC, 0.25 OR 0.5 MG/DOSE,) 2 MG/1.5ML SOPN Inject 0.5 mg into the skin once a week.    [provider]  sertraline (ZOLOFT) 50 MG tablet Take 1 tablet (50 mg total) by mouth daily. 02/17/19   Karamalegos, Devonne Doughty, DO  simvastatin (ZOCOR) 40 MG tablet Take 1 tablet (40 mg total) by mouth at bedtime. Reported on 05/16/2015 02/17/19   [name], DO  traMADol (ULTRAM) 50 MG tablet Take 1 tablet (50 mg total) by mouth every 6 (six) hours as needed. 05/15/19 05/14/20  Duanne Guess, PA-C  traZODone (DESYREL) 50 MG tablet Take 1 tablet (50 mg total) by mouth at bedtime.  02/17/19   Karamalegos, Devonne Doughty, DO  ranitidine (ZANTAC) 300 MG tablet Take 1 tablet (300 mg total) by mouth daily. 01/26/19 01/26/19  Cuthriell, Roderic Palau D, PA-C    Allergies as of 09/29/2019 - Review Complete 09/29/2019  Allergen Reaction Noted   Bactrim [sulfamethoxazole-trimethoprim] Rash 08/31/2019    Family History  Problem Relation Age of Onset   Heart disease Mother    Osteoporosis Mother    Breast cancer Mother        dx with ovarian ca 1st   Ovarian cancer Mother        3's   Osteoporosis Sister    Diabetes Sister    Breast cancer Sister        early 64's no braca testing    Cancer Brother        sinus,  bone   Osteoporosis Brother     Social History   Socioeconomic History   Marital status: Widowed    Spouse name: Not on file   Number of children: Not on file   Years of education: 12   Highest education level: 12th grade  Occupational History   Not on file  Tobacco Use   Smoking status: Former Smoker    Packs/day: 0.50    Years: 15.00    Pack years: 7.50    Types: Cigarettes    Quit date: 07/17/1994    Years since quitting: 25.2   Smokeless tobacco: Former Systems developer   Tobacco comment: stopped 18 years ago  Vaping Use   Vaping Use: Never used  Substance and Sexual Activity   Alcohol use: No   Drug use: No   Sexual activity: Not on file  Other Topics Concern   Not on file  Social History Narrative   Not on file   Social Determinants of Health   Financial Resource Strain: Low Risk    Difficulty of Paying Living Expenses: Not hard at all  Food Insecurity: No Food Insecurity   Worried About Charity fundraiser in the Last Year: Never true   [place] in the Last Year: Never true  Transportation Needs: No Transportation Needs   Lack of Transportation (Medical): No   Lack of Transportation (Non-Medical): No  Physical Activity: Inactive   Days of Exercise per Week: 0 days   Minutes of Exercise per Session: 0 min  Stress: No Stress Concern  Present   Feeling of Stress : Only a little  Social Connections:    Frequency of Communication with Friends and Family:    Frequency of Social Gatherings with Friends and Family:    Attends Religious Services:    Active Member of Clubs or Organizations:    Attends Music therapist:    Marital Status:   Intimate Partner Violence:    Fear of Current or Ex-Partner:    Emotionally Abused:    Physically Abused:    Sexually Abused:     Review of Systems: See HPI, otherwise negative ROS  Physical Exam: BP (!) 135/100   Pulse 83   Temp (!) 97 F (36.1 C) (Tympanic)   Resp 18   Ht 5' 4"  (1.626 m)   Wt 199 lb (90.3 kg)   SpO2 100%   BMI 34.16 kg/m  General:   Alert,  pleasant and cooperative in NAD Head:  Normocephalic and atraumatic. Neck:  Supple; no masses or thyromegaly. Lungs:  Clear throughout to auscultation.    Heart:  Regular rate and rhythm. Abdomen:  Soft, nontender and nondistended. Normal bowel sounds, without guarding, and without rebound.   Neurologic:  Alert and  oriented x4;  grossly normal neurologically.  Impression/Plan: [name] is here for an colonoscopy to be performed for colon cancer screening  Risks, benefits, limitations, and alternatives regarding  colonoscopy have been reviewed with the patient.  Questions have been answered.  All parties agreeable.   Sherri Sear, MD  09/30/2019, 11:34 AM

## 2019-09-30 NOTE — Op Note (Signed)
[facility] Gastroenterology Patient Name: Alyssa Perkins Procedure Date: 09/30/2019 11:30 AM MRN: [id number] Account #: [id number] Date of Birth: 08-07-1950 Admit Type: Outpatient Age: 69 Room: [facility] ENDO ROOM 4 Gender: Female Note Status: Finalized Procedure:             Colonoscopy Indications:           Screening for colorectal malignant neoplasm Providers:             Lin Landsman MD, MD Referring MD:          [name] (Referring MD) Medicines:             Monitored Anesthesia Care Complications:         No immediate complications. Estimated blood loss: None. Procedure:             Pre-Anesthesia Assessment:                        - Prior to the procedure, a History and Physical was                         performed, and patient medications and allergies were                         reviewed. The patient is competent. The risks and                         benefits of the procedure and the sedation options and                         risks were discussed with the patient. All questions                         were answered and informed consent was obtained.                         Patient identification and proposed procedure were                         verified by the physician, the nurse, the                         anesthesiologist, the anesthetist and the technician                         in the pre-procedure area in the procedure room in the                         endoscopy suite. Mental Status Examination: alert and                         oriented. Airway Examination: normal oropharyngeal                         airway and neck mobility. Respiratory Examination:                         clear to auscultation. CV Examination: normal.  Prophylactic Antibiotics: The patient does not require                         prophylactic antibiotics. Prior Anticoagulants: The                         patient has taken no  previous anticoagulant or                         antiplatelet agents. ASA Grade Assessment: III - A                         patient with severe systemic disease. After reviewing                         the risks and benefits, the patient was deemed in                         satisfactory condition to undergo the procedure. The                         anesthesia plan was to use monitored anesthesia care                         (MAC). Immediately prior to administration of                         medications, the patient was re-assessed for adequacy                         to receive sedatives. The heart rate, respiratory                         rate, oxygen saturations, blood pressure, adequacy of                         pulmonary ventilation, and response to care were                         monitored throughout the procedure. The physical                         status of the patient was re-assessed after the                         procedure.                        After obtaining informed consent, the colonoscope was                         passed under direct vision. Throughout the procedure,                         the patient's blood pressure, pulse, and oxygen                         saturations were monitored continuously. The  Colonoscope was introduced through the anus and                         advanced to the the cecum, identified by appendiceal                         orifice and ileocecal valve. The colonoscopy was                         performed without difficulty. The patient tolerated                         the procedure well. The quality of the bowel                         preparation was evaluated using the BBPS [facility] Bowel                         Preparation Scale) with scores of: Right Colon = 3,                         Transverse Colon = 3 and Left Colon = 3 (entire mucosa                         seen well with no residual staining, small  fragments                         of stool or opaque liquid). The total BBPS score                         equals 9. Findings:      The perianal and digital rectal examinations were normal. Pertinent       negatives include normal sphincter tone and no palpable rectal lesions.      A 12 mm polyp was found in the ascending colon. The polyp was sessile.       Preparations were made for mucosal resection. NBI was used to mark the       borders of the lesion. Saline was injected to raise the lesion. Snare       mucosal resection was performed. Resection and retrieval were complete.       To prevent bleeding after mucosal resection, two hemostatic clips were       successfully placed (MR conditional). There was no bleeding during, or       at the end, of the procedure.      A 4 mm polyp was found in the transverse colon. The polyp was sessile.       The polyp was removed with a cold snare. Resection and retrieval were       complete.      A few diverticula were found in the sigmoid colon.      Non-bleeding external hemorrhoids were found during retroflexion. The       hemorrhoids were small. Impression:            - One 12 mm polyp in the ascending colon, removed with                         mucosal resection. Resected and  retrieved. Clips (MR                         conditional) were placed.                        - One 4 mm polyp in the transverse colon, removed with                         a cold snare. Resected and retrieved.                        - Diverticulosis in the sigmoid colon.                        - Non-bleeding external hemorrhoids.                        - Mucosal resection was performed. Resection and                         retrieval were complete. Recommendation:        - Discharge patient to home (with escort).                        - Resume previous diet today.                        - Continue present medications.                        - Await pathology results.                         - Repeat colonoscopy in 3 - 5 years for surveillance                         based on pathology results. Procedure Code(s):     --- Professional ---                        [phone], Colonoscopy, flexible; with endoscopic mucosal                         resection                        45385, 3, Colonoscopy, flexible; with removal of                         tumor(s), polyp(s), or other lesion(s) by snare                         technique Diagnosis Code(s):     --- Professional ---                        Z12.11, Encounter for screening for malignant neoplasm                         of colon  K63.5, Polyp of colon                        K64.4, Residual hemorrhoidal skin tags                        K57.30, Diverticulosis of large intestine without                         perforation or abscess without bleeding CPT copyright 2019 American Medical Association. All rights reserved. The codes documented in this report are preliminary and upon coder review may  be revised to meet current compliance requirements. Dr. Ulyess Mort Lin Landsman MD, MD 09/30/2019 12:04:48 PM This report has been signed electronically. Number of Addenda: 0 Note Initiated On: 09/30/2019 11:30 AM Scope Withdrawal Time: 0 hours 17 minutes 8 seconds  Total Procedure Duration: 0 hours 19 minutes 56 seconds  Estimated Blood Loss:  Estimated blood loss: none.      [facility]

## 2019-09-30 NOTE — Anesthesia Preprocedure Evaluation (Signed)
Anesthesia Evaluation  Patient identified by MRN, date of birth, ID band Patient awake    Reviewed: Allergy & Precautions, NPO status , Patient's Chart, lab work & pertinent test results  History of Anesthesia Complications (+) PONV and history of anesthetic complications  Airway Mallampati: III       Dental  (+) Edentulous Upper, Edentulous Lower   Pulmonary asthma , sleep apnea (not using CPAP) , neg COPD, Not current smoker, former smoker,           Cardiovascular hypertension, Pt. on medications (-) Past MI and (-) CHF (-) dysrhythmias (-) Valvular Problems/Murmurs     Neuro/Psych neg Seizures Anxiety Depression    GI/Hepatic Neg liver ROS, GERD  Medicated and Controlled,  Endo/Other  diabetes, Type 2, Oral Hypoglycemic Agents, Insulin Dependent  Renal/GU negative Renal ROS     Musculoskeletal   Abdominal   Peds  Hematology   Anesthesia Other Findings Past Medical History: No date: Anginal pain ([place]) No date: Anxiety     Comment:  not on any medication at this time No date: Arthritis No date: Asthma     Comment:  "sports asthma or exercised induced"seen at [facility] for primary No date: Chronic back pain No date: Chronic neck pain No date: Complication of anesthesia     Comment:  "difficulty waking up from surgery" No date: Depression     Comment:  has OCD, states counts constantly No date: GERD (gastroesophageal reflux disease) No date: Headache(784.0)     Comment:  hx of migraines No date: Hyperlipidemia No date: IBS (irritable bowel syndrome) No date: PONV (postoperative nausea and vomiting) No date: RLS (restless legs syndrome) No date: Sleep apnea No date: Thyroid disease   Reproductive/Obstetrics                             Anesthesia Physical  Anesthesia Plan  ASA: III  Anesthesia Plan: General   Post-op Pain Management:     Induction: Intravenous  PONV Risk Score and Plan: 4 or greater and Propofol infusion, TIVA and Treatment may vary due to age or medical condition  Airway Management Planned: Nasal Cannula and Natural Airway  Additional Equipment:   Intra-op Plan:   Post-operative Plan:   Informed Consent: I have reviewed the patients History and Physical, chart, labs and discussed the procedure including the risks, benefits and alternatives for the proposed anesthesia with the patient or authorized representative who has indicated his/her understanding and acceptance.       Plan Discussed with:   Anesthesia Plan Comments:         Anesthesia Quick Evaluation

## 2019-09-30 NOTE — Anesthesia Postprocedure Evaluation (Signed)
Anesthesia Post Note  Patient: Alyssa Perkins  Procedure(s) Performed: COLONOSCOPY WITH PROPOFOL (N/A )  Patient location during evaluation: Endoscopy Anesthesia Type: General Level of consciousness: awake and alert Pain management: pain level controlled Vital Signs Assessment: post-procedure vital signs reviewed and stable Respiratory status: spontaneous breathing, nonlabored ventilation, respiratory function stable and patient connected to nasal cannula oxygen Cardiovascular status: blood pressure returned to baseline and stable Postop Assessment: no apparent nausea or vomiting Anesthetic complications: no   No complications documented.   Last Vitals:  Vitals:   09/30/19 1230 09/30/19 1240  BP: 134/77 (!) 118/97  Pulse: 76 69  Resp: 17 19  Temp:    SpO2: 99% 99%    Last Pain:  Vitals:   09/30/19 1240  TempSrc:   PainSc: 0-No pain                 Martha Clan

## 2019-09-30 NOTE — Telephone Encounter (Signed)
Patient verbalized understanding. She would want medication called in to her mail order pharmacy. Sent medication to pharmacy. Per Dr. Marius Ditch she needs to follow up in 1 month

## 2019-10-01 ENCOUNTER — Telehealth: Payer: Self-pay | Admitting: Family Medicine

## 2019-10-01 ENCOUNTER — Encounter: Payer: Self-pay | Admitting: Gastroenterology

## 2019-10-01 LAB — SURGICAL PATHOLOGY

## 2019-10-01 NOTE — Telephone Encounter (Signed)
Patient called, no answer. Left message for patient to return call to Belal Scallon, RN. Patient will need to be informed of [employer] [place] water issue related to procedure at [facility]. If patient returns call please transfer to Triage RN. 

## 2019-10-02 ENCOUNTER — Encounter: Payer: Self-pay | Admitting: Gastroenterology

## 2019-10-11 ENCOUNTER — Telehealth: Payer: Medicare Other | Admitting: General Practice

## 2019-10-11 ENCOUNTER — Ambulatory Visit: Payer: Self-pay | Admitting: General Practice

## 2019-10-11 DIAGNOSIS — I10 Essential (primary) hypertension: Secondary | ICD-10-CM | POA: Diagnosis not present

## 2019-10-11 DIAGNOSIS — E785 Hyperlipidemia, unspecified: Secondary | ICD-10-CM | POA: Diagnosis not present

## 2019-10-11 DIAGNOSIS — E1169 Type 2 diabetes mellitus with other specified complication: Secondary | ICD-10-CM | POA: Diagnosis not present

## 2019-10-11 DIAGNOSIS — Z794 Long term (current) use of insulin: Secondary | ICD-10-CM | POA: Diagnosis not present

## 2019-10-11 DIAGNOSIS — E1140 Type 2 diabetes mellitus with diabetic neuropathy, unspecified: Secondary | ICD-10-CM | POA: Diagnosis not present

## 2019-10-11 DIAGNOSIS — K219 Gastro-esophageal reflux disease without esophagitis: Secondary | ICD-10-CM

## 2019-10-11 DIAGNOSIS — R630 Anorexia: Secondary | ICD-10-CM

## 2019-10-11 DIAGNOSIS — L02411 Cutaneous abscess of right axilla: Secondary | ICD-10-CM

## 2019-10-11 DIAGNOSIS — K529 Noninfective gastroenteritis and colitis, unspecified: Secondary | ICD-10-CM

## 2019-10-11 NOTE — Chronic Care Management (AMB) (Signed)
Chronic Care Management   Follow Up Note   10/11/2019 Name: MONTEEN TOOPS MRN: [id number] DOB: September 01, 1950  Referred by: [name], DO Reason for referral : Appointment (RNCM Follow up call) and Chronic Care Management (RNCM Follow up Call for Chronic Disease Management and Care Coordination Needs. )   SATOYA FEELEY is a 69 y.o. year old female who is a primary care patient of [name], DO. The CCM team was consulted for assistance with chronic disease management and care coordination needs.    Review of patient status, including review of consultants reports, relevant laboratory and other test results, and collaboration with appropriate care team members and the patient's provider was performed as part of comprehensive patient evaluation and provision of chronic care management services.    SDOH (Social Determinants of Health) assessments performed: Yes See Care Plan activities for detailed interventions related to [facility])     Outpatient Encounter Medications as of 10/11/2019  Medication Sig  . Blood Glucose Monitoring Suppl (ONETOUCH VERIO) w/Device KIT Use to check blood sugar up to 3 x daily  . cyclobenzaprine (FLEXERIL) 5 MG tablet Take 1-2 tablets (5-10 mg total) by mouth 3 (three) times daily as needed for muscle spasms.  . diclofenac sodium (VOLTAREN) 1 % GEL Apply 2 g topically 3 (three) times daily as needed.  . dicyclomine (BENTYL) 10 MG capsule Take 1 capsule (10 mg total) by mouth 4 (four) times daily -  before meals and at bedtime. (Patient not taking: Reported on 09/29/2019)  . Elastic Bandages & Supports (MEDICAL COMPRESSION STOCKINGS) MISC 1 each by Does not apply route daily.  . empagliflozin (JARDIANCE) 25 MG TABS tablet Take 25 mg by mouth daily with breakfast.  . famotidine (PEPCID) 20 MG tablet Take 1 tablet (20 mg total) by mouth daily. (Patient not taking: Reported on 09/29/2019)  . gabapentin (NEURONTIN) 100 MG capsule Take 2  capsules (200 mg total) by mouth in the morning, at noon, and at bedtime.  . hydrOXYzine (ATARAX/VISTARIL) 25 MG tablet Take 1 tablet (25 mg total) by mouth daily as needed for itching. For itching (Patient taking differently: Take 25 mg by mouth at bedtime. For itching)  . ibuprofen (ADVIL,MOTRIN) 200 MG tablet Take 600-800 mg by mouth every 6 (six) hours as needed for moderate pain.   . Insulin Glargine, 1 Unit Dial, (TOUJEO SOLOSTAR) 300 UNIT/ML SOPN Inject 70 Units into the skin daily. (Patient taking differently: Inject 76 Units into the skin daily. )  . Insulin Pen Needle 31G X 5 MM MISC 1 each by Does not apply route daily. Use to inject Toujeo daily. Dx:E11.9  . mesalamine (LIALDA) 1.2 g EC tablet Take 1 tablet (1.2 g total) by mouth 2 (two) times daily.  [name] omeprazole (PRILOSEC) 20 MG capsule Take 1 capsule (20 mg total) by mouth daily.  . ondansetron (ZOFRAN-ODT) 4 MG disintegrating tablet Take 1 tablet (4 mg total) by mouth every 8 (eight) hours as needed for nausea or vomiting.  [name] Lancets 11S MISC Use to check blood sugar up to 3 x daily  . ONETOUCH VERIO test strip Check blood sugar 3 x daily  . quinapril (ACCUPRIL) 20 MG tablet Take 1 tablet (20 mg total) by mouth at bedtime. (Patient not taking: Reported on 09/29/2019)  . Semaglutide,0.25 or 0.5MG/DOS, (OZEMPIC, 0.25 OR 0.5 MG/DOSE,) 2 MG/1.5ML SOPN Inject 0.5 mg into the skin once a week.  . sertraline (ZOLOFT) 50 MG tablet Take 1 tablet (50 mg  total) by mouth daily.  . simvastatin (ZOCOR) 40 MG tablet Take 1 tablet (40 mg total) by mouth at bedtime. Reported on 05/16/2015  . traMADol (ULTRAM) 50 MG tablet Take 1 tablet (50 mg total) by mouth every 6 (six) hours as needed.  . traZODone (DESYREL) 50 MG tablet Take 1 tablet (50 mg total) by mouth at bedtime.  . [DISCONTINUED] ranitidine (ZANTAC) 300 MG tablet Take 1 tablet (300 mg total) by mouth daily.   No facility-administered encounter medications on file as of  10/11/2019.     Objective:   BP Readings from Last 3 Encounters:  09/30/19 (!) 118/97  08/31/19 131/78  08/26/19 130/67   Goals Addressed              This Visit's Progress   .  RNCM- I need help with my diabetes (pt-stated)        Current Barriers:  [name] Knowledge Deficits related to basic Diabetes pathophysiology and self care/management . Knowledge Deficits related to medications used for management of diabetes . Knowledge Deficits related to adherence of a heart healthy/ADA diet . 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 daily monitoring and recording of CBG  . adherence to ADA/ carb modified diet . adherence to prescribed medication regimen  Interventions:  . Provided education to patient about basic DM disease process . Reviewed medications with patient and discussed importance of medication adherence . Discussed plans with patient for ongoing care management follow up and provided patient with direct contact information for care management team . Reviewed scheduled/upcoming provider appointments including: need to follow up with pcp, appointment to see podiatrist, endocrinology appointment  . Discussed the need to follow a Heart healthy/ADA diet and review of sugar substitutes  . Patient states she only takes her blood sugars 1 time a day and it was 120,  4 or 5 days ago.  The patient states her meter is missing and so is her toujeo pen is missing also. Her adult son and grandson live with her and the sons 42.77 year old daughter has been there and she feels like she may have gotten it and put it somewhere. This meter is over 61 years old. Will collaborate with the pcp and see if she can have an order sent to her pharmacy for a new meter  that her insurance will pay for. Education given on the importance of keeping devices and medications out of the reach of children. The patient states that the granddaughter is not there all the time and she was not thinking about her messing with her items.  The patient states she hasn't worked herself up to BID yet. Education on the importance of checking regularly to maintain health and well being.  10-11-2019: The patient states that she is back to checking her blood sugars and they are doing well. This am it was 126.  Last week when she was having testing she was not taking her blood sugars or taking her medications but she states she is on track now.  . Evaluation of foot exams. The patient does not check daily, states she does need to see the podiatrist, advised to check feet daily for sores and cuts . Stated she still needed to go to the eye doctor  Patient Self Care Activities:  .  UNABLE to independently manage diabetes as evidenced by a recent A1C of 14% on 10/21/18.  Reading on 05-10-2019 was 7.5.   Please see past updates related to this goal by clicking on the "Past Updates" button in the selected goal       .  RNCM: "I don't check my blood pressure" (pt-stated)        [place] (see longtitudinal plan of care for additional care plan information)  Current Barriers:  . Chronic Disease Management support, education, and care coordination needs related to HTN, HLD, GI problems, and Chronic pain  Clinical Goal(s) related to HTN, HLD, GI problems, and Chronic pain:  Over the next 120 days, patient will:  . Work with the care management team to address educational, disease management, and care coordination needs  . Begin or continue self health monitoring activities as directed today Measure and record blood pressure 2 times per week and adhere to heart healthy/ADA diet . Call provider office for new or worsened signs and symptoms Blood pressure findings outside established parameters,  Shortness of breath, and New or worsened symptom related to HLD, Chronic pain and other Chronic health conditions . Call care management team with questions or concerns . Verbalize basic understanding of patient centered plan of care established today  Interventions related to HTN, HLD, GI problems and Chronic pain:  . Evaluation of current treatment plans and patient's adherence to plan as established by provider.  The patient verbalized that the last several days she has not felt well at all. Was started on the 10th on ABX for cutaneous Abscess at her arm pit. The patient took 3 doses and stopped because she broke out in hives.  She did not report to the provider and she stopped taking the ABX.  The patient used peroxide and hot compresses to the area and it started "seeping" drainage. The patient says it is a lot better but admits it is sore where she has been "squeezing the area".  Education given on s/s of infection and worsening condition. Will let the pcp know of current findings. 10-11-2019: The patient states that the area is completely healed now and has no new issues.  . Assessed patient understanding of disease states. The patient verbalized she is going tomorrow to see the GI specialist due to her chronic diarrhea and changes in taste. The patient states she has had worsening diarrhea over the last several days. Education given that this could be related to the ABX she was prescribed. She says this has been going on for a while, just worse the last few days. The patient states she has a "nasty taste" in her mouth. It does not taste like "metal" but does taste "sour".  Education on discussing this with the GI specialist. Ask patient to write down questions to ask the GI specialist. 10-11-2019: Had EGD and colonoscopy. Found 2 polyps a small one and a larger one. Per the provier they were benign. She still has been having diarrhea but better. States that she thinks she was actually filled up with  stool. Sees GI specialist this week and will follow up with recommendations.  . Assessed patient's education and care coordination needs.  10-11-2019: The patient states she is moving back to [place] the end of August first of September. Wanted to know how to get her records transferred.  Education on the need to go by the office to sign a release of information paper.  . Assessed patients pain level. The  patient verbalized it is at a 3 today and much better than it has been. 08-30-2019: Pain level not assessed today.  . Provided disease specific education to patient.  Education on the benefits of taking blood pressures weekly and recording. The patient states her blood pressure is doing good. Denies any issues with her blood pressures.  . Evaluation of patients physical activity level: the patient states she has been out working in her yard and mowing her yard. Denies any acute distress.  Nash Dimmer with appropriate clinical care team members regarding patient needs  Patient Self Care Activities related to HTN, HLD, GI problems,  and Chronic pain:  . Patient is unable to independently self-manage chronic health conditions  Please see past updates related to this goal by clicking on the "Past Updates" button in the selected goal          Plan:   Telephone follow up appointment with care management team member scheduled for: 12-06-2019 at 2:15   [place], MSN, [place] Mobile: [phone]

## 2019-10-11 NOTE — Patient Instructions (Signed)
Visit Information  Goals Addressed              This Visit's Progress   .  RNCM- I need help with my diabetes (pt-stated)        Current Barriers:  [name] Knowledge Deficits related to basic Diabetes pathophysiology and self care/management . Knowledge Deficits related to medications used for management of diabetes . Knowledge Deficits related to adherence of a heart healthy/ADA diet . 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 daily monitoring and recording of CBG  . adherence to ADA/ carb modified diet . adherence to prescribed medication regimen  Interventions:  . Provided education to patient about basic DM disease process . Reviewed medications with patient and discussed importance of medication adherence . Discussed plans with patient for ongoing care management follow up and provided patient with direct contact information for care management team . Reviewed scheduled/upcoming provider appointments including: need to follow up with pcp, appointment to see podiatrist, endocrinology appointment  . Discussed the need to follow a Heart healthy/ADA diet and review of sugar substitutes  . Patient states she only takes her blood sugars 1 time a day and it was 120,  4 or 5 days ago.  The patient states her meter is missing and so is her toujeo pen is missing also. Her adult son and grandson live with her and the sons 32.39 year old daughter has been there and she feels like she may have gotten it and put it somewhere. This meter is over 45 years old. Will collaborate with the pcp and see if she can have an order sent to her pharmacy for a new meter that her insurance will pay for. Education given on the importance of keeping devices and medications out of the  reach of children. The patient states that the granddaughter is not there all the time and she was not thinking about her messing with her items.  The patient states she hasn't worked herself up to BID yet. Education on the importance of checking regularly to maintain health and well being.  10-11-2019: The patient states that she is back to checking her blood sugars and they are doing well. This am it was 126.  Last week when she was having testing she was not taking her blood sugars or taking her medications but she states she is on track now.  . Evaluation of foot exams. The patient does not check daily, states she does need to see the podiatrist, advised to check feet daily for sores and cuts . Stated she still needed to go to the eye doctor  Patient Self Care Activities:  . UNABLE to independently manage diabetes as evidenced by a recent A1C of 14% on 10/21/18.  Reading on 05-10-2019 was 7.5.   Please see past updates related to this goal by clicking on the "Past Updates" button in the selected goal       .  RNCM: "I don't check my blood pressure" (pt-stated)        [place] (see longtitudinal plan of care for additional care plan information)  Current Barriers:  . Chronic Disease Management support, education, and care coordination needs related to HTN, HLD, GI problems, and Chronic pain  Clinical Goal(s) related to HTN, HLD, GI problems, and Chronic pain:  Over the next 120 days, patient will:  . Work with the care management team to address educational, disease management, and care coordination needs  . Begin or continue self health monitoring activities as directed today Measure and record blood pressure 2 times per week and adhere to heart healthy/ADA diet . Call provider office for new or worsened signs and symptoms Blood pressure findings outside established parameters, Shortness of breath, and New or worsened symptom related to HLD, Chronic pain and other Chronic health  conditions . Call care management team with questions or concerns . Verbalize basic understanding of patient centered plan of care established today  Interventions related to HTN, HLD, GI problems and Chronic pain:  . Evaluation of current treatment plans and patient's adherence to plan as established by provider.  The patient verbalized that the last several days she has not felt well at all. Was started on the 10th on ABX for cutaneous Abscess at her arm pit. The patient took 3 doses and stopped because she broke out in hives.  She did not report to the provider and she stopped taking the ABX.  The patient used peroxide and hot compresses to the area and it started "seeping" drainage. The patient says it is a lot better but admits it is sore where she has been "squeezing the area".  Education given on s/s of infection and worsening condition. Will let the pcp know of current findings. 10-11-2019: The patient states that the area is completely healed now and has no new issues.  . Assessed patient understanding of disease states. The patient verbalized she is going tomorrow to see the GI specialist due to her chronic diarrhea and changes in taste. The patient states she has had worsening diarrhea over the last several days. Education given that this could be related to the ABX she was prescribed. She says this has been going on for a while, just worse the last few days. The patient states she has a "nasty taste" in her mouth. It does not taste like "metal" but does taste "sour".  Education on discussing this with the GI specialist. Ask patient to write down questions to ask the GI specialist. 10-11-2019: Had EGD and colonoscopy. Found 2 polyps a small one and a larger one. Per the provier they were benign. She still has been having diarrhea but better. States that she thinks she was actually filled up with stool. Sees GI specialist this week and will follow up with recommendations.  . Assessed patient's  education and care coordination needs.  10-11-2019: The patient states she is moving back to [place] the end of August first of September. Wanted to know how to get her records transferred.  Education on the need to go by the office to sign a release of information paper.  . Assessed patients pain level. The patient verbalized it is at a 3 today and much better than it has been. 08-30-2019: Pain level not assessed today.  . Provided disease specific education to patient.  Education on the benefits of taking blood pressures weekly and recording. The patient states her blood pressure is doing good. Denies any issues with her blood pressures.  . Evaluation of patients physical activity level: the patient states she has been out working in her yard and mowing her yard. Denies any acute distress.  Nash Dimmer with appropriate clinical care team members regarding patient needs  Patient Self Care Activities related to HTN, HLD, GI problems,  and Chronic pain:  . Patient  is unable to independently self-manage chronic health conditions  Please see past updates related to this goal by clicking on the "Past Updates" button in the selected goal         Patient verbalizes understanding of instructions provided today.   Telephone follow up appointment with care management team member scheduled for: 12-06-2019 at 2:15  [place], MSN, [place] Mobile: [phone]

## 2019-10-14 DIAGNOSIS — E1143 Type 2 diabetes mellitus with diabetic autonomic (poly)neuropathy: Secondary | ICD-10-CM | POA: Diagnosis not present

## 2019-10-14 DIAGNOSIS — E785 Hyperlipidemia, unspecified: Secondary | ICD-10-CM | POA: Diagnosis not present

## 2019-10-14 DIAGNOSIS — I152 Hypertension secondary to endocrine disorders: Secondary | ICD-10-CM | POA: Diagnosis not present

## 2019-10-14 DIAGNOSIS — E1159 Type 2 diabetes mellitus with other circulatory complications: Secondary | ICD-10-CM | POA: Diagnosis not present

## 2019-10-14 DIAGNOSIS — Z794 Long term (current) use of insulin: Secondary | ICD-10-CM | POA: Diagnosis not present

## 2019-10-14 DIAGNOSIS — E1169 Type 2 diabetes mellitus with other specified complication: Secondary | ICD-10-CM | POA: Diagnosis not present

## 2019-10-14 DIAGNOSIS — E669 Obesity, unspecified: Secondary | ICD-10-CM | POA: Diagnosis not present

## 2019-10-14 LAB — HEMOGLOBIN A1C: Hemoglobin A1C: 7.3

## 2019-10-18 ENCOUNTER — Telehealth: Payer: Self-pay

## 2019-10-19 DIAGNOSIS — B351 Tinea unguium: Secondary | ICD-10-CM | POA: Diagnosis not present

## 2019-10-19 DIAGNOSIS — E1140 Type 2 diabetes mellitus with diabetic neuropathy, unspecified: Secondary | ICD-10-CM | POA: Diagnosis not present

## 2019-10-19 DIAGNOSIS — Z794 Long term (current) use of insulin: Secondary | ICD-10-CM | POA: Diagnosis not present

## 2019-10-19 DIAGNOSIS — E119 Type 2 diabetes mellitus without complications: Secondary | ICD-10-CM | POA: Diagnosis not present

## 2019-10-20 ENCOUNTER — Ambulatory Visit (INDEPENDENT_AMBULATORY_CARE_PROVIDER_SITE_OTHER): Payer: Medicare Other | Admitting: Pharmacist

## 2019-10-20 DIAGNOSIS — Z794 Long term (current) use of insulin: Secondary | ICD-10-CM | POA: Diagnosis not present

## 2019-10-20 DIAGNOSIS — I10 Essential (primary) hypertension: Secondary | ICD-10-CM | POA: Diagnosis not present

## 2019-10-20 DIAGNOSIS — E1140 Type 2 diabetes mellitus with diabetic neuropathy, unspecified: Secondary | ICD-10-CM

## 2019-10-20 NOTE — Patient Instructions (Signed)
Thank you allowing the Chronic Care Management Team to be a part of your care! It was a pleasure speaking with you today!     CCM (Chronic Care Management) Team    Noreene Larsson RN, MSN, CCM Nurse Care Coordinator  [phone]   [name] PharmD  Clinical Pharmacist  [phone]   Eula Fried LCSW Clinical Social Worker [phone]  Visit Information  Goals Addressed              This Visit's Progress   .  PharmD- Medication Adherence (pt-stated)        Current Barriers:  [name] Knowledge deficits related to impact of diet on blood sugar and medications to manage blood sugar . Non Adherence to prescribed medication regimen . Financial Barriers . Lack of blood sugar results for clinical team  Pharmacist Clinical Goal(s):  [name] Over the next 30 days, patient will work with CM Pharmacist to address needs related to obtaining a blood glucose meter, medication adherence and medication regimen optimization  Interventions: . Perform chart review o Note patient seen by Endocrinology on 7/29 - A1C improved to 7.3% . Reports unable to review medications today as she is still packing for her move.  o Patient agreeable to reviewing medications during next call . Counsel on importance of blood sugar control and monitoring o Reports currently taking: - Toujeo 76 units once daily - Jardiance 25 mg once daily - Ozempic 0.5 mg weekly (on Fridays) o Reports fasting morning CBG yesterday ~130 o Counsel patient to monitor blood sugars fasting each morning and again before bed as directed by Endocrinologist . Encourage patient to restart monitoring home blood pressure and keep log of results o Reports located her BP monitor, but that it is packed up for the move . Counsel on importance of medication adherence o Denies currently using weekly pillbox. - Encourage patient to restart using . Encourage patient to schedule follow up appointment with PCP  . Counsel patient regarding  COVID-19 prevention and vaccination o Patient states she will not receive COVID-19 vaccination. Denies need for further information.  Patient Self Care Activities:  . Patient to check blood sugars twice daily as directed and keep log . Patient to attend provider appointments as scheduled   Please see past updates related to this goal by clicking on the "Past Updates" button in the selected goal         Patient verbalizes understanding of instructions provided today.   Telephone follow up appointment with care management team member scheduled for: 9/27 at 1 pm  [name], PharmD, [place] [phone]

## 2019-10-20 NOTE — Chronic Care Management (AMB) (Signed)
Chronic Care Management   Follow Up Note   10/20/2019 Name: Alyssa Perkins MRN: [id number] DOB: 01/10/1951  Referred by: [name], DO Reason for referral : Chronic Care Management (Patient Phone Call)   Alyssa Perkins is a 69 y.o. year old female who is a primary care patient of [name], DO. The CCM team was consulted for assistance with chronic disease management and care coordination needs.    I reached out to [name] by phone today.   Review of patient status, including review of consultants reports, relevant laboratory and other test results, and collaboration with appropriate care team members and the patient's provider was performed as part of comprehensive patient evaluation and provision of chronic care management services.     Outpatient Encounter Medications as of 10/20/2019  Medication Sig  . empagliflozin (JARDIANCE) 25 MG TABS tablet Take 25 mg by mouth daily with breakfast.  . Insulin Glargine, 1 Unit Dial, (TOUJEO SOLOSTAR) 300 UNIT/ML SOPN Inject 70 Units into the skin daily. (Patient taking differently: Inject 76 Units into the skin daily. )  . Semaglutide,0.25 or 0.5MG/DOS, (OZEMPIC, 0.25 OR 0.5 MG/DOSE,) 2 MG/1.5ML SOPN Inject 0.5 mg into the skin once a week.  . Blood Glucose Monitoring Suppl (ONETOUCH VERIO) w/Device KIT Use to check blood sugar up to 3 x daily  . cyclobenzaprine (FLEXERIL) 5 MG tablet Take 1-2 tablets (5-10 mg total) by mouth 3 (three) times daily as needed for muscle spasms.  . diclofenac sodium (VOLTAREN) 1 % GEL Apply 2 g topically 3 (three) times daily as needed.  . dicyclomine (BENTYL) 10 MG capsule Take 1 capsule (10 mg total) by mouth 4 (four) times daily -  before meals and at bedtime. (Patient not taking: Reported on 09/29/2019)  . Elastic Bandages & Supports (MEDICAL COMPRESSION STOCKINGS) MISC 1 each by Does not apply route daily.  . famotidine (PEPCID) 20 MG tablet Take 1 tablet (20 mg  total) by mouth daily. (Patient not taking: Reported on 09/29/2019)  . gabapentin (NEURONTIN) 100 MG capsule Take 2 capsules (200 mg total) by mouth in the morning, at noon, and at bedtime.  . hydrOXYzine (ATARAX/VISTARIL) 25 MG tablet Take 1 tablet (25 mg total) by mouth daily as needed for itching. For itching (Patient taking differently: Take 25 mg by mouth at bedtime. For itching)  . ibuprofen (ADVIL,MOTRIN) 200 MG tablet Take 600-800 mg by mouth every 6 (six) hours as needed for moderate pain.   . Insulin Pen Needle 31G X 5 MM MISC 1 each by Does not apply route daily. Use to inject Toujeo daily. Dx:E11.9  . mesalamine (LIALDA) 1.2 g EC tablet Take 1 tablet (1.2 g total) by mouth 2 (two) times daily.  [name] omeprazole (PRILOSEC) 20 MG capsule Take 1 capsule (20 mg total) by mouth daily.  . ondansetron (ZOFRAN-ODT) 4 MG disintegrating tablet Take 1 tablet (4 mg total) by mouth every 8 (eight) hours as needed for nausea or vomiting.  [name] Lancets 51Z MISC Use to check blood sugar up to 3 x daily  . ONETOUCH VERIO test strip Check blood sugar 3 x daily  . quinapril (ACCUPRIL) 20 MG tablet Take 1 tablet (20 mg total) by mouth at bedtime. (Patient not taking: Reported on 09/29/2019)  . sertraline (ZOLOFT) 50 MG tablet Take 1 tablet (50 mg total) by mouth daily.  . simvastatin (ZOCOR) 40 MG tablet Take 1 tablet (40 mg total) by mouth at bedtime. Reported on 05/16/2015  .  traMADol (ULTRAM) 50 MG tablet Take 1 tablet (50 mg total) by mouth every 6 (six) hours as needed.  . traZODone (DESYREL) 50 MG tablet Take 1 tablet (50 mg total) by mouth at bedtime.  . [DISCONTINUED] ranitidine (ZANTAC) 300 MG tablet Take 1 tablet (300 mg total) by mouth daily.   No facility-administered encounter medications on file as of 10/20/2019.    Goals Addressed              This Visit's Progress   .  PharmD- Medication Adherence (pt-stated)        Current Barriers:  [name] Knowledge deficits related to impact of  diet on blood sugar and medications to manage blood sugar . Non Adherence to prescribed medication regimen . Financial Barriers . Lack of blood sugar results for clinical team  Pharmacist Clinical Goal(s):  [name] Over the next 30 days, patient will work with CM Pharmacist to address needs related to obtaining a blood glucose meter, medication adherence and medication regimen optimization  Interventions: . Perform chart review o Note patient seen by Endocrinology on 7/29 - A1C improved to 7.3% . Reports unable to review medications today as she is still packing for her move.  o Patient agreeable to reviewing medications during next call . Counsel on importance of blood sugar control and monitoring o Reports currently taking: - Toujeo 76 units once daily - Jardiance 25 mg once daily - Ozempic 0.5 mg weekly (on Fridays) o Reports fasting morning CBG yesterday ~130 o Counsel patient to monitor blood sugars fasting each morning and again before bed as directed by Endocrinologist . Encourage patient to restart monitoring home blood pressure and keep log of results o Reports located her BP monitor, but that it is packed up for the move . Counsel on importance of medication adherence o Denies currently using weekly pillbox. - Encourage patient to restart using . Encourage patient to schedule follow up appointment with PCP  . Counsel patient regarding COVID-19 prevention and vaccination o Patient states she will not receive COVID-19 vaccination. Denies need for further information.  Patient Self Care Activities:  . Patient to check blood sugars twice daily as directed and keep log . Patient to attend provider appointments as scheduled   Please see past updates related to this goal by clicking on the "Past Updates" button in the selected goal         Plan  Telephone follow up appointment with care management team member scheduled for: 9/27 at 1 pm  [name], PharmD,  [facility] [phone]

## 2019-10-25 ENCOUNTER — Telehealth: Payer: Self-pay

## 2019-10-25 DIAGNOSIS — R197 Diarrhea, unspecified: Secondary | ICD-10-CM

## 2019-10-25 NOTE — Telephone Encounter (Signed)
Patient verbalized understanding. Patient will pick up stool kit and she made follow up appointment

## 2019-10-25 NOTE — Telephone Encounter (Signed)
Patient called and left a voicemail because she is taking the Mesalamine 1.2 grams twice a day. She states she is still having diarrhea and abdominal cramps daily. She states sometimes when she has the cramps she has to leave the store she is in or if she is at home she has to lay down.

## 2019-10-25 NOTE — Addendum Note (Signed)
Addended by: Ulyess Blossom L on: 10/25/2019 12:55 PM   Modules accepted: Orders

## 2019-10-25 NOTE — Telephone Encounter (Signed)
Please discontinue mesalamine and repeat GI profile PCR including C. difficile to rule out any infection  She can take Imodium as needed for now  Please make sure she has a follow-up to see me in about 2 to 3 weeks, okay to overbook  RV

## 2019-10-25 NOTE — Telephone Encounter (Signed)
Patient is having abdominal cramps that is all the time all day long they get worse and get better. She states she is having diarrhea once a day

## 2019-10-27 ENCOUNTER — Other Ambulatory Visit: Payer: Self-pay | Admitting: Family Medicine

## 2019-10-27 DIAGNOSIS — M5412 Radiculopathy, cervical region: Secondary | ICD-10-CM

## 2019-10-27 DIAGNOSIS — L299 Pruritus, unspecified: Secondary | ICD-10-CM

## 2019-10-27 DIAGNOSIS — K219 Gastro-esophageal reflux disease without esophagitis: Secondary | ICD-10-CM

## 2019-10-27 DIAGNOSIS — F3289 Other specified depressive episodes: Secondary | ICD-10-CM

## 2019-10-27 DIAGNOSIS — E1169 Type 2 diabetes mellitus with other specified complication: Secondary | ICD-10-CM

## 2019-10-27 DIAGNOSIS — E1140 Type 2 diabetes mellitus with diabetic neuropathy, unspecified: Secondary | ICD-10-CM

## 2019-10-27 DIAGNOSIS — G5603 Carpal tunnel syndrome, bilateral upper limbs: Secondary | ICD-10-CM

## 2019-10-27 DIAGNOSIS — G8929 Other chronic pain: Secondary | ICD-10-CM

## 2019-10-27 DIAGNOSIS — G44221 Chronic tension-type headache, intractable: Secondary | ICD-10-CM

## 2019-10-27 DIAGNOSIS — I10 Essential (primary) hypertension: Secondary | ICD-10-CM

## 2019-10-27 DIAGNOSIS — E785 Hyperlipidemia, unspecified: Secondary | ICD-10-CM

## 2019-10-27 MED ORDER — TRAZODONE HCL 50 MG PO TABS: 50.0000 mg | ORAL_TABLET | Freq: Every day | ORAL | 0 refills | Status: DC

## 2019-10-27 MED ORDER — SERTRALINE HCL 50 MG PO TABS: 50.0000 mg | ORAL_TABLET | Freq: Every day | ORAL | 0 refills | Status: DC

## 2019-10-27 MED ORDER — EMPAGLIFLOZIN 25 MG PO TABS: 25.0000 mg | ORAL_TABLET | Freq: Every day | ORAL | 0 refills | Status: AC

## 2019-10-27 MED ORDER — OMEPRAZOLE 20 MG PO CPDR: 20.0000 mg | DELAYED_RELEASE_CAPSULE | Freq: Every day | ORAL | 0 refills | Status: DC

## 2019-10-27 MED ORDER — TOUJEO SOLOSTAR 300 UNIT/ML ~~LOC~~ SOPN: 76.0000 [IU] | PEN_INJECTOR | Freq: Every day | SUBCUTANEOUS | 0 refills | Status: DC

## 2019-10-27 MED ORDER — CYCLOBENZAPRINE HCL 5 MG PO TABS: 5.0000 mg | ORAL_TABLET | Freq: Two times a day (BID) | ORAL | 0 refills | Status: DC | PRN

## 2019-10-27 MED ORDER — HYDROXYZINE HCL 25 MG PO TABS: 25.0000 mg | ORAL_TABLET | Freq: Every day | ORAL | 0 refills | Status: DC | PRN

## 2019-10-27 MED ORDER — QUINAPRIL HCL 20 MG PO TABS: 20.0000 mg | ORAL_TABLET | Freq: Every day | ORAL | 0 refills | Status: DC

## 2019-10-27 MED ORDER — TRAMADOL HCL 50 MG PO TABS: 50.0000 mg | ORAL_TABLET | Freq: Four times a day (QID) | ORAL | 0 refills | Status: DC | PRN

## 2019-10-27 MED ORDER — SIMVASTATIN 40 MG PO TABS: 40.0000 mg | ORAL_TABLET | Freq: Every day | ORAL | 0 refills | Status: AC

## 2019-10-27 MED ORDER — GABAPENTIN 100 MG PO CAPS: 200.0000 mg | ORAL_CAPSULE | Freq: Three times a day (TID) | ORAL | 0 refills | Status: DC

## 2019-10-27 MED ORDER — DICLOFENAC SODIUM 1 % EX CREA: 2.0000 g | TOPICAL_CREAM | Freq: Three times a day (TID) | CUTANEOUS | 0 refills | Status: AC | PRN

## 2019-10-27 NOTE — Telephone Encounter (Signed)
90 day rx on all meds requested  She will move in October 2021 to [place]. She will relocate PCP there.  Reviewed the list. I still had questions on which meds she actually needed. Called patient to clarify her address and order all meds to [facility] is her request.  Tramadol could not be sent to [facility] mail. Needed to be ordered to [facility].   Nobie Putnam, [facility] 10/27/2019, 5:45 PM

## 2019-11-11 DIAGNOSIS — K529 Noninfective gastroenteritis and colitis, unspecified: Secondary | ICD-10-CM | POA: Diagnosis not present

## 2019-11-11 DIAGNOSIS — R197 Diarrhea, unspecified: Secondary | ICD-10-CM | POA: Diagnosis not present

## 2019-11-11 DIAGNOSIS — R1030 Lower abdominal pain, unspecified: Secondary | ICD-10-CM | POA: Diagnosis not present

## 2019-11-13 LAB — GI PROFILE, STOOL, PCR

## 2019-11-17 ENCOUNTER — Encounter: Payer: Self-pay | Admitting: Gastroenterology

## 2019-11-17 ENCOUNTER — Other Ambulatory Visit: Payer: Self-pay

## 2019-11-17 ENCOUNTER — Ambulatory Visit (INDEPENDENT_AMBULATORY_CARE_PROVIDER_SITE_OTHER): Payer: Medicare Other | Admitting: Gastroenterology

## 2019-11-17 VITALS — BP 116/73 | HR 82 | Temp 97.9°F | Ht 63.0 in | Wt 205.6 lb

## 2019-11-17 DIAGNOSIS — K529 Noninfective gastroenteritis and colitis, unspecified: Secondary | ICD-10-CM | POA: Diagnosis not present

## 2019-11-17 DIAGNOSIS — R1030 Lower abdominal pain, unspecified: Secondary | ICD-10-CM

## 2019-11-17 DIAGNOSIS — K219 Gastro-esophageal reflux disease without esophagitis: Secondary | ICD-10-CM | POA: Diagnosis not present

## 2019-11-17 MED ORDER — RIFAXIMIN 550 MG PO TABS: 550.0000 mg | ORAL_TABLET | Freq: Three times a day (TID) | ORAL | 0 refills | Status: AC

## 2019-11-17 NOTE — Progress Notes (Signed)
Cephas Darby, MD [address]  [place], [place] [address]  Main: [phone]  Fax: [phone]    Gastroenterology Consultation  Referring Provider:     Nobie Putnam * Primary Care Physician:  [name], DO Primary Gastroenterologist:  Dr. Cephas Darby Reason for Consultation:     Chronic diarrhea, weight loss        HPI:   Alyssa Perkins is a 69 y.o. female referred by Dr. Parks Ranger, Devonne Doughty, DO  for consultation & management of chronic diarrhea, weight loss  Chronic diarrhea: Patient reports that for last 1 year, she has been experiencing nonbloody loose bowel movements, 3-4 times during the day and 1 or 2 times at night.  It is associated with weight loss.  Generally does not eat 3 meals a day, does not have good appetite at baseline.  She does report lower abdominal cramps as well as abdominal bloating.  She denies any rectal bleeding, nausea or vomiting.  Patient does not identify any food triggers.  She does report her diarrhea is mostly postprandial.  She did not undergo stool studies to rule out infection.  She said she tried Imodium which temporarily helped.  She does acknowledge stress in her life and is planning to move to [place].  Patient is accompanied by her sister today She has history of metabolic syndrome, on insulin  Chronic GERD: Patient reports severe heartburn after she eats.  She takes over-the-counter Pepcid and Prilosec as needed.  She denies nocturnal heartburn, difficulty swallowing, nausea or vomiting  Follow-up visit 11/17/2019 Patient had elevated fecal calprotectin levels.  I have empirically started her on mesalamine which did not help.  She underwent colonoscopy including biopsies, did not reveal evidence of IBD.  She underwent repeat stool studies due to worsening of diarrhea, negative for infection.  She also reports diffuse abdominal pain predominantly central and lower abdomen associated with  abdominal bloating and intermittent episodes of nonbloody diarrhea.  Celiac disease panel negative, pancreatic fecal elastase levels normal, elevated fecal calprotectin levels 210  She does not smoke or drink alcohol  NSAIDs: None  Antiplts/Anticoagulants/Anti thrombotics: None  GI Procedures: Colonoscopy in 03/2010 Upper endoscopy more than 10 years ago  Upper endoscopy and colonoscopy 09/29/2019 - Normal esophagus. - Normal stomach. - Normal examined duodenum. - No specimens collected.  - Preparation of the colon was poor. - Normal mucosa in the entire examined colon. Biopsied. - A few small polyps in the transverse colon and in the ascending colon. Resection not attempted. - The distal rectum and anal verge are normal on retroflexion view.  DIAGNOSIS:  A. RIGHT COLON; COLD BIOPSY:  - COLONIC MUCOSA WITH INTACT CRYPT ARCHITECTURE.  - NEGATIVE FOR MICROSCOPIC COLITIS, DYSPLASIA, AND MALIGNANCY.   B. LEFT COLON; COLD BIOPSY:  - COLONIC MUCOSA WITH INTACT CRYPT ARCHITECTURE.  - NEGATIVE FOR MICROSCOPIC COLITIS, DYSPLASIA, AND MALIGNANCY.   Colonoscopy 09/30/2019 - One 12 mm polyp in the ascending colon, removed with mucosal resection. Resected and retrieved. Clips (MR conditional) were placed. - One 4 mm polyp in the transverse colon, removed with a cold snare. Resected and retrieved. - Diverticulosis in the sigmoid colon. - Non-bleeding external hemorrhoids.  DIAGNOSIS:  A. COLON POLYP, ASCENDING; HOT SNARE:  - TUBULAR ADENOMA, 2 FRAGMENTS.  - NEGATIVE FOR HIGH-GRADE DYSPLASIA AND MALIGNANCY.   B. COLON POLYP, TRANSVERSE; COLD SNARE:  - TUBULAR ADENOMA.  - NEGATIVE FOR HIGH-GRADE DYSPLASIA AND MALIGNANCY.   Past Medical History:  Diagnosis  Date  . Anginal pain ([place])   . Anxiety    not on any medication at this time  . Arthritis   . Asthma    "sports asthma or exercised induced"seen at [place] for primary  . Chronic back pain   . Chronic  neck pain   . Complication of anesthesia    "difficulty waking up from surgery"  . Depression    has OCD, states counts constantly  . GERD (gastroesophageal reflux disease)   . Headache(784.0)    hx of migraines  . Hyperlipidemia   . IBS (irritable bowel syndrome)   . PONV (postoperative nausea and vomiting)   . RLS (restless legs syndrome)   . Sleep apnea   . Thyroid disease     Past Surgical History:  Procedure Laterality Date  . ABDOMINAL HYSTERECTOMY     also had oophorectomy  . AMPUTATION TOE Right 09/05/2017   Procedure: AMPUTATION TOE-RIGHT 2ND TOE;  Surgeon: Sharlotte Alamo, DPM;  Location: [facility] ORS;  Service: Podiatry;  Laterality: Right;  . ANTERIOR CERVICAL DECOMP/DISCECTOMY FUSION  01/02/2012   Procedure: ANTERIOR CERVICAL DECOMPRESSION/DISCECTOMY FUSION 2 LEVELS;  Surgeon: Melina Schools, MD;  Location: [facility];  Service: Orthopedics;  Laterality: N/A;  ACDF C5-7  . BILATERAL CARPAL TUNNEL RELEASE    . CARDIAC CATHETERIZATION     done approx. 4- 5 years ago, Dr. Leanor Kail, does not see cardi. at this time.  . CHOLECYSTECTOMY    . COLONOSCOPY WITH PROPOFOL N/A 09/29/2019   Procedure: COLONOSCOPY WITH PROPOFOL;  Surgeon: Lin Landsman, MD;  Location: [facility] ENDOSCOPY;  Service: Gastroenterology;  Laterality: N/A;  . COLONOSCOPY WITH PROPOFOL N/A 09/30/2019   Procedure: COLONOSCOPY WITH PROPOFOL;  Surgeon: Lin Landsman, MD;  Location[facility] ENDOSCOPY;  Service: Gastroenterology;  Laterality: N/A;  . DILATION AND CURETTAGE OF UTERUS     "several"  . ESOPHAGOGASTRODUODENOSCOPY (EGD) WITH PROPOFOL N/A 09/29/2019   Procedure: ESOPHAGOGASTRODUODENOSCOPY (EGD) WITH PROPOFOL;  Surgeon: Lin Landsman, MD;  Location: [facility];  Service: Gastroenterology;  Laterality: N/A;  . HERNIA REPAIR    . IRRIGATION AND DEBRIDEMENT FOOT  09/05/2017   Procedure: IRRIGATION AND DEBRIDEMENT FOOT;  Surgeon: Sharlotte Alamo, DPM;  Location: [facility] ORS;  Service: Podiatry;;  . KNEE  SURGERY     1 partial left, 1 total left  . SPINE SURGERY      Current Outpatient Medications:  .  cyclobenzaprine (FLEXERIL) 5 MG tablet, Take 1-2 tablets (5-10 mg total) by mouth 2 (two) times daily as needed for muscle spasms., Disp: 180 tablet, Rfl: 0 .  Diclofenac Sodium 1 % CREA, Apply 2 g topically 3 (three) times daily as needed., Disp: 120 g, Rfl: 0 .  empagliflozin (JARDIANCE) 25 MG TABS tablet, Take 1 tablet (25 mg total) by mouth daily with breakfast., Disp: 90 tablet, Rfl: 0 .  gabapentin (NEURONTIN) 100 MG capsule, Take 2 capsules (200 mg total) by mouth in the morning, at noon, and at bedtime., Disp: 540 capsule, Rfl: 0 .  hydrOXYzine (ATARAX/VISTARIL) 25 MG tablet, Take 1 tablet (25 mg total) by mouth daily as needed for itching. For itching, Disp: 90 tablet, Rfl: 0 .  ibuprofen (ADVIL,MOTRIN) 200 MG tablet, Take 600-800 mg by mouth every 6 (six) hours as needed for moderate pain. , Disp: , Rfl:  .  insulin glargine, 1 Unit Dial, (TOUJEO SOLOSTAR) 300 UNIT/ML Solostar Pen, Inject 76 Units into the skin daily., Disp: 15 mL, Rfl: 0 .  Insulin Pen Needle  31G X 5 MM MISC, 1 each by Does not apply route daily. Use to inject Toujeo daily. Dx:E11.9, Disp: 100 each, Rfl: 0 .  mesalamine (LIALDA) 1.2 g EC tablet, Take 1 tablet (1.2 g total) by mouth 2 (two) times daily., Disp: 180 tablet, Rfl: 0 .  omeprazole (PRILOSEC) 20 MG capsule, Take 1 capsule (20 mg total) by mouth daily., Disp: 90 capsule, Rfl: 0 .  ondansetron (ZOFRAN-ODT) 4 MG disintegrating tablet, Take 1 tablet (4 mg total) by mouth every 8 (eight) hours as needed for nausea or vomiting., Disp: 20 tablet, Rfl: 0 .  quinapril (ACCUPRIL) 20 MG tablet, Take 1 tablet (20 mg total) by mouth at bedtime., Disp: 90 tablet, Rfl: 0 .  Semaglutide,0.25 or 0.5MG /DOS, (OZEMPIC, 0.25 OR 0.5 MG/DOSE,) 2 MG/1.5ML SOPN, Inject 0.5 mg into the skin once a week., Disp: , Rfl:  .  sertraline (ZOLOFT) 50 MG tablet, Take 1 tablet (50 mg total) by  mouth daily., Disp: 90 tablet, Rfl: 0 .  simvastatin (ZOCOR) 40 MG tablet, Take 1 tablet (40 mg total) by mouth at bedtime., Disp: 90 tablet, Rfl: 0 .  traMADol (ULTRAM) 50 MG tablet, Take 1 tablet (50 mg total) by mouth every 6 (six) hours as needed., Disp: 30 tablet, Rfl: 0 .  traZODone (DESYREL) 50 MG tablet, Take 1 tablet (50 mg total) by mouth at bedtime., Disp: 90 tablet, Rfl: 0 .  Blood Glucose Monitoring Suppl (ONETOUCH VERIO) w/Device KIT, Use to check blood sugar up to 3 x daily, Disp: 1 kit, Rfl: 0 .  dicyclomine (BENTYL) 10 MG capsule, Take 1 capsule (10 mg total) by mouth 4 (four) times daily -  before meals and at bedtime. (Patient not taking: Reported on 09/29/2019), Disp: 90 capsule, Rfl: 0 .  Elastic Bandages & Supports (Kirvin) MISC, 1 each by Does not apply route daily., Disp: 2 each, Rfl: 0 .  famotidine (PEPCID) 20 MG tablet, Take 1 tablet (20 mg total) by mouth daily. (Patient not taking: Reported on 09/29/2019), Disp: 14 tablet, Rfl: 0 .  OneTouch Delica Lancets 91Y MISC, Use to check blood sugar up to 3 x daily, Disp: 300 each, Rfl: 2 .  ONETOUCH VERIO test strip, Check blood sugar 3 x daily, Disp: 300 each, Rfl: 5 .  rifaximin (XIFAXAN) 550 MG TABS tablet, Take 1 tablet (550 mg total) by mouth 3 (three) times daily for 14 days., Disp: 42 tablet, Rfl: 0   Family History  Problem Relation Age of Onset  . Heart disease Mother   . Osteoporosis Mother   . Breast cancer Mother        dx with ovarian ca 1st  . Ovarian cancer Mother        49's  . Osteoporosis Sister   . Diabetes Sister   . Breast cancer Sister        early 40's no braca testing   . Cancer Brother        sinus, bone  . Osteoporosis Brother      Social History   Tobacco Use  . Smoking status: Former Smoker    Packs/day: 0.50    Years: 15.00    Pack years: 7.50    Types: Cigarettes    Quit date: 07/17/1994    Years since quitting: 25.3  . Smokeless tobacco: Former Systems developer  .  Tobacco comment: stopped 18 years ago  Vaping Use  . Vaping Use: Never used  Substance Use Topics  . Alcohol use: No  .  Drug use: No    Allergies as of 11/17/2019 - Review Complete 11/17/2019  Allergen Reaction Noted  . Bactrim [sulfamethoxazole-trimethoprim] Rash 08/31/2019    Review of Systems:    All systems reviewed and negative except where noted in HPI.   Physical Exam:  BP 116/73 (BP Location: Right Arm, Patient Position: Sitting, Cuff Size: Normal)   Pulse 82   Temp 97.9 F (36.6 C) (Oral)   Ht 5' 3"  (1.6 m)   Wt 205 lb 9.6 oz (93.3 kg)   BMI 36.42 kg/m  No LMP recorded. Patient has had a hysterectomy.  General:   Alert,  Well-developed, well-nourished, pleasant and cooperative in NAD Head:  Normocephalic and atraumatic. Eyes:  Sclera clear, no icterus.   Conjunctiva pink. Ears:  Normal auditory acuity. Nose:  No deformity, discharge, or lesions. Mouth:  No deformity or lesions,oropharynx pink & moist. Neck:  Supple; no masses or thyromegaly. Lungs:  Respirations even and unlabored.  Clear throughout to auscultation.   No wheezes, crackles, or rhonchi. No acute distress. Heart:  Regular rate and rhythm; no murmurs, clicks, rubs, or gallops. Abdomen:  Normal bowel sounds. Soft, non-tender and grossly distended tympanic to percussion, without masses, hepatosplenomegaly or hernias noted.  No guarding or rebound tenderness.   Rectal: Not performed Msk:  Symmetrical without gross deformities. Good, equal movement & strength bilaterally. Pulses:  Normal pulses noted. Extremities:  No clubbing or edema.  No cyanosis. Neurologic:  Alert and oriented x3;  grossly normal neurologically. Skin:  Intact without significant lesions or rashes. No jaundice. Psych:  Alert and cooperative. Normal mood and affect.  Imaging Studies: No recent abdominal imaging  Assessment and Plan:   Alyssa Perkins is a 69 y.o. female with metabolic syndrome is seen for follow-up of chronic  diarrhea with unintentional weight loss  Chronic GERD: Fairly controlled EGD is unremarkable Continue antireflux lifestyle Continue Prilosec 20 mg daily and Pepcid as needed  Chronic diarrhea and unintentional weight loss Weight loss stabilized Stool studies are negative for infection, elevated fecal calprotectin levels, however there is no evidence of IBD based on colonoscopy, patient did not respond to mesalamine celiac disease panel, pancreatic fecal elastase levels unremarkable Recommend video capsule endoscopy to evaluate small bowel and CT abdomen and pelvis with contrast to evaluate for any pancreatic pathology   Follow up in 3 months   Cephas Darby, MD

## 2019-11-17 NOTE — Patient Instructions (Signed)
Your CT scan is scheduled for 11/29/2019 at the outpatient imaging. Please arrived at 2:45pm for a 3:00pm scan.  Nothing to eat or drink 4 hours before scan. Please pick up contrast before CT scan at the medical mall or outpatient imaging .  Address is [address], [place],  [address]

## 2019-11-24 DIAGNOSIS — K529 Noninfective gastroenteritis and colitis, unspecified: Secondary | ICD-10-CM | POA: Diagnosis not present

## 2019-11-29 ENCOUNTER — Other Ambulatory Visit: Admission: RE | Admit: 2019-11-29 | Source: Ambulatory Visit

## 2019-11-29 ENCOUNTER — Ambulatory Visit
Admission: RE | Admit: 2019-11-29 | Discharge: 2019-11-29 | Disposition: A | Discharge status: Home / Self Care | Payer: Medicare Other | Source: Ambulatory Visit | Attending: Gastroenterology | Admitting: Gastroenterology

## 2019-11-29 ENCOUNTER — Other Ambulatory Visit: Payer: Self-pay

## 2019-11-29 DIAGNOSIS — R1030 Lower abdominal pain, unspecified: Secondary | ICD-10-CM | POA: Insufficient documentation

## 2019-11-29 DIAGNOSIS — K529 Noninfective gastroenteritis and colitis, unspecified: Secondary | ICD-10-CM | POA: Insufficient documentation

## 2019-11-29 DIAGNOSIS — R1032 Left lower quadrant pain: Secondary | ICD-10-CM | POA: Diagnosis not present

## 2019-11-29 LAB — CALPROTECTIN, FECAL: Calprotectin, Fecal: 80 ug/g (ref 0–120)

## 2019-11-29 LAB — POCT I-STAT CREATININE: Creatinine, Ser: 1.1 mg/dL — ABNORMAL HIGH (ref 0.44–1.00)

## 2019-11-29 MED ORDER — IOHEXOL 300 MG/ML  SOLN
100.0000 mL | Freq: Once | INTRAMUSCULAR | Status: AC | PRN
  Administered 2019-11-29: 80 mL via INTRAVENOUS

## 2019-12-01 ENCOUNTER — Encounter: Payer: Self-pay | Admitting: Anesthesiology

## 2019-12-01 ENCOUNTER — Encounter
Admission: RE | Disposition: A | Discharge status: Home / Self Care | Payer: Self-pay | Source: Home / Self Care | Attending: Gastroenterology

## 2019-12-01 ENCOUNTER — Encounter: Payer: Self-pay | Admitting: Gastroenterology

## 2019-12-01 ENCOUNTER — Ambulatory Visit
Admission: RE | Admit: 2019-12-01 | Discharge: 2019-12-01 | Disposition: A | Discharge status: Home / Self Care | Payer: Medicare Other | Attending: Gastroenterology | Admitting: Gastroenterology

## 2019-12-01 DIAGNOSIS — R1030 Lower abdominal pain, unspecified: Secondary | ICD-10-CM | POA: Diagnosis not present

## 2019-12-01 DIAGNOSIS — R197 Diarrhea, unspecified: Secondary | ICD-10-CM | POA: Diagnosis not present

## 2019-12-01 DIAGNOSIS — M1990 Unspecified osteoarthritis, unspecified site: Secondary | ICD-10-CM | POA: Diagnosis not present

## 2019-12-01 DIAGNOSIS — Z87891 Personal history of nicotine dependence: Secondary | ICD-10-CM | POA: Diagnosis not present

## 2019-12-01 DIAGNOSIS — F329 Major depressive disorder, single episode, unspecified: Secondary | ICD-10-CM | POA: Diagnosis not present

## 2019-12-01 DIAGNOSIS — G4730 Sleep apnea, unspecified: Secondary | ICD-10-CM | POA: Insufficient documentation

## 2019-12-01 DIAGNOSIS — Z794 Long term (current) use of insulin: Secondary | ICD-10-CM | POA: Diagnosis not present

## 2019-12-01 DIAGNOSIS — J45990 Exercise induced bronchospasm: Secondary | ICD-10-CM | POA: Diagnosis not present

## 2019-12-01 DIAGNOSIS — Z79899 Other long term (current) drug therapy: Secondary | ICD-10-CM | POA: Insufficient documentation

## 2019-12-01 DIAGNOSIS — E785 Hyperlipidemia, unspecified: Secondary | ICD-10-CM | POA: Diagnosis not present

## 2019-12-01 DIAGNOSIS — K529 Noninfective gastroenteritis and colitis, unspecified: Secondary | ICD-10-CM | POA: Insufficient documentation

## 2019-12-01 DIAGNOSIS — R634 Abnormal weight loss: Secondary | ICD-10-CM | POA: Diagnosis not present

## 2019-12-01 HISTORY — PX: GIVENS CAPSULE STUDY: SHX5432

## 2019-12-01 SURGERY — IMAGING PROCEDURE, GI TRACT, INTRALUMINAL, VIA CAPSULE

## 2019-12-01 MED ORDER — MIDAZOLAM HCL 2 MG/2ML IJ SOLN
INTRAMUSCULAR | Status: AC
  Filled 2019-12-01: qty 2

## 2019-12-01 MED ORDER — FENTANYL CITRATE (PF) 100 MCG/2ML IJ SOLN
INTRAMUSCULAR | Status: AC
  Filled 2019-12-01: qty 2

## 2019-12-06 ENCOUNTER — Telehealth: Payer: Self-pay

## 2019-12-06 ENCOUNTER — Encounter: Payer: Self-pay | Admitting: Gastroenterology

## 2019-12-06 ENCOUNTER — Telehealth: Payer: Self-pay | Admitting: General Practice

## 2019-12-06 ENCOUNTER — Telehealth: Payer: Self-pay | Admitting: Gastroenterology

## 2019-12-06 NOTE — Telephone Encounter (Signed)
  Chronic Care Management   Outreach Note  12/06/2019 Name: Alyssa Perkins MRN: [id number] DOB: 10-24-1950  Referred by: [name], DO Reason for referral : Chronic Care Management (RNCM Follow up for Chronic Disease Management and Care Coordination Needs. )   An unsuccessful telephone outreach was attempted today. The patient was referred to the case management team for assistance with care management and care coordination.   Follow Up Plan: A HIPAA compliant phone message was left for the patient providing contact information and requesting a return call.   Noreene Larsson RN, MSN, [facility] Mobile: [phone]

## 2019-12-06 NOTE — Telephone Encounter (Signed)
Patient states she had some tests done last week (looks like labs and CT) and would like the results. Please call pt back.

## 2019-12-06 NOTE — Telephone Encounter (Signed)
Patient states she can not get into mychart. Went over results with patient and she verbalized understanding of results

## 2019-12-06 NOTE — Telephone Encounter (Signed)
Informed patient that capsule study was normal. She verbalized understanding

## 2019-12-08 ENCOUNTER — Telehealth: Payer: Self-pay

## 2019-12-08 DIAGNOSIS — E113393 Type 2 diabetes mellitus with moderate nonproliferative diabetic retinopathy without macular edema, bilateral: Secondary | ICD-10-CM | POA: Diagnosis not present

## 2019-12-08 LAB — HM DIABETES EYE EXAM

## 2019-12-08 NOTE — Chronic Care Management (AMB) (Signed)
  Care Management   Note  12/08/2019 Name: LILLIEANNA TUOHY MRN: [id number] DOB: [birth year]  ALTHEA BACKS is a 69 y.o. year old female who is a primary care patient of [name], DO and is actively engaged with the care management team. I reached out to [name] by phone today to assist with re-scheduling a follow up visit with the RN Case Manager  Follow up plan: Unsuccessful telephone outreach attempt made. A HIPAA compliant phone message was left for the patient providing contact information and requesting a return call.  The care management team will reach out to the patient again over the next 7 days.  If patient returns call to provider office, please advise to call [place]  at [place], [place], [place], [place] [address] Direct Dial: [phone] [email or web address]: [email or web address]

## 2019-12-13 ENCOUNTER — Telehealth: Payer: Self-pay | Admitting: Pharmacist

## 2019-12-13 ENCOUNTER — Telehealth: Payer: Self-pay

## 2019-12-13 NOTE — Telephone Encounter (Signed)
  Chronic Care Management   Outreach Note  12/13/2019 Name: DYANE BROBERG MRN: [id number] DOB: [birth year]  Referred by: [name], DO Reason for referral : No chief complaint on file.  Was unable to reach patient via telephone today and have left HIPAA compliant voicemail asking patient to return my call.    Follow Up Plan: Will collaborate with Care Guide to outreach to schedule follow up with me  [name], PharmD, [facility] [phone]

## 2019-12-14 NOTE — Telephone Encounter (Signed)
Pt has been r/s  

## 2019-12-14 NOTE — Chronic Care Management (AMB) (Signed)
  Care Management   Note  12/14/2019 Name: YULEIMY KRETZ MRN: [id number] DOB: 1951/03/16  FARRIS BLASH is a 69 y.o. year old female who is a primary care patient of [name], DO and is actively engaged with the care management team. I reached out to [name] by phone today to assist with re-scheduling a follow up visit with the RN Case Manager Pharmacist  Follow up plan: Telephone appointment with care management team member scheduled for: Vena Austria 01/05/2020 RN CM 01/17/2020  Noreene Larsson, [place], [place], [place] [address] Direct Dial: [phone] [email or web address]

## 2019-12-20 ENCOUNTER — Other Ambulatory Visit: Payer: Self-pay | Admitting: Family Medicine

## 2019-12-20 DIAGNOSIS — Z794 Long term (current) use of insulin: Secondary | ICD-10-CM

## 2019-12-20 DIAGNOSIS — E1140 Type 2 diabetes mellitus with diabetic neuropathy, unspecified: Secondary | ICD-10-CM

## 2019-12-20 NOTE — Telephone Encounter (Signed)
Requested medication (s) are due for refill today: yes  Requested medication (s) are on the active medication list: yes  Last refill:  10/27/19  Future visit scheduled: no  Notes to clinic:  blood work overdue   Requested Prescriptions  Pending Prescriptions Disp Refills   TOUJEO SOLOSTAR 300 UNIT/ML Solostar Pen [employer] Med Name: INSULIN GLARGINE 300 UNT/ML SOLOSTR1.5ML] 15 mL 0    Sig: INJECT 76 [place] - (EACH PEN EXPIRES 56 DAYS AFTER FIRST USE)      Endocrinology:  Diabetes - Insulins Failed - 12/20/2019 11:04 AM      Failed - HBA1C is between 0 and 7.9 and within 180 days    Hemoglobin A1C  Date Value Ref Range Status  05/10/2019 7.5 (A) 4.0 - 5.6 % Final   Hgb A1c MFr Bld  Date Value Ref Range Status  09/02/2017 10.9 (H) 4.8 - 5.6 % Final    Comment:    (NOTE) Pre diabetes:          5.7%-6.4% Diabetes:              >6.4% Glycemic control for   <7.0% adults with diabetes           Passed - Valid encounter within last 6 months    Recent Outpatient Visits           3 months ago Cutaneous abscess of right axilla   [facility], Lupita Raider, FNP   7 months ago Type 2 diabetes mellitus with diabetic neuropathy, with long-term current use of insulin [facility])   [place], Devonne Doughty, DO   8 months ago Suspected COVID-19 virus infection   [place], DO   1 year ago Uncontrolled type 2 diabetes mellitus with diabetic neuropathy, with long-term current use of insulin [facility])   [place], DO   1 year ago Elevated serum glucose with glucosuria   [place], [place]

## 2020-01-05 ENCOUNTER — Ambulatory Visit: Payer: Medicare Other | Admitting: Pharmacist

## 2020-01-05 DIAGNOSIS — E1140 Type 2 diabetes mellitus with diabetic neuropathy, unspecified: Secondary | ICD-10-CM

## 2020-01-05 DIAGNOSIS — Z794 Long term (current) use of insulin: Secondary | ICD-10-CM

## 2020-01-05 NOTE — Chronic Care Management (AMB) (Signed)
Chronic Care Management   Follow Up Note   01/05/2020 Name: Alyssa Perkins MRN: [id number] DOB: 07-24-1950  Referred by: [name], DO Reason for referral : Chronic Care Management (Patient Phone Call)   Alyssa Perkins is a 69 y.o. year old female who is a primary care patient of [name], DO. The CCM team was consulted for assistance with chronic disease management and care coordination needs.    I reached out to [name] by phone today as scheduled with patient by Care Guide. Speak with patient briefly as she reports that she is not currently home.  Review of patient status, including review of consultants reports, relevant laboratory and other test results, and collaboration with appropriate care team members and the patient's provider was performed as part of comprehensive patient evaluation and provision of chronic care management services.    SDOH (Social Determinants of Health) assessments performed: No See Care Plan activities for detailed interventions related to [facility])     Outpatient Encounter Medications as of 01/05/2020  Medication Sig  . Blood Glucose Monitoring Suppl (ONETOUCH VERIO) w/Device KIT Use to check blood sugar up to 3 x daily  . cyclobenzaprine (FLEXERIL) 5 MG tablet Take 1-2 tablets (5-10 mg total) by mouth 2 (two) times daily as needed for muscle spasms.  . Diclofenac Sodium 1 % CREA Apply 2 g topically 3 (three) times daily as needed.  . dicyclomine (BENTYL) 10 MG capsule Take 1 capsule (10 mg total) by mouth 4 (four) times daily -  before meals and at bedtime. (Patient not taking: Reported on 09/29/2019)  . Elastic Bandages & Supports (MEDICAL COMPRESSION STOCKINGS) MISC 1 each by Does not apply route daily.  . empagliflozin (JARDIANCE) 25 MG TABS tablet Take 1 tablet (25 mg total) by mouth daily with breakfast.  . famotidine (PEPCID) 20 MG tablet Take 1 tablet (20 mg total) by mouth daily. (Patient not  taking: Reported on 09/29/2019)  . gabapentin (NEURONTIN) 100 MG capsule Take 2 capsules (200 mg total) by mouth in the morning, at noon, and at bedtime.  . hydrOXYzine (ATARAX/VISTARIL) 25 MG tablet Take 1 tablet (25 mg total) by mouth daily as needed for itching. For itching  . ibuprofen (ADVIL,MOTRIN) 200 MG tablet Take 600-800 mg by mouth every 6 (six) hours as needed for moderate pain.   . Insulin Pen Needle 31G X 5 MM MISC 1 each by Does not apply route daily. Use to inject Toujeo daily. Dx:E11.9  . mesalamine (LIALDA) 1.2 g EC tablet Take 1 tablet (1.2 g total) by mouth 2 (two) times daily.  [name] omeprazole (PRILOSEC) 20 MG capsule Take 1 capsule (20 mg total) by mouth daily.  . ondansetron (ZOFRAN-ODT) 4 MG disintegrating tablet Take 1 tablet (4 mg total) by mouth every 8 (eight) hours as needed for nausea or vomiting.  [name] Lancets 98X MISC Use to check blood sugar up to 3 x daily  . ONETOUCH VERIO test strip Check blood sugar 3 x daily  . quinapril (ACCUPRIL) 20 MG tablet Take 1 tablet (20 mg total) by mouth at bedtime.  . Semaglutide,0.25 or 0.5MG/DOS, (OZEMPIC, 0.25 OR 0.5 MG/DOSE,) 2 MG/1.5ML SOPN Inject 0.5 mg into the skin once a week.  . sertraline (ZOLOFT) 50 MG tablet Take 1 tablet (50 mg total) by mouth daily.  . simvastatin (ZOCOR) 40 MG tablet Take 1 tablet (40 mg total) by mouth at bedtime.  Nelva Nay SOLOSTAR 300 UNIT/ML Solostar Pen INJECT 76  UNITS SUBCUTANEOUSLY EVERY DAY - (EACH PEN EXPIRES 56 DAYS AFTER FIRST USE)  . traMADol (ULTRAM) 50 MG tablet Take 1 tablet (50 mg total) by mouth every 6 (six) hours as needed.  . traZODone (DESYREL) 50 MG tablet Take 1 tablet (50 mg total) by mouth at bedtime.  . [DISCONTINUED] ranitidine (ZANTAC) 300 MG tablet Take 1 tablet (300 mg total) by mouth daily.   No facility-administered encounter medications on file as of 01/05/2020.    Goals Addressed              This Visit's Progress   .  PharmD- Medication Adherence  (pt-stated)        Current Barriers:  . Knowledge deficits related to impact of diet on blood sugar and medications to manage blood sugar . Non Adherence to prescribed medication regimen . Financial Barriers . Lack of blood sugar results for clinical team  Pharmacist Clinical Goal(s):  . Over the next 30 days, patient will work with CM Pharmacist to address needs related to obtaining a blood glucose meter, medication adherence and medication regimen optimization  Interventions: . Perform chart review . Reach patient, but patient states that she is not home. . Discuss medication adherence o Ms. Chiou reports received her Ozempic from [facility] and has restarted taking as directed. Reports also has supply of Toujeo . Reschedule appointment to review medications/review home monitoring results  Patient Self Care Activities:  . Patient to check blood sugars twice daily as directed and keep log . Patient to attend provider appointments as scheduled   Please see past updates related to this goal by clicking on the "Past Updates" button in the selected goal         Plan  Telephone follow up appointment with care management team member scheduled for: 11/3 at 2pm   , PharmD, BCACP Clinical Pharmacist [facility] [phone] 

## 2020-01-05 NOTE — Patient Instructions (Signed)
Thank you allowing the Chronic Care Management Team to be a part of your care! It was a pleasure speaking with you today!     CCM (Chronic Care Management) Team    Noreene Larsson RN, MSN, CCM Nurse Care Coordinator  [phone]   [name] PharmD  Clinical Pharmacist  [phone]   Eula Fried LCSW Clinical Social Worker [phone]  Visit Information  Goals Addressed              This Visit's Progress   .  PharmD- Medication Adherence (pt-stated)        Current Barriers:  [name] Knowledge deficits related to impact of diet on blood sugar and medications to manage blood sugar . Non Adherence to prescribed medication regimen . Financial Barriers . Lack of blood sugar results for clinical team  Pharmacist Clinical Goal(s):  [name] Over the next 30 days, patient will work with CM Pharmacist to address needs related to obtaining a blood glucose meter, medication adherence and medication regimen optimization  Interventions: . Perform chart review . Reach patient, but patient states that she is not home. . Discuss medication adherence o Ms. Lauf reports received her Ozempic from [place] and has restarted taking as directed. Reports also has supply of Toujeo . Reschedule appointment to review medications/review home monitoring results  Patient Self Care Activities:  . Patient to check blood sugars twice daily as directed and keep log . Patient to attend provider appointments as scheduled   Please see past updates related to this goal by clicking on the "Past Updates" button in the selected goal         Patient verbalizes understanding of instructions provided today.   Telephone follow up appointment with care management team member scheduled for: 11/3 at [place], PharmD, [place] [phone]

## 2020-01-17 ENCOUNTER — Telehealth: Payer: Self-pay | Admitting: General Practice

## 2020-01-17 ENCOUNTER — Telehealth: Payer: Medicare Other

## 2020-01-17 NOTE — Telephone Encounter (Signed)
  Chronic Care Management   Outreach Note  01/17/2020 Name: Alyssa Perkins MRN: [id number] DOB: 07/14/1950  Referred by: [name], DO Reason for referral : Chronic Care Management (RNCM Chronic Disease Management and Care Coordination Needs- 2nd attempt)   A second unsuccessful telephone outreach was attempted today. The patient was referred to the case management team for assistance with care management and care coordination.   Follow Up Plan: A HIPAA compliant phone message was left for the patient providing contact information and requesting a return call.   Noreene Larsson RN, MSN, [place] Mobile: [phone]

## 2020-01-19 ENCOUNTER — Ambulatory Visit: Payer: Medicare Other | Admitting: Pharmacist

## 2020-01-19 DIAGNOSIS — E1140 Type 2 diabetes mellitus with diabetic neuropathy, unspecified: Secondary | ICD-10-CM

## 2020-01-19 DIAGNOSIS — Z794 Long term (current) use of insulin: Secondary | ICD-10-CM

## 2020-01-19 NOTE — Patient Instructions (Signed)
Thank you allowing the Chronic Care Management Team to be a part of your care! It was a pleasure speaking with you today!     CCM (Chronic Care Management) Team    Noreene Larsson RN, MSN, CCM Nurse Care Coordinator  [phone]   [name] PharmD  Clinical Pharmacist  [phone]   Eula Fried LCSW Clinical Social Worker [phone]  Visit Information  Goals Addressed              This Visit's Progress   .  PharmD- Medication Adherence (pt-stated)        Current Barriers:  [name] Knowledge deficits related to impact of diet on blood sugar and medications to manage blood sugar . Non Adherence to prescribed medication regimen . Financial Barriers . Lack of blood sugar results for clinical team  Pharmacist Clinical Goal(s):  [name] Over the next 30 days, patient will work with CM Pharmacist to address needs related to obtaining a blood glucose meter, medication adherence and medication regimen optimization  Interventions: . Reach patient as scheduled for second time to review medications and monitoring results, but patient states that she is not home. o Will ask care guide to call to follow up with patient to schedule this appointment . Coordination of care: Advise patient to call Endocrinology office to schedule due 89-month follow up appointment. Patient confirms will call to schedule . Reports home CBGs ranging 130s-180s  Patient Self Care Activities:  . Patient to check blood sugars twice daily as directed and keep log . Patient to attend provider appointments as scheduled   Please see past updates related to this goal by clicking on the "Past Updates" button in the selected goal         Patient verbalizes understanding of instructions provided today.   Will collaborate with Care Guide to outreach to schedule follow up with me  [name], PharmD, [place] [phone]

## 2020-01-19 NOTE — Chronic Care Management (AMB) (Signed)
Chronic Care Management   Follow Up Note   01/19/2020 Name: Alyssa Perkins MRN: [id number] DOB: [birth year]  Referred by: [name], DO Reason for referral : Chronic Care Management (Patient Phone Call)   Alyssa Perkins is a 69 y.o. year old female who is a primary care patient of [name], DO. The CCM team was consulted for assistance with chronic disease management and care coordination needs.    I reached out to [name] by phone today.   Review of patient status, including review of consultants reports, relevant laboratory and other test results, and collaboration with appropriate care team members and the patient's provider was performed as part of comprehensive patient evaluation and provision of chronic care management services.    SDOH (Social Determinants of Health) assessments performed: No See Care Plan activities for detailed interventions related to [facility])     Outpatient Encounter Medications as of 01/19/2020  Medication Sig  . Blood Glucose Monitoring Suppl (ONETOUCH VERIO) w/Device KIT Use to check blood sugar up to 3 x daily  . cyclobenzaprine (FLEXERIL) 5 MG tablet Take 1-2 tablets (5-10 mg total) by mouth 2 (two) times daily as needed for muscle spasms.  . Diclofenac Sodium 1 % CREA Apply 2 g topically 3 (three) times daily as needed.  . dicyclomine (BENTYL) 10 MG capsule Take 1 capsule (10 mg total) by mouth 4 (four) times daily -  before meals and at bedtime. (Patient not taking: Reported on 09/29/2019)  . Elastic Bandages & Supports (MEDICAL COMPRESSION STOCKINGS) MISC 1 each by Does not apply route daily.  . empagliflozin (JARDIANCE) 25 MG TABS tablet Take 1 tablet (25 mg total) by mouth daily with breakfast.  . famotidine (PEPCID) 20 MG tablet Take 1 tablet (20 mg total) by mouth daily. (Patient not taking: Reported on 09/29/2019)  . gabapentin (NEURONTIN) 100 MG capsule Take 2 capsules (200 mg total) by mouth in the  morning, at noon, and at bedtime.  . hydrOXYzine (ATARAX/VISTARIL) 25 MG tablet Take 1 tablet (25 mg total) by mouth daily as needed for itching. For itching  . ibuprofen (ADVIL,MOTRIN) 200 MG tablet Take 600-800 mg by mouth every 6 (six) hours as needed for moderate pain.   . Insulin Pen Needle 31G X 5 MM MISC 1 each by Does not apply route daily. Use to inject Toujeo daily. Dx:E11.9  . mesalamine (LIALDA) 1.2 g EC tablet Take 1 tablet (1.2 g total) by mouth 2 (two) times daily.  [name] omeprazole (PRILOSEC) 20 MG capsule Take 1 capsule (20 mg total) by mouth daily.  . ondansetron (ZOFRAN-ODT) 4 MG disintegrating tablet Take 1 tablet (4 mg total) by mouth every 8 (eight) hours as needed for nausea or vomiting.  [name] Lancets 03J MISC Use to check blood sugar up to 3 x daily  . ONETOUCH VERIO test strip Check blood sugar 3 x daily  . quinapril (ACCUPRIL) 20 MG tablet Take 1 tablet (20 mg total) by mouth at bedtime.  . Semaglutide,0.25 or 0.5MG/DOS, (OZEMPIC, 0.25 OR 0.5 MG/DOSE,) 2 MG/1.5ML SOPN Inject 0.5 mg into the skin once a week.  . sertraline (ZOLOFT) 50 MG tablet Take 1 tablet (50 mg total) by mouth daily.  . simvastatin (ZOCOR) 40 MG tablet Take 1 tablet (40 mg total) by mouth at bedtime.  Nelva Nay SOLOSTAR 300 UNIT/ML Solostar Pen INJECT 76 UNITS SUBCUTANEOUSLY EVERY DAY - (EACH PEN EXPIRES 56 DAYS AFTER FIRST USE)  . traMADol (ULTRAM) 50 MG  tablet Take 1 tablet (50 mg total) by mouth every 6 (six) hours as needed.  . traZODone (DESYREL) 50 MG tablet Take 1 tablet (50 mg total) by mouth at bedtime.  . [DISCONTINUED] ranitidine (ZANTAC) 300 MG tablet Take 1 tablet (300 mg total) by mouth daily.   No facility-administered encounter medications on file as of 01/19/2020.    Goals Addressed              This Visit's Progress   .  PharmD- Medication Adherence (pt-stated)        Current Barriers:  [name] Knowledge deficits related to impact of diet on blood sugar and medications to  manage blood sugar . Non Adherence to prescribed medication regimen . Financial Barriers . Lack of blood sugar results for clinical team  Pharmacist Clinical Goal(s):  [name] Over the next 30 days, patient will work with CM Pharmacist to address needs related to obtaining a blood glucose meter, medication adherence and medication regimen optimization  Interventions: . Reach patient as scheduled for second time to review medications and monitoring results, but patient states that she is not home. o Will ask care guide to call to follow up with patient to schedule this appointment . Coordination of care: Advise patient to call Endocrinology office to schedule due 108-monthfollow up appointment. Patient confirms will call to schedule . Reports home CBGs ranging 130s-180s  Patient Self Care Activities:  . Patient to check blood sugars twice daily as directed and keep log . Patient to attend provider appointments as scheduled   Please see past updates related to this goal by clicking on the "Past Updates" button in the selected goal         Plan  Will collaborate with Care Guide to outreach to schedule follow up with me  EHarlow Asa PharmD, [phone]

## 2020-01-21 NOTE — Progress Notes (Signed)
Pt has been r/s  

## 2020-02-01 NOTE — Telephone Encounter (Signed)
Pt has been r/s  

## 2020-02-02 ENCOUNTER — Ambulatory Visit: Payer: Medicare Other | Admitting: Pharmacist

## 2020-02-02 DIAGNOSIS — E1140 Type 2 diabetes mellitus with diabetic neuropathy, unspecified: Secondary | ICD-10-CM

## 2020-02-02 DIAGNOSIS — Z794 Long term (current) use of insulin: Secondary | ICD-10-CM

## 2020-02-02 DIAGNOSIS — E1169 Type 2 diabetes mellitus with other specified complication: Secondary | ICD-10-CM

## 2020-02-02 NOTE — Chronic Care Management (AMB) (Signed)
Chronic Care Management   Follow Up Note   02/02/2020 Name: MICHEAL SHEEN MRN: [id number] DOB: [birth year]  Referred by: [name], DO Reason for referral : Chronic Care Management (Patient Phone Call)   EMMERY SEILER is a 69 y.o. year old female who is a primary care patient of [name], DO. The CCM team was consulted for assistance with chronic disease management and care coordination needs.    I reached out to [name] by phone today.   Review of patient status, including review of consultants reports, relevant laboratory and other test results, and collaboration with appropriate care team members and the patient's provider was performed as part of comprehensive patient evaluation and provision of chronic care management services.    SDOH (Social Determinants of Health) assessments performed: No See Care Plan activities for detailed interventions related to [facility])     Outpatient Encounter Medications as of 02/02/2020  Medication Sig  . empagliflozin (JARDIANCE) 25 MG TABS tablet Take 25 mg by mouth daily.  . Semaglutide,0.25 or 0.5MG/DOS, (OZEMPIC, 0.25 OR 0.5 MG/DOSE,) 2 MG/1.5ML SOPN Inject 0.5 mg into the skin once a week.  Nelva Nay SOLOSTAR 300 UNIT/ML Solostar Pen INJECT 76 UNITS SUBCUTANEOUSLY EVERY DAY - (EACH PEN EXPIRES 56 DAYS AFTER FIRST USE)  . Blood Glucose Monitoring Suppl (ONETOUCH VERIO) w/Device KIT Use to check blood sugar up to 3 x daily  . cyclobenzaprine (FLEXERIL) 5 MG tablet Take 1-2 tablets (5-10 mg total) by mouth 2 (two) times daily as needed for muscle spasms.  . Diclofenac Sodium 1 % CREA Apply 2 g topically 3 (three) times daily as needed.  . dicyclomine (BENTYL) 10 MG capsule Take 1 capsule (10 mg total) by mouth 4 (four) times daily -  before meals and at bedtime. (Patient not taking: Reported on 09/29/2019)  . Elastic Bandages & Supports (MEDICAL COMPRESSION STOCKINGS) MISC 1 each by Does not apply  route daily.  . famotidine (PEPCID) 20 MG tablet Take 1 tablet (20 mg total) by mouth daily. (Patient not taking: Reported on 09/29/2019)  . gabapentin (NEURONTIN) 100 MG capsule Take 2 capsules (200 mg total) by mouth in the morning, at noon, and at bedtime.  . hydrOXYzine (ATARAX/VISTARIL) 25 MG tablet Take 1 tablet (25 mg total) by mouth daily as needed for itching. For itching  . ibuprofen (ADVIL,MOTRIN) 200 MG tablet Take 600-800 mg by mouth every 6 (six) hours as needed for moderate pain.   . Insulin Pen Needle 31G X 5 MM MISC 1 each by Does not apply route daily. Use to inject Toujeo daily. Dx:E11.9  . mesalamine (LIALDA) 1.2 g EC tablet Take 1 tablet (1.2 g total) by mouth 2 (two) times daily.  [name] omeprazole (PRILOSEC) 20 MG capsule Take 1 capsule (20 mg total) by mouth daily.  . ondansetron (ZOFRAN-ODT) 4 MG disintegrating tablet Take 1 tablet (4 mg total) by mouth every 8 (eight) hours as needed for nausea or vomiting.  [name] Lancets 86V MISC Use to check blood sugar up to 3 x daily  . ONETOUCH VERIO test strip Check blood sugar 3 x daily  . quinapril (ACCUPRIL) 20 MG tablet Take 1 tablet (20 mg total) by mouth at bedtime.  . sertraline (ZOLOFT) 50 MG tablet Take 1 tablet (50 mg total) by mouth daily.  . simvastatin (ZOCOR) 40 MG tablet Take 1 tablet (40 mg total) by mouth at bedtime.  . traMADol (ULTRAM) 50 MG tablet Take 1 tablet (50  mg total) by mouth every 6 (six) hours as needed.  . traZODone (DESYREL) 50 MG tablet Take 1 tablet (50 mg total) by mouth at bedtime.  . [DISCONTINUED] ranitidine (ZANTAC) 300 MG tablet Take 1 tablet (300 mg total) by mouth daily.   No facility-administered encounter medications on file as of 02/02/2020.    Goals Addressed              This Visit's Progress   .  PharmD- Medication Adherence (pt-stated)        Current Barriers:  [name] Knowledge deficits related to impact of diet on blood sugar and medications to manage blood sugar . Non  Adherence to prescribed medication regimen . Financial Barriers . Lack of blood sugar results for clinical team  Pharmacist Clinical Goal(s):  [name] Over the next 30 days, patient will work with CM Pharmacist to address needs related to obtaining a blood glucose meter, medication adherence and medication regimen optimization  Interventions: . Patient declines to complete medication review today. . Discuss importance of medication adherence o Reports not taking medications, aside from diabetes medication regimen, consistently o Reports that she wants to work on improving adherence in the future. States that she is going to [place] and would like to discuss/work on this further when she returns - Schedule appointment to follow up about medication adherence/review medications as requested - Reports has all of her medications and planning to take these along with weekly pillbox with her when she travels . Discuss importance of blood sugar monitoring and control o Reports currently taking:  Toujeo 76 units once daily  Jardiance 25 mg once daily  Ozempic 0.5 mg weekly (on Fridays) o Recalls recent home CBG results ~160s-170s  Reports has packed up her blood sugar monitor o Discuss importance of having monitor accessible and continuing to check blood sugars fasting each morning and again before bed as directed by Endocrinologist . Coordination of care: Again advise patient to call Endocrinology office to schedule due 42-monthfollow up appointment.    Patient Self Care Activities:  . Patient to check blood sugars twice daily as directed and keep log . Patient to attend provider appointments as scheduled   Please see past updates related to this goal by clicking on the "Past Updates" button in the selected goal         Plan  Telephone follow up appointment with care management team member scheduled for: 03/22/2020 at 9:45 am  EHarlow Asa PharmD, [phone]

## 2020-02-02 NOTE — Patient Instructions (Signed)
Thank you allowing the Chronic Care Management Team to be a part of your care! It was a pleasure speaking with you today!     CCM (Chronic Care Management) Team    Noreene Larsson RN, MSN, CCM Nurse Care Coordinator  [phone]   [name] PharmD  Clinical Pharmacist  [phone]   Eula Fried LCSW Clinical Social Worker [phone]  Visit Information  Goals Addressed              This Visit's Progress   .  PharmD- Medication Adherence (pt-stated)        Current Barriers:  [name] Knowledge deficits related to impact of diet on blood sugar and medications to manage blood sugar . Non Adherence to prescribed medication regimen . Financial Barriers . Lack of blood sugar results for clinical team  Pharmacist Clinical Goal(s):  [name] Over the next 30 days, patient will work with CM Pharmacist to address needs related to obtaining a blood glucose meter, medication adherence and medication regimen optimization  Interventions: . Patient declines to complete medication review today. . Discuss importance of medication adherence o Reports not taking medications, aside from diabetes medication regimen, consistently o Reports that she wants to work on improving adherence in the future. States that she is going to [place] and would like to discuss/work on this further when she returns - Schedule appointment to follow up about medication adherence/review medications as requested - Reports has all of her medications and planning to take these along with weekly pillbox with her when she travels . Discuss importance of blood sugar monitoring and control o Reports currently taking:  Toujeo 76 units once daily  Jardiance 25 mg once daily  Ozempic 0.5 mg weekly (on Fridays) o Recalls recent home CBG results ~160s-170s  Reports has packed up her blood sugar monitor o Discuss importance of having monitor accessible and continuing to check blood sugars fasting each morning and again  before bed as directed by Endocrinologist . Coordination of care: Again advise patient to call Endocrinology office to schedule due 7-month follow up appointment.    Patient Self Care Activities:  . Patient to check blood sugars twice daily as directed and keep log . Patient to attend provider appointments as scheduled   Please see past updates related to this goal by clicking on the "Past Updates" button in the selected goal         The patient verbalized understanding of instructions, educational materials, and care plan provided today and declined offer to receive copy of patient instructions, educational materials, and care plan.   Telephone follow up appointment with care management team member scheduled for: 03/22/2020 at 9:45 am  [name], PharmD, [place] [phone]

## 2020-03-13 ENCOUNTER — Ambulatory Visit: Payer: Self-pay

## 2020-03-13 NOTE — Telephone Encounter (Signed)
Returned call to patient.  She called for appointment.  She states shje is coming back home after a stay with her daughter.  She states that she has rash to her rt hand and arm that is real itchy.  She states she has scratched till it bleeds.  She has taken her hydroxyzine and used a hydrocortizone cream to control symptoms. She has bee exposed to a different detergent being at her daughters.  She also states that she get dizzy and nauseated and has blurred vision from fall Nov 21st befor traveling to her daughters.  She states she get dizzy/lightheaded when she stands  Her left eye vision is blurry.  She states she fell down stairs and hit her left side of head just above her temple. Per protocol patient was told these symptoms need evaluated by UC in the area.  She verbalized understanding and said she would talk to her daughter.  Reason for Disposition . [1] Age over 75 years AND [2] swelling or bruise  Answer Assessment - Initial Assessment Questions 1. DESCRIPTION: "Describe the itching you are having." "Where is it located?"     Rt hand and arm 2. SEVERITY: "How bad is it?"    - MILD - doesn't interfere with normal activities   - MODERATE-SEVERE: interferes with work, school, sleep, or other activities     severe 3. SCRATCHING: "Are there any scratch marks? Bleeding?"     Some bleeding 4. ONSET: "When did the itching begin?"     1 week ago 5. CAUSE: "What do you think is causing the itching?"     Soap has changed at daughters out of town  6. OTHER SYMPTOMS: "Do you have any other symptoms?"      Hand is swollen dizzy and nauseated from fall at sons Nov 20th 7. PREGNANCY: "Is there any chance you are pregnant?" "When was your last menstrual period?"    N/A  Answer Assessment - Initial Assessment Questions 1. MECHANISM: "How did the injury happen?" For falls, ask: "What height did you fall from?" and "What surface did you fall against?"      Down a flight of stairs in Nov 21 2. ONSET:  "When did the injury happen?" (Minutes or hours ago)     Nov 21st 3. NEUROLOGIC SYMPTOMS: "Was there any loss of consciousness?" "Are there any other neurological symptoms?"      lighheaded dizzy nausea 4. MENTAL STATUS: "Does the person know who he is, who you are, and where he is?"      ok 5. LOCATION: "What part of the head was hit?"      Left temple area just above where hair line starts 6. SCALP APPEARANCE: "What does the scalp look like? Is it bleeding now?" If Yes, ask: "Is it difficult to stop?"     no 7. SIZE: For cuts, bruises, or swelling, ask: "How large is it?" (e.g., inches or centimeters)      No  8. PAIN: "Is there any pain?" If Yes, ask: "How bad is it?"  (e.g., Scale 1-10; or mild, moderate, severe)    Saw stars 9. TETANUS: For any breaks in the skin, ask: "When was the last tetanus booster?"  N/A 10. OTHER SYMPTOMS: "Do you have any other symptoms?" (e.g., neck pain, vomiting)      nauseated  Blurred vision left eye 11. PREGNANCY: "Is there any chance you are pregnant?" "When was your last menstrual period?" N/A  Protocols used: HEAD INJURY-A-AH, ITCHING - LOCALIZED-A-AH

## 2020-03-20 ENCOUNTER — Other Ambulatory Visit: Payer: Self-pay | Admitting: Family Medicine

## 2020-03-20 DIAGNOSIS — L299 Pruritus, unspecified: Secondary | ICD-10-CM

## 2020-03-20 DIAGNOSIS — M5412 Radiculopathy, cervical region: Secondary | ICD-10-CM

## 2020-03-20 DIAGNOSIS — F3289 Other specified depressive episodes: Secondary | ICD-10-CM

## 2020-03-20 DIAGNOSIS — E1140 Type 2 diabetes mellitus with diabetic neuropathy, unspecified: Secondary | ICD-10-CM

## 2020-03-20 DIAGNOSIS — IMO0002 Reserved for concepts with insufficient information to code with codable children: Secondary | ICD-10-CM

## 2020-03-20 DIAGNOSIS — G5603 Carpal tunnel syndrome, bilateral upper limbs: Secondary | ICD-10-CM

## 2020-03-20 DIAGNOSIS — I10 Essential (primary) hypertension: Secondary | ICD-10-CM

## 2020-03-20 DIAGNOSIS — E1165 Type 2 diabetes mellitus with hyperglycemia: Secondary | ICD-10-CM

## 2020-03-20 DIAGNOSIS — K219 Gastro-esophageal reflux disease without esophagitis: Secondary | ICD-10-CM

## 2020-03-20 MED ORDER — GABAPENTIN 100 MG PO CAPS: 200.0000 mg | ORAL_CAPSULE | Freq: Three times a day (TID) | ORAL | 0 refills | Status: DC

## 2020-03-20 MED ORDER — TOUJEO SOLOSTAR 300 UNIT/ML ~~LOC~~ SOPN: PEN_INJECTOR | SUBCUTANEOUS | 0 refills | Status: AC

## 2020-03-20 MED ORDER — ONETOUCH VERIO VI STRP: ORAL_STRIP | 5 refills | Status: AC

## 2020-03-20 MED ORDER — HYDROXYZINE HCL 25 MG PO TABS: 25.0000 mg | ORAL_TABLET | Freq: Every day | ORAL | 0 refills | Status: DC | PRN

## 2020-03-20 MED ORDER — OMEPRAZOLE 20 MG PO CPDR: 20.0000 mg | DELAYED_RELEASE_CAPSULE | Freq: Every day | ORAL | 0 refills | Status: DC

## 2020-03-20 MED ORDER — QUINAPRIL HCL 20 MG PO TABS: 20.0000 mg | ORAL_TABLET | Freq: Every day | ORAL | 0 refills | Status: DC

## 2020-03-20 MED ORDER — SERTRALINE HCL 50 MG PO TABS: 50.0000 mg | ORAL_TABLET | Freq: Every day | ORAL | 0 refills | Status: DC

## 2020-03-20 MED ORDER — TRAZODONE HCL 50 MG PO TABS: 50.0000 mg | ORAL_TABLET | Freq: Every day | ORAL | 0 refills | Status: DC

## 2020-03-20 NOTE — Telephone Encounter (Signed)
Please review for refill. Jardiance previously filled by a different provider.

## 2020-03-20 NOTE — Telephone Encounter (Signed)
Medication Refill - Medication: Jardiance 25 mg, trazodone 50 mg , gabapentin 100 mg, omeprazole 20 mg, quinapril 20 mg, sertraline 50 mg , toujeo solostar pens, one touch verio test strip and hydroxyzine #90 day supply with refills Has the patient contacted their pharmacy? Yes. Pt said the pharm told her all her rxs has expired Preferred Pharmacy (with phone number or street name): [facility] Agent: Please be advised that RX refills may take up to 3 business days. We ask that you follow-up with your pharmacy.

## 2020-03-20 NOTE — Telephone Encounter (Signed)
Please review for refill. Jardiance last ordered by another provider. Appt scheduled on 03/21/20.

## 2020-03-21 ENCOUNTER — Ambulatory Visit (INDEPENDENT_AMBULATORY_CARE_PROVIDER_SITE_OTHER): Payer: Medicare Other | Admitting: Family Medicine

## 2020-03-21 ENCOUNTER — Encounter: Payer: Self-pay | Admitting: Family Medicine

## 2020-03-21 ENCOUNTER — Other Ambulatory Visit: Payer: Self-pay

## 2020-03-21 VITALS — BP 133/68 | HR 88 | Temp 96.6°F | Resp 16 | Ht 63.0 in | Wt 200.0 lb

## 2020-03-21 DIAGNOSIS — F0781 Postconcussional syndrome: Secondary | ICD-10-CM

## 2020-03-21 DIAGNOSIS — L282 Other prurigo: Secondary | ICD-10-CM

## 2020-03-21 DIAGNOSIS — E1140 Type 2 diabetes mellitus with diabetic neuropathy, unspecified: Secondary | ICD-10-CM

## 2020-03-21 DIAGNOSIS — G8929 Other chronic pain: Secondary | ICD-10-CM | POA: Diagnosis not present

## 2020-03-21 DIAGNOSIS — M5412 Radiculopathy, cervical region: Secondary | ICD-10-CM

## 2020-03-21 DIAGNOSIS — L219 Seborrheic dermatitis, unspecified: Secondary | ICD-10-CM | POA: Diagnosis not present

## 2020-03-21 DIAGNOSIS — G44221 Chronic tension-type headache, intractable: Secondary | ICD-10-CM

## 2020-03-21 DIAGNOSIS — G44329 Chronic post-traumatic headache, not intractable: Secondary | ICD-10-CM

## 2020-03-21 DIAGNOSIS — M545 Low back pain, unspecified: Secondary | ICD-10-CM | POA: Diagnosis not present

## 2020-03-21 DIAGNOSIS — I10 Essential (primary) hypertension: Secondary | ICD-10-CM | POA: Diagnosis not present

## 2020-03-21 DIAGNOSIS — Z794 Long term (current) use of insulin: Secondary | ICD-10-CM

## 2020-03-21 MED ORDER — JARDIANCE 25 MG PO TABS: 25.0000 mg | ORAL_TABLET | Freq: Every day | ORAL | 0 refills | Status: DC

## 2020-03-21 MED ORDER — TRAMADOL HCL 50 MG PO TABS: 50.0000 mg | ORAL_TABLET | Freq: Four times a day (QID) | ORAL | 1 refills | Status: DC | PRN

## 2020-03-21 NOTE — Progress Notes (Signed)
Subjective:    Patient ID: Alyssa Perkins, female    DOB: [birth year], 70 y.o.   MRN: [id number]  Alyssa Perkins is a 70 y.o. female presenting on 03/21/2020 for Fall (01/2020) and Concussion (Had fall in 01/2020 but still feeling Symptoms and hand pain on right side )  She was in [place] since 01/2020, now just returned this past weekend.  HPI   Pruritic Rash Itching referral, scalp R arm mostly with areas of chronic recurrent episodic itching and redness swelling possible urticarial rash. She has had some skin sores that were causing itching, now no longer has any. Scab area from scratching. She has dry flaking skin on scalp with itching and some hair loss as well. - taking Hydroxyzine PRN.  Requested all meds refilled recently.  Post Concussion Syndrome / Headache / Neck Pain Prior history of chronic headaches in past. Fall 02/05/20 then she was in [place], she had head injury possible concussion at that time and has had persistent neck pain. She has still some reduced range of motion with neck. Has had some persistent headaches behind eyes. She has taken Tramadol in past with temporary relief, due for refill now last was ordered 10/2019.  Type 2 Diabetes Followed by [facility] Last A1c 7.3 (10/14/19) Due for DM Foot exam today. Needs meds refilled.   Depression screen [facility] 2/9 07/20/2019 05/10/2019 03/31/2019  Decreased Interest 0 3 3  Down, Depressed, Hopeless 1 1 1   PHQ - 2 Score 1 4 4   Altered sleeping - 3 3  Tired, decreased energy - 3 3  Change in appetite - 1 1  Feeling bad or failure about yourself  - 0 0  Trouble concentrating - 3 3  Moving slowly or fidgety/restless - 0 0  Suicidal thoughts - 0 0  PHQ-9 Score - 14 14  Difficult doing work/chores - Somewhat difficult Somewhat difficult  Some recent data might be hidden    Social History   Tobacco Use  . Smoking status: Former Smoker    Packs/day: 0.50    Years: 15.00    Pack years: 7.50     Types: Cigarettes    Quit date: 07/17/1994    Years since quitting: 25.6  . Smokeless tobacco: Former  . Tobacco comment: stopped 18 years ago  Vaping Use  . Vaping Use: Never used  Substance Use Topics  . Alcohol use: No  . Drug use: No    Review of Systems Per HPI unless specifically indicated above     Objective:    BP 133/68   Pulse 88   Temp (!) 96.6 F (35.9 C)   Resp 16   Ht 5\' 3"  (1.6 m)   Wt 200 lb (90.7 kg)   SpO2 100%   BMI 35.43 kg/m   Wt Readings from Last 3 Encounters:  03/21/20 200 lb (90.7 kg)  11/17/19 205 lb 9.6 oz (93.3 kg)  09/30/19 199 lb (90.3 kg)    Physical Exam Vitals and nursing note reviewed.  Constitutional:      General: She is not in acute distress.    Appearance: She is well-developed and well-nourished. She is not diaphoretic.     Comments: Well-appearing, comfortable, cooperative  HENT:     Head: Normocephalic and atraumatic.     Mouth/Throat:     Mouth: Oropharynx is clear and moist.  Eyes:     General:        Right eye: No discharge.  Left eye: No discharge.     Conjunctiva/sclera: Conjunctivae normal.  Cardiovascular:     Rate and Rhythm: Normal rate.  Pulmonary:     Effort: Pulmonary effort is normal.  Musculoskeletal:        General: No edema.  Skin:    General: Skin is warm and dry.     Findings: Rash (multiple areas on R arm residual possible urticarial rash now resolving , areas where she has scratched. Scalp with some dry flaky skin.) present. No erythema.  Neurological:     Mental Status: She is alert and oriented to person, place, and time.  Psychiatric:        Mood and Affect: Mood and affect normal.        Behavior: Behavior normal.     Comments: Well groomed, good eye contact, normal speech and thoughts      Diabetic Foot Exam - Simple   Simple Foot Form Diabetic Foot exam was performed with the following findings: Yes 03/21/2020  2:50 PM  Visual Inspection See comments: Yes Sensation  Testing Intact to touch and monofilament testing bilaterally: Yes Pulse Check Posterior Tibialis and Dorsalis pulse intact bilaterally: Yes Comments Dry skin dermatitis with callus formation bilateral. S/p R foot 2nd toe amputation. Well healed.     Results for orders placed or performed in visit on 12/17/19  HM DIABETES EYE EXAM  Result Value Ref Range   HM Diabetic Eye Exam Retinopathy (A) No Retinopathy      Assessment & Plan:   Problem List Items Addressed This Visit    Type 2 diabetes mellitus with diabetic neuropathy, with long-term current use of insulin (HCC) - Primary   Post concussion syndrome   Relevant Orders   Ambulatory referral to Neurology   Chronic headaches   Relevant Medications   traMADol (ULTRAM) 50 MG tablet   Other Relevant Orders   Ambulatory referral to Neurology   Chronic back pain   Relevant Medications   traMADol (ULTRAM) 50 MG tablet   Cervical radiculopathy   Relevant Medications   traMADol (ULTRAM) 50 MG tablet    Other Visit Diagnoses    Essential hypertension       Pruritic rash       Relevant Orders   Ambulatory referral to Dermatology   Seborrheic dermatitis of scalp       Relevant Orders   Ambulatory referral to Dermatology       #Pruritic rash / possible urticarial rash Chronic problem recurrent, question if cause is medication or primary rash, also has scalp dermatitis and hair loss associated with similar rash Will refer to Dermatology for evaluation of her rash and the scalp issue. May use hydroxyzine PRN for itching and topical cortisone as need.  #Refill medications accordingly  #Chronic Back pain / Headache / Neck Pain Renew Tramadol, reviewed PDMP. Uses PRN only.  # Post Concussive Syndrome / Fall Injury Will refer to [facility] for consultation from recent fall 3 months ago with head injury and residual headache / neck pain, in past has known C spine DJD / chronic headaches, complicating picture, may warrant  further head imaging or other management. - Re order Tramadol PRN pain   #T2DM Followed by [facility] endocrinology DM Foot exam done today Entered A1c from 09/2019  Orders Placed This Encounter  Procedures  . Ambulatory referral to Neurology    Referral Priority:   Routine    Referral Type:   Consultation    Referral Reason:   Specialty  Services Required    Requested Specialty:   Neurology    Number of Visits Requested:   1  . Ambulatory referral to Dermatology    Referral Priority:   Routine    Referral Type:   Consultation    Referral Reason:   Specialty Services Required    Requested Specialty:   Dermatology    Number of Visits Requested:   1     Meds ordered this encounter  Medications  . traMADol (ULTRAM) 50 MG tablet    Sig: Take 1 tablet (50 mg total) by mouth every 6 (six) hours as needed.    Dispense:  30 tablet    Refill:  1      Follow up plan: Return in about 3 months (around 06/19/2020) for 3 month follow-up Headache, DM, HTN.  Saralyn Pilar, DO [facility] 03/21/2020, 2:35 PM

## 2020-03-21 NOTE — Patient Instructions (Addendum)
Thank you for coming to the office today.  Referral to Neurology and to Dermatology - stay tuned for apt.  Refilled Tramadol for headache as needed  Refills in 1-2 days  Please schedule a Follow-up Appointment to: Return in about 3 months (around 06/19/2020) for 3 month follow-up Headache, DM, HTN.  If you have any other questions or concerns, please feel free to call the office or send a message through MyChart. You may also schedule an earlier appointment if necessary.  Additionally, you may be receiving a survey about your experience at our office within a few days to 1 week by e-mail or mail. We value your feedback.  Saralyn Pilar, DO [facility], [place]

## 2020-03-22 ENCOUNTER — Telehealth: Payer: Self-pay | Admitting: Pharmacist

## 2020-03-22 ENCOUNTER — Telehealth: Payer: Self-pay

## 2020-03-22 NOTE — Telephone Encounter (Signed)
  Chronic Care Management   Outreach Note  03/22/2020 Name: Alyssa Perkins MRN: [id number] DOB: Sep 10, 1950  Referred by: Smitty Cords, DO Reason for referral : No chief complaint on file.   Was unable to reach patient via telephone today and have left HIPAA compliant voicemail asking patient to return my call.   Follow Up Plan: Will collaborate with Care Guide to outreach to schedule follow up with me  Duanne Moron, PharmD, [facility] Clinical Pharmacist [facility] Care Management [phone]

## 2020-03-27 ENCOUNTER — Telehealth: Payer: Medicare Other

## 2020-03-27 ENCOUNTER — Telehealth: Payer: Self-pay | Admitting: General Practice

## 2020-03-27 NOTE — Telephone Encounter (Cosign Needed)
  Chronic Care Management   Outreach Note  03/27/2020 Name: Alyssa Perkins MRN: [id number] DOB: [birth year]  Referred by: [name], DO Reason for referral : Appointment (RNCM Follow up call:3rd attempt for Chronic Disease Management and Care Coordination Needs )   Third unsuccessful telephone outreach was attempted today. The patient was referred to the case management team for assistance with care management and care coordination. The patient's primary care provider has been notified of our unsuccessful attempts to make or maintain contact with the patient. The care management team is pleased to engage with this patient at any time in the future should he/she be interested in assistance from the care management team.   Follow Up Plan: A HIPAA compliant phone message was left for the patient providing contact information and requesting a return call.   Noreene Larsson RN, MSN, [place] Mobile: [phone]

## 2020-03-29 ENCOUNTER — Telehealth: Payer: Self-pay

## 2020-03-29 NOTE — Telephone Encounter (Signed)
Copied from [place] [phone]. Topic: Referral - Status >> Mar 29, 2020 11:48 AM [name] wrote: Reason for CRM: Pt stated the office in which the referral was sent does not accept her insurance. Pt requests referral to a Dermatologist that accept her insurance

## 2020-03-29 NOTE — Telephone Encounter (Signed)
Can we switch to other Dermatology office?  [facility] or [facility]?  Nobie Putnam, [facility] 03/29/2020, 5:53 PM

## 2020-03-30 DIAGNOSIS — J069 Acute upper respiratory infection, unspecified: Secondary | ICD-10-CM | POA: Diagnosis not present

## 2020-03-30 DIAGNOSIS — Z20822 Contact with and (suspected) exposure to covid-19: Secondary | ICD-10-CM | POA: Diagnosis not present

## 2020-03-31 ENCOUNTER — Telehealth: Payer: Self-pay

## 2020-03-31 NOTE — Chronic Care Management (AMB) (Signed)
  Care Management   Note  03/31/2020 Name: Alyssa Perkins MRN: [id number] DOB: October 14, 1950  Alyssa Perkins is a 70 y.o. year old female who is a primary care patient of [name], DO and is actively engaged with the care management team. I reached out to [name] by phone today to assist with re-scheduling a follow up visit with the Pharmacist  Follow up plan: Unsuccessful telephone outreach attempt made. A HIPAA compliant phone message was left for the patient providing contact information and requesting a return call.  The care management team will reach out to the patient again over the next 7 days.  If patient returns call to provider office, please advise to call [place]  at [place], [place], [place], [place] [address] Direct Dial: [phone] [name] Website: [email or web address]

## 2020-04-05 ENCOUNTER — Telehealth: Payer: Self-pay

## 2020-04-05 NOTE — Telephone Encounter (Signed)
Called [facility] and notified them of fax coming through. I will call them back tomorrow morning to verify that they received it.

## 2020-04-05 NOTE — Telephone Encounter (Signed)
Copied from [place] [phone]. Topic: Referral - Status >> Apr 04, 2020  4:34 PM Erick Blinks wrote: Reason for CRM: Pt called for status update on neurology referral. Please advise   Best contact: [phone]

## 2020-04-05 NOTE — Chronic Care Management (AMB) (Signed)
  Care Management   Note  04/05/2020 Name: Alyssa Perkins MRN: [id number] DOB: [birth year]  Alyssa Perkins is a 70 y.o. year old female who is a primary care patient of [name], DO and is actively engaged with the care management team. I reached out to [name] by phone today to assist with re-scheduling a follow up visit with the Pharmacist  Follow up plan: Telephone appointment with care management team member scheduled for:04/17/2020  Noreene Larsson, [place], [place], [place] [address] Direct Dial: [phone] [email or web address]: [email or web address]

## 2020-04-05 NOTE — Telephone Encounter (Signed)
Patient has been rescheduled.

## 2020-04-10 ENCOUNTER — Telehealth: Payer: Self-pay | Admitting: Family Medicine

## 2020-04-10 NOTE — Telephone Encounter (Signed)
Patient is calling to request the referral for neurology. [phone] It does not look at if the patient was referred to a dr.

## 2020-04-11 NOTE — Telephone Encounter (Signed)
I spoke with Dr Lannie Fields staff at [facility]. They have received the referral and said they would be calling the patient.

## 2020-04-17 ENCOUNTER — Telehealth: Payer: Medicare Other

## 2020-04-24 ENCOUNTER — Encounter: Payer: Self-pay | Admitting: Gastroenterology

## 2020-04-24 ENCOUNTER — Ambulatory Visit (INDEPENDENT_AMBULATORY_CARE_PROVIDER_SITE_OTHER): Payer: Medicare Other | Admitting: Gastroenterology

## 2020-04-24 ENCOUNTER — Other Ambulatory Visit: Payer: Self-pay

## 2020-04-24 VITALS — BP 98/67 | HR 80 | Temp 98.2°F | Ht 63.0 in | Wt 194.2 lb

## 2020-04-24 DIAGNOSIS — E739 Lactose intolerance, unspecified: Secondary | ICD-10-CM | POA: Diagnosis not present

## 2020-04-24 NOTE — Patient Instructions (Signed)
Creon Take 2 capsules with first bite of each meal and one capsule first bite of each snack

## 2020-04-24 NOTE — Progress Notes (Signed)
Cephas Darby, MD [address]  [place]  [place], [place] [address]  Main: [phone]  Fax: [phone]    Gastroenterology Consultation  Referring Provider:     Nobie Putnam * Primary Care Physician:  [name], DO Primary Gastroenterologist:  Dr. Cephas Darby Reason for Consultation:     Chronic diarrhea, weight loss        HPI:   Alyssa Perkins is a 70 y.o. female referred by Dr. Parks Ranger, Devonne Doughty, DO  for consultation & management of chronic diarrhea, weight loss  Chronic diarrhea: Patient reports that for last 1 year, she has been experiencing nonbloody loose bowel movements, 3-4 times during the day and 1 or 2 times at night.  It is associated with weight loss.  Generally does not eat 3 meals a day, does not have good appetite at baseline.  She does report lower abdominal cramps as well as abdominal bloating.  She denies any rectal bleeding, nausea or vomiting.  Patient does not identify any food triggers.  She does report her diarrhea is mostly postprandial.  She did not undergo stool studies to rule out infection.  She said she tried Imodium which temporarily helped.  She does acknowledge stress in her life and is planning to move to [place].  Patient is accompanied by her sister today She has history of metabolic syndrome, on insulin  Chronic GERD: Patient reports severe heartburn after she eats.  She takes over-the-counter Pepcid and Prilosec as needed.  She denies nocturnal heartburn, difficulty swallowing, nausea or vomiting  Follow-up visit 11/17/2019 Patient had elevated fecal calprotectin levels.  I have empirically started her on mesalamine which did not help.  She underwent colonoscopy including biopsies, did not reveal evidence of IBD.  She underwent repeat stool studies due to worsening of diarrhea, negative for infection.  She also reports diffuse abdominal pain predominantly central and lower abdomen associated with  abdominal bloating and intermittent episodes of nonbloody diarrhea.  Follow-up visit 04/24/2020 Patient reports that her diarrhea has significantly improved since taking lactobacillus probiotics.  Her diabetes is not well controlled yet, takes Imodium as needed.  She does report abdominal cramps.  She is moving to [place] in less than a month, therefore wanted to have a follow-up visit.  Celiac disease panel negative, pancreatic fecal elastase levels normal, elevated fecal calprotectin levels 210  She does not smoke or drink alcohol  NSAIDs: None  Antiplts/Anticoagulants/Anti thrombotics: None  GI Procedures: Colonoscopy in 03/2010 Upper endoscopy more than 10 years ago  Upper endoscopy and colonoscopy 09/29/2019 - Normal esophagus. - Normal stomach. - Normal examined duodenum. - No specimens collected.  - Preparation of the colon was poor. - Normal mucosa in the entire examined colon. Biopsied. - A few small polyps in the transverse colon and in the ascending colon. Resection not attempted. - The distal rectum and anal verge are normal on retroflexion view.  DIAGNOSIS:  A. RIGHT COLON; COLD BIOPSY:  - COLONIC MUCOSA WITH INTACT CRYPT ARCHITECTURE.  - NEGATIVE FOR MICROSCOPIC COLITIS, DYSPLASIA, AND MALIGNANCY.   B. LEFT COLON; COLD BIOPSY:  - COLONIC MUCOSA WITH INTACT CRYPT ARCHITECTURE.  - NEGATIVE FOR MICROSCOPIC COLITIS, DYSPLASIA, AND MALIGNANCY.   Colonoscopy 09/30/2019 - One 12 mm polyp in the ascending colon, removed with mucosal resection. Resected and retrieved. Clips (MR conditional) were placed. - One 4 mm polyp in the transverse colon, removed with a cold snare. Resected and retrieved. - Diverticulosis in the sigmoid colon. - Non-bleeding  external hemorrhoids.  DIAGNOSIS:  A. COLON POLYP, ASCENDING; HOT SNARE:  - TUBULAR ADENOMA, 2 FRAGMENTS.  - NEGATIVE FOR HIGH-GRADE DYSPLASIA AND MALIGNANCY.   B. COLON POLYP, TRANSVERSE; COLD SNARE:  - TUBULAR  ADENOMA.  - NEGATIVE FOR HIGH-GRADE DYSPLASIA AND MALIGNANCY.   Past Medical History:  Diagnosis Date  . Anginal pain ([place])   . Anxiety    not on any medication at this time  . Arthritis   . Asthma    "sports asthma or exercised induced"seen at [facility] for primary  . Chronic back pain   . Chronic neck pain   . Complication of anesthesia    "difficulty waking up from surgery"  . Depression    has OCD, states counts constantly  . Diabetes mellitus without complication ([place])   . GERD (gastroesophageal reflux disease)   . Headache(784.0)    hx of migraines  . Hyperlipidemia   . IBS (irritable bowel syndrome)   . PONV (postoperative nausea and vomiting)   . RLS (restless legs syndrome)   . Sleep apnea   . Thyroid disease     Past Surgical History:  Procedure Laterality Date  . ABDOMINAL HYSTERECTOMY     also had oophorectomy  . AMPUTATION TOE Right 09/05/2017   Procedure: AMPUTATION TOE-RIGHT 2ND TOE;  Surgeon: Sharlotte Alamo, DPM;  Location: [facility];  Service: Podiatry;  Laterality: Right;  . ANTERIOR CERVICAL DECOMP/DISCECTOMY FUSION  01/02/2012   Procedure: ANTERIOR CERVICAL DECOMPRESSION/DISCECTOMY FUSION 2 LEVELS;  Surgeon: Melina Schools, MD;  Location: [facility];  Service: Orthopedics;  Laterality: N/A;  ACDF C5-7  . BILATERAL CARPAL TUNNEL RELEASE    . CARDIAC CATHETERIZATION     done approx. 4- 5 years ago, Dr. Leanor Kail, does not see cardi. at this time.  . CHOLECYSTECTOMY    . COLONOSCOPY WITH PROPOFOL N/A 09/29/2019   Procedure: COLONOSCOPY WITH PROPOFOL;  Surgeon: Lin Landsman, MD;  Location: [facility] ENDOSCOPY;  Service: Gastroenterology;  Laterality: N/A;  . COLONOSCOPY WITH PROPOFOL N/A 09/30/2019   Procedure: COLONOSCOPY WITH PROPOFOL;  Surgeon: Lin Landsman, MD;  Location: [facility] ENDOSCOPY;  Service: Gastroenterology;  Laterality: N/A;  . DILATION AND CURETTAGE OF UTERUS     "several"  . ESOPHAGOGASTRODUODENOSCOPY (EGD) WITH PROPOFOL  N/A 09/29/2019   Procedure: ESOPHAGOGASTRODUODENOSCOPY (EGD) WITH PROPOFOL;  Surgeon: Lin Landsman, MD;  Location: [place];  Service: Gastroenterology;  Laterality: N/A;  . GIVENS CAPSULE STUDY N/A 12/01/2019   Procedure: GIVENS CAPSULE STUDY;  Surgeon: Lin Landsman, MD;  Location: [facility] ENDOSCOPY;  Service: Gastroenterology;  Laterality: N/A;  . HERNIA REPAIR    . IRRIGATION AND DEBRIDEMENT FOOT  09/05/2017   Procedure: IRRIGATION AND DEBRIDEMENT FOOT;  Surgeon: Sharlotte Alamo, DPM;  Location: [facility] ORS;  Service: Podiatry;;  . KNEE SURGERY     1 partial left, 1 total left  . SPINE SURGERY      Current Outpatient Medications:  .  Blood Glucose Monitoring Suppl (ONETOUCH VERIO) w/Device KIT, Use to check blood sugar up to 3 x daily, Disp: 1 kit, Rfl: 0 .  Diclofenac Sodium 1 % CREA, Apply 2 g topically 3 (three) times daily as needed., Disp: 120 g, Rfl: 0 .  dicyclomine (BENTYL) 10 MG capsule, Take 1 capsule (10 mg total) by mouth 4 (four) times daily -  before meals and at bedtime., Disp: 90 capsule, Rfl: 0 .  Elastic Bandages & Supports ([place]) MISC, 1 each by Does not apply route daily., Disp:  2 each, Rfl: 0 .  esomeprazole (NEXIUM) 20 MG capsule, Take 20 mg by mouth daily at 12 noon., Disp: , Rfl:  .  gabapentin (NEURONTIN) 100 MG capsule, Take 2 capsules (200 mg total) by mouth in the morning, at noon, and at bedtime., Disp: 540 capsule, Rfl: 0 .  hydrOXYzine (ATARAX/VISTARIL) 25 MG tablet, Take 1 tablet (25 mg total) by mouth daily as needed for itching. For itching, Disp: 90 tablet, Rfl: 0 .  ibuprofen (ADVIL,MOTRIN) 200 MG tablet, Take 600-800 mg by mouth every 6 (six) hours as needed for moderate pain. , Disp: , Rfl:  .  insulin glargine, 1 Unit Dial, (TOUJEO SOLOSTAR) 300 UNIT/ML Solostar Pen, INJECT 76 UNITS SUBCUTANEOUSLY EVERY DAY - (EACH PEN EXPIRES 56 DAYS AFTER FIRST USE), Disp: 15 mL, Rfl: 0 .  Insulin Pen Needle 31G X 5 MM MISC, 1 each by  Does not apply route daily. Use to inject Toujeo daily. Dx:E11.9, Disp: 100 each, Rfl: 0 .  JARDIANCE 25 MG TABS tablet, Take 1 tablet (25 mg total) by mouth daily., Disp: 90 tablet, Rfl: 0 .  mesalamine (LIALDA) 1.2 g EC tablet, Take 1 tablet (1.2 g total) by mouth 2 (two) times daily., Disp: 180 tablet, Rfl: 0 .  OneTouch Delica Lancets 51Z MISC, Use to check blood sugar up to 3 x daily, Disp: 300 each, Rfl: 2 .  ONETOUCH VERIO test strip, Check blood sugar 3 x daily, Disp: 300 each, Rfl: 5 .  quinapril (ACCUPRIL) 20 MG tablet, Take 1 tablet (20 mg total) by mouth at bedtime., Disp: 90 tablet, Rfl: 0 .  Semaglutide,0.25 or 0.5MG/DOS, (OZEMPIC, 0.25 OR 0.5 MG/DOSE,) 2 MG/1.5ML SOPN, Inject 0.5 mg into the skin once a week., Disp: , Rfl:  .  sertraline (ZOLOFT) 50 MG tablet, Take 1 tablet (50 mg total) by mouth daily., Disp: 90 tablet, Rfl: 0 .  simvastatin (ZOCOR) 40 MG tablet, Take 1 tablet (40 mg total) by mouth at bedtime., Disp: 90 tablet, Rfl: 0 .  traMADol (ULTRAM) 50 MG tablet, Take 1 tablet (50 mg total) by mouth every 6 (six) hours as needed., Disp: 30 tablet, Rfl: 1 .  traZODone (DESYREL) 50 MG tablet, Take 1 tablet (50 mg total) by mouth at bedtime., Disp: 90 tablet, Rfl: 0   Family History  Problem Relation Age of Onset  . Heart disease Mother   . Osteoporosis Mother   . Breast cancer Mother        dx with ovarian ca 1st  . Ovarian cancer Mother        4's  . Osteoporosis Sister   . Diabetes Sister   . Breast cancer Sister        early 67's no braca testing   . Cancer Brother        sinus, bone  . Osteoporosis Brother      Social History   Tobacco Use  . Smoking status: Former Smoker    Packs/day: 0.50    Years: 15.00    Pack years: 7.50    Types: Cigarettes    Quit date: 07/17/1994    Years since quitting: 25.7  . Smokeless tobacco: Former Systems developer  . Tobacco comment: stopped 18 years ago  Vaping Use  . Vaping Use: Never used  Substance Use Topics  . Alcohol use:  No  . Drug use: No    Allergies as of 04/24/2020 - Review Complete 04/24/2020  Allergen Reaction Noted  . Bactrim [sulfamethoxazole-trimethoprim] Rash 08/31/2019  Review of Systems:    All systems reviewed and negative except where noted in HPI.   Physical Exam:  BP 98/67 (BP Location: Left Arm, Patient Position: Sitting, Cuff Size: Normal)   Pulse 80   Temp 98.2 F (36.8 C) (Oral)   Ht 5' 3"  (1.6 m)   Wt 194 lb 4 oz (88.1 kg)   BMI 34.41 kg/m  No LMP recorded. Patient has had a hysterectomy.  General:   Alert,  Well-developed, well-nourished, pleasant and cooperative in NAD Head:  Normocephalic and atraumatic. Eyes:  Sclera clear, no icterus.   Conjunctiva pink. Ears:  Normal auditory acuity. Nose:  No deformity, discharge, or lesions. Mouth:  No deformity or lesions,oropharynx pink & moist. Neck:  Supple; no masses or thyromegaly. Lungs:  Respirations even and unlabored.  Clear throughout to auscultation.   No wheezes, crackles, or rhonchi. No acute distress. Heart:  Regular rate and rhythm; no murmurs, clicks, rubs, or gallops. Abdomen:  Normal bowel sounds. Soft, non-tender and grossly distended tympanic to percussion, without masses, hepatosplenomegaly or hernias noted.  No guarding or rebound tenderness.   Rectal: Not performed Msk:  Symmetrical without gross deformities. Good, equal movement & strength bilaterally. Pulses:  Normal pulses noted. Extremities:  No clubbing or edema.  No cyanosis. Neurologic:  Alert and oriented x3;  grossly normal neurologically. Skin:  Intact without significant lesions or rashes. No jaundice. Psych:  Alert and cooperative. Normal mood and affect.  Imaging Studies: No recent abdominal imaging  Assessment and Plan:   LEAHANNA BUSER is a 70 y.o. female with metabolic syndrome is seen for follow-up of chronic diarrhea with unintentional weight loss  Chronic GERD: Fairly controlled EGD is unremarkable Continue antireflux  lifestyle Continue Prilosec 20 mg daily and Pepcid as needed  Chronic diarrhea and unintentional weight loss Weight loss stabilized Stool studies are negative for infection, elevated fecal calprotectin levels, however there is no evidence of IBD based on colonoscopy, patient did not respond to mesalamine celiac disease panel, pancreatic fecal elastase levels unremarkable Video capsule endoscopy and CT abdomen and pelvis with contrast are unremarkable Empiric trial of pancreatic enzymes, samples provided.  Patient will contact me if the enzymes are helping, [facility]  Follow up as needed   Cephas Darby, MD

## 2020-04-28 ENCOUNTER — Telehealth

## 2020-04-28 ENCOUNTER — Telehealth: Payer: Self-pay | Admitting: Pharmacist

## 2020-04-28 NOTE — Telephone Encounter (Signed)
  Chronic Care Management   Outreach Note  04/28/2020 Name: Alyssa Perkins MRN: [id number] DOB: [birth year]  Referred by: [name], DO Reason for referral : No chief complaint on file.  Was unable to reach patient via telephone today and have left HIPAA compliant voicemail asking patient to return my call.    Follow Up Plan: Will collaborate with Care Guide to outreach to schedule follow up with me  [name], PharmD, [facility] [facility] [phone]

## 2020-05-01 ENCOUNTER — Telehealth: Payer: Self-pay

## 2020-05-01 NOTE — Chronic Care Management (AMB) (Signed)
  Care Management   Note  05/01/2020 Name: Alyssa Perkins MRN: [id number] DOB: 04-13-1950  Alyssa Perkins is a 70 y.o. year old female who is a primary care patient of [name], DO and is actively engaged with the care management team. I reached out to [name] by phone today to assist with re-scheduling a follow up visit with the Pharmacist  Follow up plan: Unsuccessful telephone outreach attempt made. A HIPAA compliant phone message was left for the patient providing contact information and requesting a return call.  The care management team will reach out to the patient again over the next 7 days.  If patient returns call to provider office, please advise to call [facility]  at [place], [place], [place], [place] [address] Direct Dial: [phone] [email or web address] Website: [email or web address]

## 2020-05-09 NOTE — Chronic Care Management (AMB) (Signed)
  Care Management   Note  05/09/2020 Name: Alyssa Perkins MRN: [id number] DOB: April 25, 1950  Alyssa Perkins is a 70 y.o. year old female who is a primary care patient of [name], DO and is actively engaged with the care management team. I reached out to [name] by phone today to assist with re-scheduling a follow up visit with the Pharmacist  Follow up plan: Patient declines further follow up and engagement by the care management team due to her moving out of state. Appropriate care team members and provider have been notified via electronic communication.   Noreene Larsson, [place], [place], [place] [address] Direct Dial: [phone] [email or web address] Website: [email or web address]

## 2020-05-09 NOTE — Telephone Encounter (Signed)
Patient declines r/s moving to [place]

## 2020-05-22 ENCOUNTER — Other Ambulatory Visit: Payer: Self-pay | Admitting: Family Medicine

## 2020-05-22 ENCOUNTER — Telehealth: Payer: Self-pay | Admitting: Gastroenterology

## 2020-05-22 DIAGNOSIS — E1140 Type 2 diabetes mellitus with diabetic neuropathy, unspecified: Secondary | ICD-10-CM

## 2020-05-22 DIAGNOSIS — F3289 Other specified depressive episodes: Secondary | ICD-10-CM

## 2020-05-22 DIAGNOSIS — Z794 Long term (current) use of insulin: Secondary | ICD-10-CM

## 2020-05-22 MED ORDER — TRAZODONE HCL 50 MG PO TABS: 50.0000 mg | ORAL_TABLET | Freq: Every day | ORAL | 1 refills | Status: AC

## 2020-05-22 MED ORDER — JARDIANCE 25 MG PO TABS: 25.0000 mg | ORAL_TABLET | Freq: Every day | ORAL | 1 refills | Status: AC

## 2020-05-22 NOTE — Telephone Encounter (Signed)
We do not fill this medication because it is for diabetes. Informed patient to call PCP to get them to refill this. She states she will call them

## 2020-05-22 NOTE — Telephone Encounter (Signed)
Copied from [employer] [id number]. Topic: Quick Communication - Rx Refill/Question >> May 22, 2020  4:40 PM Lenon Curt, [place] A wrote: Medication: JARDIANCE 25 MG TABS tablet  traZODone (DESYREL) 50 MG tablet   Has the patient contacted their pharmacy? Patient has contacted pharmacy and been redirected to contact PCP  Preferred Pharmacy (with phone number or street name): [facility], [address]  Phone:  [phone]  Agent: Please be advised that RX refills may take up to 3 business days. We ask that you follow-up with your pharmacy.

## 2020-05-22 NOTE — Telephone Encounter (Signed)
JARDIANCE 25 MG TABS tablet  [facility]  90 day refill

## 2020-05-22 NOTE — Telephone Encounter (Signed)
Requested Prescriptions  Pending Prescriptions Disp Refills  . JARDIANCE 25 MG TABS tablet 90 tablet 1    Sig: Take 1 tablet (25 mg total) by mouth daily.     Endocrinology:  Diabetes - SGLT2 Inhibitors Failed - 05/22/2020  5:24 PM      Failed - Cr in normal range and within 360 days    Creat  Date Value Ref Range Status  10/15/2016 0.87 0.50 - 0.99 mg/dL Final    Comment:      For patients > or = 70 years of age: The upper reference limit for Creatinine is approximately 13% higher for people identified as African-American.      Creatinine, Ser  Date Value Ref Range Status  11/29/2019 1.10 (H) 0.44 - 1.00 mg/dL Final         Failed - LDL in normal range and within 360 days    Ldl Cholesterol, Calc  Date Value Ref Range Status  02/26/2013 49 0 - 100 mg/dL Final   LDL Cholesterol  Date Value Ref Range Status  10/15/2016 62 <100 mg/dL Final         Failed - HBA1C is between 0 and 7.9 and within 180 days    Hemoglobin A1C  Date Value Ref Range Status  10/14/2019 7.3  Final    Comment:    [facility]         Failed - eGFR in normal range and within 360 days    GFR, Est African American  Date Value Ref Range Status  10/15/2016 81 >=60 mL/min Final   GFR calc Af Amer  Date Value Ref Range Status  01/26/2019 >60 >60 mL/min Final   GFR, Est Non African American  Date Value Ref Range Status  10/15/2016 70 >=60 mL/min Final   GFR calc non Af Amer  Date Value Ref Range Status  01/26/2019 >60 >60 mL/min Final         Passed - Valid encounter within last 6 months    Recent Outpatient Visits          2 months ago Type 2 diabetes mellitus with diabetic neuropathy, with long-term current use of insulin ([place])   [facility] [name], DO   9 months ago Cutaneous abscess of right axilla   [facility], Lupita Raider, FNP   1 year ago Type 2 diabetes mellitus with diabetic neuropathy, with long-term current use of  insulin [facility])   [facility] [name], DO   1 year ago Suspected COVID-19 virus infection   [place], Devonne Doughty, DO   1 year ago Uncontrolled type 2 diabetes mellitus with diabetic neuropathy, with long-term current use of insulin [facility])   [facility], Devonne Doughty, DO             . traZODone (DESYREL) 50 MG tablet 90 tablet 1    Sig: Take 1 tablet (50 mg total) by mouth at bedtime.     Psychiatry: Antidepressants - Serotonin Modulator Passed - 05/22/2020  5:24 PM      Passed - Completed PHQ-2 or PHQ-9 in the last 360 days      Passed - Valid encounter within last 6 months    Recent Outpatient Visits          2 months ago Type 2 diabetes mellitus with diabetic neuropathy, with long-term current use of insulin ([place])   [facility]  [name], DO   9 months ago Cutaneous abscess of right axilla   [facility], Lupita Raider, FNP   1 year ago Type 2 diabetes mellitus with diabetic neuropathy, with long-term current use of insulin [facility])   [place], DO   1 year ago Suspected COVID-19 virus infection   [place], DO   1 year ago Uncontrolled type 2 diabetes mellitus with diabetic neuropathy, with long-term current use of insulin [facility])   [facility] [place], Devonne Doughty, DO

## 2020-05-30 ENCOUNTER — Other Ambulatory Visit: Payer: Self-pay | Admitting: Family Medicine

## 2020-05-30 DIAGNOSIS — I10 Essential (primary) hypertension: Secondary | ICD-10-CM

## 2020-05-30 DIAGNOSIS — F3289 Other specified depressive episodes: Secondary | ICD-10-CM

## 2020-06-01 ENCOUNTER — Other Ambulatory Visit: Payer: Self-pay | Admitting: Family Medicine

## 2020-06-01 DIAGNOSIS — G5603 Carpal tunnel syndrome, bilateral upper limbs: Secondary | ICD-10-CM

## 2020-06-01 DIAGNOSIS — E1169 Type 2 diabetes mellitus with other specified complication: Secondary | ICD-10-CM

## 2020-06-01 DIAGNOSIS — E1140 Type 2 diabetes mellitus with diabetic neuropathy, unspecified: Secondary | ICD-10-CM

## 2020-06-01 DIAGNOSIS — L299 Pruritus, unspecified: Secondary | ICD-10-CM

## 2020-06-01 DIAGNOSIS — I10 Essential (primary) hypertension: Secondary | ICD-10-CM

## 2020-06-01 DIAGNOSIS — F3289 Other specified depressive episodes: Secondary | ICD-10-CM

## 2020-06-01 DIAGNOSIS — E785 Hyperlipidemia, unspecified: Secondary | ICD-10-CM

## 2020-06-01 DIAGNOSIS — M5412 Radiculopathy, cervical region: Secondary | ICD-10-CM

## 2020-06-01 MED ORDER — HYDROXYZINE HCL 25 MG PO TABS: 25.0000 mg | ORAL_TABLET | Freq: Every day | ORAL | 0 refills | Status: AC | PRN

## 2020-06-01 MED ORDER — GABAPENTIN 100 MG PO CAPS: 200.0000 mg | ORAL_CAPSULE | Freq: Three times a day (TID) | ORAL | 2 refills | Status: AC

## 2020-06-01 NOTE — Telephone Encounter (Signed)
Requested medications are due for refill today yes  Requested medications are on the active medication list yes  Last visit 1/4  Future visit scheduled no  Notes to clinic failed protocol due to labs are greater than 21 days old.

## 2020-06-01 NOTE — Telephone Encounter (Signed)
Requested medications are due for refill today yes  Requested medications are on the active medication list yes  Last visit 1/4  Future visit scheduled no  Notes to clinic Failed protocol due to valid labs older than 360 days.

## 2020-06-01 NOTE — Telephone Encounter (Signed)
Medication: hydrOXYzine (ATARAX/VISTARIL) 25 MG tablet [[id number]] , quinapril (ACCUPRIL) 20 MG tablet [[id number]] , simvastatin (ZOCOR) 40 MG tablet [[id number]] , sertraline (ZOLOFT) 50 MG tablet [[id number]] , gabapentin (NEURONTIN) 100 MG capsule [[id number]] , JARDIANCE 25 MG TABS tablet [[id number]]   Requesting a 90 day script on these medications. Patient declined scheduling appt for Zoloft refill. States she will call back when she returns from visiting her daughter.  Has the patient contacted their pharmacy? YES (Agent: If no, request that the patient contact the pharmacy for the refill.) (Agent: If yes, when and what did the pharmacy advise?)  Preferred Pharmacy (with phone number or street name): [place], [address] [address] Phone: [phone] Fax: [phone] Hours: Not open 24 hours    Agent: Please be advised that RX refills may take up to 3 business days. We ask that you follow-up with your pharmacy.

## 2020-08-11 ENCOUNTER — Telehealth: Payer: Self-pay | Admitting: Family Medicine

## 2020-08-11 NOTE — Telephone Encounter (Signed)
Copied from [place] [phone]. Topic: Medicare AWV >> Aug 11, 2020  1:14 PM Cher Nakai R wrote: Reason for CRM:  No answer unable to leave a message for patient to call back and schedule the Medicare Annual Wellness Visit (AWV) virtually or by telephone.  Last AWV 07/20/2019  Please schedule at anytime with [facility].  40 minute appointment  Any questions, please call me at [phone]

## 2020-10-11 ENCOUNTER — Other Ambulatory Visit: Payer: Self-pay | Admitting: Family Medicine

## 2020-10-11 DIAGNOSIS — M5412 Radiculopathy, cervical region: Secondary | ICD-10-CM

## 2020-10-11 DIAGNOSIS — G44221 Chronic tension-type headache, intractable: Secondary | ICD-10-CM

## 2020-10-11 DIAGNOSIS — M545 Low back pain, unspecified: Secondary | ICD-10-CM

## 2020-10-11 DIAGNOSIS — G8929 Other chronic pain: Secondary | ICD-10-CM

## 2020-10-11 NOTE — Telephone Encounter (Signed)
Requested medication (s) are due for refill today: no  Requested medication (s) are on the active medication list: yes   Last refill:  03/21/2020  Future visit scheduled: no  Notes to clinic:  this refill cannot be delegated    Requested Prescriptions  Pending Prescriptions Disp Refills   traMADol (ULTRAM) 50 MG tablet [Pharmacy Med Name: traMADol HCl 50 MG Oral Tablet] 30 tablet 0    Sig: TAKE 1 TABLET BY MOUTH EVERY 6 HOURS AS NEEDED      Not Delegated - Analgesics:  Opioid Agonists Failed - 10/11/2020 10:29 AM      Failed - This refill cannot be delegated      Failed - Urine Drug Screen completed in last 360 days      Failed - Valid encounter within last 6 months    Recent Outpatient Visits           6 months ago Type 2 diabetes mellitus with diabetic neuropathy, with long-term current use of insulin ([place])   [facility] [name], DO   1 year ago Cutaneous abscess of right axilla   [facility], Lupita Raider, FNP   1 year ago Type 2 diabetes mellitus with diabetic neuropathy, with long-term current use of insulin [facility])   [facility] [name], DO   1 year ago Suspected COVID-19 virus infection   [place], DO   1 year ago Uncontrolled type 2 diabetes mellitus with diabetic neuropathy, with long-term current use of insulin [facility])   [facility] [place], Devonne Doughty, DO

## 2020-12-15 ENCOUNTER — Telehealth: Payer: Self-pay | Admitting: Family Medicine

## 2020-12-15 NOTE — Telephone Encounter (Signed)
Left message for patient to call back and schedule the Medicare Annual Wellness Visit (AWV) virtually or by telephone.  Last AWV 07/20/19  Please schedule at anytime with [place].  40 minute appointment  Any questions, please call me at [phone]
